# Patient Record
Sex: Male | Born: 1975 | Race: Black or African American | Hispanic: No | Marital: Married
Health system: Southern US, Academic
[De-identification: ages and names within clinical notes are randomized; demographics above are authoritative.]

## PROBLEM LIST (undated history)

## (undated) ENCOUNTER — Encounter

## (undated) ENCOUNTER — Encounter: Attending: Nephrology | Primary: Nephrology

## (undated) ENCOUNTER — Ambulatory Visit

## (undated) ENCOUNTER — Telehealth

## (undated) ENCOUNTER — Ambulatory Visit: Payer: MEDICARE | Attending: Nephrology | Primary: Nephrology

## (undated) ENCOUNTER — Ambulatory Visit: Payer: MEDICARE

## (undated) ENCOUNTER — Encounter: Payer: MEDICARE | Attending: Nephrology | Primary: Nephrology

## (undated) ENCOUNTER — Ambulatory Visit
Payer: Medicare (Managed Care) | Attending: Student in an Organized Health Care Education/Training Program | Primary: Student in an Organized Health Care Education/Training Program

## (undated) ENCOUNTER — Ambulatory Visit: Payer: Medicare (Managed Care)

## (undated) ENCOUNTER — Ambulatory Visit: Payer: Medicare (Managed Care) | Attending: Nephrology | Primary: Nephrology

## (undated) DIAGNOSIS — E119 Type 2 diabetes mellitus without complications: Secondary | ICD-10-CM

## (undated) DIAGNOSIS — H409 Unspecified glaucoma: Secondary | ICD-10-CM

## (undated) DIAGNOSIS — K219 Gastro-esophageal reflux disease without esophagitis: Secondary | ICD-10-CM

## (undated) DIAGNOSIS — I2510 Atherosclerotic heart disease of native coronary artery without angina pectoris: Secondary | ICD-10-CM

## (undated) DIAGNOSIS — N189 Chronic kidney disease, unspecified: Secondary | ICD-10-CM

## (undated) DIAGNOSIS — I10 Essential (primary) hypertension: Secondary | ICD-10-CM

## (undated) DIAGNOSIS — E663 Overweight: Secondary | ICD-10-CM

## (undated) DIAGNOSIS — H547 Unspecified visual loss: Secondary | ICD-10-CM

## (undated) HISTORY — DX: Atherosclerotic heart disease of native coronary artery without angina pectoris: I25.10

## (undated) HISTORY — DX: Chronic kidney disease, unspecified: N18.9

## (undated) HISTORY — DX: Unspecified visual loss: H54.7

## (undated) HISTORY — DX: Overweight: E66.3

## (undated) HISTORY — DX: Gastro-esophageal reflux disease without esophagitis: K21.9

## (undated) HISTORY — PX: EYE SURGERY: SHX253

## (undated) HISTORY — DX: Essential (primary) hypertension: I10

## (undated) HISTORY — DX: Unspecified glaucoma: H40.9

## (undated) HISTORY — DX: Type 2 diabetes mellitus without complications: E11.9

## (undated) HISTORY — PX: CARDIAC CATHETERIZATION: SHX172

---

## 2009-12-25 DIAGNOSIS — I10 Essential (primary) hypertension: Secondary | ICD-10-CM | POA: Insufficient documentation

## 2011-01-05 DIAGNOSIS — E119 Type 2 diabetes mellitus without complications: Secondary | ICD-10-CM | POA: Insufficient documentation

## 2011-08-16 DIAGNOSIS — R946 Abnormal results of thyroid function studies: Secondary | ICD-10-CM | POA: Insufficient documentation

## 2011-08-16 HISTORY — DX: Abnormal results of thyroid function studies: R94.6

## 2014-04-19 DIAGNOSIS — N2581 Secondary hyperparathyroidism of renal origin: Secondary | ICD-10-CM | POA: Diagnosis not present

## 2014-04-19 DIAGNOSIS — N186 End stage renal disease: Secondary | ICD-10-CM | POA: Diagnosis not present

## 2014-04-19 DIAGNOSIS — D631 Anemia in chronic kidney disease: Secondary | ICD-10-CM | POA: Diagnosis not present

## 2014-04-19 DIAGNOSIS — D509 Iron deficiency anemia, unspecified: Secondary | ICD-10-CM | POA: Diagnosis not present

## 2014-04-19 DIAGNOSIS — Z992 Dependence on renal dialysis: Secondary | ICD-10-CM | POA: Diagnosis not present

## 2014-04-22 DIAGNOSIS — D631 Anemia in chronic kidney disease: Secondary | ICD-10-CM | POA: Diagnosis not present

## 2014-04-22 DIAGNOSIS — N2581 Secondary hyperparathyroidism of renal origin: Secondary | ICD-10-CM | POA: Diagnosis not present

## 2014-04-22 DIAGNOSIS — D509 Iron deficiency anemia, unspecified: Secondary | ICD-10-CM | POA: Diagnosis not present

## 2014-04-22 DIAGNOSIS — N186 End stage renal disease: Secondary | ICD-10-CM | POA: Diagnosis not present

## 2014-04-22 DIAGNOSIS — Z992 Dependence on renal dialysis: Secondary | ICD-10-CM | POA: Diagnosis not present

## 2014-04-24 DIAGNOSIS — N2581 Secondary hyperparathyroidism of renal origin: Secondary | ICD-10-CM | POA: Diagnosis not present

## 2014-04-24 DIAGNOSIS — Z992 Dependence on renal dialysis: Secondary | ICD-10-CM | POA: Diagnosis not present

## 2014-04-24 DIAGNOSIS — D631 Anemia in chronic kidney disease: Secondary | ICD-10-CM | POA: Diagnosis not present

## 2014-04-24 DIAGNOSIS — N186 End stage renal disease: Secondary | ICD-10-CM | POA: Diagnosis not present

## 2014-04-24 DIAGNOSIS — D509 Iron deficiency anemia, unspecified: Secondary | ICD-10-CM | POA: Diagnosis not present

## 2014-04-26 DIAGNOSIS — D509 Iron deficiency anemia, unspecified: Secondary | ICD-10-CM | POA: Diagnosis not present

## 2014-04-26 DIAGNOSIS — Z992 Dependence on renal dialysis: Secondary | ICD-10-CM | POA: Diagnosis not present

## 2014-04-26 DIAGNOSIS — D631 Anemia in chronic kidney disease: Secondary | ICD-10-CM | POA: Diagnosis not present

## 2014-04-26 DIAGNOSIS — N186 End stage renal disease: Secondary | ICD-10-CM | POA: Diagnosis not present

## 2014-04-26 DIAGNOSIS — N2581 Secondary hyperparathyroidism of renal origin: Secondary | ICD-10-CM | POA: Diagnosis not present

## 2014-04-29 DIAGNOSIS — N186 End stage renal disease: Secondary | ICD-10-CM | POA: Diagnosis not present

## 2014-04-29 DIAGNOSIS — D509 Iron deficiency anemia, unspecified: Secondary | ICD-10-CM | POA: Diagnosis not present

## 2014-04-29 DIAGNOSIS — D631 Anemia in chronic kidney disease: Secondary | ICD-10-CM | POA: Diagnosis not present

## 2014-04-29 DIAGNOSIS — Z992 Dependence on renal dialysis: Secondary | ICD-10-CM | POA: Diagnosis not present

## 2014-04-29 DIAGNOSIS — N2581 Secondary hyperparathyroidism of renal origin: Secondary | ICD-10-CM | POA: Diagnosis not present

## 2014-05-01 DIAGNOSIS — N2581 Secondary hyperparathyroidism of renal origin: Secondary | ICD-10-CM | POA: Diagnosis not present

## 2014-05-01 DIAGNOSIS — Z992 Dependence on renal dialysis: Secondary | ICD-10-CM | POA: Diagnosis not present

## 2014-05-01 DIAGNOSIS — N186 End stage renal disease: Secondary | ICD-10-CM | POA: Diagnosis not present

## 2014-05-01 DIAGNOSIS — D631 Anemia in chronic kidney disease: Secondary | ICD-10-CM | POA: Diagnosis not present

## 2014-05-01 DIAGNOSIS — D509 Iron deficiency anemia, unspecified: Secondary | ICD-10-CM | POA: Diagnosis not present

## 2014-05-03 DIAGNOSIS — D631 Anemia in chronic kidney disease: Secondary | ICD-10-CM | POA: Diagnosis not present

## 2014-05-03 DIAGNOSIS — D509 Iron deficiency anemia, unspecified: Secondary | ICD-10-CM | POA: Diagnosis not present

## 2014-05-03 DIAGNOSIS — N2581 Secondary hyperparathyroidism of renal origin: Secondary | ICD-10-CM | POA: Diagnosis not present

## 2014-05-03 DIAGNOSIS — N186 End stage renal disease: Secondary | ICD-10-CM | POA: Diagnosis not present

## 2014-05-03 DIAGNOSIS — Z992 Dependence on renal dialysis: Secondary | ICD-10-CM | POA: Diagnosis not present

## 2014-05-06 DIAGNOSIS — D631 Anemia in chronic kidney disease: Secondary | ICD-10-CM | POA: Diagnosis not present

## 2014-05-06 DIAGNOSIS — N2581 Secondary hyperparathyroidism of renal origin: Secondary | ICD-10-CM | POA: Diagnosis not present

## 2014-05-06 DIAGNOSIS — Z992 Dependence on renal dialysis: Secondary | ICD-10-CM | POA: Diagnosis not present

## 2014-05-06 DIAGNOSIS — N186 End stage renal disease: Secondary | ICD-10-CM | POA: Diagnosis not present

## 2014-05-06 DIAGNOSIS — D509 Iron deficiency anemia, unspecified: Secondary | ICD-10-CM | POA: Diagnosis not present

## 2014-05-10 DIAGNOSIS — D631 Anemia in chronic kidney disease: Secondary | ICD-10-CM | POA: Diagnosis not present

## 2014-05-10 DIAGNOSIS — D509 Iron deficiency anemia, unspecified: Secondary | ICD-10-CM | POA: Diagnosis not present

## 2014-05-10 DIAGNOSIS — N186 End stage renal disease: Secondary | ICD-10-CM | POA: Diagnosis not present

## 2014-05-10 DIAGNOSIS — Z992 Dependence on renal dialysis: Secondary | ICD-10-CM | POA: Diagnosis not present

## 2014-05-10 DIAGNOSIS — N2581 Secondary hyperparathyroidism of renal origin: Secondary | ICD-10-CM | POA: Diagnosis not present

## 2014-05-13 DIAGNOSIS — N2581 Secondary hyperparathyroidism of renal origin: Secondary | ICD-10-CM | POA: Diagnosis not present

## 2014-05-13 DIAGNOSIS — D509 Iron deficiency anemia, unspecified: Secondary | ICD-10-CM | POA: Diagnosis not present

## 2014-05-13 DIAGNOSIS — D631 Anemia in chronic kidney disease: Secondary | ICD-10-CM | POA: Diagnosis not present

## 2014-05-13 DIAGNOSIS — N186 End stage renal disease: Secondary | ICD-10-CM | POA: Diagnosis not present

## 2014-05-13 DIAGNOSIS — Z992 Dependence on renal dialysis: Secondary | ICD-10-CM | POA: Diagnosis not present

## 2014-05-14 DIAGNOSIS — H25042 Posterior subcapsular polar age-related cataract, left eye: Secondary | ICD-10-CM | POA: Diagnosis not present

## 2014-05-14 DIAGNOSIS — E10349 Type 1 diabetes mellitus with severe nonproliferative diabetic retinopathy without macular edema: Secondary | ICD-10-CM | POA: Diagnosis not present

## 2014-05-14 DIAGNOSIS — H1789 Other corneal scars and opacities: Secondary | ICD-10-CM | POA: Diagnosis not present

## 2014-05-15 DIAGNOSIS — N186 End stage renal disease: Secondary | ICD-10-CM | POA: Diagnosis not present

## 2014-05-15 DIAGNOSIS — Z992 Dependence on renal dialysis: Secondary | ICD-10-CM | POA: Diagnosis not present

## 2014-05-15 DIAGNOSIS — N2581 Secondary hyperparathyroidism of renal origin: Secondary | ICD-10-CM | POA: Diagnosis not present

## 2014-05-15 DIAGNOSIS — D509 Iron deficiency anemia, unspecified: Secondary | ICD-10-CM | POA: Diagnosis not present

## 2014-05-15 DIAGNOSIS — D631 Anemia in chronic kidney disease: Secondary | ICD-10-CM | POA: Diagnosis not present

## 2014-05-17 DIAGNOSIS — D631 Anemia in chronic kidney disease: Secondary | ICD-10-CM | POA: Diagnosis not present

## 2014-05-17 DIAGNOSIS — D509 Iron deficiency anemia, unspecified: Secondary | ICD-10-CM | POA: Diagnosis not present

## 2014-05-17 DIAGNOSIS — N186 End stage renal disease: Secondary | ICD-10-CM | POA: Diagnosis not present

## 2014-05-17 DIAGNOSIS — N2581 Secondary hyperparathyroidism of renal origin: Secondary | ICD-10-CM | POA: Diagnosis not present

## 2014-05-17 DIAGNOSIS — Z992 Dependence on renal dialysis: Secondary | ICD-10-CM | POA: Diagnosis not present

## 2014-05-20 DIAGNOSIS — N2581 Secondary hyperparathyroidism of renal origin: Secondary | ICD-10-CM | POA: Diagnosis not present

## 2014-05-20 DIAGNOSIS — D509 Iron deficiency anemia, unspecified: Secondary | ICD-10-CM | POA: Diagnosis not present

## 2014-05-20 DIAGNOSIS — E11359 Type 2 diabetes mellitus with proliferative diabetic retinopathy without macular edema: Secondary | ICD-10-CM | POA: Diagnosis not present

## 2014-05-20 DIAGNOSIS — H44521 Atrophy of globe, right eye: Secondary | ICD-10-CM | POA: Diagnosis not present

## 2014-05-20 DIAGNOSIS — D631 Anemia in chronic kidney disease: Secondary | ICD-10-CM | POA: Diagnosis not present

## 2014-05-20 DIAGNOSIS — Z992 Dependence on renal dialysis: Secondary | ICD-10-CM | POA: Diagnosis not present

## 2014-05-20 DIAGNOSIS — N186 End stage renal disease: Secondary | ICD-10-CM | POA: Diagnosis not present

## 2014-05-20 DIAGNOSIS — H3342 Traction detachment of retina, left eye: Secondary | ICD-10-CM | POA: Diagnosis not present

## 2014-05-22 DIAGNOSIS — Z992 Dependence on renal dialysis: Secondary | ICD-10-CM | POA: Diagnosis not present

## 2014-05-22 DIAGNOSIS — N186 End stage renal disease: Secondary | ICD-10-CM | POA: Diagnosis not present

## 2014-05-22 DIAGNOSIS — D509 Iron deficiency anemia, unspecified: Secondary | ICD-10-CM | POA: Diagnosis not present

## 2014-05-22 DIAGNOSIS — N2581 Secondary hyperparathyroidism of renal origin: Secondary | ICD-10-CM | POA: Diagnosis not present

## 2014-05-22 DIAGNOSIS — D631 Anemia in chronic kidney disease: Secondary | ICD-10-CM | POA: Diagnosis not present

## 2014-05-24 DIAGNOSIS — N186 End stage renal disease: Secondary | ICD-10-CM | POA: Diagnosis not present

## 2014-05-24 DIAGNOSIS — Z992 Dependence on renal dialysis: Secondary | ICD-10-CM | POA: Diagnosis not present

## 2014-05-24 DIAGNOSIS — D631 Anemia in chronic kidney disease: Secondary | ICD-10-CM | POA: Diagnosis not present

## 2014-05-24 DIAGNOSIS — N2581 Secondary hyperparathyroidism of renal origin: Secondary | ICD-10-CM | POA: Diagnosis not present

## 2014-05-24 DIAGNOSIS — D509 Iron deficiency anemia, unspecified: Secondary | ICD-10-CM | POA: Diagnosis not present

## 2014-05-27 DIAGNOSIS — N186 End stage renal disease: Secondary | ICD-10-CM | POA: Diagnosis not present

## 2014-05-27 DIAGNOSIS — Z992 Dependence on renal dialysis: Secondary | ICD-10-CM | POA: Diagnosis not present

## 2014-05-27 DIAGNOSIS — N2581 Secondary hyperparathyroidism of renal origin: Secondary | ICD-10-CM | POA: Diagnosis not present

## 2014-05-27 DIAGNOSIS — D631 Anemia in chronic kidney disease: Secondary | ICD-10-CM | POA: Diagnosis not present

## 2014-05-27 DIAGNOSIS — D509 Iron deficiency anemia, unspecified: Secondary | ICD-10-CM | POA: Diagnosis not present

## 2014-05-29 DIAGNOSIS — N186 End stage renal disease: Secondary | ICD-10-CM | POA: Diagnosis not present

## 2014-05-29 DIAGNOSIS — Z992 Dependence on renal dialysis: Secondary | ICD-10-CM | POA: Diagnosis not present

## 2014-05-29 DIAGNOSIS — D509 Iron deficiency anemia, unspecified: Secondary | ICD-10-CM | POA: Diagnosis not present

## 2014-05-29 DIAGNOSIS — D631 Anemia in chronic kidney disease: Secondary | ICD-10-CM | POA: Diagnosis not present

## 2014-05-29 DIAGNOSIS — N2581 Secondary hyperparathyroidism of renal origin: Secondary | ICD-10-CM | POA: Diagnosis not present

## 2014-05-31 DIAGNOSIS — N2581 Secondary hyperparathyroidism of renal origin: Secondary | ICD-10-CM | POA: Diagnosis not present

## 2014-05-31 DIAGNOSIS — N186 End stage renal disease: Secondary | ICD-10-CM | POA: Diagnosis not present

## 2014-05-31 DIAGNOSIS — D509 Iron deficiency anemia, unspecified: Secondary | ICD-10-CM | POA: Diagnosis not present

## 2014-05-31 DIAGNOSIS — Z992 Dependence on renal dialysis: Secondary | ICD-10-CM | POA: Diagnosis not present

## 2014-05-31 DIAGNOSIS — D631 Anemia in chronic kidney disease: Secondary | ICD-10-CM | POA: Diagnosis not present

## 2014-06-03 DIAGNOSIS — Z992 Dependence on renal dialysis: Secondary | ICD-10-CM | POA: Diagnosis not present

## 2014-06-03 DIAGNOSIS — D509 Iron deficiency anemia, unspecified: Secondary | ICD-10-CM | POA: Diagnosis not present

## 2014-06-03 DIAGNOSIS — N186 End stage renal disease: Secondary | ICD-10-CM | POA: Diagnosis not present

## 2014-06-03 DIAGNOSIS — D631 Anemia in chronic kidney disease: Secondary | ICD-10-CM | POA: Diagnosis not present

## 2014-06-03 DIAGNOSIS — N2581 Secondary hyperparathyroidism of renal origin: Secondary | ICD-10-CM | POA: Diagnosis not present

## 2014-06-05 DIAGNOSIS — Z992 Dependence on renal dialysis: Secondary | ICD-10-CM | POA: Diagnosis not present

## 2014-06-05 DIAGNOSIS — D509 Iron deficiency anemia, unspecified: Secondary | ICD-10-CM | POA: Diagnosis not present

## 2014-06-05 DIAGNOSIS — N186 End stage renal disease: Secondary | ICD-10-CM | POA: Diagnosis not present

## 2014-06-05 DIAGNOSIS — N2581 Secondary hyperparathyroidism of renal origin: Secondary | ICD-10-CM | POA: Diagnosis not present

## 2014-06-05 DIAGNOSIS — D631 Anemia in chronic kidney disease: Secondary | ICD-10-CM | POA: Diagnosis not present

## 2014-06-06 DIAGNOSIS — N186 End stage renal disease: Secondary | ICD-10-CM | POA: Diagnosis not present

## 2014-06-06 DIAGNOSIS — E119 Type 2 diabetes mellitus without complications: Secondary | ICD-10-CM | POA: Diagnosis not present

## 2014-06-06 DIAGNOSIS — I2510 Atherosclerotic heart disease of native coronary artery without angina pectoris: Secondary | ICD-10-CM | POA: Diagnosis not present

## 2014-06-06 DIAGNOSIS — Z1159 Encounter for screening for other viral diseases: Secondary | ICD-10-CM | POA: Diagnosis not present

## 2014-06-06 DIAGNOSIS — I10 Essential (primary) hypertension: Secondary | ICD-10-CM | POA: Diagnosis not present

## 2014-06-07 DIAGNOSIS — Z992 Dependence on renal dialysis: Secondary | ICD-10-CM | POA: Diagnosis not present

## 2014-06-07 DIAGNOSIS — D631 Anemia in chronic kidney disease: Secondary | ICD-10-CM | POA: Diagnosis not present

## 2014-06-07 DIAGNOSIS — N186 End stage renal disease: Secondary | ICD-10-CM | POA: Diagnosis not present

## 2014-06-07 DIAGNOSIS — N2581 Secondary hyperparathyroidism of renal origin: Secondary | ICD-10-CM | POA: Diagnosis not present

## 2014-06-07 DIAGNOSIS — D509 Iron deficiency anemia, unspecified: Secondary | ICD-10-CM | POA: Diagnosis not present

## 2014-06-10 DIAGNOSIS — D509 Iron deficiency anemia, unspecified: Secondary | ICD-10-CM | POA: Diagnosis not present

## 2014-06-10 DIAGNOSIS — D631 Anemia in chronic kidney disease: Secondary | ICD-10-CM | POA: Diagnosis not present

## 2014-06-10 DIAGNOSIS — N2581 Secondary hyperparathyroidism of renal origin: Secondary | ICD-10-CM | POA: Diagnosis not present

## 2014-06-10 DIAGNOSIS — Z992 Dependence on renal dialysis: Secondary | ICD-10-CM | POA: Diagnosis not present

## 2014-06-10 DIAGNOSIS — N186 End stage renal disease: Secondary | ICD-10-CM | POA: Diagnosis not present

## 2014-06-12 DIAGNOSIS — Z992 Dependence on renal dialysis: Secondary | ICD-10-CM | POA: Diagnosis not present

## 2014-06-12 DIAGNOSIS — D509 Iron deficiency anemia, unspecified: Secondary | ICD-10-CM | POA: Diagnosis not present

## 2014-06-12 DIAGNOSIS — N186 End stage renal disease: Secondary | ICD-10-CM | POA: Diagnosis not present

## 2014-06-12 DIAGNOSIS — D631 Anemia in chronic kidney disease: Secondary | ICD-10-CM | POA: Diagnosis not present

## 2014-06-12 DIAGNOSIS — N2581 Secondary hyperparathyroidism of renal origin: Secondary | ICD-10-CM | POA: Diagnosis not present

## 2014-06-14 DIAGNOSIS — Z992 Dependence on renal dialysis: Secondary | ICD-10-CM | POA: Diagnosis not present

## 2014-06-14 DIAGNOSIS — D631 Anemia in chronic kidney disease: Secondary | ICD-10-CM | POA: Diagnosis not present

## 2014-06-14 DIAGNOSIS — D509 Iron deficiency anemia, unspecified: Secondary | ICD-10-CM | POA: Diagnosis not present

## 2014-06-14 DIAGNOSIS — N2581 Secondary hyperparathyroidism of renal origin: Secondary | ICD-10-CM | POA: Diagnosis not present

## 2014-06-14 DIAGNOSIS — N186 End stage renal disease: Secondary | ICD-10-CM | POA: Diagnosis not present

## 2014-06-17 DIAGNOSIS — N2581 Secondary hyperparathyroidism of renal origin: Secondary | ICD-10-CM | POA: Diagnosis not present

## 2014-06-17 DIAGNOSIS — D631 Anemia in chronic kidney disease: Secondary | ICD-10-CM | POA: Diagnosis not present

## 2014-06-17 DIAGNOSIS — Z992 Dependence on renal dialysis: Secondary | ICD-10-CM | POA: Diagnosis not present

## 2014-06-17 DIAGNOSIS — D509 Iron deficiency anemia, unspecified: Secondary | ICD-10-CM | POA: Diagnosis not present

## 2014-06-17 DIAGNOSIS — N186 End stage renal disease: Secondary | ICD-10-CM | POA: Diagnosis not present

## 2014-06-18 DIAGNOSIS — Z992 Dependence on renal dialysis: Secondary | ICD-10-CM | POA: Diagnosis not present

## 2014-06-18 DIAGNOSIS — N186 End stage renal disease: Secondary | ICD-10-CM | POA: Diagnosis not present

## 2014-06-19 DIAGNOSIS — Z992 Dependence on renal dialysis: Secondary | ICD-10-CM | POA: Diagnosis not present

## 2014-06-19 DIAGNOSIS — N2581 Secondary hyperparathyroidism of renal origin: Secondary | ICD-10-CM | POA: Diagnosis not present

## 2014-06-19 DIAGNOSIS — D631 Anemia in chronic kidney disease: Secondary | ICD-10-CM | POA: Diagnosis not present

## 2014-06-19 DIAGNOSIS — N186 End stage renal disease: Secondary | ICD-10-CM | POA: Diagnosis not present

## 2014-06-21 DIAGNOSIS — D631 Anemia in chronic kidney disease: Secondary | ICD-10-CM | POA: Diagnosis not present

## 2014-06-21 DIAGNOSIS — N2581 Secondary hyperparathyroidism of renal origin: Secondary | ICD-10-CM | POA: Diagnosis not present

## 2014-06-21 DIAGNOSIS — N186 End stage renal disease: Secondary | ICD-10-CM | POA: Diagnosis not present

## 2014-06-21 DIAGNOSIS — Z992 Dependence on renal dialysis: Secondary | ICD-10-CM | POA: Diagnosis not present

## 2014-06-24 DIAGNOSIS — D631 Anemia in chronic kidney disease: Secondary | ICD-10-CM | POA: Diagnosis not present

## 2014-06-24 DIAGNOSIS — N2581 Secondary hyperparathyroidism of renal origin: Secondary | ICD-10-CM | POA: Diagnosis not present

## 2014-06-24 DIAGNOSIS — Z992 Dependence on renal dialysis: Secondary | ICD-10-CM | POA: Diagnosis not present

## 2014-06-24 DIAGNOSIS — N186 End stage renal disease: Secondary | ICD-10-CM | POA: Diagnosis not present

## 2014-06-26 DIAGNOSIS — D631 Anemia in chronic kidney disease: Secondary | ICD-10-CM | POA: Diagnosis not present

## 2014-06-26 DIAGNOSIS — Z992 Dependence on renal dialysis: Secondary | ICD-10-CM | POA: Diagnosis not present

## 2014-06-26 DIAGNOSIS — N2581 Secondary hyperparathyroidism of renal origin: Secondary | ICD-10-CM | POA: Diagnosis not present

## 2014-06-26 DIAGNOSIS — N186 End stage renal disease: Secondary | ICD-10-CM | POA: Diagnosis not present

## 2014-06-28 DIAGNOSIS — D631 Anemia in chronic kidney disease: Secondary | ICD-10-CM | POA: Diagnosis not present

## 2014-06-28 DIAGNOSIS — N186 End stage renal disease: Secondary | ICD-10-CM | POA: Diagnosis not present

## 2014-06-28 DIAGNOSIS — Z992 Dependence on renal dialysis: Secondary | ICD-10-CM | POA: Diagnosis not present

## 2014-06-28 DIAGNOSIS — N2581 Secondary hyperparathyroidism of renal origin: Secondary | ICD-10-CM | POA: Diagnosis not present

## 2014-07-01 DIAGNOSIS — N186 End stage renal disease: Secondary | ICD-10-CM | POA: Diagnosis not present

## 2014-07-01 DIAGNOSIS — N2581 Secondary hyperparathyroidism of renal origin: Secondary | ICD-10-CM | POA: Diagnosis not present

## 2014-07-01 DIAGNOSIS — D631 Anemia in chronic kidney disease: Secondary | ICD-10-CM | POA: Diagnosis not present

## 2014-07-01 DIAGNOSIS — Z992 Dependence on renal dialysis: Secondary | ICD-10-CM | POA: Diagnosis not present

## 2014-07-03 DIAGNOSIS — N2581 Secondary hyperparathyroidism of renal origin: Secondary | ICD-10-CM | POA: Diagnosis not present

## 2014-07-03 DIAGNOSIS — D631 Anemia in chronic kidney disease: Secondary | ICD-10-CM | POA: Diagnosis not present

## 2014-07-03 DIAGNOSIS — N186 End stage renal disease: Secondary | ICD-10-CM | POA: Diagnosis not present

## 2014-07-03 DIAGNOSIS — Z992 Dependence on renal dialysis: Secondary | ICD-10-CM | POA: Diagnosis not present

## 2014-07-04 DIAGNOSIS — I10 Essential (primary) hypertension: Secondary | ICD-10-CM | POA: Diagnosis not present

## 2014-07-04 DIAGNOSIS — E782 Mixed hyperlipidemia: Secondary | ICD-10-CM | POA: Diagnosis not present

## 2014-07-04 DIAGNOSIS — I2510 Atherosclerotic heart disease of native coronary artery without angina pectoris: Secondary | ICD-10-CM | POA: Diagnosis not present

## 2014-07-05 DIAGNOSIS — D631 Anemia in chronic kidney disease: Secondary | ICD-10-CM | POA: Diagnosis not present

## 2014-07-05 DIAGNOSIS — N186 End stage renal disease: Secondary | ICD-10-CM | POA: Diagnosis not present

## 2014-07-05 DIAGNOSIS — Z992 Dependence on renal dialysis: Secondary | ICD-10-CM | POA: Diagnosis not present

## 2014-07-05 DIAGNOSIS — N2581 Secondary hyperparathyroidism of renal origin: Secondary | ICD-10-CM | POA: Diagnosis not present

## 2014-07-08 DIAGNOSIS — N186 End stage renal disease: Secondary | ICD-10-CM | POA: Diagnosis not present

## 2014-07-08 DIAGNOSIS — N2581 Secondary hyperparathyroidism of renal origin: Secondary | ICD-10-CM | POA: Diagnosis not present

## 2014-07-08 DIAGNOSIS — D631 Anemia in chronic kidney disease: Secondary | ICD-10-CM | POA: Diagnosis not present

## 2014-07-08 DIAGNOSIS — Z992 Dependence on renal dialysis: Secondary | ICD-10-CM | POA: Diagnosis not present

## 2014-07-10 DIAGNOSIS — N2581 Secondary hyperparathyroidism of renal origin: Secondary | ICD-10-CM | POA: Diagnosis not present

## 2014-07-10 DIAGNOSIS — Z992 Dependence on renal dialysis: Secondary | ICD-10-CM | POA: Diagnosis not present

## 2014-07-10 DIAGNOSIS — N186 End stage renal disease: Secondary | ICD-10-CM | POA: Diagnosis not present

## 2014-07-10 DIAGNOSIS — D631 Anemia in chronic kidney disease: Secondary | ICD-10-CM | POA: Diagnosis not present

## 2014-07-11 DIAGNOSIS — I10 Essential (primary) hypertension: Secondary | ICD-10-CM | POA: Diagnosis not present

## 2014-07-11 DIAGNOSIS — E782 Mixed hyperlipidemia: Secondary | ICD-10-CM | POA: Diagnosis not present

## 2014-07-11 DIAGNOSIS — E119 Type 2 diabetes mellitus without complications: Secondary | ICD-10-CM | POA: Diagnosis not present

## 2014-07-11 DIAGNOSIS — N186 End stage renal disease: Secondary | ICD-10-CM | POA: Diagnosis not present

## 2014-07-12 DIAGNOSIS — Z992 Dependence on renal dialysis: Secondary | ICD-10-CM | POA: Diagnosis not present

## 2014-07-12 DIAGNOSIS — N186 End stage renal disease: Secondary | ICD-10-CM | POA: Diagnosis not present

## 2014-07-12 DIAGNOSIS — D631 Anemia in chronic kidney disease: Secondary | ICD-10-CM | POA: Diagnosis not present

## 2014-07-12 DIAGNOSIS — N2581 Secondary hyperparathyroidism of renal origin: Secondary | ICD-10-CM | POA: Diagnosis not present

## 2014-07-15 DIAGNOSIS — N2581 Secondary hyperparathyroidism of renal origin: Secondary | ICD-10-CM | POA: Diagnosis not present

## 2014-07-15 DIAGNOSIS — Z992 Dependence on renal dialysis: Secondary | ICD-10-CM | POA: Diagnosis not present

## 2014-07-15 DIAGNOSIS — D631 Anemia in chronic kidney disease: Secondary | ICD-10-CM | POA: Diagnosis not present

## 2014-07-15 DIAGNOSIS — N186 End stage renal disease: Secondary | ICD-10-CM | POA: Diagnosis not present

## 2014-07-17 DIAGNOSIS — N186 End stage renal disease: Secondary | ICD-10-CM | POA: Diagnosis not present

## 2014-07-17 DIAGNOSIS — Z992 Dependence on renal dialysis: Secondary | ICD-10-CM | POA: Diagnosis not present

## 2014-07-17 DIAGNOSIS — D631 Anemia in chronic kidney disease: Secondary | ICD-10-CM | POA: Diagnosis not present

## 2014-07-17 DIAGNOSIS — N2581 Secondary hyperparathyroidism of renal origin: Secondary | ICD-10-CM | POA: Diagnosis not present

## 2014-07-19 DIAGNOSIS — N186 End stage renal disease: Secondary | ICD-10-CM | POA: Diagnosis not present

## 2014-07-19 DIAGNOSIS — Z992 Dependence on renal dialysis: Secondary | ICD-10-CM | POA: Diagnosis not present

## 2014-07-22 DIAGNOSIS — N2581 Secondary hyperparathyroidism of renal origin: Secondary | ICD-10-CM | POA: Diagnosis not present

## 2014-07-22 DIAGNOSIS — N186 End stage renal disease: Secondary | ICD-10-CM | POA: Diagnosis not present

## 2014-07-22 DIAGNOSIS — D509 Iron deficiency anemia, unspecified: Secondary | ICD-10-CM | POA: Diagnosis not present

## 2014-07-22 DIAGNOSIS — Z992 Dependence on renal dialysis: Secondary | ICD-10-CM | POA: Diagnosis not present

## 2014-07-22 DIAGNOSIS — D631 Anemia in chronic kidney disease: Secondary | ICD-10-CM | POA: Diagnosis not present

## 2014-07-24 DIAGNOSIS — D509 Iron deficiency anemia, unspecified: Secondary | ICD-10-CM | POA: Diagnosis not present

## 2014-07-24 DIAGNOSIS — D631 Anemia in chronic kidney disease: Secondary | ICD-10-CM | POA: Diagnosis not present

## 2014-07-24 DIAGNOSIS — Z992 Dependence on renal dialysis: Secondary | ICD-10-CM | POA: Diagnosis not present

## 2014-07-24 DIAGNOSIS — N186 End stage renal disease: Secondary | ICD-10-CM | POA: Diagnosis not present

## 2014-07-24 DIAGNOSIS — N2581 Secondary hyperparathyroidism of renal origin: Secondary | ICD-10-CM | POA: Diagnosis not present

## 2014-07-26 DIAGNOSIS — N2581 Secondary hyperparathyroidism of renal origin: Secondary | ICD-10-CM | POA: Diagnosis not present

## 2014-07-26 DIAGNOSIS — D631 Anemia in chronic kidney disease: Secondary | ICD-10-CM | POA: Diagnosis not present

## 2014-07-26 DIAGNOSIS — D509 Iron deficiency anemia, unspecified: Secondary | ICD-10-CM | POA: Diagnosis not present

## 2014-07-26 DIAGNOSIS — Z992 Dependence on renal dialysis: Secondary | ICD-10-CM | POA: Diagnosis not present

## 2014-07-26 DIAGNOSIS — N186 End stage renal disease: Secondary | ICD-10-CM | POA: Diagnosis not present

## 2014-07-29 DIAGNOSIS — D509 Iron deficiency anemia, unspecified: Secondary | ICD-10-CM | POA: Diagnosis not present

## 2014-07-29 DIAGNOSIS — Z992 Dependence on renal dialysis: Secondary | ICD-10-CM | POA: Diagnosis not present

## 2014-07-29 DIAGNOSIS — D631 Anemia in chronic kidney disease: Secondary | ICD-10-CM | POA: Diagnosis not present

## 2014-07-29 DIAGNOSIS — N186 End stage renal disease: Secondary | ICD-10-CM | POA: Diagnosis not present

## 2014-07-29 DIAGNOSIS — N2581 Secondary hyperparathyroidism of renal origin: Secondary | ICD-10-CM | POA: Diagnosis not present

## 2014-07-31 DIAGNOSIS — N186 End stage renal disease: Secondary | ICD-10-CM | POA: Diagnosis not present

## 2014-07-31 DIAGNOSIS — Z992 Dependence on renal dialysis: Secondary | ICD-10-CM | POA: Diagnosis not present

## 2014-07-31 DIAGNOSIS — D509 Iron deficiency anemia, unspecified: Secondary | ICD-10-CM | POA: Diagnosis not present

## 2014-07-31 DIAGNOSIS — D631 Anemia in chronic kidney disease: Secondary | ICD-10-CM | POA: Diagnosis not present

## 2014-07-31 DIAGNOSIS — N2581 Secondary hyperparathyroidism of renal origin: Secondary | ICD-10-CM | POA: Diagnosis not present

## 2014-08-02 DIAGNOSIS — Z992 Dependence on renal dialysis: Secondary | ICD-10-CM | POA: Diagnosis not present

## 2014-08-02 DIAGNOSIS — D509 Iron deficiency anemia, unspecified: Secondary | ICD-10-CM | POA: Diagnosis not present

## 2014-08-02 DIAGNOSIS — N186 End stage renal disease: Secondary | ICD-10-CM | POA: Diagnosis not present

## 2014-08-02 DIAGNOSIS — N2581 Secondary hyperparathyroidism of renal origin: Secondary | ICD-10-CM | POA: Diagnosis not present

## 2014-08-02 DIAGNOSIS — D631 Anemia in chronic kidney disease: Secondary | ICD-10-CM | POA: Diagnosis not present

## 2014-08-05 DIAGNOSIS — N2581 Secondary hyperparathyroidism of renal origin: Secondary | ICD-10-CM | POA: Diagnosis not present

## 2014-08-05 DIAGNOSIS — D509 Iron deficiency anemia, unspecified: Secondary | ICD-10-CM | POA: Diagnosis not present

## 2014-08-05 DIAGNOSIS — D631 Anemia in chronic kidney disease: Secondary | ICD-10-CM | POA: Diagnosis not present

## 2014-08-05 DIAGNOSIS — Z992 Dependence on renal dialysis: Secondary | ICD-10-CM | POA: Diagnosis not present

## 2014-08-05 DIAGNOSIS — N186 End stage renal disease: Secondary | ICD-10-CM | POA: Diagnosis not present

## 2014-08-07 DIAGNOSIS — N2581 Secondary hyperparathyroidism of renal origin: Secondary | ICD-10-CM | POA: Diagnosis not present

## 2014-08-07 DIAGNOSIS — N186 End stage renal disease: Secondary | ICD-10-CM | POA: Diagnosis not present

## 2014-08-07 DIAGNOSIS — D509 Iron deficiency anemia, unspecified: Secondary | ICD-10-CM | POA: Diagnosis not present

## 2014-08-07 DIAGNOSIS — I2510 Atherosclerotic heart disease of native coronary artery without angina pectoris: Secondary | ICD-10-CM | POA: Diagnosis not present

## 2014-08-07 DIAGNOSIS — Z992 Dependence on renal dialysis: Secondary | ICD-10-CM | POA: Diagnosis not present

## 2014-08-07 DIAGNOSIS — D631 Anemia in chronic kidney disease: Secondary | ICD-10-CM | POA: Diagnosis not present

## 2014-08-09 DIAGNOSIS — Z992 Dependence on renal dialysis: Secondary | ICD-10-CM | POA: Diagnosis not present

## 2014-08-09 DIAGNOSIS — D631 Anemia in chronic kidney disease: Secondary | ICD-10-CM | POA: Diagnosis not present

## 2014-08-09 DIAGNOSIS — D509 Iron deficiency anemia, unspecified: Secondary | ICD-10-CM | POA: Diagnosis not present

## 2014-08-09 DIAGNOSIS — N186 End stage renal disease: Secondary | ICD-10-CM | POA: Diagnosis not present

## 2014-08-09 DIAGNOSIS — N2581 Secondary hyperparathyroidism of renal origin: Secondary | ICD-10-CM | POA: Diagnosis not present

## 2014-08-12 DIAGNOSIS — Z992 Dependence on renal dialysis: Secondary | ICD-10-CM | POA: Diagnosis not present

## 2014-08-12 DIAGNOSIS — N186 End stage renal disease: Secondary | ICD-10-CM | POA: Diagnosis not present

## 2014-08-12 DIAGNOSIS — D509 Iron deficiency anemia, unspecified: Secondary | ICD-10-CM | POA: Diagnosis not present

## 2014-08-12 DIAGNOSIS — D631 Anemia in chronic kidney disease: Secondary | ICD-10-CM | POA: Diagnosis not present

## 2014-08-12 DIAGNOSIS — N2581 Secondary hyperparathyroidism of renal origin: Secondary | ICD-10-CM | POA: Diagnosis not present

## 2014-08-14 DIAGNOSIS — Z992 Dependence on renal dialysis: Secondary | ICD-10-CM | POA: Diagnosis not present

## 2014-08-14 DIAGNOSIS — D509 Iron deficiency anemia, unspecified: Secondary | ICD-10-CM | POA: Diagnosis not present

## 2014-08-14 DIAGNOSIS — N186 End stage renal disease: Secondary | ICD-10-CM | POA: Diagnosis not present

## 2014-08-14 DIAGNOSIS — D631 Anemia in chronic kidney disease: Secondary | ICD-10-CM | POA: Diagnosis not present

## 2014-08-14 DIAGNOSIS — N2581 Secondary hyperparathyroidism of renal origin: Secondary | ICD-10-CM | POA: Diagnosis not present

## 2014-08-15 DIAGNOSIS — E782 Mixed hyperlipidemia: Secondary | ICD-10-CM | POA: Diagnosis not present

## 2014-08-15 DIAGNOSIS — N186 End stage renal disease: Secondary | ICD-10-CM | POA: Diagnosis not present

## 2014-08-15 DIAGNOSIS — E119 Type 2 diabetes mellitus without complications: Secondary | ICD-10-CM | POA: Diagnosis not present

## 2014-08-15 DIAGNOSIS — I2510 Atherosclerotic heart disease of native coronary artery without angina pectoris: Secondary | ICD-10-CM | POA: Diagnosis not present

## 2014-08-16 DIAGNOSIS — D631 Anemia in chronic kidney disease: Secondary | ICD-10-CM | POA: Diagnosis not present

## 2014-08-16 DIAGNOSIS — N186 End stage renal disease: Secondary | ICD-10-CM | POA: Diagnosis not present

## 2014-08-16 DIAGNOSIS — N2581 Secondary hyperparathyroidism of renal origin: Secondary | ICD-10-CM | POA: Diagnosis not present

## 2014-08-16 DIAGNOSIS — D509 Iron deficiency anemia, unspecified: Secondary | ICD-10-CM | POA: Diagnosis not present

## 2014-08-16 DIAGNOSIS — Z992 Dependence on renal dialysis: Secondary | ICD-10-CM | POA: Diagnosis not present

## 2014-08-18 DIAGNOSIS — Z992 Dependence on renal dialysis: Secondary | ICD-10-CM | POA: Diagnosis not present

## 2014-08-18 DIAGNOSIS — N186 End stage renal disease: Secondary | ICD-10-CM | POA: Diagnosis not present

## 2014-08-19 DIAGNOSIS — N186 End stage renal disease: Secondary | ICD-10-CM | POA: Diagnosis not present

## 2014-08-19 DIAGNOSIS — E11359 Type 2 diabetes mellitus with proliferative diabetic retinopathy without macular edema: Secondary | ICD-10-CM | POA: Diagnosis not present

## 2014-08-19 DIAGNOSIS — D631 Anemia in chronic kidney disease: Secondary | ICD-10-CM | POA: Diagnosis not present

## 2014-08-19 DIAGNOSIS — Z992 Dependence on renal dialysis: Secondary | ICD-10-CM | POA: Diagnosis not present

## 2014-08-19 DIAGNOSIS — N2581 Secondary hyperparathyroidism of renal origin: Secondary | ICD-10-CM | POA: Diagnosis not present

## 2014-08-19 DIAGNOSIS — D509 Iron deficiency anemia, unspecified: Secondary | ICD-10-CM | POA: Diagnosis not present

## 2014-08-20 DIAGNOSIS — I2510 Atherosclerotic heart disease of native coronary artery without angina pectoris: Secondary | ICD-10-CM | POA: Diagnosis not present

## 2014-08-20 DIAGNOSIS — E782 Mixed hyperlipidemia: Secondary | ICD-10-CM | POA: Diagnosis not present

## 2014-08-20 DIAGNOSIS — Z01810 Encounter for preprocedural cardiovascular examination: Secondary | ICD-10-CM | POA: Diagnosis not present

## 2014-08-21 DIAGNOSIS — N186 End stage renal disease: Secondary | ICD-10-CM | POA: Diagnosis not present

## 2014-08-21 DIAGNOSIS — D631 Anemia in chronic kidney disease: Secondary | ICD-10-CM | POA: Diagnosis not present

## 2014-08-21 DIAGNOSIS — N2581 Secondary hyperparathyroidism of renal origin: Secondary | ICD-10-CM | POA: Diagnosis not present

## 2014-08-21 DIAGNOSIS — Z992 Dependence on renal dialysis: Secondary | ICD-10-CM | POA: Diagnosis not present

## 2014-08-21 DIAGNOSIS — D509 Iron deficiency anemia, unspecified: Secondary | ICD-10-CM | POA: Diagnosis not present

## 2014-08-23 DIAGNOSIS — N186 End stage renal disease: Secondary | ICD-10-CM | POA: Diagnosis not present

## 2014-08-23 DIAGNOSIS — D631 Anemia in chronic kidney disease: Secondary | ICD-10-CM | POA: Diagnosis not present

## 2014-08-23 DIAGNOSIS — N2581 Secondary hyperparathyroidism of renal origin: Secondary | ICD-10-CM | POA: Diagnosis not present

## 2014-08-23 DIAGNOSIS — D509 Iron deficiency anemia, unspecified: Secondary | ICD-10-CM | POA: Diagnosis not present

## 2014-08-23 DIAGNOSIS — Z992 Dependence on renal dialysis: Secondary | ICD-10-CM | POA: Diagnosis not present

## 2014-08-26 DIAGNOSIS — N186 End stage renal disease: Secondary | ICD-10-CM | POA: Diagnosis not present

## 2014-08-26 DIAGNOSIS — D631 Anemia in chronic kidney disease: Secondary | ICD-10-CM | POA: Diagnosis not present

## 2014-08-26 DIAGNOSIS — Z992 Dependence on renal dialysis: Secondary | ICD-10-CM | POA: Diagnosis not present

## 2014-08-26 DIAGNOSIS — N2581 Secondary hyperparathyroidism of renal origin: Secondary | ICD-10-CM | POA: Diagnosis not present

## 2014-08-26 DIAGNOSIS — D509 Iron deficiency anemia, unspecified: Secondary | ICD-10-CM | POA: Diagnosis not present

## 2014-08-28 DIAGNOSIS — Z992 Dependence on renal dialysis: Secondary | ICD-10-CM | POA: Diagnosis not present

## 2014-08-28 DIAGNOSIS — D509 Iron deficiency anemia, unspecified: Secondary | ICD-10-CM | POA: Diagnosis not present

## 2014-08-28 DIAGNOSIS — N2581 Secondary hyperparathyroidism of renal origin: Secondary | ICD-10-CM | POA: Diagnosis not present

## 2014-08-28 DIAGNOSIS — D631 Anemia in chronic kidney disease: Secondary | ICD-10-CM | POA: Diagnosis not present

## 2014-08-28 DIAGNOSIS — N186 End stage renal disease: Secondary | ICD-10-CM | POA: Diagnosis not present

## 2014-08-30 DIAGNOSIS — Z992 Dependence on renal dialysis: Secondary | ICD-10-CM | POA: Diagnosis not present

## 2014-08-30 DIAGNOSIS — D509 Iron deficiency anemia, unspecified: Secondary | ICD-10-CM | POA: Diagnosis not present

## 2014-08-30 DIAGNOSIS — N186 End stage renal disease: Secondary | ICD-10-CM | POA: Diagnosis not present

## 2014-08-30 DIAGNOSIS — D631 Anemia in chronic kidney disease: Secondary | ICD-10-CM | POA: Diagnosis not present

## 2014-08-30 DIAGNOSIS — N2581 Secondary hyperparathyroidism of renal origin: Secondary | ICD-10-CM | POA: Diagnosis not present

## 2014-09-02 DIAGNOSIS — N2581 Secondary hyperparathyroidism of renal origin: Secondary | ICD-10-CM | POA: Diagnosis not present

## 2014-09-02 DIAGNOSIS — D509 Iron deficiency anemia, unspecified: Secondary | ICD-10-CM | POA: Diagnosis not present

## 2014-09-02 DIAGNOSIS — D631 Anemia in chronic kidney disease: Secondary | ICD-10-CM | POA: Diagnosis not present

## 2014-09-02 DIAGNOSIS — N186 End stage renal disease: Secondary | ICD-10-CM | POA: Diagnosis not present

## 2014-09-02 DIAGNOSIS — Z992 Dependence on renal dialysis: Secondary | ICD-10-CM | POA: Diagnosis not present

## 2014-09-04 DIAGNOSIS — N2581 Secondary hyperparathyroidism of renal origin: Secondary | ICD-10-CM | POA: Diagnosis not present

## 2014-09-04 DIAGNOSIS — N186 End stage renal disease: Secondary | ICD-10-CM | POA: Diagnosis not present

## 2014-09-04 DIAGNOSIS — E109 Type 1 diabetes mellitus without complications: Secondary | ICD-10-CM | POA: Diagnosis not present

## 2014-09-04 DIAGNOSIS — D509 Iron deficiency anemia, unspecified: Secondary | ICD-10-CM | POA: Diagnosis not present

## 2014-09-04 DIAGNOSIS — Z794 Long term (current) use of insulin: Secondary | ICD-10-CM | POA: Diagnosis not present

## 2014-09-04 DIAGNOSIS — Z992 Dependence on renal dialysis: Secondary | ICD-10-CM | POA: Diagnosis not present

## 2014-09-04 DIAGNOSIS — D631 Anemia in chronic kidney disease: Secondary | ICD-10-CM | POA: Diagnosis not present

## 2014-09-06 DIAGNOSIS — D631 Anemia in chronic kidney disease: Secondary | ICD-10-CM | POA: Diagnosis not present

## 2014-09-06 DIAGNOSIS — D509 Iron deficiency anemia, unspecified: Secondary | ICD-10-CM | POA: Diagnosis not present

## 2014-09-06 DIAGNOSIS — N2581 Secondary hyperparathyroidism of renal origin: Secondary | ICD-10-CM | POA: Diagnosis not present

## 2014-09-06 DIAGNOSIS — N186 End stage renal disease: Secondary | ICD-10-CM | POA: Diagnosis not present

## 2014-09-06 DIAGNOSIS — Z992 Dependence on renal dialysis: Secondary | ICD-10-CM | POA: Diagnosis not present

## 2014-09-09 DIAGNOSIS — D509 Iron deficiency anemia, unspecified: Secondary | ICD-10-CM | POA: Diagnosis not present

## 2014-09-09 DIAGNOSIS — N2581 Secondary hyperparathyroidism of renal origin: Secondary | ICD-10-CM | POA: Diagnosis not present

## 2014-09-09 DIAGNOSIS — N186 End stage renal disease: Secondary | ICD-10-CM | POA: Diagnosis not present

## 2014-09-09 DIAGNOSIS — Z992 Dependence on renal dialysis: Secondary | ICD-10-CM | POA: Diagnosis not present

## 2014-09-09 DIAGNOSIS — D631 Anemia in chronic kidney disease: Secondary | ICD-10-CM | POA: Diagnosis not present

## 2014-09-11 DIAGNOSIS — N2581 Secondary hyperparathyroidism of renal origin: Secondary | ICD-10-CM | POA: Diagnosis not present

## 2014-09-11 DIAGNOSIS — D631 Anemia in chronic kidney disease: Secondary | ICD-10-CM | POA: Diagnosis not present

## 2014-09-11 DIAGNOSIS — Z992 Dependence on renal dialysis: Secondary | ICD-10-CM | POA: Diagnosis not present

## 2014-09-11 DIAGNOSIS — N186 End stage renal disease: Secondary | ICD-10-CM | POA: Diagnosis not present

## 2014-09-11 DIAGNOSIS — D509 Iron deficiency anemia, unspecified: Secondary | ICD-10-CM | POA: Diagnosis not present

## 2014-09-13 DIAGNOSIS — Z992 Dependence on renal dialysis: Secondary | ICD-10-CM | POA: Diagnosis not present

## 2014-09-13 DIAGNOSIS — D509 Iron deficiency anemia, unspecified: Secondary | ICD-10-CM | POA: Diagnosis not present

## 2014-09-13 DIAGNOSIS — D631 Anemia in chronic kidney disease: Secondary | ICD-10-CM | POA: Diagnosis not present

## 2014-09-13 DIAGNOSIS — N186 End stage renal disease: Secondary | ICD-10-CM | POA: Diagnosis not present

## 2014-09-13 DIAGNOSIS — N2581 Secondary hyperparathyroidism of renal origin: Secondary | ICD-10-CM | POA: Diagnosis not present

## 2014-09-16 DIAGNOSIS — Z992 Dependence on renal dialysis: Secondary | ICD-10-CM | POA: Diagnosis not present

## 2014-09-16 DIAGNOSIS — D509 Iron deficiency anemia, unspecified: Secondary | ICD-10-CM | POA: Diagnosis not present

## 2014-09-16 DIAGNOSIS — N2581 Secondary hyperparathyroidism of renal origin: Secondary | ICD-10-CM | POA: Diagnosis not present

## 2014-09-16 DIAGNOSIS — N186 End stage renal disease: Secondary | ICD-10-CM | POA: Diagnosis not present

## 2014-09-16 DIAGNOSIS — D631 Anemia in chronic kidney disease: Secondary | ICD-10-CM | POA: Diagnosis not present

## 2014-09-18 DIAGNOSIS — D509 Iron deficiency anemia, unspecified: Secondary | ICD-10-CM | POA: Diagnosis not present

## 2014-09-18 DIAGNOSIS — Z992 Dependence on renal dialysis: Secondary | ICD-10-CM | POA: Diagnosis not present

## 2014-09-18 DIAGNOSIS — N2581 Secondary hyperparathyroidism of renal origin: Secondary | ICD-10-CM | POA: Diagnosis not present

## 2014-09-18 DIAGNOSIS — N186 End stage renal disease: Secondary | ICD-10-CM | POA: Diagnosis not present

## 2014-09-18 DIAGNOSIS — D631 Anemia in chronic kidney disease: Secondary | ICD-10-CM | POA: Diagnosis not present

## 2014-09-20 DIAGNOSIS — N186 End stage renal disease: Secondary | ICD-10-CM | POA: Diagnosis not present

## 2014-09-20 DIAGNOSIS — D509 Iron deficiency anemia, unspecified: Secondary | ICD-10-CM | POA: Diagnosis not present

## 2014-09-20 DIAGNOSIS — Z992 Dependence on renal dialysis: Secondary | ICD-10-CM | POA: Diagnosis not present

## 2014-09-20 DIAGNOSIS — D631 Anemia in chronic kidney disease: Secondary | ICD-10-CM | POA: Diagnosis not present

## 2014-09-20 DIAGNOSIS — N2581 Secondary hyperparathyroidism of renal origin: Secondary | ICD-10-CM | POA: Diagnosis not present

## 2014-09-23 DIAGNOSIS — N186 End stage renal disease: Secondary | ICD-10-CM | POA: Diagnosis not present

## 2014-09-23 DIAGNOSIS — N2581 Secondary hyperparathyroidism of renal origin: Secondary | ICD-10-CM | POA: Diagnosis not present

## 2014-09-23 DIAGNOSIS — D631 Anemia in chronic kidney disease: Secondary | ICD-10-CM | POA: Diagnosis not present

## 2014-09-23 DIAGNOSIS — Z992 Dependence on renal dialysis: Secondary | ICD-10-CM | POA: Diagnosis not present

## 2014-09-23 DIAGNOSIS — D509 Iron deficiency anemia, unspecified: Secondary | ICD-10-CM | POA: Diagnosis not present

## 2014-09-25 DIAGNOSIS — Z992 Dependence on renal dialysis: Secondary | ICD-10-CM | POA: Diagnosis not present

## 2014-09-25 DIAGNOSIS — N2581 Secondary hyperparathyroidism of renal origin: Secondary | ICD-10-CM | POA: Diagnosis not present

## 2014-09-25 DIAGNOSIS — D631 Anemia in chronic kidney disease: Secondary | ICD-10-CM | POA: Diagnosis not present

## 2014-09-25 DIAGNOSIS — D509 Iron deficiency anemia, unspecified: Secondary | ICD-10-CM | POA: Diagnosis not present

## 2014-09-25 DIAGNOSIS — N186 End stage renal disease: Secondary | ICD-10-CM | POA: Diagnosis not present

## 2014-09-27 DIAGNOSIS — Z992 Dependence on renal dialysis: Secondary | ICD-10-CM | POA: Diagnosis not present

## 2014-09-27 DIAGNOSIS — D631 Anemia in chronic kidney disease: Secondary | ICD-10-CM | POA: Diagnosis not present

## 2014-09-27 DIAGNOSIS — D509 Iron deficiency anemia, unspecified: Secondary | ICD-10-CM | POA: Diagnosis not present

## 2014-09-27 DIAGNOSIS — N2581 Secondary hyperparathyroidism of renal origin: Secondary | ICD-10-CM | POA: Diagnosis not present

## 2014-09-27 DIAGNOSIS — N186 End stage renal disease: Secondary | ICD-10-CM | POA: Diagnosis not present

## 2014-09-30 DIAGNOSIS — D509 Iron deficiency anemia, unspecified: Secondary | ICD-10-CM | POA: Diagnosis not present

## 2014-09-30 DIAGNOSIS — N186 End stage renal disease: Secondary | ICD-10-CM | POA: Diagnosis not present

## 2014-09-30 DIAGNOSIS — D631 Anemia in chronic kidney disease: Secondary | ICD-10-CM | POA: Diagnosis not present

## 2014-09-30 DIAGNOSIS — N2581 Secondary hyperparathyroidism of renal origin: Secondary | ICD-10-CM | POA: Diagnosis not present

## 2014-09-30 DIAGNOSIS — Z992 Dependence on renal dialysis: Secondary | ICD-10-CM | POA: Diagnosis not present

## 2014-10-02 DIAGNOSIS — Z992 Dependence on renal dialysis: Secondary | ICD-10-CM | POA: Diagnosis not present

## 2014-10-02 DIAGNOSIS — N2581 Secondary hyperparathyroidism of renal origin: Secondary | ICD-10-CM | POA: Diagnosis not present

## 2014-10-02 DIAGNOSIS — D631 Anemia in chronic kidney disease: Secondary | ICD-10-CM | POA: Diagnosis not present

## 2014-10-02 DIAGNOSIS — N186 End stage renal disease: Secondary | ICD-10-CM | POA: Diagnosis not present

## 2014-10-02 DIAGNOSIS — D509 Iron deficiency anemia, unspecified: Secondary | ICD-10-CM | POA: Diagnosis not present

## 2014-10-04 DIAGNOSIS — N186 End stage renal disease: Secondary | ICD-10-CM | POA: Diagnosis not present

## 2014-10-04 DIAGNOSIS — D631 Anemia in chronic kidney disease: Secondary | ICD-10-CM | POA: Diagnosis not present

## 2014-10-04 DIAGNOSIS — N2581 Secondary hyperparathyroidism of renal origin: Secondary | ICD-10-CM | POA: Diagnosis not present

## 2014-10-04 DIAGNOSIS — D509 Iron deficiency anemia, unspecified: Secondary | ICD-10-CM | POA: Diagnosis not present

## 2014-10-04 DIAGNOSIS — Z992 Dependence on renal dialysis: Secondary | ICD-10-CM | POA: Diagnosis not present

## 2014-10-07 DIAGNOSIS — D631 Anemia in chronic kidney disease: Secondary | ICD-10-CM | POA: Diagnosis not present

## 2014-10-07 DIAGNOSIS — Z992 Dependence on renal dialysis: Secondary | ICD-10-CM | POA: Diagnosis not present

## 2014-10-07 DIAGNOSIS — N186 End stage renal disease: Secondary | ICD-10-CM | POA: Diagnosis not present

## 2014-10-07 DIAGNOSIS — N2581 Secondary hyperparathyroidism of renal origin: Secondary | ICD-10-CM | POA: Diagnosis not present

## 2014-10-07 DIAGNOSIS — D509 Iron deficiency anemia, unspecified: Secondary | ICD-10-CM | POA: Diagnosis not present

## 2014-10-08 DIAGNOSIS — M4722 Other spondylosis with radiculopathy, cervical region: Secondary | ICD-10-CM | POA: Diagnosis not present

## 2014-10-08 DIAGNOSIS — M25519 Pain in unspecified shoulder: Secondary | ICD-10-CM | POA: Diagnosis not present

## 2014-10-08 DIAGNOSIS — M7542 Impingement syndrome of left shoulder: Secondary | ICD-10-CM | POA: Diagnosis not present

## 2014-10-09 DIAGNOSIS — D509 Iron deficiency anemia, unspecified: Secondary | ICD-10-CM | POA: Diagnosis not present

## 2014-10-09 DIAGNOSIS — N186 End stage renal disease: Secondary | ICD-10-CM | POA: Diagnosis not present

## 2014-10-09 DIAGNOSIS — D631 Anemia in chronic kidney disease: Secondary | ICD-10-CM | POA: Diagnosis not present

## 2014-10-09 DIAGNOSIS — N2581 Secondary hyperparathyroidism of renal origin: Secondary | ICD-10-CM | POA: Diagnosis not present

## 2014-10-09 DIAGNOSIS — Z992 Dependence on renal dialysis: Secondary | ICD-10-CM | POA: Diagnosis not present

## 2014-10-11 DIAGNOSIS — N2581 Secondary hyperparathyroidism of renal origin: Secondary | ICD-10-CM | POA: Diagnosis not present

## 2014-10-11 DIAGNOSIS — N186 End stage renal disease: Secondary | ICD-10-CM | POA: Diagnosis not present

## 2014-10-11 DIAGNOSIS — D631 Anemia in chronic kidney disease: Secondary | ICD-10-CM | POA: Diagnosis not present

## 2014-10-11 DIAGNOSIS — D509 Iron deficiency anemia, unspecified: Secondary | ICD-10-CM | POA: Diagnosis not present

## 2014-10-11 DIAGNOSIS — Z992 Dependence on renal dialysis: Secondary | ICD-10-CM | POA: Diagnosis not present

## 2014-10-14 DIAGNOSIS — D631 Anemia in chronic kidney disease: Secondary | ICD-10-CM | POA: Diagnosis not present

## 2014-10-14 DIAGNOSIS — N186 End stage renal disease: Secondary | ICD-10-CM | POA: Diagnosis not present

## 2014-10-14 DIAGNOSIS — Z992 Dependence on renal dialysis: Secondary | ICD-10-CM | POA: Diagnosis not present

## 2014-10-14 DIAGNOSIS — D509 Iron deficiency anemia, unspecified: Secondary | ICD-10-CM | POA: Diagnosis not present

## 2014-10-14 DIAGNOSIS — N2581 Secondary hyperparathyroidism of renal origin: Secondary | ICD-10-CM | POA: Diagnosis not present

## 2014-10-16 DIAGNOSIS — D509 Iron deficiency anemia, unspecified: Secondary | ICD-10-CM | POA: Diagnosis not present

## 2014-10-16 DIAGNOSIS — D631 Anemia in chronic kidney disease: Secondary | ICD-10-CM | POA: Diagnosis not present

## 2014-10-16 DIAGNOSIS — N2581 Secondary hyperparathyroidism of renal origin: Secondary | ICD-10-CM | POA: Diagnosis not present

## 2014-10-16 DIAGNOSIS — Z992 Dependence on renal dialysis: Secondary | ICD-10-CM | POA: Diagnosis not present

## 2014-10-16 DIAGNOSIS — N186 End stage renal disease: Secondary | ICD-10-CM | POA: Diagnosis not present

## 2014-10-18 DIAGNOSIS — D631 Anemia in chronic kidney disease: Secondary | ICD-10-CM | POA: Diagnosis not present

## 2014-10-18 DIAGNOSIS — N2581 Secondary hyperparathyroidism of renal origin: Secondary | ICD-10-CM | POA: Diagnosis not present

## 2014-10-18 DIAGNOSIS — Z992 Dependence on renal dialysis: Secondary | ICD-10-CM | POA: Diagnosis not present

## 2014-10-18 DIAGNOSIS — N186 End stage renal disease: Secondary | ICD-10-CM | POA: Diagnosis not present

## 2014-10-18 DIAGNOSIS — D509 Iron deficiency anemia, unspecified: Secondary | ICD-10-CM | POA: Diagnosis not present

## 2014-10-18 HISTORY — PX: KIDNEY TRANSPLANT: SHX239

## 2014-10-21 DIAGNOSIS — N186 End stage renal disease: Secondary | ICD-10-CM | POA: Diagnosis not present

## 2014-10-21 DIAGNOSIS — E1022 Type 1 diabetes mellitus with diabetic chronic kidney disease: Secondary | ICD-10-CM | POA: Diagnosis not present

## 2014-10-21 DIAGNOSIS — N2581 Secondary hyperparathyroidism of renal origin: Secondary | ICD-10-CM | POA: Diagnosis not present

## 2014-10-21 DIAGNOSIS — D509 Iron deficiency anemia, unspecified: Secondary | ICD-10-CM | POA: Diagnosis not present

## 2014-10-21 DIAGNOSIS — Z992 Dependence on renal dialysis: Secondary | ICD-10-CM | POA: Diagnosis not present

## 2014-10-21 DIAGNOSIS — D631 Anemia in chronic kidney disease: Secondary | ICD-10-CM | POA: Diagnosis not present

## 2014-10-23 DIAGNOSIS — D509 Iron deficiency anemia, unspecified: Secondary | ICD-10-CM | POA: Diagnosis not present

## 2014-10-23 DIAGNOSIS — Z992 Dependence on renal dialysis: Secondary | ICD-10-CM | POA: Diagnosis not present

## 2014-10-23 DIAGNOSIS — D631 Anemia in chronic kidney disease: Secondary | ICD-10-CM | POA: Diagnosis not present

## 2014-10-23 DIAGNOSIS — N186 End stage renal disease: Secondary | ICD-10-CM | POA: Diagnosis not present

## 2014-10-23 DIAGNOSIS — N2581 Secondary hyperparathyroidism of renal origin: Secondary | ICD-10-CM | POA: Diagnosis not present

## 2014-10-25 DIAGNOSIS — D631 Anemia in chronic kidney disease: Secondary | ICD-10-CM | POA: Diagnosis not present

## 2014-10-25 DIAGNOSIS — D509 Iron deficiency anemia, unspecified: Secondary | ICD-10-CM | POA: Diagnosis not present

## 2014-10-25 DIAGNOSIS — Z992 Dependence on renal dialysis: Secondary | ICD-10-CM | POA: Diagnosis not present

## 2014-10-25 DIAGNOSIS — N2581 Secondary hyperparathyroidism of renal origin: Secondary | ICD-10-CM | POA: Diagnosis not present

## 2014-10-25 DIAGNOSIS — N186 End stage renal disease: Secondary | ICD-10-CM | POA: Diagnosis not present

## 2014-10-28 DIAGNOSIS — N186 End stage renal disease: Secondary | ICD-10-CM | POA: Diagnosis not present

## 2014-10-28 DIAGNOSIS — D509 Iron deficiency anemia, unspecified: Secondary | ICD-10-CM | POA: Diagnosis not present

## 2014-10-28 DIAGNOSIS — Z992 Dependence on renal dialysis: Secondary | ICD-10-CM | POA: Diagnosis not present

## 2014-10-28 DIAGNOSIS — D631 Anemia in chronic kidney disease: Secondary | ICD-10-CM | POA: Diagnosis not present

## 2014-10-28 DIAGNOSIS — N2581 Secondary hyperparathyroidism of renal origin: Secondary | ICD-10-CM | POA: Diagnosis not present

## 2014-10-30 DIAGNOSIS — N186 End stage renal disease: Secondary | ICD-10-CM | POA: Diagnosis not present

## 2014-10-30 DIAGNOSIS — N2581 Secondary hyperparathyroidism of renal origin: Secondary | ICD-10-CM | POA: Diagnosis not present

## 2014-10-30 DIAGNOSIS — D509 Iron deficiency anemia, unspecified: Secondary | ICD-10-CM | POA: Diagnosis not present

## 2014-10-30 DIAGNOSIS — D631 Anemia in chronic kidney disease: Secondary | ICD-10-CM | POA: Diagnosis not present

## 2014-10-30 DIAGNOSIS — Z992 Dependence on renal dialysis: Secondary | ICD-10-CM | POA: Diagnosis not present

## 2014-11-01 DIAGNOSIS — N186 End stage renal disease: Secondary | ICD-10-CM | POA: Diagnosis not present

## 2014-11-01 DIAGNOSIS — N2581 Secondary hyperparathyroidism of renal origin: Secondary | ICD-10-CM | POA: Diagnosis not present

## 2014-11-01 DIAGNOSIS — D631 Anemia in chronic kidney disease: Secondary | ICD-10-CM | POA: Diagnosis not present

## 2014-11-01 DIAGNOSIS — Z992 Dependence on renal dialysis: Secondary | ICD-10-CM | POA: Diagnosis not present

## 2014-11-04 DIAGNOSIS — H44521 Atrophy of globe, right eye: Secondary | ICD-10-CM | POA: Diagnosis not present

## 2014-11-04 DIAGNOSIS — D509 Iron deficiency anemia, unspecified: Secondary | ICD-10-CM | POA: Diagnosis not present

## 2014-11-04 DIAGNOSIS — E11359 Type 2 diabetes mellitus with proliferative diabetic retinopathy without macular edema: Secondary | ICD-10-CM | POA: Diagnosis not present

## 2014-11-04 DIAGNOSIS — H3342 Traction detachment of retina, left eye: Secondary | ICD-10-CM | POA: Diagnosis not present

## 2014-11-04 DIAGNOSIS — N2581 Secondary hyperparathyroidism of renal origin: Secondary | ICD-10-CM | POA: Diagnosis not present

## 2014-11-04 DIAGNOSIS — Z992 Dependence on renal dialysis: Secondary | ICD-10-CM | POA: Diagnosis not present

## 2014-11-04 DIAGNOSIS — N186 End stage renal disease: Secondary | ICD-10-CM | POA: Diagnosis not present

## 2014-11-04 DIAGNOSIS — D631 Anemia in chronic kidney disease: Secondary | ICD-10-CM | POA: Diagnosis not present

## 2014-11-05 DIAGNOSIS — E119 Type 2 diabetes mellitus without complications: Secondary | ICD-10-CM | POA: Diagnosis not present

## 2014-11-05 DIAGNOSIS — I871 Compression of vein: Secondary | ICD-10-CM | POA: Diagnosis not present

## 2014-11-05 DIAGNOSIS — I10 Essential (primary) hypertension: Secondary | ICD-10-CM | POA: Diagnosis not present

## 2014-11-05 DIAGNOSIS — I878 Other specified disorders of veins: Secondary | ICD-10-CM | POA: Diagnosis not present

## 2014-11-05 DIAGNOSIS — T82511A Breakdown (mechanical) of surgically created arteriovenous shunt, initial encounter: Secondary | ICD-10-CM | POA: Diagnosis not present

## 2014-11-05 DIAGNOSIS — T82590A Other mechanical complication of surgically created arteriovenous fistula, initial encounter: Secondary | ICD-10-CM | POA: Diagnosis not present

## 2014-11-06 DIAGNOSIS — Z01810 Encounter for preprocedural cardiovascular examination: Secondary | ICD-10-CM | POA: Diagnosis not present

## 2014-11-06 DIAGNOSIS — Z8669 Personal history of other diseases of the nervous system and sense organs: Secondary | ICD-10-CM | POA: Diagnosis not present

## 2014-11-06 DIAGNOSIS — E1165 Type 2 diabetes mellitus with hyperglycemia: Secondary | ICD-10-CM | POA: Diagnosis not present

## 2014-11-06 DIAGNOSIS — N2581 Secondary hyperparathyroidism of renal origin: Secondary | ICD-10-CM | POA: Diagnosis not present

## 2014-11-06 DIAGNOSIS — D631 Anemia in chronic kidney disease: Secondary | ICD-10-CM | POA: Diagnosis not present

## 2014-11-06 DIAGNOSIS — Z992 Dependence on renal dialysis: Secondary | ICD-10-CM | POA: Diagnosis not present

## 2014-11-06 DIAGNOSIS — E1122 Type 2 diabetes mellitus with diabetic chronic kidney disease: Secondary | ICD-10-CM | POA: Diagnosis not present

## 2014-11-06 DIAGNOSIS — I2510 Atherosclerotic heart disease of native coronary artery without angina pectoris: Secondary | ICD-10-CM | POA: Diagnosis not present

## 2014-11-06 DIAGNOSIS — D509 Iron deficiency anemia, unspecified: Secondary | ICD-10-CM | POA: Diagnosis not present

## 2014-11-06 DIAGNOSIS — I120 Hypertensive chronic kidney disease with stage 5 chronic kidney disease or end stage renal disease: Secondary | ICD-10-CM | POA: Diagnosis not present

## 2014-11-06 DIAGNOSIS — N186 End stage renal disease: Secondary | ICD-10-CM | POA: Diagnosis not present

## 2014-11-08 DIAGNOSIS — Z6830 Body mass index (BMI) 30.0-30.9, adult: Secondary | ICD-10-CM | POA: Diagnosis not present

## 2014-11-08 DIAGNOSIS — N186 End stage renal disease: Secondary | ICD-10-CM | POA: Diagnosis not present

## 2014-11-08 DIAGNOSIS — Z01818 Encounter for other preprocedural examination: Secondary | ICD-10-CM | POA: Diagnosis not present

## 2014-11-08 DIAGNOSIS — D509 Iron deficiency anemia, unspecified: Secondary | ICD-10-CM | POA: Diagnosis not present

## 2014-11-08 DIAGNOSIS — Z992 Dependence on renal dialysis: Secondary | ICD-10-CM | POA: Diagnosis not present

## 2014-11-08 DIAGNOSIS — N2581 Secondary hyperparathyroidism of renal origin: Secondary | ICD-10-CM | POA: Diagnosis not present

## 2014-11-08 DIAGNOSIS — D631 Anemia in chronic kidney disease: Secondary | ICD-10-CM | POA: Diagnosis not present

## 2014-11-08 DIAGNOSIS — Z01810 Encounter for preprocedural cardiovascular examination: Secondary | ICD-10-CM | POA: Diagnosis not present

## 2014-11-09 DIAGNOSIS — B9561 Methicillin susceptible Staphylococcus aureus infection as the cause of diseases classified elsewhere: Secondary | ICD-10-CM | POA: Diagnosis not present

## 2014-11-09 DIAGNOSIS — Z5181 Encounter for therapeutic drug level monitoring: Secondary | ICD-10-CM | POA: Diagnosis not present

## 2014-11-09 DIAGNOSIS — E1122 Type 2 diabetes mellitus with diabetic chronic kidney disease: Secondary | ICD-10-CM | POA: Diagnosis present

## 2014-11-09 DIAGNOSIS — N186 End stage renal disease: Secondary | ICD-10-CM | POA: Diagnosis not present

## 2014-11-09 DIAGNOSIS — I120 Hypertensive chronic kidney disease with stage 5 chronic kidney disease or end stage renal disease: Secondary | ICD-10-CM | POA: Diagnosis present

## 2014-11-09 DIAGNOSIS — R9431 Abnormal electrocardiogram [ECG] [EKG]: Secondary | ICD-10-CM | POA: Diagnosis not present

## 2014-11-09 DIAGNOSIS — E875 Hyperkalemia: Secondary | ICD-10-CM | POA: Diagnosis present

## 2014-11-09 DIAGNOSIS — Z940 Kidney transplant status: Secondary | ICD-10-CM | POA: Diagnosis not present

## 2014-11-09 DIAGNOSIS — Z452 Encounter for adjustment and management of vascular access device: Secondary | ICD-10-CM | POA: Diagnosis not present

## 2014-11-09 DIAGNOSIS — E1165 Type 2 diabetes mellitus with hyperglycemia: Secondary | ICD-10-CM | POA: Diagnosis present

## 2014-11-09 DIAGNOSIS — Z992 Dependence on renal dialysis: Secondary | ICD-10-CM | POA: Diagnosis not present

## 2014-11-09 DIAGNOSIS — Z8669 Personal history of other diseases of the nervous system and sense organs: Secondary | ICD-10-CM | POA: Diagnosis not present

## 2014-11-09 DIAGNOSIS — Z4822 Encounter for aftercare following kidney transplant: Secondary | ICD-10-CM | POA: Diagnosis not present

## 2014-11-09 DIAGNOSIS — T8613 Kidney transplant infection: Secondary | ICD-10-CM | POA: Diagnosis not present

## 2014-11-09 DIAGNOSIS — A4902 Methicillin resistant Staphylococcus aureus infection, unspecified site: Secondary | ICD-10-CM | POA: Diagnosis not present

## 2014-11-09 DIAGNOSIS — I871 Compression of vein: Secondary | ICD-10-CM | POA: Diagnosis not present

## 2014-11-09 DIAGNOSIS — Z4682 Encounter for fitting and adjustment of non-vascular catheter: Secondary | ICD-10-CM | POA: Diagnosis not present

## 2014-11-09 DIAGNOSIS — I2510 Atherosclerotic heart disease of native coronary artery without angina pectoris: Secondary | ICD-10-CM | POA: Diagnosis present

## 2014-11-10 DIAGNOSIS — Z940 Kidney transplant status: Secondary | ICD-10-CM | POA: Insufficient documentation

## 2014-11-13 DIAGNOSIS — E1165 Type 2 diabetes mellitus with hyperglycemia: Secondary | ICD-10-CM | POA: Insufficient documentation

## 2014-11-14 DIAGNOSIS — Z452 Encounter for adjustment and management of vascular access device: Secondary | ICD-10-CM | POA: Diagnosis not present

## 2014-11-14 DIAGNOSIS — I120 Hypertensive chronic kidney disease with stage 5 chronic kidney disease or end stage renal disease: Secondary | ICD-10-CM | POA: Diagnosis not present

## 2014-11-14 DIAGNOSIS — Z4822 Encounter for aftercare following kidney transplant: Secondary | ICD-10-CM | POA: Diagnosis not present

## 2014-11-14 DIAGNOSIS — E119 Type 2 diabetes mellitus without complications: Secondary | ICD-10-CM | POA: Diagnosis not present

## 2014-11-14 DIAGNOSIS — N186 End stage renal disease: Secondary | ICD-10-CM | POA: Diagnosis not present

## 2014-11-14 DIAGNOSIS — I2510 Atherosclerotic heart disease of native coronary artery without angina pectoris: Secondary | ICD-10-CM | POA: Diagnosis not present

## 2014-11-15 DIAGNOSIS — E559 Vitamin D deficiency, unspecified: Secondary | ICD-10-CM | POA: Diagnosis not present

## 2014-11-15 DIAGNOSIS — D631 Anemia in chronic kidney disease: Secondary | ICD-10-CM | POA: Diagnosis not present

## 2014-11-15 DIAGNOSIS — Z79899 Other long term (current) drug therapy: Secondary | ICD-10-CM | POA: Diagnosis not present

## 2014-11-15 DIAGNOSIS — E1129 Type 2 diabetes mellitus with other diabetic kidney complication: Secondary | ICD-10-CM | POA: Diagnosis not present

## 2014-11-15 DIAGNOSIS — Z114 Encounter for screening for human immunodeficiency virus [HIV]: Secondary | ICD-10-CM | POA: Diagnosis not present

## 2014-11-15 DIAGNOSIS — N390 Urinary tract infection, site not specified: Secondary | ICD-10-CM | POA: Diagnosis not present

## 2014-11-15 DIAGNOSIS — Z940 Kidney transplant status: Secondary | ICD-10-CM | POA: Diagnosis not present

## 2014-11-15 DIAGNOSIS — Z789 Other specified health status: Secondary | ICD-10-CM | POA: Diagnosis not present

## 2014-11-15 DIAGNOSIS — Z09 Encounter for follow-up examination after completed treatment for conditions other than malignant neoplasm: Secondary | ICD-10-CM | POA: Diagnosis not present

## 2014-11-15 DIAGNOSIS — T8610 Unspecified complication of kidney transplant: Secondary | ICD-10-CM | POA: Diagnosis not present

## 2014-11-18 DIAGNOSIS — Z0001 Encounter for general adult medical examination with abnormal findings: Secondary | ICD-10-CM | POA: Diagnosis not present

## 2014-11-18 DIAGNOSIS — E119 Type 2 diabetes mellitus without complications: Secondary | ICD-10-CM | POA: Diagnosis not present

## 2014-11-18 DIAGNOSIS — Z4822 Encounter for aftercare following kidney transplant: Secondary | ICD-10-CM | POA: Diagnosis not present

## 2014-11-18 DIAGNOSIS — I10 Essential (primary) hypertension: Secondary | ICD-10-CM | POA: Diagnosis not present

## 2014-11-18 DIAGNOSIS — E1129 Type 2 diabetes mellitus with other diabetic kidney complication: Secondary | ICD-10-CM | POA: Diagnosis not present

## 2014-11-18 DIAGNOSIS — Z940 Kidney transplant status: Secondary | ICD-10-CM | POA: Diagnosis not present

## 2014-11-18 DIAGNOSIS — Z7982 Long term (current) use of aspirin: Secondary | ICD-10-CM | POA: Diagnosis not present

## 2014-11-18 DIAGNOSIS — Z79899 Other long term (current) drug therapy: Secondary | ICD-10-CM | POA: Diagnosis not present

## 2014-11-18 DIAGNOSIS — N390 Urinary tract infection, site not specified: Secondary | ICD-10-CM | POA: Diagnosis not present

## 2014-11-18 DIAGNOSIS — Z794 Long term (current) use of insulin: Secondary | ICD-10-CM | POA: Diagnosis not present

## 2014-11-18 DIAGNOSIS — I2510 Atherosclerotic heart disease of native coronary artery without angina pectoris: Secondary | ICD-10-CM | POA: Diagnosis not present

## 2014-11-18 DIAGNOSIS — T8613 Kidney transplant infection: Secondary | ICD-10-CM | POA: Diagnosis not present

## 2014-11-19 DIAGNOSIS — I878 Other specified disorders of veins: Secondary | ICD-10-CM | POA: Diagnosis not present

## 2014-11-19 DIAGNOSIS — T82511A Breakdown (mechanical) of surgically created arteriovenous shunt, initial encounter: Secondary | ICD-10-CM | POA: Diagnosis not present

## 2014-11-20 DIAGNOSIS — I120 Hypertensive chronic kidney disease with stage 5 chronic kidney disease or end stage renal disease: Secondary | ICD-10-CM | POA: Diagnosis not present

## 2014-11-20 DIAGNOSIS — Z452 Encounter for adjustment and management of vascular access device: Secondary | ICD-10-CM | POA: Diagnosis not present

## 2014-11-20 DIAGNOSIS — T8610 Unspecified complication of kidney transplant: Secondary | ICD-10-CM | POA: Diagnosis not present

## 2014-11-20 DIAGNOSIS — Z09 Encounter for follow-up examination after completed treatment for conditions other than malignant neoplasm: Secondary | ICD-10-CM | POA: Diagnosis not present

## 2014-11-20 DIAGNOSIS — E1129 Type 2 diabetes mellitus with other diabetic kidney complication: Secondary | ICD-10-CM | POA: Diagnosis not present

## 2014-11-20 DIAGNOSIS — N186 End stage renal disease: Secondary | ICD-10-CM | POA: Diagnosis not present

## 2014-11-20 DIAGNOSIS — Z79899 Other long term (current) drug therapy: Secondary | ICD-10-CM | POA: Diagnosis not present

## 2014-11-20 DIAGNOSIS — E559 Vitamin D deficiency, unspecified: Secondary | ICD-10-CM | POA: Diagnosis not present

## 2014-11-20 DIAGNOSIS — E119 Type 2 diabetes mellitus without complications: Secondary | ICD-10-CM | POA: Diagnosis not present

## 2014-11-20 DIAGNOSIS — N390 Urinary tract infection, site not specified: Secondary | ICD-10-CM | POA: Diagnosis not present

## 2014-11-20 DIAGNOSIS — Z4822 Encounter for aftercare following kidney transplant: Secondary | ICD-10-CM | POA: Diagnosis not present

## 2014-11-20 DIAGNOSIS — Z940 Kidney transplant status: Secondary | ICD-10-CM | POA: Diagnosis not present

## 2014-11-20 DIAGNOSIS — D631 Anemia in chronic kidney disease: Secondary | ICD-10-CM | POA: Diagnosis not present

## 2014-11-20 DIAGNOSIS — Z114 Encounter for screening for human immunodeficiency virus [HIV]: Secondary | ICD-10-CM | POA: Diagnosis not present

## 2014-11-20 DIAGNOSIS — I2510 Atherosclerotic heart disease of native coronary artery without angina pectoris: Secondary | ICD-10-CM | POA: Diagnosis not present

## 2014-11-20 DIAGNOSIS — Z789 Other specified health status: Secondary | ICD-10-CM | POA: Diagnosis not present

## 2014-11-22 DIAGNOSIS — Z940 Kidney transplant status: Secondary | ICD-10-CM | POA: Diagnosis not present

## 2014-11-22 DIAGNOSIS — E1129 Type 2 diabetes mellitus with other diabetic kidney complication: Secondary | ICD-10-CM | POA: Diagnosis not present

## 2014-11-22 DIAGNOSIS — T8610 Unspecified complication of kidney transplant: Secondary | ICD-10-CM | POA: Diagnosis not present

## 2014-11-22 DIAGNOSIS — Z4822 Encounter for aftercare following kidney transplant: Secondary | ICD-10-CM | POA: Diagnosis not present

## 2014-11-22 DIAGNOSIS — Z09 Encounter for follow-up examination after completed treatment for conditions other than malignant neoplasm: Secondary | ICD-10-CM | POA: Diagnosis not present

## 2014-11-22 DIAGNOSIS — Z79899 Other long term (current) drug therapy: Secondary | ICD-10-CM | POA: Diagnosis not present

## 2014-11-22 DIAGNOSIS — E119 Type 2 diabetes mellitus without complications: Secondary | ICD-10-CM | POA: Diagnosis not present

## 2014-11-22 DIAGNOSIS — Z789 Other specified health status: Secondary | ICD-10-CM | POA: Diagnosis not present

## 2014-11-22 DIAGNOSIS — I120 Hypertensive chronic kidney disease with stage 5 chronic kidney disease or end stage renal disease: Secondary | ICD-10-CM | POA: Diagnosis not present

## 2014-11-22 DIAGNOSIS — E559 Vitamin D deficiency, unspecified: Secondary | ICD-10-CM | POA: Diagnosis not present

## 2014-11-22 DIAGNOSIS — Z452 Encounter for adjustment and management of vascular access device: Secondary | ICD-10-CM | POA: Diagnosis not present

## 2014-11-22 DIAGNOSIS — N390 Urinary tract infection, site not specified: Secondary | ICD-10-CM | POA: Diagnosis not present

## 2014-11-22 DIAGNOSIS — D631 Anemia in chronic kidney disease: Secondary | ICD-10-CM | POA: Diagnosis not present

## 2014-11-22 DIAGNOSIS — I2510 Atherosclerotic heart disease of native coronary artery without angina pectoris: Secondary | ICD-10-CM | POA: Diagnosis not present

## 2014-11-22 DIAGNOSIS — N186 End stage renal disease: Secondary | ICD-10-CM | POA: Diagnosis not present

## 2014-11-25 DIAGNOSIS — Z09 Encounter for follow-up examination after completed treatment for conditions other than malignant neoplasm: Secondary | ICD-10-CM | POA: Diagnosis not present

## 2014-11-25 DIAGNOSIS — Z789 Other specified health status: Secondary | ICD-10-CM | POA: Diagnosis not present

## 2014-11-25 DIAGNOSIS — Z559 Problems related to education and literacy, unspecified: Secondary | ICD-10-CM | POA: Diagnosis not present

## 2014-11-25 DIAGNOSIS — Z114 Encounter for screening for human immunodeficiency virus [HIV]: Secondary | ICD-10-CM | POA: Diagnosis not present

## 2014-11-25 DIAGNOSIS — D631 Anemia in chronic kidney disease: Secondary | ICD-10-CM | POA: Diagnosis not present

## 2014-11-25 DIAGNOSIS — T8610 Unspecified complication of kidney transplant: Secondary | ICD-10-CM | POA: Diagnosis not present

## 2014-11-25 DIAGNOSIS — Z940 Kidney transplant status: Secondary | ICD-10-CM | POA: Diagnosis not present

## 2014-11-25 DIAGNOSIS — Z79899 Other long term (current) drug therapy: Secondary | ICD-10-CM | POA: Diagnosis not present

## 2014-11-25 DIAGNOSIS — E1129 Type 2 diabetes mellitus with other diabetic kidney complication: Secondary | ICD-10-CM | POA: Diagnosis not present

## 2014-11-25 DIAGNOSIS — N390 Urinary tract infection, site not specified: Secondary | ICD-10-CM | POA: Diagnosis not present

## 2014-11-26 DIAGNOSIS — Z452 Encounter for adjustment and management of vascular access device: Secondary | ICD-10-CM | POA: Diagnosis not present

## 2014-11-26 DIAGNOSIS — I2510 Atherosclerotic heart disease of native coronary artery without angina pectoris: Secondary | ICD-10-CM | POA: Diagnosis not present

## 2014-11-26 DIAGNOSIS — N186 End stage renal disease: Secondary | ICD-10-CM | POA: Diagnosis not present

## 2014-11-26 DIAGNOSIS — Z5181 Encounter for therapeutic drug level monitoring: Secondary | ICD-10-CM | POA: Diagnosis not present

## 2014-11-26 DIAGNOSIS — Z4822 Encounter for aftercare following kidney transplant: Secondary | ICD-10-CM | POA: Diagnosis not present

## 2014-11-26 DIAGNOSIS — I120 Hypertensive chronic kidney disease with stage 5 chronic kidney disease or end stage renal disease: Secondary | ICD-10-CM | POA: Diagnosis not present

## 2014-11-26 DIAGNOSIS — E119 Type 2 diabetes mellitus without complications: Secondary | ICD-10-CM | POA: Diagnosis not present

## 2014-11-28 DIAGNOSIS — Z79899 Other long term (current) drug therapy: Secondary | ICD-10-CM | POA: Diagnosis not present

## 2014-11-28 DIAGNOSIS — Z940 Kidney transplant status: Secondary | ICD-10-CM | POA: Diagnosis not present

## 2014-11-28 DIAGNOSIS — N189 Chronic kidney disease, unspecified: Secondary | ICD-10-CM | POA: Diagnosis not present

## 2014-11-28 DIAGNOSIS — Z4822 Encounter for aftercare following kidney transplant: Secondary | ICD-10-CM | POA: Diagnosis not present

## 2014-11-28 DIAGNOSIS — D899 Disorder involving the immune mechanism, unspecified: Secondary | ICD-10-CM | POA: Diagnosis not present

## 2014-11-28 DIAGNOSIS — Z9189 Other specified personal risk factors, not elsewhere classified: Secondary | ICD-10-CM | POA: Diagnosis not present

## 2014-11-28 DIAGNOSIS — E1021 Type 1 diabetes mellitus with diabetic nephropathy: Secondary | ICD-10-CM | POA: Diagnosis not present

## 2014-11-28 DIAGNOSIS — Z452 Encounter for adjustment and management of vascular access device: Secondary | ICD-10-CM | POA: Diagnosis not present

## 2014-11-29 DIAGNOSIS — Z79899 Other long term (current) drug therapy: Secondary | ICD-10-CM | POA: Diagnosis not present

## 2014-11-29 DIAGNOSIS — Z09 Encounter for follow-up examination after completed treatment for conditions other than malignant neoplasm: Secondary | ICD-10-CM | POA: Diagnosis not present

## 2014-11-29 DIAGNOSIS — N390 Urinary tract infection, site not specified: Secondary | ICD-10-CM | POA: Diagnosis not present

## 2014-11-29 DIAGNOSIS — Z114 Encounter for screening for human immunodeficiency virus [HIV]: Secondary | ICD-10-CM | POA: Diagnosis not present

## 2014-11-29 DIAGNOSIS — T8610 Unspecified complication of kidney transplant: Secondary | ICD-10-CM | POA: Diagnosis not present

## 2014-11-29 DIAGNOSIS — E1129 Type 2 diabetes mellitus with other diabetic kidney complication: Secondary | ICD-10-CM | POA: Diagnosis not present

## 2014-11-29 DIAGNOSIS — Z940 Kidney transplant status: Secondary | ICD-10-CM | POA: Diagnosis not present

## 2014-11-29 DIAGNOSIS — D631 Anemia in chronic kidney disease: Secondary | ICD-10-CM | POA: Diagnosis not present

## 2014-11-29 DIAGNOSIS — E559 Vitamin D deficiency, unspecified: Secondary | ICD-10-CM | POA: Diagnosis not present

## 2014-11-29 DIAGNOSIS — Z789 Other specified health status: Secondary | ICD-10-CM | POA: Diagnosis not present

## 2014-12-02 DIAGNOSIS — Z79899 Other long term (current) drug therapy: Secondary | ICD-10-CM | POA: Diagnosis not present

## 2014-12-02 DIAGNOSIS — Z940 Kidney transplant status: Secondary | ICD-10-CM | POA: Diagnosis not present

## 2014-12-02 DIAGNOSIS — E559 Vitamin D deficiency, unspecified: Secondary | ICD-10-CM | POA: Diagnosis not present

## 2014-12-02 DIAGNOSIS — N390 Urinary tract infection, site not specified: Secondary | ICD-10-CM | POA: Diagnosis not present

## 2014-12-02 DIAGNOSIS — D631 Anemia in chronic kidney disease: Secondary | ICD-10-CM | POA: Diagnosis not present

## 2014-12-02 DIAGNOSIS — Z789 Other specified health status: Secondary | ICD-10-CM | POA: Diagnosis not present

## 2014-12-02 DIAGNOSIS — Z09 Encounter for follow-up examination after completed treatment for conditions other than malignant neoplasm: Secondary | ICD-10-CM | POA: Diagnosis not present

## 2014-12-02 DIAGNOSIS — Z114 Encounter for screening for human immunodeficiency virus [HIV]: Secondary | ICD-10-CM | POA: Diagnosis not present

## 2014-12-02 DIAGNOSIS — E1129 Type 2 diabetes mellitus with other diabetic kidney complication: Secondary | ICD-10-CM | POA: Diagnosis not present

## 2014-12-05 DIAGNOSIS — I120 Hypertensive chronic kidney disease with stage 5 chronic kidney disease or end stage renal disease: Secondary | ICD-10-CM | POA: Diagnosis not present

## 2014-12-05 DIAGNOSIS — Z4822 Encounter for aftercare following kidney transplant: Secondary | ICD-10-CM | POA: Diagnosis not present

## 2014-12-05 DIAGNOSIS — I2510 Atherosclerotic heart disease of native coronary artery without angina pectoris: Secondary | ICD-10-CM | POA: Diagnosis not present

## 2014-12-05 DIAGNOSIS — E119 Type 2 diabetes mellitus without complications: Secondary | ICD-10-CM | POA: Diagnosis not present

## 2014-12-05 DIAGNOSIS — Z452 Encounter for adjustment and management of vascular access device: Secondary | ICD-10-CM | POA: Diagnosis not present

## 2014-12-05 DIAGNOSIS — N186 End stage renal disease: Secondary | ICD-10-CM | POA: Diagnosis not present

## 2014-12-09 DIAGNOSIS — T8610 Unspecified complication of kidney transplant: Secondary | ICD-10-CM | POA: Diagnosis not present

## 2014-12-09 DIAGNOSIS — Z114 Encounter for screening for human immunodeficiency virus [HIV]: Secondary | ICD-10-CM | POA: Diagnosis not present

## 2014-12-09 DIAGNOSIS — D631 Anemia in chronic kidney disease: Secondary | ICD-10-CM | POA: Diagnosis not present

## 2014-12-09 DIAGNOSIS — Z789 Other specified health status: Secondary | ICD-10-CM | POA: Diagnosis not present

## 2014-12-09 DIAGNOSIS — E559 Vitamin D deficiency, unspecified: Secondary | ICD-10-CM | POA: Diagnosis not present

## 2014-12-09 DIAGNOSIS — E1129 Type 2 diabetes mellitus with other diabetic kidney complication: Secondary | ICD-10-CM | POA: Diagnosis not present

## 2014-12-09 DIAGNOSIS — Z09 Encounter for follow-up examination after completed treatment for conditions other than malignant neoplasm: Secondary | ICD-10-CM | POA: Diagnosis not present

## 2014-12-09 DIAGNOSIS — Z940 Kidney transplant status: Secondary | ICD-10-CM | POA: Diagnosis not present

## 2014-12-09 DIAGNOSIS — N189 Chronic kidney disease, unspecified: Secondary | ICD-10-CM | POA: Diagnosis not present

## 2014-12-09 DIAGNOSIS — N390 Urinary tract infection, site not specified: Secondary | ICD-10-CM | POA: Diagnosis not present

## 2014-12-09 DIAGNOSIS — Z79899 Other long term (current) drug therapy: Secondary | ICD-10-CM | POA: Diagnosis not present

## 2014-12-11 DIAGNOSIS — R197 Diarrhea, unspecified: Secondary | ICD-10-CM | POA: Diagnosis not present

## 2014-12-11 DIAGNOSIS — D899 Disorder involving the immune mechanism, unspecified: Secondary | ICD-10-CM | POA: Diagnosis not present

## 2014-12-11 DIAGNOSIS — I2510 Atherosclerotic heart disease of native coronary artery without angina pectoris: Secondary | ICD-10-CM | POA: Diagnosis not present

## 2014-12-11 DIAGNOSIS — E119 Type 2 diabetes mellitus without complications: Secondary | ICD-10-CM | POA: Diagnosis not present

## 2014-12-11 DIAGNOSIS — R0901 Asphyxia: Secondary | ICD-10-CM | POA: Diagnosis not present

## 2014-12-11 DIAGNOSIS — Z79899 Other long term (current) drug therapy: Secondary | ICD-10-CM | POA: Diagnosis not present

## 2014-12-11 DIAGNOSIS — I119 Hypertensive heart disease without heart failure: Secondary | ICD-10-CM | POA: Diagnosis not present

## 2014-12-11 DIAGNOSIS — I10 Essential (primary) hypertension: Secondary | ICD-10-CM | POA: Diagnosis not present

## 2014-12-11 DIAGNOSIS — Z4822 Encounter for aftercare following kidney transplant: Secondary | ICD-10-CM | POA: Diagnosis not present

## 2014-12-11 DIAGNOSIS — H5441 Blindness, right eye, normal vision left eye: Secondary | ICD-10-CM | POA: Diagnosis not present

## 2014-12-11 DIAGNOSIS — Z48298 Encounter for aftercare following other organ transplant: Secondary | ICD-10-CM | POA: Diagnosis not present

## 2014-12-11 DIAGNOSIS — Z940 Kidney transplant status: Secondary | ICD-10-CM | POA: Diagnosis not present

## 2014-12-11 DIAGNOSIS — Z6828 Body mass index (BMI) 28.0-28.9, adult: Secondary | ICD-10-CM | POA: Diagnosis not present

## 2014-12-11 DIAGNOSIS — Z466 Encounter for fitting and adjustment of urinary device: Secondary | ICD-10-CM | POA: Diagnosis not present

## 2014-12-11 DIAGNOSIS — E875 Hyperkalemia: Secondary | ICD-10-CM | POA: Diagnosis not present

## 2014-12-11 DIAGNOSIS — E1165 Type 2 diabetes mellitus with hyperglycemia: Secondary | ICD-10-CM | POA: Diagnosis not present

## 2014-12-12 DIAGNOSIS — D849 Immunodeficiency, unspecified: Secondary | ICD-10-CM | POA: Insufficient documentation

## 2014-12-12 DIAGNOSIS — Z48298 Encounter for aftercare following other organ transplant: Secondary | ICD-10-CM | POA: Insufficient documentation

## 2014-12-12 DIAGNOSIS — I119 Hypertensive heart disease without heart failure: Secondary | ICD-10-CM | POA: Insufficient documentation

## 2014-12-12 DIAGNOSIS — H5440 Blindness, one eye, unspecified eye: Secondary | ICD-10-CM | POA: Insufficient documentation

## 2014-12-12 DIAGNOSIS — D899 Disorder involving the immune mechanism, unspecified: Secondary | ICD-10-CM

## 2014-12-16 DIAGNOSIS — Z114 Encounter for screening for human immunodeficiency virus [HIV]: Secondary | ICD-10-CM | POA: Diagnosis not present

## 2014-12-16 DIAGNOSIS — E559 Vitamin D deficiency, unspecified: Secondary | ICD-10-CM | POA: Diagnosis not present

## 2014-12-16 DIAGNOSIS — Z940 Kidney transplant status: Secondary | ICD-10-CM | POA: Diagnosis not present

## 2014-12-16 DIAGNOSIS — N390 Urinary tract infection, site not specified: Secondary | ICD-10-CM | POA: Diagnosis not present

## 2014-12-16 DIAGNOSIS — E1129 Type 2 diabetes mellitus with other diabetic kidney complication: Secondary | ICD-10-CM | POA: Diagnosis not present

## 2014-12-16 DIAGNOSIS — D631 Anemia in chronic kidney disease: Secondary | ICD-10-CM | POA: Diagnosis not present

## 2014-12-16 DIAGNOSIS — Z79899 Other long term (current) drug therapy: Secondary | ICD-10-CM | POA: Diagnosis not present

## 2014-12-16 DIAGNOSIS — T8610 Unspecified complication of kidney transplant: Secondary | ICD-10-CM | POA: Diagnosis not present

## 2014-12-16 DIAGNOSIS — Z789 Other specified health status: Secondary | ICD-10-CM | POA: Diagnosis not present

## 2015-01-06 DIAGNOSIS — E11359 Type 2 diabetes mellitus with proliferative diabetic retinopathy without macular edema: Secondary | ICD-10-CM | POA: Diagnosis not present

## 2015-01-08 DIAGNOSIS — Z713 Dietary counseling and surveillance: Secondary | ICD-10-CM | POA: Diagnosis not present

## 2015-01-08 DIAGNOSIS — Z4822 Encounter for aftercare following kidney transplant: Secondary | ICD-10-CM | POA: Diagnosis not present

## 2015-01-08 DIAGNOSIS — E119 Type 2 diabetes mellitus without complications: Secondary | ICD-10-CM | POA: Diagnosis not present

## 2015-01-08 DIAGNOSIS — E875 Hyperkalemia: Secondary | ICD-10-CM | POA: Diagnosis not present

## 2015-01-08 DIAGNOSIS — I10 Essential (primary) hypertension: Secondary | ICD-10-CM | POA: Diagnosis not present

## 2015-01-08 DIAGNOSIS — Z48298 Encounter for aftercare following other organ transplant: Secondary | ICD-10-CM | POA: Diagnosis not present

## 2015-01-08 DIAGNOSIS — I2510 Atherosclerotic heart disease of native coronary artery without angina pectoris: Secondary | ICD-10-CM | POA: Diagnosis not present

## 2015-01-08 DIAGNOSIS — Z940 Kidney transplant status: Secondary | ICD-10-CM | POA: Diagnosis not present

## 2015-01-08 DIAGNOSIS — Z79899 Other long term (current) drug therapy: Secondary | ICD-10-CM | POA: Diagnosis not present

## 2015-01-13 DIAGNOSIS — T8610 Unspecified complication of kidney transplant: Secondary | ICD-10-CM | POA: Diagnosis not present

## 2015-01-13 DIAGNOSIS — Z940 Kidney transplant status: Secondary | ICD-10-CM | POA: Diagnosis not present

## 2015-01-13 DIAGNOSIS — Z114 Encounter for screening for human immunodeficiency virus [HIV]: Secondary | ICD-10-CM | POA: Diagnosis not present

## 2015-01-13 DIAGNOSIS — D631 Anemia in chronic kidney disease: Secondary | ICD-10-CM | POA: Diagnosis not present

## 2015-01-13 DIAGNOSIS — E1129 Type 2 diabetes mellitus with other diabetic kidney complication: Secondary | ICD-10-CM | POA: Diagnosis not present

## 2015-01-13 DIAGNOSIS — E559 Vitamin D deficiency, unspecified: Secondary | ICD-10-CM | POA: Diagnosis not present

## 2015-01-13 DIAGNOSIS — Z79899 Other long term (current) drug therapy: Secondary | ICD-10-CM | POA: Diagnosis not present

## 2015-01-13 DIAGNOSIS — N390 Urinary tract infection, site not specified: Secondary | ICD-10-CM | POA: Diagnosis not present

## 2015-01-13 DIAGNOSIS — Z789 Other specified health status: Secondary | ICD-10-CM | POA: Diagnosis not present

## 2015-01-16 DIAGNOSIS — T8610 Unspecified complication of kidney transplant: Secondary | ICD-10-CM | POA: Diagnosis not present

## 2015-01-16 DIAGNOSIS — Z79899 Other long term (current) drug therapy: Secondary | ICD-10-CM | POA: Diagnosis not present

## 2015-01-16 DIAGNOSIS — E559 Vitamin D deficiency, unspecified: Secondary | ICD-10-CM | POA: Diagnosis not present

## 2015-01-16 DIAGNOSIS — N390 Urinary tract infection, site not specified: Secondary | ICD-10-CM | POA: Diagnosis not present

## 2015-01-16 DIAGNOSIS — D631 Anemia in chronic kidney disease: Secondary | ICD-10-CM | POA: Diagnosis not present

## 2015-01-16 DIAGNOSIS — Z940 Kidney transplant status: Secondary | ICD-10-CM | POA: Diagnosis not present

## 2015-01-16 DIAGNOSIS — E1129 Type 2 diabetes mellitus with other diabetic kidney complication: Secondary | ICD-10-CM | POA: Diagnosis not present

## 2015-01-16 DIAGNOSIS — Z114 Encounter for screening for human immunodeficiency virus [HIV]: Secondary | ICD-10-CM | POA: Diagnosis not present

## 2015-01-20 DIAGNOSIS — Z79899 Other long term (current) drug therapy: Secondary | ICD-10-CM | POA: Diagnosis not present

## 2015-01-20 DIAGNOSIS — N390 Urinary tract infection, site not specified: Secondary | ICD-10-CM | POA: Diagnosis not present

## 2015-01-20 DIAGNOSIS — Z940 Kidney transplant status: Secondary | ICD-10-CM | POA: Diagnosis not present

## 2015-01-20 DIAGNOSIS — Z789 Other specified health status: Secondary | ICD-10-CM | POA: Diagnosis not present

## 2015-01-20 DIAGNOSIS — E1129 Type 2 diabetes mellitus with other diabetic kidney complication: Secondary | ICD-10-CM | POA: Diagnosis not present

## 2015-01-20 DIAGNOSIS — T8610 Unspecified complication of kidney transplant: Secondary | ICD-10-CM | POA: Diagnosis not present

## 2015-01-20 DIAGNOSIS — Z114 Encounter for screening for human immunodeficiency virus [HIV]: Secondary | ICD-10-CM | POA: Diagnosis not present

## 2015-01-20 DIAGNOSIS — E559 Vitamin D deficiency, unspecified: Secondary | ICD-10-CM | POA: Diagnosis not present

## 2015-01-20 DIAGNOSIS — N189 Chronic kidney disease, unspecified: Secondary | ICD-10-CM | POA: Diagnosis not present

## 2015-01-20 DIAGNOSIS — Z09 Encounter for follow-up examination after completed treatment for conditions other than malignant neoplasm: Secondary | ICD-10-CM | POA: Diagnosis not present

## 2015-01-20 DIAGNOSIS — D631 Anemia in chronic kidney disease: Secondary | ICD-10-CM | POA: Diagnosis not present

## 2015-01-30 DIAGNOSIS — Z940 Kidney transplant status: Secondary | ICD-10-CM | POA: Diagnosis not present

## 2015-01-30 DIAGNOSIS — D631 Anemia in chronic kidney disease: Secondary | ICD-10-CM | POA: Diagnosis not present

## 2015-01-30 DIAGNOSIS — E1129 Type 2 diabetes mellitus with other diabetic kidney complication: Secondary | ICD-10-CM | POA: Diagnosis not present

## 2015-01-30 DIAGNOSIS — Z09 Encounter for follow-up examination after completed treatment for conditions other than malignant neoplasm: Secondary | ICD-10-CM | POA: Diagnosis not present

## 2015-01-30 DIAGNOSIS — E559 Vitamin D deficiency, unspecified: Secondary | ICD-10-CM | POA: Diagnosis not present

## 2015-01-30 DIAGNOSIS — Z789 Other specified health status: Secondary | ICD-10-CM | POA: Diagnosis not present

## 2015-01-30 DIAGNOSIS — Z79899 Other long term (current) drug therapy: Secondary | ICD-10-CM | POA: Diagnosis not present

## 2015-01-30 DIAGNOSIS — T8610 Unspecified complication of kidney transplant: Secondary | ICD-10-CM | POA: Diagnosis not present

## 2015-01-30 DIAGNOSIS — N390 Urinary tract infection, site not specified: Secondary | ICD-10-CM | POA: Diagnosis not present

## 2015-01-30 DIAGNOSIS — Z114 Encounter for screening for human immunodeficiency virus [HIV]: Secondary | ICD-10-CM | POA: Diagnosis not present

## 2015-02-05 DIAGNOSIS — H401121 Primary open-angle glaucoma, left eye, mild stage: Secondary | ICD-10-CM | POA: Diagnosis not present

## 2015-02-12 DIAGNOSIS — Z7984 Long term (current) use of oral hypoglycemic drugs: Secondary | ICD-10-CM | POA: Diagnosis not present

## 2015-02-12 DIAGNOSIS — Z6829 Body mass index (BMI) 29.0-29.9, adult: Secondary | ICD-10-CM | POA: Diagnosis not present

## 2015-02-12 DIAGNOSIS — I10 Essential (primary) hypertension: Secondary | ICD-10-CM | POA: Diagnosis not present

## 2015-02-12 DIAGNOSIS — Z4822 Encounter for aftercare following kidney transplant: Secondary | ICD-10-CM | POA: Diagnosis not present

## 2015-02-12 DIAGNOSIS — Z7982 Long term (current) use of aspirin: Secondary | ICD-10-CM | POA: Diagnosis not present

## 2015-02-12 DIAGNOSIS — D899 Disorder involving the immune mechanism, unspecified: Secondary | ICD-10-CM | POA: Diagnosis not present

## 2015-02-12 DIAGNOSIS — Z23 Encounter for immunization: Secondary | ICD-10-CM | POA: Diagnosis not present

## 2015-02-12 DIAGNOSIS — Z48298 Encounter for aftercare following other organ transplant: Secondary | ICD-10-CM | POA: Diagnosis not present

## 2015-02-12 DIAGNOSIS — Z79899 Other long term (current) drug therapy: Secondary | ICD-10-CM | POA: Diagnosis not present

## 2015-02-12 DIAGNOSIS — Z940 Kidney transplant status: Secondary | ICD-10-CM | POA: Diagnosis not present

## 2015-02-12 DIAGNOSIS — E1165 Type 2 diabetes mellitus with hyperglycemia: Secondary | ICD-10-CM | POA: Diagnosis not present

## 2015-02-12 DIAGNOSIS — E1142 Type 2 diabetes mellitus with diabetic polyneuropathy: Secondary | ICD-10-CM | POA: Diagnosis not present

## 2015-02-12 DIAGNOSIS — I2510 Atherosclerotic heart disease of native coronary artery without angina pectoris: Secondary | ICD-10-CM | POA: Diagnosis not present

## 2015-02-18 DIAGNOSIS — I10 Essential (primary) hypertension: Secondary | ICD-10-CM | POA: Diagnosis not present

## 2015-02-18 DIAGNOSIS — E782 Mixed hyperlipidemia: Secondary | ICD-10-CM | POA: Diagnosis not present

## 2015-02-18 DIAGNOSIS — I2510 Atherosclerotic heart disease of native coronary artery without angina pectoris: Secondary | ICD-10-CM | POA: Diagnosis not present

## 2015-03-03 DIAGNOSIS — Z789 Other specified health status: Secondary | ICD-10-CM | POA: Diagnosis not present

## 2015-03-03 DIAGNOSIS — Z09 Encounter for follow-up examination after completed treatment for conditions other than malignant neoplasm: Secondary | ICD-10-CM | POA: Diagnosis not present

## 2015-03-03 DIAGNOSIS — N390 Urinary tract infection, site not specified: Secondary | ICD-10-CM | POA: Diagnosis not present

## 2015-03-03 DIAGNOSIS — Z940 Kidney transplant status: Secondary | ICD-10-CM | POA: Diagnosis not present

## 2015-03-03 DIAGNOSIS — E1129 Type 2 diabetes mellitus with other diabetic kidney complication: Secondary | ICD-10-CM | POA: Diagnosis not present

## 2015-03-03 DIAGNOSIS — E559 Vitamin D deficiency, unspecified: Secondary | ICD-10-CM | POA: Diagnosis not present

## 2015-03-03 DIAGNOSIS — D631 Anemia in chronic kidney disease: Secondary | ICD-10-CM | POA: Diagnosis not present

## 2015-03-03 DIAGNOSIS — Z114 Encounter for screening for human immunodeficiency virus [HIV]: Secondary | ICD-10-CM | POA: Diagnosis not present

## 2015-03-03 DIAGNOSIS — Z79899 Other long term (current) drug therapy: Secondary | ICD-10-CM | POA: Diagnosis not present

## 2015-03-17 DIAGNOSIS — E113592 Type 2 diabetes mellitus with proliferative diabetic retinopathy without macular edema, left eye: Secondary | ICD-10-CM | POA: Diagnosis not present

## 2015-03-18 DIAGNOSIS — H401123 Primary open-angle glaucoma, left eye, severe stage: Secondary | ICD-10-CM | POA: Diagnosis not present

## 2015-03-19 DIAGNOSIS — Z940 Kidney transplant status: Secondary | ICD-10-CM | POA: Diagnosis not present

## 2015-03-19 DIAGNOSIS — Z79899 Other long term (current) drug therapy: Secondary | ICD-10-CM | POA: Diagnosis not present

## 2015-03-19 DIAGNOSIS — Z789 Other specified health status: Secondary | ICD-10-CM | POA: Diagnosis not present

## 2015-03-19 DIAGNOSIS — Z114 Encounter for screening for human immunodeficiency virus [HIV]: Secondary | ICD-10-CM | POA: Diagnosis not present

## 2015-03-19 DIAGNOSIS — Z09 Encounter for follow-up examination after completed treatment for conditions other than malignant neoplasm: Secondary | ICD-10-CM | POA: Diagnosis not present

## 2015-03-19 DIAGNOSIS — E559 Vitamin D deficiency, unspecified: Secondary | ICD-10-CM | POA: Diagnosis not present

## 2015-03-19 DIAGNOSIS — N390 Urinary tract infection, site not specified: Secondary | ICD-10-CM | POA: Diagnosis not present

## 2015-03-19 DIAGNOSIS — E1129 Type 2 diabetes mellitus with other diabetic kidney complication: Secondary | ICD-10-CM | POA: Diagnosis not present

## 2015-03-19 DIAGNOSIS — D631 Anemia in chronic kidney disease: Secondary | ICD-10-CM | POA: Diagnosis not present

## 2015-04-02 DIAGNOSIS — E559 Vitamin D deficiency, unspecified: Secondary | ICD-10-CM | POA: Diagnosis not present

## 2015-04-02 DIAGNOSIS — Z114 Encounter for screening for human immunodeficiency virus [HIV]: Secondary | ICD-10-CM | POA: Diagnosis not present

## 2015-04-02 DIAGNOSIS — Z79899 Other long term (current) drug therapy: Secondary | ICD-10-CM | POA: Diagnosis not present

## 2015-04-02 DIAGNOSIS — E1129 Type 2 diabetes mellitus with other diabetic kidney complication: Secondary | ICD-10-CM | POA: Diagnosis not present

## 2015-04-02 DIAGNOSIS — Z940 Kidney transplant status: Secondary | ICD-10-CM | POA: Diagnosis not present

## 2015-04-15 DIAGNOSIS — E1129 Type 2 diabetes mellitus with other diabetic kidney complication: Secondary | ICD-10-CM | POA: Diagnosis not present

## 2015-04-15 DIAGNOSIS — Z940 Kidney transplant status: Secondary | ICD-10-CM | POA: Diagnosis not present

## 2015-04-15 DIAGNOSIS — D631 Anemia in chronic kidney disease: Secondary | ICD-10-CM | POA: Diagnosis not present

## 2015-04-15 DIAGNOSIS — E559 Vitamin D deficiency, unspecified: Secondary | ICD-10-CM | POA: Diagnosis not present

## 2015-04-15 DIAGNOSIS — N390 Urinary tract infection, site not specified: Secondary | ICD-10-CM | POA: Diagnosis not present

## 2015-04-15 DIAGNOSIS — Z79899 Other long term (current) drug therapy: Secondary | ICD-10-CM | POA: Diagnosis not present

## 2015-04-28 DIAGNOSIS — Z789 Other specified health status: Secondary | ICD-10-CM | POA: Diagnosis not present

## 2015-04-28 DIAGNOSIS — T8610 Unspecified complication of kidney transplant: Secondary | ICD-10-CM | POA: Diagnosis not present

## 2015-04-28 DIAGNOSIS — Z79899 Other long term (current) drug therapy: Secondary | ICD-10-CM | POA: Diagnosis not present

## 2015-04-28 DIAGNOSIS — Z940 Kidney transplant status: Secondary | ICD-10-CM | POA: Diagnosis not present

## 2015-04-28 DIAGNOSIS — E559 Vitamin D deficiency, unspecified: Secondary | ICD-10-CM | POA: Diagnosis not present

## 2015-04-28 DIAGNOSIS — N390 Urinary tract infection, site not specified: Secondary | ICD-10-CM | POA: Diagnosis not present

## 2015-04-28 DIAGNOSIS — E1129 Type 2 diabetes mellitus with other diabetic kidney complication: Secondary | ICD-10-CM | POA: Diagnosis not present

## 2015-04-28 DIAGNOSIS — Z114 Encounter for screening for human immunodeficiency virus [HIV]: Secondary | ICD-10-CM | POA: Diagnosis not present

## 2015-04-28 DIAGNOSIS — D631 Anemia in chronic kidney disease: Secondary | ICD-10-CM | POA: Diagnosis not present

## 2015-04-28 DIAGNOSIS — Z09 Encounter for follow-up examination after completed treatment for conditions other than malignant neoplasm: Secondary | ICD-10-CM | POA: Diagnosis not present

## 2015-05-13 DIAGNOSIS — Z940 Kidney transplant status: Secondary | ICD-10-CM | POA: Diagnosis not present

## 2015-05-13 DIAGNOSIS — N390 Urinary tract infection, site not specified: Secondary | ICD-10-CM | POA: Diagnosis not present

## 2015-05-13 DIAGNOSIS — E559 Vitamin D deficiency, unspecified: Secondary | ICD-10-CM | POA: Diagnosis not present

## 2015-05-13 DIAGNOSIS — Z789 Other specified health status: Secondary | ICD-10-CM | POA: Diagnosis not present

## 2015-05-13 DIAGNOSIS — D631 Anemia in chronic kidney disease: Secondary | ICD-10-CM | POA: Diagnosis not present

## 2015-05-13 DIAGNOSIS — Z114 Encounter for screening for human immunodeficiency virus [HIV]: Secondary | ICD-10-CM | POA: Diagnosis not present

## 2015-05-13 DIAGNOSIS — E1129 Type 2 diabetes mellitus with other diabetic kidney complication: Secondary | ICD-10-CM | POA: Diagnosis not present

## 2015-05-13 DIAGNOSIS — Z79899 Other long term (current) drug therapy: Secondary | ICD-10-CM | POA: Diagnosis not present

## 2015-05-26 DIAGNOSIS — Z789 Other specified health status: Secondary | ICD-10-CM | POA: Diagnosis not present

## 2015-05-26 DIAGNOSIS — N390 Urinary tract infection, site not specified: Secondary | ICD-10-CM | POA: Diagnosis not present

## 2015-05-26 DIAGNOSIS — Z114 Encounter for screening for human immunodeficiency virus [HIV]: Secondary | ICD-10-CM | POA: Diagnosis not present

## 2015-05-26 DIAGNOSIS — Z79899 Other long term (current) drug therapy: Secondary | ICD-10-CM | POA: Diagnosis not present

## 2015-05-26 DIAGNOSIS — T8610 Unspecified complication of kidney transplant: Secondary | ICD-10-CM | POA: Diagnosis not present

## 2015-05-26 DIAGNOSIS — Z09 Encounter for follow-up examination after completed treatment for conditions other than malignant neoplasm: Secondary | ICD-10-CM | POA: Diagnosis not present

## 2015-05-26 DIAGNOSIS — E559 Vitamin D deficiency, unspecified: Secondary | ICD-10-CM | POA: Diagnosis not present

## 2015-05-26 DIAGNOSIS — E1129 Type 2 diabetes mellitus with other diabetic kidney complication: Secondary | ICD-10-CM | POA: Diagnosis not present

## 2015-05-26 DIAGNOSIS — Z940 Kidney transplant status: Secondary | ICD-10-CM | POA: Diagnosis not present

## 2015-05-26 DIAGNOSIS — D631 Anemia in chronic kidney disease: Secondary | ICD-10-CM | POA: Diagnosis not present

## 2015-06-04 DIAGNOSIS — Z6828 Body mass index (BMI) 28.0-28.9, adult: Secondary | ICD-10-CM | POA: Diagnosis not present

## 2015-06-04 DIAGNOSIS — Z794 Long term (current) use of insulin: Secondary | ICD-10-CM | POA: Diagnosis not present

## 2015-06-04 DIAGNOSIS — Z48298 Encounter for aftercare following other organ transplant: Secondary | ICD-10-CM | POA: Diagnosis not present

## 2015-06-04 DIAGNOSIS — H5441 Blindness, right eye, normal vision left eye: Secondary | ICD-10-CM | POA: Diagnosis not present

## 2015-06-04 DIAGNOSIS — Z79899 Other long term (current) drug therapy: Secondary | ICD-10-CM | POA: Diagnosis not present

## 2015-06-04 DIAGNOSIS — E1165 Type 2 diabetes mellitus with hyperglycemia: Secondary | ICD-10-CM | POA: Diagnosis not present

## 2015-06-04 DIAGNOSIS — Z4822 Encounter for aftercare following kidney transplant: Secondary | ICD-10-CM | POA: Diagnosis not present

## 2015-06-04 DIAGNOSIS — D899 Disorder involving the immune mechanism, unspecified: Secondary | ICD-10-CM | POA: Diagnosis not present

## 2015-06-04 DIAGNOSIS — Z940 Kidney transplant status: Secondary | ICD-10-CM | POA: Diagnosis not present

## 2015-06-04 DIAGNOSIS — I10 Essential (primary) hypertension: Secondary | ICD-10-CM | POA: Diagnosis not present

## 2015-06-10 DIAGNOSIS — D631 Anemia in chronic kidney disease: Secondary | ICD-10-CM | POA: Diagnosis not present

## 2015-06-10 DIAGNOSIS — Z940 Kidney transplant status: Secondary | ICD-10-CM | POA: Diagnosis not present

## 2015-06-10 DIAGNOSIS — Z789 Other specified health status: Secondary | ICD-10-CM | POA: Diagnosis not present

## 2015-06-10 DIAGNOSIS — Z09 Encounter for follow-up examination after completed treatment for conditions other than malignant neoplasm: Secondary | ICD-10-CM | POA: Diagnosis not present

## 2015-06-10 DIAGNOSIS — T8610 Unspecified complication of kidney transplant: Secondary | ICD-10-CM | POA: Diagnosis not present

## 2015-06-10 DIAGNOSIS — E559 Vitamin D deficiency, unspecified: Secondary | ICD-10-CM | POA: Diagnosis not present

## 2015-06-10 DIAGNOSIS — E1129 Type 2 diabetes mellitus with other diabetic kidney complication: Secondary | ICD-10-CM | POA: Diagnosis not present

## 2015-06-10 DIAGNOSIS — Z79899 Other long term (current) drug therapy: Secondary | ICD-10-CM | POA: Diagnosis not present

## 2015-06-10 DIAGNOSIS — N390 Urinary tract infection, site not specified: Secondary | ICD-10-CM | POA: Diagnosis not present

## 2015-06-10 DIAGNOSIS — Z114 Encounter for screening for human immunodeficiency virus [HIV]: Secondary | ICD-10-CM | POA: Diagnosis not present

## 2015-06-23 DIAGNOSIS — E113592 Type 2 diabetes mellitus with proliferative diabetic retinopathy without macular edema, left eye: Secondary | ICD-10-CM | POA: Diagnosis not present

## 2015-07-14 DIAGNOSIS — Z79899 Other long term (current) drug therapy: Secondary | ICD-10-CM | POA: Diagnosis not present

## 2015-07-14 DIAGNOSIS — N390 Urinary tract infection, site not specified: Secondary | ICD-10-CM | POA: Diagnosis not present

## 2015-07-14 DIAGNOSIS — E559 Vitamin D deficiency, unspecified: Secondary | ICD-10-CM | POA: Diagnosis not present

## 2015-07-14 DIAGNOSIS — D631 Anemia in chronic kidney disease: Secondary | ICD-10-CM | POA: Diagnosis not present

## 2015-07-14 DIAGNOSIS — E1129 Type 2 diabetes mellitus with other diabetic kidney complication: Secondary | ICD-10-CM | POA: Diagnosis not present

## 2015-07-14 DIAGNOSIS — Z789 Other specified health status: Secondary | ICD-10-CM | POA: Diagnosis not present

## 2015-07-14 DIAGNOSIS — T8610 Unspecified complication of kidney transplant: Secondary | ICD-10-CM | POA: Diagnosis not present

## 2015-07-14 DIAGNOSIS — Z09 Encounter for follow-up examination after completed treatment for conditions other than malignant neoplasm: Secondary | ICD-10-CM | POA: Diagnosis not present

## 2015-07-14 DIAGNOSIS — Z940 Kidney transplant status: Secondary | ICD-10-CM | POA: Diagnosis not present

## 2015-07-14 DIAGNOSIS — Z114 Encounter for screening for human immunodeficiency virus [HIV]: Secondary | ICD-10-CM | POA: Diagnosis not present

## 2015-08-05 DIAGNOSIS — Z940 Kidney transplant status: Secondary | ICD-10-CM | POA: Diagnosis not present

## 2015-11-11 DIAGNOSIS — N261 Atrophy of kidney (terminal): Secondary | ICD-10-CM | POA: Diagnosis not present

## 2015-11-11 DIAGNOSIS — K219 Gastro-esophageal reflux disease without esophagitis: Secondary | ICD-10-CM | POA: Diagnosis not present

## 2015-11-11 DIAGNOSIS — Z79899 Other long term (current) drug therapy: Secondary | ICD-10-CM | POA: Diagnosis not present

## 2015-11-11 DIAGNOSIS — T8610 Unspecified complication of kidney transplant: Secondary | ICD-10-CM | POA: Diagnosis present

## 2015-11-11 DIAGNOSIS — E11649 Type 2 diabetes mellitus with hypoglycemia without coma: Secondary | ICD-10-CM | POA: Diagnosis present

## 2015-11-11 DIAGNOSIS — I120 Hypertensive chronic kidney disease with stage 5 chronic kidney disease or end stage renal disease: Secondary | ICD-10-CM | POA: Diagnosis present

## 2015-11-11 DIAGNOSIS — Z940 Kidney transplant status: Secondary | ICD-10-CM | POA: Diagnosis not present

## 2015-11-11 DIAGNOSIS — E1165 Type 2 diabetes mellitus with hyperglycemia: Secondary | ICD-10-CM | POA: Diagnosis not present

## 2015-11-11 DIAGNOSIS — E1121 Type 2 diabetes mellitus with diabetic nephropathy: Secondary | ICD-10-CM | POA: Diagnosis present

## 2015-11-11 DIAGNOSIS — Z794 Long term (current) use of insulin: Secondary | ICD-10-CM | POA: Diagnosis not present

## 2015-11-11 DIAGNOSIS — E1122 Type 2 diabetes mellitus with diabetic chronic kidney disease: Secondary | ICD-10-CM | POA: Diagnosis present

## 2015-11-11 DIAGNOSIS — E11319 Type 2 diabetes mellitus with unspecified diabetic retinopathy without macular edema: Secondary | ICD-10-CM | POA: Diagnosis present

## 2015-11-11 DIAGNOSIS — N170 Acute kidney failure with tubular necrosis: Secondary | ICD-10-CM | POA: Diagnosis not present

## 2015-11-11 DIAGNOSIS — Z992 Dependence on renal dialysis: Secondary | ICD-10-CM | POA: Diagnosis not present

## 2015-11-11 DIAGNOSIS — Z91128 Patient's intentional underdosing of medication regimen for other reason: Secondary | ICD-10-CM | POA: Diagnosis not present

## 2015-11-11 DIAGNOSIS — Z7984 Long term (current) use of oral hypoglycemic drugs: Secondary | ICD-10-CM | POA: Diagnosis not present

## 2015-11-11 DIAGNOSIS — R7989 Other specified abnormal findings of blood chemistry: Secondary | ICD-10-CM | POA: Diagnosis not present

## 2015-11-11 DIAGNOSIS — E875 Hyperkalemia: Secondary | ICD-10-CM | POA: Diagnosis not present

## 2015-11-11 DIAGNOSIS — I2510 Atherosclerotic heart disease of native coronary artery without angina pectoris: Secondary | ICD-10-CM | POA: Diagnosis not present

## 2015-11-11 DIAGNOSIS — H5441 Blindness, right eye, normal vision left eye: Secondary | ICD-10-CM | POA: Diagnosis present

## 2015-11-11 DIAGNOSIS — N179 Acute kidney failure, unspecified: Secondary | ICD-10-CM | POA: Diagnosis not present

## 2015-11-11 DIAGNOSIS — Z7982 Long term (current) use of aspirin: Secondary | ICD-10-CM | POA: Diagnosis not present

## 2015-11-11 DIAGNOSIS — E1142 Type 2 diabetes mellitus with diabetic polyneuropathy: Secondary | ICD-10-CM | POA: Diagnosis present

## 2015-11-11 DIAGNOSIS — N99840 Postprocedural hematoma of a genitourinary system organ or structure following a genitourinary system procedure: Secondary | ICD-10-CM | POA: Diagnosis not present

## 2015-11-11 DIAGNOSIS — I10 Essential (primary) hypertension: Secondary | ICD-10-CM | POA: Diagnosis not present

## 2015-11-11 DIAGNOSIS — N186 End stage renal disease: Secondary | ICD-10-CM | POA: Diagnosis not present

## 2015-11-11 DIAGNOSIS — T383X6A Underdosing of insulin and oral hypoglycemic [antidiabetic] drugs, initial encounter: Secondary | ICD-10-CM | POA: Diagnosis present

## 2015-11-11 DIAGNOSIS — T451X6A Underdosing of antineoplastic and immunosuppressive drugs, initial encounter: Secondary | ICD-10-CM | POA: Diagnosis present

## 2015-11-12 DIAGNOSIS — T8611 Kidney transplant rejection: Secondary | ICD-10-CM | POA: Insufficient documentation

## 2015-11-17 DIAGNOSIS — Z940 Kidney transplant status: Secondary | ICD-10-CM | POA: Diagnosis not present

## 2015-12-17 DIAGNOSIS — Z48298 Encounter for aftercare following other organ transplant: Secondary | ICD-10-CM | POA: Diagnosis not present

## 2015-12-17 DIAGNOSIS — E11319 Type 2 diabetes mellitus with unspecified diabetic retinopathy without macular edema: Secondary | ICD-10-CM | POA: Diagnosis not present

## 2015-12-17 DIAGNOSIS — Z7982 Long term (current) use of aspirin: Secondary | ICD-10-CM | POA: Diagnosis not present

## 2015-12-17 DIAGNOSIS — Z794 Long term (current) use of insulin: Secondary | ICD-10-CM | POA: Diagnosis not present

## 2015-12-17 DIAGNOSIS — N186 End stage renal disease: Secondary | ICD-10-CM | POA: Diagnosis not present

## 2015-12-17 DIAGNOSIS — Z940 Kidney transplant status: Secondary | ICD-10-CM | POA: Diagnosis not present

## 2015-12-17 DIAGNOSIS — I2510 Atherosclerotic heart disease of native coronary artery without angina pectoris: Secondary | ICD-10-CM | POA: Diagnosis not present

## 2015-12-17 DIAGNOSIS — E1142 Type 2 diabetes mellitus with diabetic polyneuropathy: Secondary | ICD-10-CM | POA: Diagnosis not present

## 2015-12-17 DIAGNOSIS — Z7984 Long term (current) use of oral hypoglycemic drugs: Secondary | ICD-10-CM | POA: Diagnosis not present

## 2015-12-17 DIAGNOSIS — Z6828 Body mass index (BMI) 28.0-28.9, adult: Secondary | ICD-10-CM | POA: Diagnosis not present

## 2015-12-17 DIAGNOSIS — Z79899 Other long term (current) drug therapy: Secondary | ICD-10-CM | POA: Diagnosis not present

## 2015-12-17 DIAGNOSIS — I120 Hypertensive chronic kidney disease with stage 5 chronic kidney disease or end stage renal disease: Secondary | ICD-10-CM | POA: Diagnosis not present

## 2015-12-17 DIAGNOSIS — I10 Essential (primary) hypertension: Secondary | ICD-10-CM | POA: Diagnosis not present

## 2015-12-17 DIAGNOSIS — E785 Hyperlipidemia, unspecified: Secondary | ICD-10-CM | POA: Diagnosis not present

## 2015-12-17 DIAGNOSIS — D899 Disorder involving the immune mechanism, unspecified: Secondary | ICD-10-CM | POA: Diagnosis not present

## 2015-12-17 DIAGNOSIS — E1122 Type 2 diabetes mellitus with diabetic chronic kidney disease: Secondary | ICD-10-CM | POA: Diagnosis not present

## 2015-12-25 DIAGNOSIS — Z940 Kidney transplant status: Secondary | ICD-10-CM | POA: Diagnosis not present

## 2016-01-05 DIAGNOSIS — K219 Gastro-esophageal reflux disease without esophagitis: Secondary | ICD-10-CM | POA: Diagnosis not present

## 2016-01-05 DIAGNOSIS — D8989 Other specified disorders involving the immune mechanism, not elsewhere classified: Secondary | ICD-10-CM | POA: Diagnosis not present

## 2016-01-05 DIAGNOSIS — Z940 Kidney transplant status: Secondary | ICD-10-CM | POA: Diagnosis not present

## 2016-01-05 DIAGNOSIS — H409 Unspecified glaucoma: Secondary | ICD-10-CM | POA: Diagnosis not present

## 2016-01-05 DIAGNOSIS — I10 Essential (primary) hypertension: Secondary | ICD-10-CM | POA: Diagnosis not present

## 2016-01-05 DIAGNOSIS — Z23 Encounter for immunization: Secondary | ICD-10-CM | POA: Diagnosis not present

## 2016-01-05 DIAGNOSIS — Z6826 Body mass index (BMI) 26.0-26.9, adult: Secondary | ICD-10-CM | POA: Diagnosis not present

## 2016-01-05 DIAGNOSIS — R808 Other proteinuria: Secondary | ICD-10-CM | POA: Diagnosis not present

## 2016-01-05 DIAGNOSIS — I129 Hypertensive chronic kidney disease with stage 1 through stage 4 chronic kidney disease, or unspecified chronic kidney disease: Secondary | ICD-10-CM | POA: Diagnosis not present

## 2016-01-05 DIAGNOSIS — E663 Overweight: Secondary | ICD-10-CM | POA: Diagnosis not present

## 2016-01-05 DIAGNOSIS — E1129 Type 2 diabetes mellitus with other diabetic kidney complication: Secondary | ICD-10-CM | POA: Diagnosis not present

## 2016-01-22 DIAGNOSIS — Z6828 Body mass index (BMI) 28.0-28.9, adult: Secondary | ICD-10-CM | POA: Diagnosis not present

## 2016-01-22 DIAGNOSIS — E1129 Type 2 diabetes mellitus with other diabetic kidney complication: Secondary | ICD-10-CM | POA: Diagnosis not present

## 2016-01-22 DIAGNOSIS — I10 Essential (primary) hypertension: Secondary | ICD-10-CM | POA: Diagnosis not present

## 2016-01-22 DIAGNOSIS — Z940 Kidney transplant status: Secondary | ICD-10-CM | POA: Diagnosis not present

## 2016-02-05 ENCOUNTER — Telehealth: Payer: Self-pay | Admitting: Nephrology

## 2016-02-05 DIAGNOSIS — E1129 Type 2 diabetes mellitus with other diabetic kidney complication: Secondary | ICD-10-CM | POA: Diagnosis not present

## 2016-02-05 DIAGNOSIS — Z940 Kidney transplant status: Secondary | ICD-10-CM | POA: Diagnosis not present

## 2016-02-05 DIAGNOSIS — I10 Essential (primary) hypertension: Secondary | ICD-10-CM | POA: Diagnosis not present

## 2016-02-05 DIAGNOSIS — R809 Proteinuria, unspecified: Secondary | ICD-10-CM | POA: Diagnosis not present

## 2016-02-05 DIAGNOSIS — E663 Overweight: Secondary | ICD-10-CM | POA: Diagnosis not present

## 2016-02-05 DIAGNOSIS — I2510 Atherosclerotic heart disease of native coronary artery without angina pectoris: Secondary | ICD-10-CM | POA: Diagnosis not present

## 2016-02-05 NOTE — Telephone Encounter (Signed)
Located in [place] for the purposes of post renal transplant aftercare.

## 2016-02-11 DIAGNOSIS — E1129 Type 2 diabetes mellitus with other diabetic kidney complication: Secondary | ICD-10-CM | POA: Diagnosis not present

## 2016-03-02 DIAGNOSIS — E113592 Type 2 diabetes mellitus with proliferative diabetic retinopathy without macular edema, left eye: Secondary | ICD-10-CM | POA: Diagnosis not present

## 2016-03-02 DIAGNOSIS — E113591 Type 2 diabetes mellitus with proliferative diabetic retinopathy without macular edema, right eye: Secondary | ICD-10-CM | POA: Diagnosis not present

## 2016-03-02 DIAGNOSIS — H25012 Cortical age-related cataract, left eye: Secondary | ICD-10-CM | POA: Diagnosis not present

## 2016-03-02 DIAGNOSIS — Z0100 Encounter for examination of eyes and vision without abnormal findings: Secondary | ICD-10-CM | POA: Diagnosis not present

## 2016-03-04 DIAGNOSIS — E1129 Type 2 diabetes mellitus with other diabetic kidney complication: Secondary | ICD-10-CM | POA: Diagnosis not present

## 2016-03-04 DIAGNOSIS — I10 Essential (primary) hypertension: Secondary | ICD-10-CM | POA: Diagnosis not present

## 2016-03-04 DIAGNOSIS — I129 Hypertensive chronic kidney disease with stage 1 through stage 4 chronic kidney disease, or unspecified chronic kidney disease: Secondary | ICD-10-CM | POA: Diagnosis not present

## 2016-03-04 DIAGNOSIS — Z6828 Body mass index (BMI) 28.0-28.9, adult: Secondary | ICD-10-CM | POA: Diagnosis not present

## 2016-03-24 DIAGNOSIS — Z940 Kidney transplant status: Secondary | ICD-10-CM | POA: Diagnosis not present

## 2016-03-24 DIAGNOSIS — Z4822 Encounter for aftercare following kidney transplant: Secondary | ICD-10-CM | POA: Diagnosis not present

## 2016-03-24 DIAGNOSIS — M25512 Pain in left shoulder: Secondary | ICD-10-CM | POA: Diagnosis not present

## 2016-03-24 DIAGNOSIS — D899 Disorder involving the immune mechanism, unspecified: Secondary | ICD-10-CM | POA: Diagnosis not present

## 2016-03-24 DIAGNOSIS — E1140 Type 2 diabetes mellitus with diabetic neuropathy, unspecified: Secondary | ICD-10-CM | POA: Diagnosis not present

## 2016-03-24 DIAGNOSIS — I2510 Atherosclerotic heart disease of native coronary artery without angina pectoris: Secondary | ICD-10-CM | POA: Diagnosis not present

## 2016-03-24 DIAGNOSIS — E1122 Type 2 diabetes mellitus with diabetic chronic kidney disease: Secondary | ICD-10-CM | POA: Diagnosis not present

## 2016-03-24 DIAGNOSIS — Z6830 Body mass index (BMI) 30.0-30.9, adult: Secondary | ICD-10-CM | POA: Diagnosis not present

## 2016-03-24 DIAGNOSIS — Z794 Long term (current) use of insulin: Secondary | ICD-10-CM | POA: Diagnosis not present

## 2016-03-24 DIAGNOSIS — N170 Acute kidney failure with tubular necrosis: Secondary | ICD-10-CM | POA: Diagnosis not present

## 2016-03-24 DIAGNOSIS — I10 Essential (primary) hypertension: Secondary | ICD-10-CM | POA: Diagnosis not present

## 2016-03-24 DIAGNOSIS — H54415A Blindness right eye category 5, normal vision left eye: Secondary | ICD-10-CM | POA: Diagnosis not present

## 2016-03-24 DIAGNOSIS — E785 Hyperlipidemia, unspecified: Secondary | ICD-10-CM | POA: Diagnosis not present

## 2016-03-24 DIAGNOSIS — Z79899 Other long term (current) drug therapy: Secondary | ICD-10-CM | POA: Diagnosis not present

## 2016-03-24 DIAGNOSIS — Z7982 Long term (current) use of aspirin: Secondary | ICD-10-CM | POA: Diagnosis not present

## 2016-03-24 DIAGNOSIS — Z48298 Encounter for aftercare following other organ transplant: Secondary | ICD-10-CM | POA: Diagnosis not present

## 2016-03-24 DIAGNOSIS — N186 End stage renal disease: Secondary | ICD-10-CM | POA: Diagnosis not present

## 2016-05-27 DIAGNOSIS — E663 Overweight: Secondary | ICD-10-CM | POA: Diagnosis not present

## 2016-05-27 DIAGNOSIS — Z6829 Body mass index (BMI) 29.0-29.9, adult: Secondary | ICD-10-CM | POA: Diagnosis not present

## 2016-05-27 DIAGNOSIS — Z20828 Contact with and (suspected) exposure to other viral communicable diseases: Secondary | ICD-10-CM | POA: Diagnosis not present

## 2016-05-27 DIAGNOSIS — I129 Hypertensive chronic kidney disease with stage 1 through stage 4 chronic kidney disease, or unspecified chronic kidney disease: Secondary | ICD-10-CM | POA: Diagnosis not present

## 2016-05-27 DIAGNOSIS — E1129 Type 2 diabetes mellitus with other diabetic kidney complication: Secondary | ICD-10-CM | POA: Diagnosis not present

## 2016-05-27 DIAGNOSIS — I10 Essential (primary) hypertension: Secondary | ICD-10-CM | POA: Diagnosis not present

## 2016-05-27 DIAGNOSIS — Z940 Kidney transplant status: Secondary | ICD-10-CM | POA: Diagnosis not present

## 2016-05-27 DIAGNOSIS — D649 Anemia, unspecified: Secondary | ICD-10-CM | POA: Diagnosis not present

## 2016-05-27 DIAGNOSIS — E785 Hyperlipidemia, unspecified: Secondary | ICD-10-CM | POA: Diagnosis not present

## 2016-05-27 DIAGNOSIS — D899 Disorder involving the immune mechanism, unspecified: Secondary | ICD-10-CM | POA: Diagnosis not present

## 2016-06-15 DIAGNOSIS — H401122 Primary open-angle glaucoma, left eye, moderate stage: Secondary | ICD-10-CM | POA: Diagnosis not present

## 2016-06-21 DIAGNOSIS — Z940 Kidney transplant status: Secondary | ICD-10-CM | POA: Diagnosis not present

## 2016-07-14 DIAGNOSIS — K219 Gastro-esophageal reflux disease without esophagitis: Secondary | ICD-10-CM | POA: Diagnosis not present

## 2016-07-14 DIAGNOSIS — E1129 Type 2 diabetes mellitus with other diabetic kidney complication: Secondary | ICD-10-CM | POA: Diagnosis not present

## 2016-07-14 DIAGNOSIS — H5410 Blindness, one eye, low vision other eye, unspecified eyes: Secondary | ICD-10-CM | POA: Diagnosis not present

## 2016-07-14 DIAGNOSIS — D8989 Other specified disorders involving the immune mechanism, not elsewhere classified: Secondary | ICD-10-CM | POA: Diagnosis not present

## 2016-07-14 DIAGNOSIS — Z940 Kidney transplant status: Secondary | ICD-10-CM | POA: Diagnosis not present

## 2016-07-14 DIAGNOSIS — Z6830 Body mass index (BMI) 30.0-30.9, adult: Secondary | ICD-10-CM | POA: Diagnosis not present

## 2016-07-14 DIAGNOSIS — H4089 Other specified glaucoma: Secondary | ICD-10-CM | POA: Diagnosis not present

## 2016-07-14 DIAGNOSIS — R808 Other proteinuria: Secondary | ICD-10-CM | POA: Diagnosis not present

## 2016-07-14 DIAGNOSIS — E663 Overweight: Secondary | ICD-10-CM | POA: Diagnosis not present

## 2016-07-14 DIAGNOSIS — I129 Hypertensive chronic kidney disease with stage 1 through stage 4 chronic kidney disease, or unspecified chronic kidney disease: Secondary | ICD-10-CM | POA: Diagnosis not present

## 2016-07-14 DIAGNOSIS — I10 Essential (primary) hypertension: Secondary | ICD-10-CM | POA: Diagnosis not present

## 2016-07-19 DIAGNOSIS — Z940 Kidney transplant status: Secondary | ICD-10-CM | POA: Diagnosis not present

## 2016-08-03 DIAGNOSIS — H401122 Primary open-angle glaucoma, left eye, moderate stage: Secondary | ICD-10-CM | POA: Diagnosis not present

## 2016-08-26 DIAGNOSIS — E1129 Type 2 diabetes mellitus with other diabetic kidney complication: Secondary | ICD-10-CM | POA: Diagnosis not present

## 2016-08-26 DIAGNOSIS — I10 Essential (primary) hypertension: Secondary | ICD-10-CM | POA: Diagnosis not present

## 2016-08-26 DIAGNOSIS — B351 Tinea unguium: Secondary | ICD-10-CM | POA: Diagnosis not present

## 2016-08-26 DIAGNOSIS — Z6831 Body mass index (BMI) 31.0-31.9, adult: Secondary | ICD-10-CM | POA: Diagnosis not present

## 2016-09-09 ENCOUNTER — Ambulatory Visit (INDEPENDENT_AMBULATORY_CARE_PROVIDER_SITE_OTHER): Payer: Medicare Other | Admitting: Podiatry

## 2016-09-09 ENCOUNTER — Encounter: Payer: Self-pay | Admitting: Podiatry

## 2016-09-09 VITALS — Resp 16 | Ht 69.0 in | Wt 205.0 lb

## 2016-09-09 DIAGNOSIS — M79605 Pain in left leg: Secondary | ICD-10-CM | POA: Diagnosis not present

## 2016-09-09 DIAGNOSIS — E1140 Type 2 diabetes mellitus with diabetic neuropathy, unspecified: Secondary | ICD-10-CM | POA: Diagnosis not present

## 2016-09-09 DIAGNOSIS — M2042 Other hammer toe(s) (acquired), left foot: Secondary | ICD-10-CM | POA: Diagnosis not present

## 2016-09-09 DIAGNOSIS — B351 Tinea unguium: Secondary | ICD-10-CM | POA: Diagnosis not present

## 2016-09-09 DIAGNOSIS — E1149 Type 2 diabetes mellitus with other diabetic neurological complication: Secondary | ICD-10-CM

## 2016-09-09 DIAGNOSIS — M2041 Other hammer toe(s) (acquired), right foot: Secondary | ICD-10-CM | POA: Diagnosis not present

## 2016-09-09 DIAGNOSIS — M79604 Pain in right leg: Secondary | ICD-10-CM

## 2016-09-09 NOTE — Progress Notes (Signed)
   Subjective:    Patient ID: Anthony Blanchard, male    DOB: August 10, 1975, 41 y.o.   MRN: [id number]  HPI Chief Complaint  Patient presents with  . Nail Problem    Bilateral; great toes; nail discoloration; x1.5 months;   . Debridement    Bilateral nail trim; pt Diabetic Type 2; Sugar=155 this am; A1C=7.4      Review of Systems  All other systems reviewed and are negative.      Objective:   Physical Exam        Assessment & Plan:

## 2016-09-09 NOTE — Progress Notes (Signed)
Subjective:    Patient ID: Anthony Blanchard, male   DOB: 41 y.o.   MRN: [id number]   HPI patient presents with long-term diabetes and kidney transplant who presents with nail disease bilateral and irritation on top the right big toe with history of swelling and admits that his sugar was in poor condition for a number of years    Review of Systems  All other systems reviewed and are negative.       Objective:  Physical Exam  Constitutional: He is oriented to person, place, and time.  Cardiovascular: Intact distal pulses.   Musculoskeletal: Normal range of motion.  Neurological: He is alert and oriented to person, place, and time.  Skin: Skin is warm and dry.  Nursing note and vitals reviewed.  vascular status was mildly diminished with significant loss of nerve function with diminished sharp dull and vibratory. Patient was found to have reduced tactile sensation and does have irritated skin right hallux with hammertoe deformity and nail disease 1-5 both feet that are thick yellow brittle and painful when pressed     Assessment:    At risk diabetic with mycotic nail infections lesion formation and long-term poor control     Plan:    H&P and conditions reviewed. He is at significant risk and does have structural deformity along with neuropathy and I do recommend diabetic shoes to provide for a wider deeper toe box and custom plantar insoles to reduce stress against his feet. I debrided nailbeds 1-5 both feet and instructed on daily inspections of his feet and if any redness drainage or other issues were to occur he is to reappoint [place] immediately

## 2016-09-24 DIAGNOSIS — E11319 Type 2 diabetes mellitus with unspecified diabetic retinopathy without macular edema: Secondary | ICD-10-CM | POA: Diagnosis not present

## 2016-09-24 DIAGNOSIS — Z4822 Encounter for aftercare following kidney transplant: Secondary | ICD-10-CM | POA: Diagnosis not present

## 2016-09-24 DIAGNOSIS — N271 Small kidney, bilateral: Secondary | ICD-10-CM | POA: Diagnosis not present

## 2016-09-24 DIAGNOSIS — Z48298 Encounter for aftercare following other organ transplant: Secondary | ICD-10-CM | POA: Diagnosis not present

## 2016-09-24 DIAGNOSIS — Z7982 Long term (current) use of aspirin: Secondary | ICD-10-CM | POA: Diagnosis not present

## 2016-09-24 DIAGNOSIS — E1142 Type 2 diabetes mellitus with diabetic polyneuropathy: Secondary | ICD-10-CM | POA: Diagnosis not present

## 2016-09-24 DIAGNOSIS — Z6832 Body mass index (BMI) 32.0-32.9, adult: Secondary | ICD-10-CM | POA: Diagnosis not present

## 2016-09-24 DIAGNOSIS — I10 Essential (primary) hypertension: Secondary | ICD-10-CM | POA: Diagnosis not present

## 2016-09-24 DIAGNOSIS — M25512 Pain in left shoulder: Secondary | ICD-10-CM | POA: Diagnosis not present

## 2016-09-24 DIAGNOSIS — E785 Hyperlipidemia, unspecified: Secondary | ICD-10-CM | POA: Diagnosis not present

## 2016-09-24 DIAGNOSIS — Z9889 Other specified postprocedural states: Secondary | ICD-10-CM | POA: Diagnosis not present

## 2016-09-24 DIAGNOSIS — I2510 Atherosclerotic heart disease of native coronary artery without angina pectoris: Secondary | ICD-10-CM | POA: Diagnosis not present

## 2016-09-24 DIAGNOSIS — E875 Hyperkalemia: Secondary | ICD-10-CM | POA: Diagnosis not present

## 2016-09-24 DIAGNOSIS — Z79899 Other long term (current) drug therapy: Secondary | ICD-10-CM | POA: Diagnosis not present

## 2016-09-24 DIAGNOSIS — Z940 Kidney transplant status: Secondary | ICD-10-CM | POA: Diagnosis not present

## 2016-09-24 DIAGNOSIS — Z794 Long term (current) use of insulin: Secondary | ICD-10-CM | POA: Diagnosis not present

## 2016-10-21 MED FILL — BD NANO PEN NEEDLE/32G/4MM/NDL: BD NANO PEN NEEDLE/32G/4MM/NDL | 25 days supply | Qty: 1 | Fill #5

## 2016-10-21 MED FILL — MYFORTIC/180MG/TAB: MYFORTIC/180MG/TAB | 30 days supply | Qty: 180 | Fill #3

## 2016-10-21 MED FILL — TACROLIMUS/1MG/CAPS: TACROLIMUS/1MG/CAPS | 30 days supply | Qty: 360 | Fill #3

## 2016-10-28 DIAGNOSIS — I10 Essential (primary) hypertension: Secondary | ICD-10-CM | POA: Diagnosis not present

## 2016-10-28 DIAGNOSIS — E1129 Type 2 diabetes mellitus with other diabetic kidney complication: Secondary | ICD-10-CM | POA: Diagnosis not present

## 2016-10-28 DIAGNOSIS — I129 Hypertensive chronic kidney disease with stage 1 through stage 4 chronic kidney disease, or unspecified chronic kidney disease: Secondary | ICD-10-CM | POA: Diagnosis not present

## 2016-11-08 DIAGNOSIS — E113532 Type 2 diabetes mellitus with proliferative diabetic retinopathy with traction retinal detachment not involving the macula, left eye: Secondary | ICD-10-CM | POA: Diagnosis not present

## 2016-11-08 DIAGNOSIS — H44521 Atrophy of globe, right eye: Secondary | ICD-10-CM | POA: Diagnosis not present

## 2016-11-15 DIAGNOSIS — E1129 Type 2 diabetes mellitus with other diabetic kidney complication: Secondary | ICD-10-CM | POA: Diagnosis not present

## 2016-11-15 DIAGNOSIS — Z940 Kidney transplant status: Secondary | ICD-10-CM | POA: Diagnosis not present

## 2016-11-15 DIAGNOSIS — E785 Hyperlipidemia, unspecified: Secondary | ICD-10-CM | POA: Diagnosis not present

## 2016-11-15 NOTE — Unmapped (Signed)
Specialty Pharmacy Refill Coordination Note     Benjamin Maldonado is a 41 y.o. male contacted today regarding refills of his specialty medication(s).    Reviewed and verified with patient:     [address]  [name]  [name] [address]  Specialty medication(s) and dose(s) confirmed: yes  Changes to medications: no  Changes to insurance: no    Medication Adherence    Patient reported X missed doses in the last month:  0  Specialty Medication:  TACROLIMUS 1 MG  Patient is on additional specialty medications:  Yes  Additional Specialty Medications:  MYFORTIC 180 MG  Patient Reported Additional Medication X Missed Doses in the Last Month:  0  Patient is on more than two specialty medications:  No  Medication Assistance Program  Refill Coordination  Has the Patient's Contact Information Changed:  No  Is the Shipping Address Different:  No  Shipping Information  Delivery Scheduled:  Yes  Delivery Date:  11/19/16  Medications to be Shipped:  NEEDLES  LANTUS  METOPROLOL  OMEPRAZOLE        Tacrolimus 1 mg   Quantity filled last month: 360   # of tablets left on hand: 168  Myfortic 180 mg   Quantity filled last month: 180   # of tablets left on hand: 84     [name]  Pension scheme manager

## 2016-11-18 MED FILL — METOPROLOL TARTRATE/50MG/TABS: METOPROLOL TARTRATE/50MG/TABS | 30 days supply | Qty: 90 | Fill #3

## 2016-11-18 MED FILL — BD NANO PEN NEEDLE/32G/4MM/NDL: BD NANO PEN NEEDLE/32G/4MM/NDL | 25 days supply | Qty: 1 | Fill #6

## 2016-11-18 MED FILL — OMEPRAZOLE/20MG/CAP: OMEPRAZOLE/20MG/CAP | 30 days supply | Qty: 30 | Fill #3

## 2016-11-18 MED FILL — MYFORTIC/180MG/TAB: MYFORTIC/180MG/TAB | 30 days supply | Qty: 180 | Fill #4

## 2016-11-18 MED FILL — TACROLIMUS/1MG/CAPS: TACROLIMUS/1MG/CAPS | 30 days supply | Qty: 360 | Fill #4

## 2016-11-23 NOTE — Unmapped (Signed)
UNOS form

## 2016-11-29 DIAGNOSIS — Z940 Kidney transplant status: Secondary | ICD-10-CM | POA: Diagnosis not present

## 2016-12-01 DIAGNOSIS — E1129 Type 2 diabetes mellitus with other diabetic kidney complication: Secondary | ICD-10-CM | POA: Diagnosis not present

## 2016-12-01 DIAGNOSIS — E663 Overweight: Secondary | ICD-10-CM | POA: Diagnosis not present

## 2016-12-01 DIAGNOSIS — D899 Disorder involving the immune mechanism, unspecified: Secondary | ICD-10-CM | POA: Diagnosis not present

## 2016-12-01 DIAGNOSIS — I129 Hypertensive chronic kidney disease with stage 1 through stage 4 chronic kidney disease, or unspecified chronic kidney disease: Secondary | ICD-10-CM | POA: Diagnosis not present

## 2016-12-01 DIAGNOSIS — Z940 Kidney transplant status: Secondary | ICD-10-CM | POA: Diagnosis not present

## 2016-12-03 NOTE — Unmapped (Signed)
Office visit note from [facility] dated 12/01/16 sent to HIM Willette Brace December 03, 2016 9:23 AM

## 2016-12-08 NOTE — Unmapped (Signed)
[facility] Refill and Clinical Coordination Note  Medication(s): TACROLIMUS 1MG  AND MYFORTIC 180MG     Benjamin Maldonado, DOB: 03-15-1976  Phone: [phone] (home) , Alternate phone contact: N/A  Shipping address: [address]  [address]  [place] [address]  Phone or address changes today?: No  All above HIPAA information verified.  Insurance changes? No    Completed refill and clinical call assessment today to schedule patient's medication shipment from the [facility] [phone]).      MEDICATION RECONCILIATION    Confirmed the medication and dosage are correct and have not changed: Yes, regimen is correct and unchanged.    Were there any changes to your medication(s) in the past month:  No, there are no changes reported at this time.    ADHERENCE    Is this medicine transplant or covered by Medicare Part B? Yes.    Tacrolimus 1 mg   Quantity filled last month: 360   # of tablets left on hand: 168  Myfortic 180 mg   Quantity filled last month: 180   # of tablets left on hand: 84    Did you miss any doses in the past 4 weeks? No missed doses reported.  Adherence counseling provided? Not needed     SIDE EFFECT MANAGEMENT    Are you tolerating your medication?:  Benjamin Maldonado reports tolerating the medication.  Side effect management discussed: None      Therapy is appropriate and should be continued.    Evidence of clinical benefit: See Epic note from 09/24/16      FINANCIAL/SHIPPING    Delivery Scheduled: Yes, Expected medication delivery date: 12/17/16   Additional medications refilled: OMEPRAZOLE AND METOPROLOL    Debbie did not have any additional questions at this time.    Delivery address validated in [facility] scheduling system: Yes, address listed above is correct.      We will follow up with patient monthly for standard refill processing and delivery.      Thank you,  Marletta Lor   [facility] Specialty Pharmacist

## 2016-12-10 ENCOUNTER — Encounter: Payer: Self-pay | Admitting: Podiatry

## 2016-12-10 ENCOUNTER — Ambulatory Visit (INDEPENDENT_AMBULATORY_CARE_PROVIDER_SITE_OTHER): Payer: Medicare Other | Admitting: Podiatry

## 2016-12-10 DIAGNOSIS — E1140 Type 2 diabetes mellitus with diabetic neuropathy, unspecified: Secondary | ICD-10-CM | POA: Diagnosis not present

## 2016-12-10 DIAGNOSIS — E1149 Type 2 diabetes mellitus with other diabetic neurological complication: Secondary | ICD-10-CM

## 2016-12-10 DIAGNOSIS — M2041 Other hammer toe(s) (acquired), right foot: Secondary | ICD-10-CM

## 2016-12-10 DIAGNOSIS — M2042 Other hammer toe(s) (acquired), left foot: Secondary | ICD-10-CM | POA: Diagnosis not present

## 2016-12-10 LAB — CBC W/ DIFFERENTIAL
BASOPHILS ABSOLUTE COUNT: 0 10*9/L
BASOPHILS ABSOLUTE COUNT: 0 10*9/L
BASOPHILS RELATIVE PERCENT: 0 %
BASOPHILS RELATIVE PERCENT: 0 %
EOSINOPHILS ABSOLUTE COUNT: 0 10*9/L
EOSINOPHILS ABSOLUTE COUNT: 0.1 10*9/L
EOSINOPHILS RELATIVE PERCENT: 1 %
EOSINOPHILS RELATIVE PERCENT: 1 %
HEMATOCRIT: 38.8 %
HEMATOCRIT: 38 %
LYMPHOCYTES ABSOLUTE COUNT: 0.7 10*9/L
LYMPHOCYTES ABSOLUTE COUNT: 0.7 10*9/L
LYMPHOCYTES RELATIVE PERCENT: 13 %
LYMPHOCYTES RELATIVE PERCENT: 13 %
MEAN CORPUSCULAR HEMOGLOBIN CONC: 33.8 g/dL
MEAN CORPUSCULAR HEMOGLOBIN CONC: 33.7 g/dL
MEAN CORPUSCULAR HEMOGLOBIN: 26.8 pg
MEAN CORPUSCULAR HEMOGLOBIN: 26.7 pg
MEAN CORPUSCULAR VOLUME: 80 fL
MEAN CORPUSCULAR VOLUME: 79 fL
MONOCYTES ABSOLUTE COUNT: 0.5 10*9/L
MONOCYTES ABSOLUTE COUNT: 0.5 10*9/L
MONOCYTES RELATIVE PERCENT: 9 %
MONOCYTES RELATIVE PERCENT: 9 %
NEUTROPHILS ABSOLUTE COUNT: 4 10*9/L
NEUTROPHILS ABSOLUTE COUNT: 4.2 10*9/L
NEUTROPHILS RELATIVE PERCENT: 77 %
NEUTROPHILS RELATIVE PERCENT: 77 %
PLATELET COUNT: 152 10*9/L
RED BLOOD CELL COUNT: 4.88 10*12/L
RED BLOOD CELL COUNT: 4.8 10*12/L
RED CELL DISTRIBUTION WIDTH: 14.2 %
RED CELL DISTRIBUTION WIDTH: 14.3 %
WBC ADJUSTED: 5.2 10*9/L
WBC ADJUSTED: 5.5 10*9/L

## 2016-12-10 LAB — URINALYSIS
BILIRUBIN UA: NEGATIVE
BILIRUBIN UA: NEGATIVE
BLOOD UA: NEGATIVE
BLOOD UA: NEGATIVE
KETONES UA: NEGATIVE
LEUKOCYTE ESTERASE UA: NEGATIVE
LEUKOCYTE ESTERASE UA: NEGATIVE
NITRITE UA: NEGATIVE
NITRITE UA: NEGATIVE
PH UA: 6
PH UA: 6
PROTEIN UA: NEGATIVE
SPECIFIC GRAVITY UA: 1.015
SPECIFIC GRAVITY UA: 1.025
UROBILINOGEN UA: 1

## 2016-12-10 LAB — BASIC METABOLIC PANEL
BLOOD UREA NITROGEN: 29 mg/dL — ABNORMAL HIGH
BLOOD UREA NITROGEN: 24 mg/dL
CALCIUM: 10.1 mg/dL
CALCIUM: 10.3 mg/dL — ABNORMAL HIGH
CHLORIDE: 105 mmol/L
CHLORIDE: 104 mmol/L
CO2: 25 mmol/L
CO2: 25 mmol/L
CREATININE: 1.45 mg/dL — ABNORMAL HIGH
CREATININE: 1.46 mg/dL — ABNORMAL HIGH
EGFR MDRD AF AMER: 69 mL/min/{1.73_m2}
GLUCOSE RANDOM: 246 mg/dL — ABNORMAL HIGH
GLUCOSE RANDOM: 251 mg/dL — ABNORMAL HIGH
POTASSIUM: 5.2 mmol/L
SODIUM: 139 mmol/L

## 2016-12-10 LAB — NON-HDL CHOLESTEROL: Lab: 0

## 2016-12-10 LAB — HEPATIC FUNCTION PANEL
ALKALINE PHOSPHATASE: 72 U/L
ALKALINE PHOSPHATASE: 76 U/L
ALT (SGPT): 21 U/L
ALT (SGPT): 21 U/L
AST (SGOT): 23 U/L
AST (SGOT): 16 U/L
PROTEIN TOTAL: 6.9 g/dL

## 2016-12-10 LAB — LIPID PANEL
CHOLESTEROL: 162 mg/dL
LDL CHOLESTEROL CALCULATED: 101 mg/dL — ABNORMAL HIGH
TRIGLYCERIDES: 79 mg/dL

## 2016-12-10 LAB — GLUCOSE UA

## 2016-12-10 LAB — MACROCYTES: Lab: 0

## 2016-12-10 LAB — TACROLIMUS, TROUGH
Lab: 6.3
Lab: 6.8

## 2016-12-10 LAB — BILIRUBIN TOTAL: Lab: 0.4

## 2016-12-10 LAB — ALKALINE PHOSPHATASE: Lab: 72

## 2016-12-10 LAB — ESTIMATED AVERAGE GLUCOSE: Lab: 0

## 2016-12-10 LAB — ALBUMIN
Lab: 4.6
Lab: 4.6

## 2016-12-10 LAB — CREATININE, URINE: Lab: 139.1

## 2016-12-10 LAB — PROTEIN URINE: Lab: 24.3

## 2016-12-10 LAB — CREATININE: Lab: 1.45 — ABNORMAL HIGH

## 2016-12-10 LAB — LYMPHOCYTES RELATIVE PERCENT: Lab: 13

## 2016-12-10 LAB — HEMOGLOBIN A1C: HEMOGLOBIN A1C: 7.8 % — ABNORMAL HIGH

## 2016-12-10 LAB — PROTEIN / CREATININE RATIO, URINE
PROTEIN/CREAT RATIO, URINE: 0.124
PROTEIN/CREAT RATIO, URINE: 0.115

## 2016-12-10 LAB — RBC UA: Lab: 0

## 2016-12-10 LAB — EGFR MDRD AF AMER: Lab: 69

## 2016-12-10 NOTE — Progress Notes (Signed)
Subjective:    Patient ID: Anthony Blanchard, male   DOB: 41 y.o.   MRN: [id number]   HPI patient presents stating my nails are doing okay but I continue to have problems with my feet and I'm a end-stage kidney patient and I've had long-term diabetes and I cannot feel my feet    ROS      Objective:  Physical Exam patient has significant neurological loss with area of irritation on the right big toe which is pre-ulcerative in nature with kidney disease and advanced type deformity     Assessment:   Significant at risk patient with chronic lesion hammertoe deformity and significant neurological deficit      Plan:    H&P condition reviewed and I do think he would do best in diabetic shoes with custom insoles to hopefully prevent further damage ulceration or Charcot foot disease. Patient will get approval for these to be made but I think it is very important in his case

## 2016-12-10 NOTE — Unmapped (Signed)
New standing [facility] orders entered/released

## 2016-12-16 MED FILL — OMEPRAZOLE/20MG/CPDR: OMEPRAZOLE/20MG/CPDR | 30 days supply | Qty: 30 | Fill #4

## 2016-12-16 MED FILL — TACROLIMUS/1MG/CAPS: TACROLIMUS/1MG/CAPS | 30 days supply | Qty: 360 | Fill #5

## 2016-12-16 MED FILL — MYFORTIC/180MG/TAB: MYFORTIC/180MG/TAB | 30 days supply | Qty: 180 | Fill #5

## 2016-12-16 MED FILL — METOPROLOL TARTRATE/50MG/TABS: METOPROLOL TARTRATE/50MG/TABS | 30 days supply | Qty: 90 | Fill #4

## 2016-12-25 MED FILL — BD NANO PEN NEEDLE/32G/4MM/NDL: BD NANO PEN NEEDLE/32G/4MM/NDL | 25 days supply | Qty: 1 | Fill #7

## 2016-12-28 MED FILL — LANTUS SOLOSTAR (BOX)/100UNIT/ML/SOLN: LANTUS SOLOSTAR (BOX)/100UNIT/ML/SOLN | 90 days supply | Qty: 1 | Fill #4

## 2016-12-29 MED ORDER — INSULIN ASPART (U-100) 100 UNIT/ML (3 ML) SUBCUTANEOUS PEN
3 refills | 0.00000 days | Status: CP
Start: 2016-12-29 — End: 2016-12-29

## 2016-12-29 MED ORDER — INSULIN ASPART (U-100) 100 UNIT/ML (3 ML) SUBCUTANEOUS PEN: mL | 3 refills | 0 days | Status: AC

## 2017-01-01 MED FILL — NOVOLOG FLEXPEN(BOX)/100UNIT/ML/INJ: NOVOLOG FLEXPEN(BOX)/100UNIT/ML/INJ | 41 days supply | Qty: 1 | Fill #0

## 2017-01-04 ENCOUNTER — Ambulatory Visit (INDEPENDENT_AMBULATORY_CARE_PROVIDER_SITE_OTHER): Payer: Medicare Other | Admitting: Orthotics

## 2017-01-04 DIAGNOSIS — M79605 Pain in left leg: Secondary | ICD-10-CM

## 2017-01-04 DIAGNOSIS — E1140 Type 2 diabetes mellitus with diabetic neuropathy, unspecified: Secondary | ICD-10-CM | POA: Diagnosis not present

## 2017-01-04 DIAGNOSIS — M2041 Other hammer toe(s) (acquired), right foot: Secondary | ICD-10-CM

## 2017-01-04 DIAGNOSIS — E1149 Type 2 diabetes mellitus with other diabetic neurological complication: Secondary | ICD-10-CM

## 2017-01-04 DIAGNOSIS — M79604 Pain in right leg: Secondary | ICD-10-CM

## 2017-01-04 DIAGNOSIS — B351 Tinea unguium: Secondary | ICD-10-CM

## 2017-01-04 DIAGNOSIS — M2042 Other hammer toe(s) (acquired), left foot: Secondary | ICD-10-CM

## 2017-01-06 NOTE — Unmapped (Signed)
[facility] Refill Coordination Note  Specialty Medication(s): TACROLIMUS 1MG  AND MYFORTIC 180MG   Additional Medications shipped: OMEPRAZOLE AND METOPROLOL    Benjamin Maldonado, DOB: [birth year]  Phone: [phone] (home) , Alternate phone contact: N/A  Phone or address changes today?: No  All above HIPAA information was verified with patient.  Shipping Address: [address]  [address]  [place] [address]   Insurance changes? No    Completed refill call assessment today to schedule patient's medication shipment from the [facility] [phone]).      Confirmed the medication and dosage are correct and have not changed: Yes, regimen is correct and unchanged.    Confirmed patient started or stopped the following medications in the past month:  No, there are no changes reported at this time.    Are you tolerating your medication?:  Benjamin Maldonado reports tolerating the medication.    ADHERENCE    Tacrolimus 5 mg   Quantity filled last month: 360   # of tablets left on hand: 168  Myfortic 180 mg   Quantity filled last month: 180   # of tablets left on hand: 84    Did you miss any doses in the past 4 weeks? No missed doses reported.    FINANCIAL/SHIPPING    Delivery Scheduled: Yes, Expected medication delivery date: 01/13/17     Clay did not have any additional questions at this time.    Delivery address validated in [facility]SI scheduling system: Yes, address listed in [facility]SI is correct.    We will follow up with patient monthly for standard refill processing and delivery.      Thank you,  Marletta Lor   [facility] Specialty Pharmacist

## 2017-01-11 MED FILL — OMEPRAZOLE/20MG/CPDR: OMEPRAZOLE/20MG/CPDR | 30 days supply | Qty: 30 | Fill #5

## 2017-01-12 DIAGNOSIS — Z23 Encounter for immunization: Secondary | ICD-10-CM | POA: Diagnosis not present

## 2017-01-12 DIAGNOSIS — I129 Hypertensive chronic kidney disease with stage 1 through stage 4 chronic kidney disease, or unspecified chronic kidney disease: Secondary | ICD-10-CM | POA: Diagnosis not present

## 2017-01-12 DIAGNOSIS — K219 Gastro-esophageal reflux disease without esophagitis: Secondary | ICD-10-CM | POA: Diagnosis not present

## 2017-01-12 DIAGNOSIS — E1129 Type 2 diabetes mellitus with other diabetic kidney complication: Secondary | ICD-10-CM | POA: Diagnosis not present

## 2017-01-12 DIAGNOSIS — R808 Other proteinuria: Secondary | ICD-10-CM | POA: Diagnosis not present

## 2017-01-12 DIAGNOSIS — E668 Other obesity: Secondary | ICD-10-CM | POA: Diagnosis not present

## 2017-01-12 DIAGNOSIS — Z940 Kidney transplant status: Secondary | ICD-10-CM | POA: Diagnosis not present

## 2017-01-12 DIAGNOSIS — B351 Tinea unguium: Secondary | ICD-10-CM | POA: Diagnosis not present

## 2017-01-12 DIAGNOSIS — H4089 Other specified glaucoma: Secondary | ICD-10-CM | POA: Diagnosis not present

## 2017-01-12 DIAGNOSIS — Z6832 Body mass index (BMI) 32.0-32.9, adult: Secondary | ICD-10-CM | POA: Diagnosis not present

## 2017-01-12 DIAGNOSIS — E11319 Type 2 diabetes mellitus with unspecified diabetic retinopathy without macular edema: Secondary | ICD-10-CM | POA: Diagnosis not present

## 2017-01-12 DIAGNOSIS — D8989 Other specified disorders involving the immune mechanism, not elsewhere classified: Secondary | ICD-10-CM | POA: Diagnosis not present

## 2017-01-12 MED FILL — MYFORTIC/180MG/TAB: MYFORTIC/180MG/TAB | 30 days supply | Qty: 180 | Fill #6

## 2017-01-12 MED FILL — TACROLIMUS/1MG/CAPS: TACROLIMUS/1MG/CAPS | 30 days supply | Qty: 360 | Fill #6

## 2017-01-12 MED FILL — METOPROLOL TARTRATE/50MG/TABS: METOPROLOL TARTRATE/50MG/TABS | 30 days supply | Qty: 90 | Fill #5

## 2017-01-13 NOTE — Progress Notes (Signed)

## 2017-01-28 DIAGNOSIS — H401122 Primary open-angle glaucoma, left eye, moderate stage: Secondary | ICD-10-CM | POA: Diagnosis not present

## 2017-02-01 DIAGNOSIS — Z6832 Body mass index (BMI) 32.0-32.9, adult: Secondary | ICD-10-CM | POA: Diagnosis not present

## 2017-02-01 DIAGNOSIS — Z940 Kidney transplant status: Secondary | ICD-10-CM | POA: Diagnosis not present

## 2017-02-01 DIAGNOSIS — E1129 Type 2 diabetes mellitus with other diabetic kidney complication: Secondary | ICD-10-CM | POA: Diagnosis not present

## 2017-02-01 DIAGNOSIS — I10 Essential (primary) hypertension: Secondary | ICD-10-CM | POA: Diagnosis not present

## 2017-02-02 NOTE — Unmapped (Signed)
[facility] Refill Coordination Note  Specialty Medication(s): Tacrolimus 1mg  & Myfortic 180mg   Additional Medications shipped: Metoprolol 50mg      Ori L Broadwell, DOB: 10/27/1975  Phone: [phone] (home) , Alternate phone contact: N/A  Phone or address changes today?: No  All above HIPAA information was verified with patient.  Shipping Address: [address]  [address]  [place]  [address]   Insurance changes? No    Completed refill call assessment today to schedule patient's medication shipment from the [facility] [phone]).      Confirmed the medication and dosage are correct and have not changed: Yes, regimen is correct and unchanged.    Confirmed patient started or stopped the following medications in the past month:  No, there are no changes reported at this time.    Are you tolerating your medication?:  Kaleem reports tolerating the medication.    ADHERENCE    (Below is required for Medicare Part B or Transplant patients only - per drug):   How many tablets were dispensed last month:   Tacrolimus 1 mg   Quantity filled last month: 360   # of tablets left on hand: Patient states about 12 days worth of medication   Myfortic 180 mg   Quantity filled last month: 180   # of tablets left on hand: Patient states about 72 tabs      Did you miss any doses in the past 4 weeks? No missed doses reported.    FINANCIAL/SHIPPING    Delivery Scheduled: Yes, Expected medication delivery date: 02/15/17     Gerad did not have any additional questions at this time.    Delivery address validated in [facility]SI scheduling system: Yes, address listed in FSI is correct.    We will follow up with patient monthly for standard refill processing and delivery.      Thank you,  Tamala Fothergill   [facility] Specialty Technician

## 2017-02-14 MED FILL — TACROLIMUS/1MG/CAPS: TACROLIMUS/1MG/CAPS | 30 days supply | Qty: 360 | Fill #7

## 2017-02-14 MED FILL — METOPROLOL TARTRATE/50MG/TABS: METOPROLOL TARTRATE/50MG/TABS | 30 days supply | Qty: 90 | Fill #6

## 2017-02-14 MED FILL — MYFORTIC/180MG/TAB: MYFORTIC/180MG/TAB | 30 days supply | Qty: 180 | Fill #7

## 2017-02-24 DIAGNOSIS — Z940 Kidney transplant status: Secondary | ICD-10-CM | POA: Diagnosis not present

## 2017-03-07 ENCOUNTER — Institutional Professional Consult (permissible substitution): Payer: Medicare Other | Admitting: Neurology

## 2017-03-07 NOTE — Unmapped (Signed)
[facility] Refill Coordination Note  Specialty Medication(s): Tacrolimus 1mg  & Myfortic 180mg   Additional Medications shipped: Novolin     Benjamin Maldonado, DOB: [birth year]  Phone: [phone] (home) , Alternate phone contact: N/A  Phone or address changes today?: No  All above HIPAA information was verified with patient.  Shipping Address: [address]  [address]  [place]  [address]   Insurance changes? No    Completed refill call assessment today to schedule patient's medication shipment from the [facility] [phone]).      Confirmed the medication and dosage are correct and have not changed: Yes, regimen is correct and unchanged.    Confirmed patient started or stopped the following medications in the past month:  No, there are no changes reported at this time.    Are you tolerating your medication?:  Benjamin Maldonado reports tolerating the medication.    ADHERENCE    (Below is required for Medicare Part B or Transplant patients only - per drug):   How many tablets were dispensed last month:   Tacrolimus 1 mg   Quantity filled last month: 360   # of tablets left on hand: 10 days    Myfortic 180 mg   Quantity filled last month: 180   # of tablets left on hand: 10 days    Did you miss any doses in the past 4 weeks? No missed doses reported.    FINANCIAL/SHIPPING    Delivery Scheduled: Yes, Expected medication delivery date: 03/15/2017     Benjamin Maldonado did not have any additional questions at this time.    Delivery address validated in FSI scheduling system: Yes, address listed in FSI is correct.    We will follow up with patient monthly for standard refill processing and delivery.      Thank you,  Tamala Fothergill   [facility] Shared [facility] Specialty Technician

## 2017-03-14 MED FILL — NOVOLOG FLEXPEN(BOX)/100UNIT/ML/INJ: NOVOLOG FLEXPEN(BOX)/100UNIT/ML/INJ | 41 days supply | Qty: 1 | Fill #1

## 2017-03-14 MED FILL — MYFORTIC/180MG/TAB: MYFORTIC/180MG/TAB | 30 days supply | Qty: 180 | Fill #8

## 2017-03-14 MED FILL — TACROLIMUS/1MG/CAPS: TACROLIMUS/1MG/CAPS | 30 days supply | Qty: 360 | Fill #8

## 2017-03-22 LAB — CBC W/ DIFFERENTIAL
BASOPHILS ABSOLUTE COUNT: 0 10*9/L
BASOPHILS RELATIVE PERCENT: 0 %
EOSINOPHILS ABSOLUTE COUNT: 0.1 10*9/L
EOSINOPHILS RELATIVE PERCENT: 1 %
HEMATOCRIT: 40 %
LYMPHOCYTES ABSOLUTE COUNT: 0.8 10*9/L
LYMPHOCYTES RELATIVE PERCENT: 14 %
MEAN CORPUSCULAR HEMOGLOBIN CONC: 34 g/dL
MEAN CORPUSCULAR HEMOGLOBIN: 26.7 pg
MEAN CORPUSCULAR VOLUME: 79 fL
MONOCYTES ABSOLUTE COUNT: 0.4 10*9/L
MONOCYTES RELATIVE PERCENT: 8 %
NEUTROPHILS ABSOLUTE COUNT: 4.4 10*9/L
NEUTROPHILS RELATIVE PERCENT: 77 %
PLATELET COUNT: 194 10*9/L
RED BLOOD CELL COUNT: 5.09 10*12/L
RED CELL DISTRIBUTION WIDTH: 14.9 %
WBC ADJUSTED: 5.7 10*9/L

## 2017-03-22 LAB — PROTEIN / CREATININE RATIO, URINE: CREATININE, URINE: 129.7 mg/dL

## 2017-03-22 LAB — POTASSIUM: Lab: 5.2

## 2017-03-22 LAB — ALBUMIN: Lab: 4.7

## 2017-03-22 LAB — CMV DNA, QUANTITATIVE, PCR

## 2017-03-22 LAB — HEPATIC FUNCTION PANEL
ALKALINE PHOSPHATASE: 75 U/L
AST (SGOT): 15 U/L
BILIRUBIN TOTAL: 0.4 mg/dL

## 2017-03-22 LAB — UROBILINOGEN UA: Lab: 0.2

## 2017-03-22 LAB — URINALYSIS
BILIRUBIN UA: NEGATIVE
BLOOD UA: NEGATIVE
GLUCOSE UA: NEGATIVE
KETONES UA: NEGATIVE
LEUKOCYTE ESTERASE UA: NEGATIVE
NITRITE UA: NEGATIVE
PH UA: 6
PROTEIN UA: NEGATIVE
SPECIFIC GRAVITY UA: 1.015
UROBILINOGEN UA: 0.2

## 2017-03-22 LAB — EBV QUANTITATIVE PCR, BLOOD

## 2017-03-22 LAB — BASIC METABOLIC PANEL
BLOOD UREA NITROGEN: 24 mg/dL
CALCIUM: 10.6 mg/dL — ABNORMAL HIGH
CHLORIDE: 104 mmol/L
EGFR MDRD AF AMER: 66 mL/min/{1.73_m2}
GLUCOSE RANDOM: 199 mg/dL — ABNORMAL HIGH
SODIUM: 139 mmol/L

## 2017-03-22 LAB — TACROLIMUS, TROUGH: Lab: 7

## 2017-03-22 LAB — CREATININE, URINE: Lab: 129.7

## 2017-03-22 LAB — CMV QUANT: Lab: 0

## 2017-03-22 LAB — BILIRUBIN DIRECT: Lab: 0

## 2017-03-22 LAB — BK BLOOD RESULT: Lab: NOT DETECTED

## 2017-03-22 LAB — NEUTROPHILS ABSOLUTE COUNT: Lab: 4.4

## 2017-03-22 LAB — EBV QUANT LOG: Lab: 0

## 2017-03-23 ENCOUNTER — Ambulatory Visit: Admission: RE | Admit: 2017-03-23 | Discharge: 2017-03-23 | Disposition: A | Discharge status: Home / Self Care

## 2017-03-23 DIAGNOSIS — Z79899 Other long term (current) drug therapy: Secondary | ICD-10-CM

## 2017-03-23 DIAGNOSIS — D899 Disorder involving the immune mechanism, unspecified: Secondary | ICD-10-CM

## 2017-03-23 DIAGNOSIS — Z940 Kidney transplant status: Principal | ICD-10-CM

## 2017-03-23 DIAGNOSIS — Z48298 Encounter for aftercare following other organ transplant: Secondary | ICD-10-CM

## 2017-03-23 DIAGNOSIS — Z7982 Long term (current) use of aspirin: Secondary | ICD-10-CM | POA: Diagnosis not present

## 2017-03-23 DIAGNOSIS — E785 Hyperlipidemia, unspecified: Secondary | ICD-10-CM | POA: Diagnosis not present

## 2017-03-23 DIAGNOSIS — Z992 Dependence on renal dialysis: Secondary | ICD-10-CM | POA: Diagnosis not present

## 2017-03-23 DIAGNOSIS — Z23 Encounter for immunization: Secondary | ICD-10-CM | POA: Diagnosis not present

## 2017-03-23 DIAGNOSIS — I120 Hypertensive chronic kidney disease with stage 5 chronic kidney disease or end stage renal disease: Secondary | ICD-10-CM | POA: Diagnosis not present

## 2017-03-23 DIAGNOSIS — N186 End stage renal disease: Secondary | ICD-10-CM | POA: Diagnosis not present

## 2017-03-23 DIAGNOSIS — E1121 Type 2 diabetes mellitus with diabetic nephropathy: Secondary | ICD-10-CM | POA: Diagnosis not present

## 2017-03-23 DIAGNOSIS — I2510 Atherosclerotic heart disease of native coronary artery without angina pectoris: Secondary | ICD-10-CM | POA: Diagnosis not present

## 2017-03-23 DIAGNOSIS — E11319 Type 2 diabetes mellitus with unspecified diabetic retinopathy without macular edema: Secondary | ICD-10-CM | POA: Diagnosis not present

## 2017-03-23 DIAGNOSIS — Z794 Long term (current) use of insulin: Secondary | ICD-10-CM | POA: Diagnosis not present

## 2017-03-23 DIAGNOSIS — E1142 Type 2 diabetes mellitus with diabetic polyneuropathy: Secondary | ICD-10-CM | POA: Diagnosis not present

## 2017-03-23 DIAGNOSIS — Z6832 Body mass index (BMI) 32.0-32.9, adult: Secondary | ICD-10-CM | POA: Diagnosis not present

## 2017-03-23 DIAGNOSIS — E1122 Type 2 diabetes mellitus with diabetic chronic kidney disease: Secondary | ICD-10-CM | POA: Diagnosis not present

## 2017-03-23 LAB — CBC W/ AUTO DIFF
BASOPHILS ABSOLUTE COUNT: 0 10*9/L (ref 0.0–0.1)
HEMATOCRIT: 44.4 % (ref 41.0–53.0)
HEMOGLOBIN: 13.5 g/dL (ref 13.5–17.5)
LARGE UNSTAINED CELLS: 4 % (ref 0–4)
LYMPHOCYTES ABSOLUTE COUNT: 0.8 10*9/L — ABNORMAL LOW (ref 1.5–5.0)
MEAN CORPUSCULAR HEMOGLOBIN CONC: 30.3 g/dL — ABNORMAL LOW (ref 31.0–37.0)
MEAN CORPUSCULAR HEMOGLOBIN: 27.1 pg (ref 26.0–34.0)
MEAN CORPUSCULAR VOLUME: 89.3 fL (ref 80.0–100.0)
MEAN PLATELET VOLUME: 9.7 fL (ref 7.0–10.0)
MONOCYTES ABSOLUTE COUNT: 0.4 10*9/L (ref 0.2–0.8)
NEUTROPHILS ABSOLUTE COUNT: 4.9 10*9/L (ref 2.0–7.5)
RED BLOOD CELL COUNT: 4.97 10*12/L (ref 4.50–5.90)
RED CELL DISTRIBUTION WIDTH: 14.5 % (ref 12.0–15.0)
WBC ADJUSTED: 6.4 10*9/L (ref 4.5–11.0)

## 2017-03-23 LAB — URINALYSIS
BACTERIA: NONE SEEN /HPF
BILIRUBIN UA: NEGATIVE
BLOOD UA: NEGATIVE
GLUCOSE UA: NEGATIVE
HYALINE CASTS: 1 /LPF (ref 0–1)
KETONES UA: NEGATIVE
LEUKOCYTE ESTERASE UA: NEGATIVE
NITRITE UA: NEGATIVE
PH UA: 5.5 (ref 5.0–9.0)
PROTEIN UA: NEGATIVE
RBC UA: <1 /HPF (ref <3)
SPECIFIC GRAVITY UA: 1.015 (ref 1.003–1.030)
SQUAMOUS EPITHELIAL: <1 /HPF (ref 0–5)
UROBILINOGEN UA: 0.2
WBC UA: <1 /HPF (ref <2)

## 2017-03-23 LAB — COMPREHENSIVE METABOLIC PANEL
ALBUMIN: 4.8 g/dL (ref 3.5–5.0)
ALKALINE PHOSPHATASE: 67 U/L (ref 38–126)
ALT (SGPT): 35 U/L (ref 19–72)
ANION GAP: 7 mmol/L — ABNORMAL LOW (ref 9–15)
AST (SGOT): 25 U/L (ref 19–55)
BILIRUBIN TOTAL: 0.6 mg/dL (ref 0.0–1.2)
BLOOD UREA NITROGEN: 27 mg/dL — ABNORMAL HIGH (ref 7–21)
BUN / CREAT RATIO: 17
CALCIUM: 10.7 mg/dL — ABNORMAL HIGH (ref 8.5–10.2)
CO2: 26 mmol/L (ref 22.0–30.0)
CREATININE: 1.61 mg/dL — ABNORMAL HIGH (ref 0.70–1.30)
EGFR MDRD AF AMER: 58 mL/min/{1.73_m2} — ABNORMAL LOW (ref >=60)
EGFR MDRD NON AF AMER: 48 mL/min/{1.73_m2} — ABNORMAL LOW (ref >=60)
GLUCOSE RANDOM: 130 mg/dL — ABNORMAL HIGH (ref 65–99)
POTASSIUM: 5.2 mmol/L — ABNORMAL HIGH (ref 3.5–5.0)
PROTEIN TOTAL: 7 g/dL (ref 6.5–8.3)
SODIUM: 140 mmol/L (ref 135–145)

## 2017-03-23 LAB — SLIDE REVIEW

## 2017-03-23 LAB — HEMOGLOBIN A1C
HEMOGLOBIN A1C: 7.5 % — ABNORMAL HIGH (ref 4.8–5.6)
Hemoglobin A1c/Hemoglobin.total:MFr:Pt:Bld:Qn:: 7.5 — ABNORMAL HIGH

## 2017-03-23 LAB — PHOSPHORUS: Phosphate:MCnc:Pt:Ser/Plas:Qn:: 3.7

## 2017-03-23 LAB — PROTEIN / CREATININE RATIO, URINE
CREATININE, URINE: 128.8 mg/dL
PROTEIN URINE: 5.9 mg/dL

## 2017-03-23 LAB — SMEAR REVIEW

## 2017-03-23 LAB — TACROLIMUS, TROUGH: Lab: 6.5

## 2017-03-23 LAB — BLOOD UREA NITROGEN: Urea nitrogen:MCnc:Pt:Ser/Plas:Qn:: 27 — ABNORMAL HIGH

## 2017-03-23 LAB — PROTEIN/CREAT RATIO, URINE: Protein/Creatinine:MRto:Pt:Urine:Qn:: 0.046

## 2017-03-23 LAB — LEUKOCYTE ESTERASE UA: Lab: NEGATIVE

## 2017-03-23 LAB — MAGNESIUM: Magnesium:MCnc:Pt:Ser/Plas:Qn:: 1.6

## 2017-03-23 LAB — BASOPHILS ABSOLUTE COUNT: Lab: 0

## 2017-03-23 MED ORDER — INSULIN GLARGINE (U-100) 100 UNIT/ML (3 ML) SUBCUTANEOUS PEN
PEN_INJECTOR | Freq: Every evening | SUBCUTANEOUS | 11 refills | 0.00000 days | Status: CP
Start: 2017-03-23 — End: 2017-03-23

## 2017-03-23 MED ORDER — INSULIN GLARGINE (U-100) 100 UNIT/ML (3 ML) SUBCUTANEOUS PEN: mL | 11 refills | 0 days | Status: AC

## 2017-03-23 MED ORDER — INSULIN GLARGINE (U-100) 100 UNIT/ML (3 ML) SUBCUTANEOUS PEN: 14 [IU] | pen | Freq: Every evening | 11 refills | 0 days | Status: AC

## 2017-03-23 MED FILL — OMEPRAZOLE/20MG/CPDR: OMEPRAZOLE/20MG/CPDR | 30 days supply | Qty: 30 | Fill #6

## 2017-03-23 MED FILL — METOPROLOL TARTRATE/50MG/TABS: METOPROLOL TARTRATE/50MG/TABS | 30 days supply | Qty: 90 | Fill #7

## 2017-03-23 NOTE — Unmapped (Signed)
Met with pt in nephrology clinic today.  He is feeling well and denies fever, cough, N/V/D, swelling or dysuria.  He held prograf prior to blood draw today, last dose was at 2130 last noc.  He has appt with Dr. Signe Colt in [place] later this month.

## 2017-03-23 NOTE — Unmapped (Signed)
Transplant Nephrology Clinic Visit    History of Present Illness    Benjamin Maldonado is a 41 y.o. 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  Baseline creatinine recently has been 1.3-1.8 mg/dL.      He presents today without complaints. He notes only occasional mild tenderness over his kidney transplant. 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 He denies fevers, coughs, nausea, vomiting, diarrhea, edema and dysuria.     Kidney Transplant History  ??  1. 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 Deceased donor kidney transplant 11/08/2014.  DCD kidney donor with KDPI 43%, CMV D-R-, EBV D+R+  3. Donor with asphyxiation and serratia isolated on donor tracheal aspirate and donor blood cultures with S. Epidermidis bacteremia. Patient treated with Vancomycin for 2 weeks.  4. Delayed graft function with dialysis due to hyperkalemia  5. Induction agent = campath, solumedrol with rapid steroid discontinuation  6. Maintenance immunosuppression = tacrolimus, mycophenolate sodium  7. Stent removed 12/11/2014.  8. Kidney biopsy 11/12/2015 due to acute allograft dysfunction with focal mild ATN and no other pathology.  9. 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      ??  Other Medical History   ??  1. Type 2 diabetes mellitus, onset 1991.   2. Hypertension of several years duration.  3. Coronary artery disease, s/p PCI to the RCA in 2009, s/p DES to LAD 04/30/2011,   4. Left ventricular hypertrophy with diastolic dysfunction noted on echocardiogram 09/2014.  5. Hyperlipidemia  6. Diabetic retinopathy with right eye blindness  7. Diabetic peripheral neuropathy  8. Frozen left shoulder  ??  Review of Systems    Otherwise as per HPI, all other systems reviewed and are negative.    Medications  Current Outpatient Prescriptions   Medication Sig Dispense Refill   ??? acetaminophen (TYLENOL) 325 MG tablet Take 2 tablets (650 mg total) by mouth every six (6) hours as needed for pain. 100 tablet 2   ??? aspirin (ADULT LOW DOSE ASPIRIN) 81 MG tablet Take 1 tablet (81 mg total) by mouth daily. 30 tablet 11   ??? atorvastatin (LIPITOR) 20 MG tablet Take 1 tablet (20 mg total) by mouth daily. 90 tablet 3   ??? blood sugar diagnostic Strp by Other route Four (4) times a day (before meals and nightly). One Touch 100 strip 5   ??? blood-glucose meter (GLUCOSE MONITORING KIT) kit Use as instructed 1 each 0   ??? GLUC.METER,DIS.P-LOADED STRIPS (GLUCOSE METER, DISP & STRIPS MISC) Frequency:PHARMDIR   Dosage:0.0     Instructions:  Note:250.00, life long Dose: 1     ??? insulin ASPART (NOVOLOG FLEXPEN) 100 unit/mL injection pen Take 4 units with meals plus sliding scale up to 12 units with each meal based on blood sugars 15 mL 3   ??? insulin glargine (LANTUS) 100 unit/mL (3 mL) injection pen Inject 0.14 mL (14 Units total) under the skin nightly. 15 mL 12   ??? metoprolol tartrate (LOPRESSOR) 50 MG tablet Take 1.5 tablets (75 mg total) by mouth Two (2) times a day. 90 tablet 11   ??? mycophenolate (MYFORTIC) 180 MG EC tablet Take 3 tablets (540 mg total) by mouth Two (2) times a day. 180 tablet 11   ??? omeprazole (PRILOSEC) 20 MG capsule Take 1 capsule (20 mg total)  by mouth daily. 30 capsule 11   ??? pen needle, diabetic 32 gauge x 5/32 Ndle Use to administer insulin 4 times per day ACHS. E 13.9 120 each 11   ??? tacrolimus (PROGRAF) 1 MG capsule Take 6 capsules (6 mg total) by mouth Two (2) times a day. 360 capsule 11     No current facility-administered medications for this visit.        Physical Exam  BP 112/68 (BP Site: L Arm, BP Position: Sitting, BP Cuff Size: Medium)  - Pulse 73  - Temp 36.8 ??C (98.2 ??F) (Temporal)  - Ht 175.3 cm (5' 9.02)  - Wt 100.6 kg (221 lb 12.8 oz)  - BMI 32.74 kg/m??   General: no acute distress  HEENT: Anicteric. He is blind in the right eye with corneal opacification.  Neck: neck supple, no cervical lymphadenopathy appreciated  CV: RRR, no murmur, no gallops, no rubs appreciated  Lungs: clear to auscultation bilaterally  Abdomen: soft, non tender, graft site non-tender  Extremities: no edema, RUE AVF without bruit or thrill  Musculoskeletal: no visible deformity. No limitation in ROM of left shoulder.  Pulses: intact distally throughout  Neurologic: awake, alert, and oriented x3    Laboratory Data  Recent Results (from the past 170 hour(s))   Comprehensive Metabolic Panel    Collection Time: 03/23/17 10:27 AM   Result Value Ref Range    Sodium 140 135 - 145 mmol/L    Potassium 5.2 (H) 3.5 - 5.0 mmol/L    Chloride 107 98 - 107 mmol/L    CO2 26.0 22.0 - 30.0 mmol/L    BUN 27 (H) 7 - 21 mg/dL    Creatinine 1.61 (H) 0.70 - 1.30 mg/dL    BUN/Creatinine Ratio 17     EGFR MDRD Non Af Amer 48 (L) >=60 mL/min/1.9m2    EGFR MDRD Af Amer 58 (L) >=60 mL/min/1.54m2    Anion Gap 7 (L) 9 - 15 mmol/L    Glucose 130 (H) 65 - 99 mg/dL    Calcium 09.6 (H) 8.5 - 10.2 mg/dL    Albumin 4.8 3.5 - 5.0 g/dL    Total Protein 7.0 6.5 - 8.3 g/dL    Total Bilirubin 0.6 0.0 - 1.2 mg/dL    AST 25 19 - 55 U/L    ALT 35 19 - 72 U/L    Alkaline Phosphatase 67 38 - 126 U/L   Magnesium Level    Collection Time: 03/23/17 10:27 AM   Result Value Ref Range    Magnesium 1.6 1.6 - 2.2 mg/dL   Phosphorus Level    Collection Time: 03/23/17 10:27 AM   Result Value Ref Range    Phosphorus 3.7 2.9 - 4.7 mg/dL   Tacrolimus Level, Trough    Collection Time: 03/23/17 10:27 AM   Result Value Ref Range    Tacrolimus, Trough 6.5 <=20.0 ng/mL   Hemoglobin A1c    Collection Time: 03/23/17 10:27 AM   Result Value Ref Range    Hemoglobin A1C 7.5 (H) 4.8 - 5.6 %    Estimated Average Glucose 169 mg/dL   CBC w/ Differential    Collection Time: 03/23/17 10:27 AM   Result Value Ref Range    WBC 6.4 4.5 - 11.0 10*9/L    RBC 4.97 4.50 - 5.90 10*12/L    HGB 13.5 13.5 - 17.5 g/dL    HCT 04.5 40.9 - 81.1 %    MCV 89.3 80.0 - 100.0 fL    MCH  27.1 26.0 - 34.0 pg    MCHC 30.3 (L) 31.0 - 37.0 g/dL    RDW 16.1 09.6 - 04.5 %    MPV 9.7 7.0 - 10.0 fL    Platelet 150 150 - 440 10*9/L    Absolute Neutrophils 4.9 2.0 - 7.5 10*9/L    Absolute Lymphocytes 0.8 (L) 1.5 - 5.0 10*9/L    Absolute Monocytes 0.4 0.2 - 0.8 10*9/L    Absolute Eosinophils 0.1 0.0 - 0.4 10*9/L    Absolute Basophils 0.0 0.0 - 0.1 10*9/L    Large Unstained Cells 4 0 - 4 %    Hypochromasia Marked (A) Not Present   Morphology Review    Collection Time: 03/23/17 10:27 AM   Result Value Ref Range    Smear Review Comments See Comment (A) Undefined    Burr Cells Present (A) Not Present   Urinalysis    Collection Time: 03/23/17 12:43 PM   Result Value Ref Range    Color, UA Yellow     Clarity, UA Clear     Specific Gravity, UA 1.015 1.003 - 1.030    pH, UA 5.5 5.0 - 9.0    Leukocyte Esterase, UA Negative Negative    Nitrite, UA Negative Negative    Protein, UA Negative Negative    Glucose, UA Negative Negative    Ketones, UA Negative Negative    Urobilinogen, UA 0.2 mg/dL 0.2 mg/dL, 1.0 mg/dL    Bilirubin, UA Negative Negative    Blood, UA Negative Negative    RBC, UA <1 <3 /HPF    WBC, UA <1 <2 /HPF    Squam Epithel, UA <1 0 - 5 /HPF    Bacteria, UA None Seen None Seen /HPF    Hyaline Casts, UA 1 0 - 1 /LPF   Protein/Creatinine Ratio, Urine    Collection Time: 03/23/17 12:43 PM   Result Value Ref Range    Creat U 128.8 Undefined mg/dL    Protein, Ur 5.9 Undefined mg/dL    Protein/Creatinine Ratio, Urine 0.046 Undefined       Assessment    Benjamin Maldonado is a 41 y.o. 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 Active medical issues include:    1. Status post kidney transplant. Serum creatinine is 1.61 mg/dL and within the recent baseline range of 1.2-1.8 mg/dL. He has no history of rejection or proteinuria or donor specific antibodies.    2. Immunosuppression management. We are targeting tacrolimus levels of approximately 5-8. Today's level of 6.5 is within this target range. His current dose of tacrolimus will be continued as will Myfortic 540 mg twice daily.      3. 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 I do not feel further imaging is necessary in the absence of HTN or allograft dysfunction.    4. Diabetes mellitus.  He sees Dr. Wylene Simmer for management of diabetes. A1C today is 7.5.     5. Hyperlipidemia. He is on Lipitor without myopathy symptoms with LDL today of 101 and total cholesterol of 162. .    6. Coronary artery disease. He will continue beta blocker therapy and aspirin. He has no recent symptoms suggestive of cardiac ischemia.     7. Immunizations. He will receive Prevnar 13 today. He recieved flu vaccine in 12/2016.    8. Follow-up. He will be seen  again in 6 months. We continue shared follow-up with Dr. Signe Colt in [place] and he will see her in 3 months.      Scribe's Attestation: Jackey Loge, MD obtained and performed the history, physical exam and medical decision making elements that were entered into the chart.  Signed by Delaney Meigs, Scribe, on March 23, 2017 11:19 AM    ----------------------------------------------------------------------------------------------------------------------  March 24, 2017 10:20 AM. Documentation assistance provided by the Scribe. I was present during the time the encounter was recorded. The information recorded by the Scribe was done at my direction and has been reviewed and validated by me.  ----------------------------------------------------------------------------------------------------------------------

## 2017-03-23 NOTE — Unmapped (Signed)
Patient request for RX refill.

## 2017-03-24 LAB — CMV DNA, QUANTITATIVE, PCR: CMV VIRAL LD: NOT DETECTED

## 2017-03-24 LAB — CMV VIRAL LD: Lab: NOT DETECTED

## 2017-03-25 DIAGNOSIS — H401122 Primary open-angle glaucoma, left eye, moderate stage: Secondary | ICD-10-CM | POA: Diagnosis not present

## 2017-03-25 MED ORDER — PEN NEEDLE, DIABETIC 32 GAUGE X 5/32" (4 MM): each | 99 refills | 0 days | Status: AC

## 2017-03-25 MED ORDER — BD ULTRA-FINE NANO PEN NEEDLE 32 GAUGE X 5/32" (4 MM)
PRN refills | 0 days | Status: CP
Start: 2017-03-25 — End: 2017-03-25

## 2017-03-25 MED ORDER — PEN NEEDLE, DIABETIC 32 GAUGE X 5/32" (4 MM)
99 refills | 0.00000 days | Status: CP
Start: 2017-03-25 — End: 2017-03-25

## 2017-03-25 MED FILL — LANTUS SOLOSTAR (BOX)/100UNIT/ML/SOLN: LANTUS SOLOSTAR (BOX)/100UNIT/ML/SOLN | 30 days supply | Qty: 1 | Fill #0

## 2017-03-25 NOTE — Unmapped (Signed)
Addended by: Jackey Loge on: 03/24/2017 05:12 PM     Modules accepted: Orders

## 2017-03-25 NOTE — Unmapped (Signed)
Patient request for RX refill.

## 2017-03-30 MED FILL — BD NANO PEN NEEDLE/32G/4MM/NDL: BD NANO PEN NEEDLE/32G/4MM/NDL | 25 days supply | Qty: 1 | Fill #0

## 2017-04-04 ENCOUNTER — Ambulatory Visit (INDEPENDENT_AMBULATORY_CARE_PROVIDER_SITE_OTHER): Payer: Medicare Other | Admitting: Neurology

## 2017-04-04 ENCOUNTER — Encounter: Payer: Self-pay | Admitting: Neurology

## 2017-04-04 VITALS — BP 155/80 | HR 63 | Ht 69.0 in | Wt 222.0 lb

## 2017-04-04 DIAGNOSIS — E669 Obesity, unspecified: Secondary | ICD-10-CM

## 2017-04-04 DIAGNOSIS — R0683 Snoring: Secondary | ICD-10-CM | POA: Diagnosis not present

## 2017-04-04 DIAGNOSIS — R351 Nocturia: Secondary | ICD-10-CM

## 2017-04-04 DIAGNOSIS — G2581 Restless legs syndrome: Secondary | ICD-10-CM

## 2017-04-04 DIAGNOSIS — R0681 Apnea, not elsewhere classified: Secondary | ICD-10-CM

## 2017-04-04 NOTE — Patient Instructions (Addendum)

## 2017-04-04 NOTE — Progress Notes (Signed)
Subjective:    Patient ID: Anthony Blanchard is a 41 y.o. male.  HPI     Star Age, MD, PhD [facility] [address], [address] [place], [place] [address]  Dear Dr. Osborne Casco,   I saw your patient, Anthony Blanchard, upon your kind request, in my neurologic clinic today for initial consultation of his sleep disorder, in particular, concern for underlying obstructive sleep apnea. The patient is unaccompanied today. As you know, Anthony Blanchard is a 41 year old right-handed gentleman with an underlying medical history of poorly controlled diabetes with diabetic neuropathy, kidney failure with status post kidney transplant, hypertension, reflux disease, right eye blindness, heart disease and obesity, who reports snoring and witnessed apneic breathing pauses while asleep. This was also noted by his ex-wife in the past. He has 2 sons, age 53 and 21. I reviewed your office note from 02/01/2017, which you kindly included. His Epworth sleepiness score is 0 out of 24, fatigue score is 10 out of 63. He lives with his girlfriend and her children. He is divorced. He does not work. He is a nonsmoker and does not drink alcohol, does not utilize caffeine on a daily basis. He was on hemodialysis for about 10 years until he received his kidney transplant in 2016. His bedtime is between 10 and 11 PM typically, he typically is asleep by 11:30 or midnight. Wakeup time is around 6:30 when his girlfriend gets up. He typically takes a nap for about an hour in the late morning around 10 or 11 AM. He does endorse some restless leg symptoms. He has nocturia about 2-3 times per average night, denies morning headache or a family history of OSA.  His Past Medical History Is Significant For: Past Medical History:  Diagnosis Date  . Chronic kidney disease   . Diabetes mellitus without complication ([place])   . GERD (gastroesophageal reflux disease)   . Glaucoma   . Hypertension   . Overweight   . Partial  blindness     His Past Surgical History Is Significant For: Past Surgical History:  Procedure Laterality Date  . 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    His Family History Is Significant For: No family history on file.  His Social History Is Significant For: Social History   Socioeconomic History  . Marital status: Single    Spouse name: None  . Number of children: None  . Years of education: None  . Highest education level: None  Social Needs  . Financial resource strain: None  . Food insecurity - worry: None  . Food insecurity - inability: None  . Transportation needs - medical: None  . Transportation needs - non-medical: None  Occupational History  . None  Tobacco Use  . Smoking status: Never Smoker  . Smokeless tobacco: Never Used  Substance and Sexual Activity  . Alcohol use: None  . Drug use: None  . Sexual activity: None  Other Topics Concern  . None  Social History Narrative  . None    His Allergies Are:  Allergies  Allergen Reactions  . Shellfish-Derived Products Anaphylaxis  . Iodine Other (See Comments)  :   His Current Medications Are:  Outpatient Encounter Medications as of 04/04/2017  Medication Sig  . aspirin EC 81 MG tablet Take 81 mg by mouth.  . Blood Glucose Monitoring Suppl (FIFTY50 GLUCOSE METER 2.0) w/Device KIT Use as instructed  . Cholecalciferol (VITAMIN D) 2000 units tablet Take by mouth.  . insulin glargine (LANTUS) 100 UNIT/ML  injection Inject 13 Units into the skin at bedtime.  . insulin lispro (HUMALOG) 100 UNIT/ML KiwkPen Give 4 units before breakfast, 8 units before lunch and 6 units at dinner . add 2 units if >200  . magnesium oxide (MAG-OX) 400 MG tablet Take 400 mg by mouth daily.  . metoprolol tartrate (LOPRESSOR) 50 MG tablet Take 75 mg by mouth 2 (two) times daily.   . mycophenolate (MYFORTIC) 180 MG EC tablet Take 540 mg by mouth.  Marland Kitchen omeprazole (PRILOSEC) 20 MG capsule Take 20 mg by mouth.  . tacrolimus (PROGRAF) 1 MG capsule  Take 6 mg by mouth.  Marland Kitchen atorvastatin (LIPITOR) 20 MG tablet Take 20 mg by mouth.   No facility-administered encounter medications on file as of 04/04/2017.   :  Review of Systems:  Out of a complete 14 point review of systems, all are reviewed and negative with the exception of these symptoms as listed below: Review of Systems  Neurological:       Pt presents today to discuss his sleep. Pt had a sleep study in 2014 but has never been on a cpap machine. Pt does endorse snoring.  Epworth Sleepiness Scale 0= would never doze 1= slight chance of dozing 2= moderate chance of dozing 3= high chance of dozing  Sitting and reading: 0 Watching TV: 0 Sitting inactive in a public place (ex. Theater or meeting): 0 As a passenger in a car for an hour without a break: 0 Lying down to rest in the afternoon: 0 Sitting and talking to someone: 0 Sitting quietly after lunch (no alcohol): 0 In a car, while stopped in traffic: 0 Total: 0     Objective:  Neurological Exam  Physical Exam Physical Examination:   Vitals:   04/04/17 1529  BP: (!) 155/80  Pulse: 63   General Examination: The patient is a very pleasant 41 y.o. male in no acute distress. He appears well-developed and well-nourished and well groomed.   HEENT: Normocephalic, atraumatic, left pupil is reactive to light, right eye almost closed. Extraocular tracking is good with the left eye. Hearing is grossly intact. Face is symmetric otherwise. Speech is clear, no tremor noted. Neck is supple. Oropharynx exam reveals: mild mouth dryness, adequate dental hygiene and moderate airway crowding, due to larger uvula and thicker tongue and tonsils in place, 1+. Mallampati is class II. Tongue protrudes centrally and palate elevates symmetrically. Neck size is 18 3/8 inches. He has a Mild overbite. Nasal inspection reveals no significant nasal mucosal bogginess or redness and no septal deviation.   Chest: Clear to auscultation without wheezing,  rhonchi or crackles noted.  Heart: S1+S2+0, regular and normal without murmurs, rubs or gallops noted.   Abdomen: Soft, non-tender and non-distended with normal bowel sounds appreciated on auscultation.  Extremities: There is no pitting edema in the distal lower extremities bilaterally. Pedal pulses are intact.  Skin: Warm and dry without trophic changes noted.  Musculoskeletal: exam reveals no obvious joint deformities, tenderness or joint swelling or erythema.   Neurologically:  Mental status: The patient is awake, alert and oriented in all 4 spheres. His immediate and remote memory, attention, language skills and fund of knowledge are appropriate. There is no evidence of aphasia, agnosia, apraxia or anomia. Speech is clear with normal prosody and enunciation. Thought process is linear. Mood is normal and affect is normal.  Cranial nerves II - XII are as described above under HEENT exam. In addition: shoulder shrug is normal with equal shoulder height noted.  Motor exam: Normal bulk, strength and tone is noted. There is no drift, tremor or rebound. Reflexes are 1+ in the upper extremities and absent in the lower extremities. Fine motor skills and coordination: grossly intact.  Cerebellar testing: No dysmetria or intention tremor.  Sensory exam: intact to light touch in the upper and lower extremities.  Gait, station and balance: He stands easily. No veering to one side is noted. No leaning to one side is noted. Posture is age-appropriate and stance is narrow based. Gait shows normal stride length and normal pace. No problems turning are noted.                Assessment and Plan:   In summary, SUFYAN MEIDINGER is a very pleasant 41 y.o.-year old male with an underlying medical history of poorly controlled diabetes with diabetic neuropathy, kidney failure with status post kidney transplant, hypertension, reflux disease, right eye blindness, heart disease and obesity, whose history and physical  exam are concerning for obstructive sleep apnea (OSA).  I had a long chat with the patient about my findings and the diagnosis of OSA, its prognosis and treatment options. We talked about medical treatments, surgical interventions and non-pharmacological approaches. I explained in particular the risks and ramifications of untreated moderate to severe OSA, especially with respect to developing cardiovascular disease down the Road, including congestive heart failure, difficult to treat hypertension, cardiac arrhythmias, or stroke. Even type 2 diabetes has, in part, been linked to untreated OSA. Symptoms of untreated OSA include daytime sleepiness, memory problems, mood irritability and mood disorder such as depression and anxiety, lack of energy, as well as recurrent headaches, especially morning headaches. We talked about trying to maintain a healthy lifestyle in general, as well as the importance of weight control. I encouraged the patient to eat healthy, exercise daily and keep well hydrated, to keep a scheduled bedtime and wake time routine, to not skip any meals and eat healthy snacks in between meals. I advised the patient not to drive when feeling sleepy. I recommended the following at this time: sleep study with potential positive airway pressure titration. (We will score hypopneas at 4%).   I explained the sleep test procedure to the patient and also outlined possible surgical and non-surgical treatment options of OSA, including the use of a custom-made dental device (which would require a referral to a specialist dentist or oral surgeon), upper airway surgical options, such as pillar implants, radiofrequency surgery, tongue base surgery, and UPPP (which would involve a referral to an ENT surgeon). Rarely, jaw surgery such as mandibular advancement may be considered.  I also explained the CPAP treatment option to the patient, who indicated that he would be willing to try CPAP if the need arises. I  explained the importance of being compliant with PAP treatment, not only for insurance purposes but primarily to improve His symptoms, and for the patient's long term health benefit, including to reduce His cardiovascular risks. I answered all his questions today and the patient was in agreement. I would like to see him back after the sleep study is completed and encouraged him to call with any interim questions, concerns, problems or updates.   Thank you very much for allowing me to participate in the care of this nice patient. If I can be of any further assistance to you please do not hesitate to call me at [phone].  Sincerely,   Star Age, MD, PhD

## 2017-04-05 NOTE — Unmapped (Signed)
[facility] Refill Coordination Note  Specialty Medication(s): Myfortic 180mg  & Tacrolimus 1mg     JUANITA DEVINCENT, DOB: 06/06/1975  Phone: [phone] (home) , Alternate phone contact: N/A  Phone or address changes today?: No  All above HIPAA information was verified with patient.  Shipping Address: [address]  [address]  [place] [address]   Insurance changes? No    Completed refill call assessment today to schedule patient's medication shipment from the [facility] [phone]).      Confirmed the medication and dosage are correct and have not changed: Yes, regimen is correct and unchanged.    Confirmed patient started or stopped the following medications in the past month:  No, there are no changes reported at this time.    Are you tolerating your medication?:  Denise reports tolerating the medication.    ADHERENCE    (Below is required for Medicare Part B or Transplant patients only - per drug):   How many tablets were dispensed last month:     Tacrolimus 1 mg   Quantity filled last month: 360   # of tablets left on hand: 10 days    Myfortic 180 mg   Quantity filled last month: 180   # of tablets left on hand: 10 days    Did you miss any doses in the past 4 weeks? No missed doses reported.    FINANCIAL/SHIPPING    Delivery Scheduled: Yes, Expected medication delivery date: 04/08/2017     Jeter did not have any additional questions at this time.    Delivery address validated in [facility]SI scheduling system: Yes, address listed in FSI is correct.    We will follow up with patient monthly for standard refill processing and delivery.      Thank you,  Tamala Fothergill   [facility] Specialty Technician

## 2017-04-05 NOTE — Unmapped (Signed)
I

## 2017-04-07 MED FILL — MYFORTIC/180MG/TAB: MYFORTIC/180MG/TAB | 30 days supply | Qty: 180 | Fill #9

## 2017-04-07 MED FILL — TACROLIMUS/1MG/CAPS: TACROLIMUS/1MG/CAPS | 30 days supply | Qty: 360 | Fill #9

## 2017-04-20 MED FILL — METOPROLOL TARTRATE/50MG/TABS: METOPROLOL TARTRATE/50MG/TABS | 30 days supply | Qty: 90 | Fill #8

## 2017-04-20 MED FILL — OMEPRAZOLE/20MG/CPDR: OMEPRAZOLE/20MG/CPDR | 30 days supply | Qty: 30 | Fill #7

## 2017-04-21 DIAGNOSIS — E11319 Type 2 diabetes mellitus with unspecified diabetic retinopathy without macular edema: Secondary | ICD-10-CM | POA: Diagnosis not present

## 2017-04-21 DIAGNOSIS — Z6832 Body mass index (BMI) 32.0-32.9, adult: Secondary | ICD-10-CM | POA: Diagnosis not present

## 2017-04-21 DIAGNOSIS — I129 Hypertensive chronic kidney disease with stage 1 through stage 4 chronic kidney disease, or unspecified chronic kidney disease: Secondary | ICD-10-CM | POA: Diagnosis not present

## 2017-04-21 DIAGNOSIS — E668 Other obesity: Secondary | ICD-10-CM | POA: Diagnosis not present

## 2017-04-21 DIAGNOSIS — R808 Other proteinuria: Secondary | ICD-10-CM | POA: Diagnosis not present

## 2017-04-21 DIAGNOSIS — Z940 Kidney transplant status: Secondary | ICD-10-CM | POA: Diagnosis not present

## 2017-04-21 DIAGNOSIS — D8989 Other specified disorders involving the immune mechanism, not elsewhere classified: Secondary | ICD-10-CM | POA: Diagnosis not present

## 2017-04-21 DIAGNOSIS — K219 Gastro-esophageal reflux disease without esophagitis: Secondary | ICD-10-CM | POA: Diagnosis not present

## 2017-04-21 DIAGNOSIS — H4089 Other specified glaucoma: Secondary | ICD-10-CM | POA: Diagnosis not present

## 2017-04-21 DIAGNOSIS — I10 Essential (primary) hypertension: Secondary | ICD-10-CM | POA: Diagnosis not present

## 2017-04-21 DIAGNOSIS — E1129 Type 2 diabetes mellitus with other diabetic kidney complication: Secondary | ICD-10-CM | POA: Diagnosis not present

## 2017-04-27 NOTE — Unmapped (Signed)
[facility] Refill and Clinical Coordination Note  Medication(s): TACROLIMUS, MYFORTIC    Benjamin Maldonado, DOB: [birth year]  Phone: [phone] (home) , Alternate phone contact: N/A  Shipping address: [address]  [address]  [place] [address]  Phone or address changes today?: No  All above HIPAA information verified.  Insurance changes? No    Completed refill and clinical call assessment today to schedule patient's medication shipment from the [facility] [phone]).      MEDICATION RECONCILIATION    Confirmed the medication and dosage are correct and have not changed: Yes, regimen is correct and unchanged.    Were there any changes to your medication(s) in the past month:  No, there are no changes reported at this time.    ADHERENCE    Is this medicine transplant or covered by Medicare Part B? Yes.    Tacrolimus 1 mg   Quantity filled last month: 360   # of tablets left on hand: 144  Myfortic 180 mg   Quantity filled last month: 180   # of tablets left on hand: 72      Did you miss any doses in the past 4 weeks? No missed doses reported.  Adherence counseling provided? Not needed     SIDE EFFECT MANAGEMENT    Are you tolerating your medication?:  Benjamin Maldonado reports tolerating the medication.  Side effect management discussed: None      Therapy is appropriate and should be continued.    Evidence of clinical benefit: See Epic note from 09/24/16      FINANCIAL/SHIPPING    Delivery Scheduled: Yes, Expected medication delivery date: 05/06/17. However patient aware must make $10 payment to get account off hold - he says will call in tomorrow 1/10 and pay it.   Additional medications refilled: lantus, novolog, pen needles    Benjamin Maldonado did not have any additional questions at this time.    Delivery address validated in [facility]SI scheduling system: Yes, address listed above is correct.      We will follow up with patient monthly for standard refill processing and delivery.      Thank you,  [name]   [facility] Specialty Pharmacist

## 2017-04-29 NOTE — Unmapped (Signed)
New standing [facility] orders entered/released.  Including BK PCR

## 2017-05-05 MED FILL — NOVOLOG FLEXPEN(BOX)/100UNIT/ML/INJ: NOVOLOG FLEXPEN(BOX)/100UNIT/ML/INJ | 41 days supply | Qty: 1 | Fill #2

## 2017-05-05 MED FILL — BD NANO PEN NEEDLE/32G/4MM/NDL: BD NANO PEN NEEDLE/32G/4MM/NDL | 25 days supply | Qty: 1 | Fill #1

## 2017-05-05 MED FILL — TACROLIMUS/1MG/CAPS: TACROLIMUS/1MG/CAPS | 30 days supply | Qty: 360 | Fill #10

## 2017-05-05 MED FILL — MYFORTIC/180MG/TAB: MYFORTIC/180MG/TAB | 30 days supply | Qty: 180 | Fill #10

## 2017-05-05 MED FILL — LANTUS SOLOSTAR (BOX)/100UNIT/ML/SOLN: LANTUS SOLOSTAR (BOX)/100UNIT/ML/SOLN | 30 days supply | Qty: 1 | Fill #1

## 2017-05-09 DIAGNOSIS — E113532 Type 2 diabetes mellitus with proliferative diabetic retinopathy with traction retinal detachment not involving the macula, left eye: Secondary | ICD-10-CM | POA: Diagnosis not present

## 2017-05-09 DIAGNOSIS — Z79899 Other long term (current) drug therapy: Secondary | ICD-10-CM | POA: Diagnosis not present

## 2017-05-09 DIAGNOSIS — H3582 Retinal ischemia: Secondary | ICD-10-CM | POA: Diagnosis not present

## 2017-05-09 DIAGNOSIS — Z940 Kidney transplant status: Secondary | ICD-10-CM | POA: Diagnosis not present

## 2017-05-09 LAB — CBC W/ DIFFERENTIAL
BANDED NEUTROPHILS ABSOLUTE COUNT: 0 10*3/uL (ref 0.0–0.1)
BASOPHILS ABSOLUTE COUNT: 0 10*3/uL (ref 0.0–0.2)
BASOPHILS RELATIVE PERCENT: 0 %
EOSINOPHILS ABSOLUTE COUNT: 0.1 10*3/uL (ref 0.0–0.4)
EOSINOPHILS RELATIVE PERCENT: 1 %
HEMATOCRIT: 40.4 % (ref 37.5–51.0)
HEMOGLOBIN: 13.1 g/dL (ref 13.0–17.7)
IMMATURE GRANULOCYTES: 0 %
LYMPHOCYTES ABSOLUTE COUNT: 1 10*3/uL (ref 0.7–3.1)
LYMPHOCYTES RELATIVE PERCENT: 18 %
MEAN CORPUSCULAR HEMOGLOBIN CONC: 32.4 g/dL (ref 31.5–35.7)
MEAN CORPUSCULAR HEMOGLOBIN: 26.4 pg — ABNORMAL LOW (ref 26.6–33.0)
MEAN CORPUSCULAR VOLUME: 82 fL (ref 79–97)
MONOCYTES ABSOLUTE COUNT: 0.4 10*3/uL (ref 0.1–0.9)
MONOCYTES RELATIVE PERCENT: 8 %
NEUTROPHILS ABSOLUTE COUNT: 4.2 10*3/uL (ref 1.4–7.0)
RED BLOOD CELL COUNT: 4.96 x10E6/uL (ref 4.14–5.80)
RED CELL DISTRIBUTION WIDTH: 14.9 % (ref 12.3–15.4)
WHITE BLOOD CELL COUNT: 5.7 10*3/uL (ref 3.4–10.8)

## 2017-05-09 LAB — BANDED NEUTROPHILS ABSOLUTE COUNT: Lab: 0

## 2017-05-10 LAB — PHOSPHORUS, SERUM: Lab: 3.5

## 2017-05-10 LAB — MAGNESIUM: Lab: 1.8

## 2017-05-10 LAB — BASIC METABOLIC PANEL
BLOOD UREA NITROGEN: 27 mg/dL — ABNORMAL HIGH (ref 6–24)
BUN / CREAT RATIO: 19 (ref 9–20)
CALCIUM: 9.9 mg/dL (ref 8.7–10.2)
CHLORIDE: 107 mmol/L — ABNORMAL HIGH (ref 96–106)
CO2: 20 mmol/L (ref 20–29)
CREATININE: 1.39 mg/dL — ABNORMAL HIGH (ref 0.76–1.27)
GFR MDRD AF AMER: 72 mL/min/{1.73_m2}
GFR MDRD NON AF AMER: 63 mL/min/{1.73_m2}
GLUCOSE: 84 mg/dL (ref 65–99)
POTASSIUM: 5.1 mmol/L (ref 3.5–5.2)

## 2017-05-10 LAB — TACROLIMUS BLOOD: Lab: 11.3

## 2017-05-10 LAB — CO2: Lab: 20

## 2017-05-17 ENCOUNTER — Telehealth: Payer: Self-pay | Admitting: Neurology

## 2017-05-17 NOTE — Telephone Encounter (Signed)
We have attempted to call the patient two times to schedule sleep study. Patient has been unavailable at the phone numbers we have on file, and has not returned our calls. At this time, we will send a letter asking patient to please contact the sleep lab.  °

## 2017-05-20 DIAGNOSIS — H401122 Primary open-angle glaucoma, left eye, moderate stage: Secondary | ICD-10-CM | POA: Diagnosis not present

## 2017-05-23 ENCOUNTER — Telehealth: Payer: Self-pay | Admitting: Neurology

## 2017-05-23 NOTE — Telephone Encounter (Signed)
We have attempted to call the patient two times to schedule sleep study. Patient has been unavailable at the phone numbers we have on file, and has not returned our calls. At this time, we will send a letter asking patient to please contact the sleep lab.  °

## 2017-05-25 MED FILL — BD NANO PEN NEEDLE/32G/4MM/NDL: BD NANO PEN NEEDLE/32G/4MM/NDL | 25 days supply | Qty: 1 | Fill #2

## 2017-05-25 MED FILL — METOPROLOL TARTRATE/50MG/TABS: METOPROLOL TARTRATE/50MG/TABS | 30 days supply | Qty: 90 | Fill #9

## 2017-05-27 NOTE — Unmapped (Signed)
Patient does not need a refill of specialty medication at this time. Moving specialty refill reminder call to appropriate date and removed call attempt data.  Spoke with patient and he states he still has 2 week worth of medication.

## 2017-06-01 DIAGNOSIS — E1129 Type 2 diabetes mellitus with other diabetic kidney complication: Secondary | ICD-10-CM | POA: Diagnosis not present

## 2017-06-01 DIAGNOSIS — Z794 Long term (current) use of insulin: Secondary | ICD-10-CM | POA: Diagnosis not present

## 2017-06-01 DIAGNOSIS — Z6831 Body mass index (BMI) 31.0-31.9, adult: Secondary | ICD-10-CM | POA: Diagnosis not present

## 2017-06-01 DIAGNOSIS — Z940 Kidney transplant status: Secondary | ICD-10-CM | POA: Diagnosis not present

## 2017-06-01 DIAGNOSIS — I10 Essential (primary) hypertension: Secondary | ICD-10-CM | POA: Diagnosis not present

## 2017-06-02 MED ORDER — TACROLIMUS 1 MG CAPSULE
ORAL_CAPSULE | Freq: Two times a day (BID) | ORAL | 11 refills | 0.00000 days | Status: CP
Start: 2017-06-02 — End: 2017-06-02

## 2017-06-02 MED ORDER — MYCOPHENOLATE SODIUM 180 MG TABLET,DELAYED RELEASE
ORAL_TABLET | Freq: Two times a day (BID) | ORAL | 11 refills | 0.00000 days | Status: CP
Start: 2017-06-02 — End: 2017-06-02

## 2017-06-02 MED ORDER — TACROLIMUS 1 MG CAPSULE: 6 mg | capsule | Freq: Two times a day (BID) | 11 refills | 0 days | Status: AC

## 2017-06-02 MED ORDER — MYFORTIC 180 MG TABLET,DELAYED RELEASE
ORAL_TABLET | ORAL | 11 refills | 0.00000 days | Status: CP
Start: 2017-06-02 — End: 2018-06-07

## 2017-06-02 MED ORDER — MYFORTIC 180 MG TABLET,DELAYED RELEASE: tablet | 11 refills | 0 days

## 2017-06-02 MED ORDER — PROGRAF 1 MG CAPSULE
ORAL_CAPSULE | 11 refills | 0 days | Status: CP
Start: 2017-06-02 — End: 2018-06-07

## 2017-06-02 NOTE — Unmapped (Signed)
[facility] Refill Coordination Note  Specialty Medication(s): Myfortic 180mg  & Tacrolimus 1mg   Additional Medications shipped: Omeprazole 20mg     Benjamin Maldonado, DOB: 18-Nov-1975  Phone: [phone] (home) , Alternate phone contact: N/A  Phone or address changes today?: No  All above HIPAA information was verified with patient.  Shipping Address: [address]  [address]  [place] [address]   Insurance changes? No    Completed refill call assessment today to schedule patient's medication shipment from the [facility] [phone]).      Confirmed the medication and dosage are correct and have not changed: Yes, regimen is correct and unchanged.    Confirmed patient started or stopped the following medications in the past month:  No, there are no changes reported at this time.    Are you tolerating your medication?:  Benjamin Maldonado reports tolerating the medication.    ADHERENCE    (Below is required for Medicare Part B or Transplant patients only - per drug):   How many tablets were dispensed last month:     Myfortic 180 mg   Quantity filled last month: 180   # of tablets left on hand: 9 days    Tacrolimus 1 mg   Quantity filled last month: 360   # of tablets left on hand: 9 days    Did you miss any doses in the past 4 weeks? No missed doses reported.    FINANCIAL/SHIPPING    Delivery Scheduled: Yes, Expected medication delivery date: 06/10/2017     Benjamin Maldonado did not have any additional questions at this time.    Delivery address validated in FSI scheduling system: Yes, address listed in FSI is correct.    We will follow up with patient monthly for standard refill processing and delivery.      Thank you,  Tamala Fothergill   [facility] Specialty Technician

## 2017-06-07 NOTE — Unmapped (Signed)
[facility] orders entered/released for CMV/BK

## 2017-06-08 DIAGNOSIS — E785 Hyperlipidemia, unspecified: Secondary | ICD-10-CM | POA: Diagnosis not present

## 2017-06-08 DIAGNOSIS — Z940 Kidney transplant status: Secondary | ICD-10-CM | POA: Diagnosis not present

## 2017-06-08 DIAGNOSIS — E1129 Type 2 diabetes mellitus with other diabetic kidney complication: Secondary | ICD-10-CM | POA: Diagnosis not present

## 2017-06-09 MED FILL — OMEPRAZOLE/20MG/CPDR: OMEPRAZOLE/20MG/CPDR | 30 days supply | Qty: 30 | Fill #8

## 2017-06-09 MED FILL — TACROLIMUS/1MG/CAPS: TACROLIMUS/1MG/CAPS | 30 days supply | Qty: 360 | Fill #11

## 2017-06-09 MED FILL — MYFORTIC/180MG/TAB: MYFORTIC/180MG/TAB | 30 days supply | Qty: 180 | Fill #11

## 2017-06-10 MED FILL — TACROLIMUS/1MG/CAPS: TACROLIMUS/1MG/CAPS | 30 days supply | Qty: 360 | Fill #0

## 2017-06-16 ENCOUNTER — Ambulatory Visit (INDEPENDENT_AMBULATORY_CARE_PROVIDER_SITE_OTHER): Payer: Medicare Other | Admitting: Neurology

## 2017-06-16 DIAGNOSIS — G4733 Obstructive sleep apnea (adult) (pediatric): Secondary | ICD-10-CM

## 2017-06-16 DIAGNOSIS — G4734 Idiopathic sleep related nonobstructive alveolar hypoventilation: Secondary | ICD-10-CM

## 2017-06-16 DIAGNOSIS — G2581 Restless legs syndrome: Secondary | ICD-10-CM

## 2017-06-16 DIAGNOSIS — R0683 Snoring: Secondary | ICD-10-CM

## 2017-06-16 DIAGNOSIS — E669 Obesity, unspecified: Secondary | ICD-10-CM

## 2017-06-16 DIAGNOSIS — R351 Nocturia: Secondary | ICD-10-CM

## 2017-06-16 DIAGNOSIS — R0681 Apnea, not elsewhere classified: Secondary | ICD-10-CM

## 2017-06-21 ENCOUNTER — Other Ambulatory Visit: Payer: Self-pay | Admitting: Neurology

## 2017-06-21 DIAGNOSIS — E669 Obesity, unspecified: Secondary | ICD-10-CM

## 2017-06-21 DIAGNOSIS — G4733 Obstructive sleep apnea (adult) (pediatric): Secondary | ICD-10-CM

## 2017-06-21 DIAGNOSIS — G4734 Idiopathic sleep related nonobstructive alveolar hypoventilation: Secondary | ICD-10-CM

## 2017-06-21 NOTE — Progress Notes (Signed)
Patient referred by Dr. Osborne Casco, seen by me on 04/04/17, diagnostic PSG on 06/16/17.   Please call and notify the patient that the recent sleep study showed severe obstructive sleep apnea, with a total AHI of 35.9/hour, REM AHI of 50.3/hour, and O2 nadir of 64%. I recommend treatment for this in the form of CPAP. This will require a repeat sleep study for proper titration and mask fitting and correct monitoring of the oxygen saturations. Please explain to patient. I have placed an order in the chart. Thanks.  Star Age, MD, PhD [facility])

## 2017-06-21 NOTE — Procedures (Signed)
PATIENT'S NAME:  Anthony Blanchard, Anthony Blanchard DOB:      11/14/1975      MR#:    [id number]     DATE OF RECORDING: 06/16/2017 REFERRING M.D.:  Domenick Gong, MD Study Performed:   Baseline Polysomnogram HISTORY: 42 year old man with a history of diabetes, neuropathy, kidney failure and s/p kidney transplant, hypertension, reflux disease, right eye blindness, heart disease and obesity, who reports snoring and witnessed apneic breathing pauses while asleep. The patient endorsed the Epworth Sleepiness Scale at 0 points. The patient's weight 222 pounds with a height of 69 (inches), resulting in a BMI of 33. kg/m2. The patient's neck circumference measured 18.4 inches.  CURRENT MEDICATIONS: Aspirin, Vitamin D, Lantus, Humalog, Mag-Ox, Lopressor, Myfortic, Prilosec, Prograf, Lipitor   PROCEDURE:  This is a multichannel digital polysomnogram utilizing the Somnostar 11.2 system.  Electrodes and sensors were applied and monitored per AASM Specifications.   EEG, EOG, Chin and Limb EMG, were sampled at 200 Hz.  ECG, Snore and Nasal Pressure, Thermal Airflow, Respiratory Effort, CPAP Flow and Pressure, Oximetry was sampled at 50 Hz. Digital video and audio were recorded.      BASELINE STUDY  Lights Out was at 22:26 and Lights On at 05:08.  Total recording time (TRT) was 402 minutes, with a total sleep time (TST) of  371 minutes.   The patient's sleep latency was 16.5 minutes.  REM latency was 109 minutes.  The sleep efficiency was 92.3 %.     SLEEP ARCHITECTURE: WASO (Wake after sleep onset) was 24 minutes.  There were 14.5 minutes in Stage N1, 271 minutes Stage N2, 17.5 minutes Stage N3 and 68 minutes in Stage REM.  The percentage of Stage N1 was 3.9%, Stage N2 was 73.%, which is increased, Stage N3 was 4.7% and Stage R (REM sleep) was 18.3%. The arousals were noted as: 46 were spontaneous, 1 were associated with PLMs, 138 were associated with respiratory events.  Audio and video analysis did not show any abnormal or  unusual movements, behaviors, phonations or vocalizations. The patient took 1 bathroom break. Moderate snoring was noted. The EKG was in keeping with normal sinus rhythm (NSR).  RESPIRATORY ANALYSIS:  There were a total of 222 respiratory events:  143 obstructive apneas, 1 central apneas and 5 mixed apneas with a total of 149 apneas and an apnea index (AI) of 24.1 /hour. There were 73 hypopneas with a hypopnea index of 11.8 /hour. The patient also had 0 respiratory event related arousals (RERAs).      The total APNEA/HYPOPNEA INDEX (AHI) was 35.9/hour and the total RESPIRATORY DISTURBANCE INDEX was 35.9 /hour.  57 events occurred in REM sleep and 152 events in NREM. The REM AHI was 50.3 /hour, versus a non-REM AHI of 32.7. The patient spent 0 minutes of total sleep time in the supine position and 371 minutes in non-supine.. The supine AHI was n/a versus a non-supine AHI of 35.9.  OXYGEN SATURATION & C02:  The Wake baseline 02 saturation was 98%, with the lowest being 64%. Time spent below 89% saturation equaled 63 minutes.  PERIODIC LIMB MOVEMENTS: The patient had a total of 23 Periodic Limb Movements.  The Periodic Limb Movement (PLM) index was 3.7 and the PLM Arousal index was .2/hour.  Post-study, the patient indicated that sleep was the same as usual.   IMPRESSION:  1. Obstructive Sleep Apnea (OSA) 2. Nocturnal hypoxemia  RECOMMENDATIONS:  1. This study demonstrates severe obstructive sleep apnea, with a total AHI of 35.9/hour, REM AHI of  50.3/hour, and O2 nadir of 64%. There was associated nocturnal hypoxemia with significant time below 89% saturation of over one hour. The absence of supine sleep likely underestimates his sleep disordered breathing. Treatment with positive airway pressure in the form of CPAP is recommended. This will require a full night titration study to optimize therapy. Other treatment options may include avoidance of supine sleep position along with weight loss, upper  airway or jaw surgery in selected patients or the use of an oral appliance in certain patients. ENT evaluation and/or consultation with a maxillofacial surgeon or dentist may be feasible in some instances.    2. Please note that untreated obstructive sleep apnea carries additional perioperative morbidity. Patients with significant obstructive sleep apnea should receive perioperative PAP therapy and the surgeons and particularly the anesthesiologist should be informed of the diagnosis and the severity of the sleep disordered breathing. 3. The patient should be cautioned not to drive, work at heights, or operate dangerous or heavy equipment when tired or sleepy. Review and reiteration of good sleep hygiene measures should be pursued with any patient. 4. The patient will be seen in follow-up by Dr. Rexene Alberts at [facility] for discussion of the test results and further management strategies. The referring provider will be notified of the test results.  I certify that I have reviewed the entire raw data recording prior to the issuance of this report in accordance with the Standards of Accreditation of the American Academy of Sleep Medicine (AASM)   Star Age, MD, PhD Diplomat, American Board of Psychiatry and Neurology (Neurology and Sleep Medicine)

## 2017-06-22 ENCOUNTER — Telehealth: Payer: Self-pay | Admitting: Neurology

## 2017-06-22 NOTE — Telephone Encounter (Signed)
-----   Message from Star Age, MD sent at 06/21/2017  9:57 AM EST ----- Patient referred by Dr. Osborne Casco, seen by me on 04/04/17, diagnostic PSG on 06/16/17.   Please call and notify the patient that the recent sleep study showed severe obstructive sleep apnea, with a total AHI of 35.9/hour, REM AHI of 50.3/hour, and O2 nadir of 64%. I recommend treatment for this in the form of CPAP. This will require a repeat sleep study for proper titration and mask fitting and correct monitoring of the oxygen saturations. Please explain to patient. I have placed an order in the chart. Thanks.  Star Age, MD, PhD [facility])

## 2017-06-22 NOTE — Telephone Encounter (Signed)
I called pt. I advised pt that Dr. Rexene Alberts reviewed their sleep study results and found that the patient has sleep apnea and recommends that pt be treated with a cpap. Dr. Rexene Alberts recommends that pt return for a repeat sleep study in order to properly titrate the cpap and ensure a good mask fit. Pt is agreeable to returning for a titration study. I advised pt that our sleep lab will file with pt's insurance and call pt to schedule the sleep study when we hear back from the pt's insurance regarding coverage of this sleep study. Pt verbalized understanding of results. Pt had no questions at this time but was encouraged to call back if questions arise.

## 2017-06-22 NOTE — Unmapped (Signed)
duplicate

## 2017-06-22 NOTE — Unmapped (Signed)
No labs since Jan.  Instructed pt to come to [facility] for labs since his local [facility] is not using our orders.  He will arrange for transportation and come in the next week or two.

## 2017-06-27 MED FILL — LANTUS SOLOSTAR (BOX)/100UNIT/ML/SOLN: LANTUS SOLOSTAR (BOX)/100UNIT/ML/SOLN | 30 days supply | Qty: 1 | Fill #2

## 2017-06-27 MED FILL — NOVOLOG FLEXPEN(BOX)/100UNIT/ML/INJ: NOVOLOG FLEXPEN(BOX)/100UNIT/ML/INJ | 41 days supply | Qty: 1 | Fill #3

## 2017-06-27 MED FILL — METOPROLOL TARTRATE/50MG/TABS: METOPROLOL TARTRATE/50MG/TABS | 30 days supply | Qty: 90 | Fill #10

## 2017-06-27 MED FILL — BD NANO PEN NEEDLE/32G/4MM/NDL: BD NANO PEN NEEDLE/32G/4MM/NDL | 25 days supply | Qty: 1 | Fill #3

## 2017-06-28 ENCOUNTER — Non-Acute Institutional Stay: Admit: 2017-06-28 | Discharge: 2017-06-29 | Payer: MEDICARE

## 2017-06-28 DIAGNOSIS — Z79899 Other long term (current) drug therapy: Secondary | ICD-10-CM

## 2017-06-28 DIAGNOSIS — Z940 Kidney transplant status: Principal | ICD-10-CM

## 2017-06-28 DIAGNOSIS — Z944 Liver transplant status: Secondary | ICD-10-CM | POA: Diagnosis not present

## 2017-06-28 LAB — COMPREHENSIVE METABOLIC PANEL
ALBUMIN: 4.3 g/dL (ref 3.5–5.0)
ALKALINE PHOSPHATASE: 56 U/L (ref 38–126)
ALT (SGPT): 30 U/L (ref 19–72)
ANION GAP: 10 mmol/L (ref 9–15)
BILIRUBIN TOTAL: 0.7 mg/dL (ref 0.0–1.2)
BLOOD UREA NITROGEN: 32 mg/dL — ABNORMAL HIGH (ref 7–21)
CALCIUM: 10.2 mg/dL (ref 8.5–10.2)
CHLORIDE: 110 mmol/L — ABNORMAL HIGH (ref 98–107)
CO2: 20 mmol/L — ABNORMAL LOW (ref 22.0–30.0)
CREATININE: 1.44 mg/dL — ABNORMAL HIGH (ref 0.70–1.30)
EGFR MDRD AF AMER: >=60 mL/min/{1.73_m2} (ref >=60)
EGFR MDRD NON AF AMER: 54 mL/min/{1.73_m2} — ABNORMAL LOW (ref >=60)
GLUCOSE RANDOM: 127 mg/dL (ref 65–179)
POTASSIUM: 5.5 mmol/L — ABNORMAL HIGH (ref 3.5–5.0)
PROTEIN TOTAL: 6.8 g/dL (ref 6.5–8.3)
SODIUM: 140 mmol/L (ref 135–145)

## 2017-06-28 LAB — MAGNESIUM: Magnesium:MCnc:Pt:Ser/Plas:Qn:: 1.4 — ABNORMAL LOW

## 2017-06-28 LAB — CBC W/ AUTO DIFF
BASOPHILS ABSOLUTE COUNT: 0 10*9/L (ref 0.0–0.1)
BASOPHILS RELATIVE PERCENT: 0.5 %
EOSINOPHILS ABSOLUTE COUNT: 0.1 10*9/L (ref 0.0–0.4)
HEMATOCRIT: 39.8 % — ABNORMAL LOW (ref 41.0–53.0)
HEMOGLOBIN: 11.8 g/dL — ABNORMAL LOW (ref 13.5–17.5)
LARGE UNSTAINED CELLS: 3 % (ref 0–4)
LYMPHOCYTES ABSOLUTE COUNT: 0.9 10*9/L — ABNORMAL LOW (ref 1.5–5.0)
LYMPHOCYTES RELATIVE PERCENT: 14.2 %
MEAN CORPUSCULAR HEMOGLOBIN CONC: 29.7 g/dL — ABNORMAL LOW (ref 31.0–37.0)
MEAN CORPUSCULAR HEMOGLOBIN: 26 pg (ref 26.0–34.0)
MEAN CORPUSCULAR VOLUME: 87.6 fL (ref 80.0–100.0)
MEAN PLATELET VOLUME: 9.3 fL (ref 7.0–10.0)
MONOCYTES ABSOLUTE COUNT: 0.4 10*9/L (ref 0.2–0.8)
NEUTROPHILS ABSOLUTE COUNT: 5 10*9/L (ref 2.0–7.5)
NEUTROPHILS RELATIVE PERCENT: 75.5 %
PLATELET COUNT: 210 10*9/L (ref 150–440)
RED BLOOD CELL COUNT: 4.54 10*12/L (ref 4.50–5.90)
RED CELL DISTRIBUTION WIDTH: 15.1 % — ABNORMAL HIGH (ref 12.0–15.0)
WBC ADJUSTED: 6.6 10*9/L (ref 4.5–11.0)

## 2017-06-28 LAB — SMEAR REVIEW

## 2017-06-28 LAB — MEAN CORPUSCULAR VOLUME: Lab: 87.6

## 2017-06-28 LAB — TACROLIMUS, TROUGH: Lab: 6.3

## 2017-06-28 LAB — ALKALINE PHOSPHATASE: Alkaline phosphatase:CCnc:Pt:Ser/Plas:Qn:: 56

## 2017-06-28 LAB — PHOSPHORUS: Phosphate:MCnc:Pt:Ser/Plas:Qn:: 3

## 2017-06-28 NOTE — Unmapped (Signed)
Patient states he has enough tac and myfortic for another month. Requests call back in 3 weeks - adjusting call dates.

## 2017-06-29 LAB — CMV DNA, QUANTITATIVE, PCR: CMV VIRAL LD: NOT DETECTED

## 2017-06-29 LAB — CMV VIRAL LD: Lab: NOT DETECTED

## 2017-07-01 LAB — BK VIRUS QUANTITATIVE PCR, BLOOD

## 2017-07-01 LAB — BK BLOOD RESULT: Lab: NOT DETECTED

## 2017-07-13 DIAGNOSIS — Z0100 Encounter for examination of eyes and vision without abnormal findings: Secondary | ICD-10-CM | POA: Diagnosis not present

## 2017-07-19 NOTE — Unmapped (Signed)
[facility] Refill and Clinical Coordination Note  Medication(s): TACROLIMUS, MYFORTIC    Benjamin Maldonado, DOB: Feb 13, 1976  Phone: [phone] (home) , Alternate phone contact: N/A  Shipping address: [address]  [address]  [place] [address]  Phone or address changes today?: No  All above HIPAA information verified.  Insurance changes? No    Completed refill and clinical call assessment today to schedule patient's medication shipment from the [facility] [phone]).      MEDICATION RECONCILIATION    Confirmed the medication and dosage are correct and have not changed: Yes, regimen is correct and unchanged.    Were there any changes to your medication(s) in the past month:  No, there are no changes reported at this time.    ADHERENCE    Is this medicine transplant or covered by Medicare Part B? Yes.    Myfortic 180 mg   Quantity filled last month: 180   # of tablets left on hand: 10 DAYS LEFT  Tacrolimus 1 mg   Quantity filled last month: 360   # of tablets left on hand: 10 DAYS LEFT    Did you miss any doses in the past 4 weeks? No missed doses reported.Was late on 1 morning dose but didn't miss any.  Adherence counseling provided? Yes, discussed the following ways to help with adherence: pillbox, alarm .     SIDE EFFECT MANAGEMENT    Are you tolerating your medication?:  Elston reports tolerating the medication.  Side effect management discussed: None      Therapy is appropriate and should be continued.    Evidence of clinical benefit: See Epic note from 03/23/17      FINANCIAL/SHIPPING    Delivery Scheduled: Yes, Expected medication delivery date: 07/25/17   Additional medications refilled: omeprazole - 3 rx total    The patient will receive an FSI print out for each medication shipped and additional FDA Medication Guides as required.  Patient education from [place] or Robet Leu may also be included in the shipment.    Milind did not have any additional questions at this time.    Delivery address validated in FSI scheduling system: Yes, address listed above is correct.      We will follow up with patient monthly for standard refill processing and delivery.      Thank you,  [name]   [facility] Specialty Pharmacist

## 2017-07-22 ENCOUNTER — Ambulatory Visit (INDEPENDENT_AMBULATORY_CARE_PROVIDER_SITE_OTHER): Payer: Medicare Other | Admitting: Neurology

## 2017-07-22 DIAGNOSIS — G4761 Periodic limb movement disorder: Secondary | ICD-10-CM

## 2017-07-22 DIAGNOSIS — E669 Obesity, unspecified: Secondary | ICD-10-CM

## 2017-07-22 DIAGNOSIS — G4734 Idiopathic sleep related nonobstructive alveolar hypoventilation: Secondary | ICD-10-CM

## 2017-07-22 DIAGNOSIS — G4733 Obstructive sleep apnea (adult) (pediatric): Secondary | ICD-10-CM

## 2017-07-22 MED FILL — OMEPRAZOLE/20MG/CPDR: OMEPRAZOLE/20MG/CPDR | 30 days supply | Qty: 30 | Fill #9

## 2017-07-22 MED FILL — TACROLIMUS/1MG/CAPS: TACROLIMUS/1MG/CAPS | 30 days supply | Qty: 360 | Fill #1

## 2017-07-22 MED FILL — MYFORTIC/180MG/TAB: MYFORTIC/180MG/TAB | 30 days supply | Qty: 180 | Fill #0

## 2017-07-26 ENCOUNTER — Telehealth: Payer: Self-pay

## 2017-07-26 NOTE — Progress Notes (Signed)
Patient referred by Dr. Osborne Casco, seen by me on 04/04/17, diagnostic PSG on 06/16/17. Patient had a CPAP titration study on 07/22/17.  Please call and inform patient that I have entered an order for treatment with positive airway pressure (PAP) treatment for obstructive sleep apnea (OSA). He did well during the latest sleep study with CPAP. We will, therefore, arrange for a machine for home use through a DME (durable medical equipment) company of His choice; and I will see the patient back in follow-up in about 10 weeks. Please also explain to the patient that I will be looking out for compliance data, which can be downloaded from the machine (stored on an SD card, that is inserted in the machine) or via remote access through a modem, that is built into the machine. At the time of the followup appointment we will discuss sleep study results and how it is going with PAP treatment at home. Please advise patient to bring His machine at the time of the first FU visit, even though this is cumbersome. Bringing the machine for every visit after that will likely not be needed, but often helps for the first visit to troubleshoot if needed. Please re-enforce the importance of compliance with treatment and the need for [place] to monitor compliance data - often an insurance requirement and actually good feedback for the patient as far as how they are doing.  Also remind patient, that any interim PAP machine or mask issues should be first addressed with the DME company, as they can often help better with technical and mask fit issues. Please ask if patient has a preference regarding DME company.  Please also make sure, the patient has a follow-up appointment with me in about 10 weeks from the setup date, thanks. May see one of our nurse practitioners if needed for proper timing of the FU appointment.  Please fax or rout report to the referring provider. Thanks,   Star Age, MD, PhD [facility])

## 2017-07-26 NOTE — Telephone Encounter (Signed)
I called pt. I advised pt that Dr. Rexene Alberts reviewed their sleep study results and found that pt did well with the cpap during his latest sleep study. Dr. Rexene Alberts recommends that pt start a cpap at home. I reviewed PAP compliance expectations with the pt. Pt is agreeable to starting a CPAP. I advised pt that an order will be sent to a DME, [facility], and [facility] will call the pt within about one week after they file with the pt's insurance. [facility] will show the pt how to use the machine, fit for masks, and troubleshoot the CPAP if needed. A follow up appt was made for insurance purposes with Dr. Rexene Alberts on 10/24/17 at 10:30am. Pt verbalized understanding to arrive 15 minutes early and bring their CPAP. A letter with all of this information in it will be mailed to the pt as a reminder. I verified with the pt that the address we have on file is correct. Pt verbalized understanding of results. Pt had no questions at this time but was encouraged to call back if questions arise.

## 2017-07-26 NOTE — Addendum Note (Signed)
Addended by: Star Age on: 07/26/2017 11:25 AM   Modules accepted: Orders

## 2017-07-26 NOTE — Telephone Encounter (Signed)
-----   Message from Star Age, MD sent at 07/26/2017 11:25 AM EDT ----- Patient referred by Dr. Osborne Casco, seen by me on 04/04/17, diagnostic PSG on 06/16/17. Patient had a CPAP titration study on 07/22/17.  Please call and inform patient that I have entered an order for treatment with positive airway pressure (PAP) treatment for obstructive sleep apnea (OSA). He did well during the latest sleep study with CPAP. We will, therefore, arrange for a machine for home use through a DME (durable medical equipment) company of His choice; and I will see the patient back in follow-up in about 10 weeks. Please also explain to the patient that I will be looking out for compliance data, which can be downloaded from the machine (stored on an SD card, that is inserted in the machine) or via remote access through a modem, that is built into the machine. At the time of the followup appointment we will discuss sleep study results and how it is going with PAP treatment at home. Please advise patient to bring His machine at the time of the first FU visit, even though this is cumbersome. Bringing the machine for every visit after that will likely not be needed, but often helps for the first visit to troubleshoot if needed. Please re-enforce the importance of compliance with treatment and the need for [place] to monitor compliance data - often an insurance requirement and actually good feedback for the patient as far as how they are doing.  Also remind patient, that any interim PAP machine or mask issues should be first addressed with the DME company, as they can often help better with technical and mask fit issues. Please ask if patient has a preference regarding DME company.  Please also make sure, the patient has a follow-up appointment with me in about 10 weeks from the setup date, thanks. May see one of our nurse practitioners if needed for proper timing of the FU appointment.  Please fax or rout report to the referring provider.  Thanks,   Star Age, MD, PhD [facility])

## 2017-07-26 NOTE — Procedures (Signed)
PATIENT'S NAME:  Anthony, Blanchard DOB:      07-14-1975      MR#:    [id number]     DATE OF RECORDING: 07/22/2017 REFERRING M.D.:  Domenick Gong, MD Study Performed:   CPAP  Titration HISTORY: 42 year old man with a history of diabetes, neuropathy, kidney failure and s/p kidney transplant, hypertension, reflux disease, right eye blindness, heart disease and obesity, who presents for a CPAP titration. His baseline sleep study from 06/16/17 showed a total AHI of 35.9/hour, REM AHI of 50.3/hour, and O2 nadir of 64%. The patient's weight 222 pounds with a height of 69 (inches), resulting in a BMI of 33. kg/m2. The patient's neck circumference measured 18.4 inches.  CURRENT MEDICATIONS: Aspirin, Vitamin D, Lantus, Humalog, Mag-Ox, Lopressor, Myfortic, Prilosec, Prograf, Lipitor  PROCEDURE:  This is a multichannel digital polysomnogram utilizing the SomnoStar 11.2 system.  Electrodes and sensors were applied and monitored per AASM Specifications.   EEG, EOG, Chin and Limb EMG, were sampled at 200 Hz.  ECG, Snore and Nasal Pressure, Thermal Airflow, Respiratory Effort, CPAP Flow and Pressure, Oximetry was sampled at 50 Hz. Digital video and audio were recorded.      The patient was fitted with a standard N10 nasal mask. CPAP was initiated at 5 cmH20 with heated humidity per AASM standards and pressure was advanced to 8 cmH20 because of hypopneas, apneas and desaturations. At a PAP pressure of 8 cmH20, there was a reduction of the AHI to 0.2/hour with supine REM sleep achieved and O2 nadir of 92%.     Lights Out was at 22:18 and Lights On at 05:12. Total recording time (TRT) was 414.5 minutes, with a total sleep time (TST) of 353 minutes. The patient's sleep latency was 39.5 minutes, which is delayed. REM latency was 79.5 minutes, which is normal. The sleep efficiency was 85.2 %.    SLEEP ARCHITECTURE: WASO (Wake after sleep onset) was 29.5 minutes with mild sleep fragmentation noted. There were 11 minutes in  Stage N1, 206 minutes Stage N2, 51 minutes Stage N3 and 85 minutes in Stage REM.  The percentage of Stage N1 was 3.1%, Stage N2 was 58.4%, which is mildly increased, Stage N3 was 14.4% and Stage R (REM sleep) was 24.1%, which is normal. The arousals were noted as: 6 were spontaneous, 0 were associated with PLMs, 3 were associated with respiratory events.  Audio and video analysis did not show any abnormal or unusual movements, behaviors, phonations or vocalizations. The patient took no bathroom breaks. The EKG was in keeping with normal sinus rhythm (NSR).  RESPIRATORY ANALYSIS:  There was a total of 7 respiratory events: 0 obstructive apneas, 0 central apneas and 0 mixed apneas with a total of 0 apneas and an apnea index (AI) of 0 /hour. There were 7 hypopneas with a hypopnea index of 1.2/hour. The patient also had 0 respiratory event related arousals (RERAs).      The total APNEA/HYPOPNEA INDEX  (AHI) was 1.2 /hour and the total RESPIRATORY DISTURBANCE INDEX was 1.2 .hour  1 events occurred in REM sleep and 6 events in NREM. The REM AHI was .7 /hour versus a non-REM AHI of 1.3 /hour.  The patient spent 59.5 minutes of total sleep time in the supine position and 294 minutes in non-supine. The supine AHI was 0.0, versus a non-supine AHI of 1.4.  OXYGEN SATURATION & C02:  The baseline 02 saturation was 96%, with the lowest being 92%. Time spent below 89% saturation equaled 0 minutes.  PERIODIC LIMB MOVEMENTS: The patient had a total of 99 Periodic Limb Movements. The Periodic Limb Movement (PLM) index was 16.8 and the PLM Arousal index was 0 /hour.  Post-study, the patient indicated that sleep was the same as usual.   DIAGNOSIS  1. Obstructive Sleep Apnea  2. Periodic Limb Movement Disorder   RECOMMENDATIONS:   1. This study demonstrates resolution of the patient's obstructive sleep apnea with CPAP therapy. I will, therefore, start the patient on home CPAP treatment at a pressure of 8 cm via  nasal mask with heated humidity. The patient should be reminded to be fully compliant with PAP therapy to improve sleep related symptoms and decrease long term cardiovascular risks. The patient should be reminded, that it may take up to 3 months to get fully used to using PAP with all planned sleep. The earlier full compliance is achieved, the better long term compliance tends to be. Please note that untreated obstructive sleep apnea carries additional perioperative morbidity. Patients with significant obstructive sleep apnea should receive perioperative PAP therapy and the surgeons and particularly the anesthesiologist should be informed of the diagnosis and the severity of the sleep disordered breathing. 2. Mild PLMs (periodic limb movements of sleep) were noted during this study with no associated arousals; clinical correlation is recommended.  3. The patient should be cautioned not to drive, work at heights, or operate dangerous or heavy equipment when tired or sleepy. Review and reiteration of good sleep hygiene measures should be pursued with any patient. 4. The patient will be seen in follow-up by Dr. Rexene Alberts at [facility] for discussion of the test results and further management strategies. The referring provider will be notified of the test results.   I certify that I have reviewed the entire raw data recording prior to the issuance of this report in accordance with the Standards of Accreditation of the American Academy of Sleep Medicine (AASM)     Star Age, MD, PhD Diplomat, American Board of Psychiatry and Neurology (Neurology and Sleep Medicine)

## 2017-07-28 ENCOUNTER — Telehealth: Payer: Self-pay | Admitting: *Deleted

## 2017-07-28 NOTE — Telephone Encounter (Signed)
I spoke with pt, and he states he is diabetic and asked what he could put on it. I told pt if felt he needed to be seen today or tomorrow and transferred to the schedulers. Pt was scheduled for 07/29/2017 at 11:15am with Dr. Paulla Dolly. I asked the scheduler to transfer pt back to me, and instructed pt to cover area with a clean [place] sock and keep clean and dry if there was no broken skin, but if skin was broken then to cover with light coating of antibiotic ointment. Pt states understanding.

## 2017-07-28 NOTE — Telephone Encounter (Signed)
Pt left name, DOB and phone number.

## 2017-07-28 NOTE — Telephone Encounter (Signed)
Pt states he took a shower last night and now he looks at his foot he thinks he may have burned it.

## 2017-07-29 ENCOUNTER — Ambulatory Visit (INDEPENDENT_AMBULATORY_CARE_PROVIDER_SITE_OTHER): Payer: Medicare Other | Admitting: Podiatry

## 2017-07-29 ENCOUNTER — Encounter: Payer: Self-pay | Admitting: Podiatry

## 2017-07-29 DIAGNOSIS — L03119 Cellulitis of unspecified part of limb: Secondary | ICD-10-CM

## 2017-07-29 DIAGNOSIS — L02619 Cutaneous abscess of unspecified foot: Secondary | ICD-10-CM

## 2017-07-29 MED ORDER — DOXYCYCLINE HYCLATE 100 MG PO TABS: 100.0000 mg | ORAL_TABLET | Freq: Two times a day (BID) | ORAL | 0 refills | Status: DC

## 2017-07-31 NOTE — Progress Notes (Signed)
Subjective:   Patient ID: Anthony Blanchard, male   DOB: 42 y.o.   MRN: [id number]   HPI Patient presents with some irritation of his skin right stating that he burned it in the shower when the water became too hot.   ROS      Objective:  Physical Exam  Mild diminishment of vascular and neurologic sensation with diminished sharp dull vibratory bilateral area of breakdown dorsal right foot that is superficial with some fluid buildup secondary to the trauma that he experienced.     Assessment:  Probability for a second-degree burn at this area with abscess tissue secondary to trauma     Plan:  H&P condition reviewed and discussed.  I did carefully use sterile instrumentation and I opened up the abscess tissue and I did not  note any type of hearing.  It appears to be strictly fluid secondary to burn.  Flush the area applied Silvadene cream with sterile dressing placed on doxycycline as precautionary measure gave strict instructions of any redness drainage swelling or proximal signs of infection were to occur he is to let us know immediately.

## 2017-08-01 DIAGNOSIS — Z79899 Other long term (current) drug therapy: Secondary | ICD-10-CM | POA: Diagnosis not present

## 2017-08-01 DIAGNOSIS — Z940 Kidney transplant status: Secondary | ICD-10-CM | POA: Diagnosis not present

## 2017-08-02 LAB — BK BLOOD LOG(10)

## 2017-08-02 LAB — BK VIRUS QUANTITATIVE PCR, BLOOD

## 2017-08-03 LAB — CMV DNA, QUANTITATIVE, PCR

## 2017-08-03 LAB — LOG10 CMV QN DNA PL

## 2017-08-04 MED ORDER — METOPROLOL TARTRATE 50 MG TABLET
ORAL_TABLET | ORAL | 99 refills | 0.00000 days | Status: CP
Start: 2017-08-04 — End: 2018-08-09

## 2017-08-04 MED ORDER — METOPROLOL TARTRATE 50 MG TABLET: tablet | 99 refills | 0 days

## 2017-08-04 MED ORDER — METOPROLOL TARTRATE 50 MG TABLET: tablet | 0 refills | 0 days | Status: AC

## 2017-08-04 MED FILL — METOPROLOL TARTRATE/50MG/TABS: METOPROLOL TARTRATE/50MG/TABS | 30 days supply | Qty: 90 | Fill #0

## 2017-08-04 MED FILL — BD NANO PEN NEEDLE/32G/4MM/NDL: BD NANO PEN NEEDLE/32G/4MM/NDL | 25 days supply | Qty: 1 | Fill #4

## 2017-08-18 NOTE — Unmapped (Addendum)
Update 5/7: pt called to update address to [address], [place] [address] (is aunt's address). He will be staying with her. MAS    [facility] Refill Coordination Note    Specialty Medication(s) to be Shipped:   Transplant: Myfortic 180mg  and tacrolimus 1mg   Other medication(s) to be shipped: Omeprazole 20mg  & Metoprolol 50mg      Benjamin Maldonado, DOB: 02-May-1975  Phone: [phone] (home)   Shipping Address: [address]  [address]  [place] [address]    All above HIPAA information was verified with patient.     Completed refill call assessment today to schedule patient's medication shipment from the [facility] [phone]).       Specialty medication(s) and dose(s) confirmed: Regimen is correct and unchanged.   Changes to medications: Cleo reports no changes reported at this time.  Changes to insurance: No  Questions for the pharmacist: No    The patient will receive an FSI print out for each medication shipped and additional FDA Medication Guides as required.  Patient education from [place] or Robet Leu may also be included in the shipment.    DISEASE-SPECIFIC INFORMATION        N/A    ADHERENCE     Medication Adherence    Patient reported X missed doses in the last month:  0         MEDICARE PART B DOCUMENTATION     Myfortic 180mg : Patient has 10 days worth of tablets on hand.  Tacrolimus 1mg : Patient has 10 days worth of capsules on hand.    SHIPPING     Shipping address confirmed in FSI.     Delivery Scheduled: Yes, Expected medication delivery date: 08/26/2017 via UPS or courier.     Shamyia Grandpre Leodis Binet   [facility] Shared [facility] Specialty Technician

## 2017-08-23 MED ORDER — OMEPRAZOLE 20 MG CAPSULE,DELAYED RELEASE
ORAL_CAPSULE | ORAL | 99 refills | 0.00000 days | Status: CP
Start: 2017-08-23 — End: 2018-09-12

## 2017-08-23 MED ORDER — OMEPRAZOLE 20 MG CAPSULE,DELAYED RELEASE: 20 mg | capsule | 99 refills | 0 days

## 2017-08-23 MED ORDER — OMEPRAZOLE 20 MG CAPSULE,DELAYED RELEASE: capsule | 0 refills | 0 days | Status: AC

## 2017-08-25 MED FILL — OMEPRAZOLE/20MG/CPDR: OMEPRAZOLE/20MG/CPDR | 30 days supply | Qty: 30 | Fill #0

## 2017-08-25 MED FILL — TACROLIMUS/1MG/CAPS: TACROLIMUS/1MG/CAPS | 30 days supply | Qty: 360 | Fill #2

## 2017-08-25 MED FILL — MYFORTIC/180MG/TAB: MYFORTIC/180MG/TAB | 30 days supply | Qty: 180 | Fill #1

## 2017-08-26 ENCOUNTER — Telehealth: Payer: Self-pay | Admitting: Podiatry

## 2017-08-26 MED ORDER — DOXYCYCLINE HYCLATE 100 MG PO TABS: 100.0000 mg | ORAL_TABLET | Freq: Two times a day (BID) | ORAL | 0 refills | Status: DC

## 2017-08-26 NOTE — Addendum Note (Signed)
Addended by: Harriett Sine D on: 08/26/2017 01:35 PM   Modules accepted: Orders

## 2017-08-26 NOTE — Telephone Encounter (Signed)
I spoke with pt and he states his ankle started to swell about 2 days ago, and it kinda got him worried. I told pt to continue with the cream to the burn, it the redness, swelling increased, if there was red streaking or cloudy drainage or foul odor or fever to go to the ED, I would get the doxycycline refilled and transferred to schedulers.

## 2017-08-26 NOTE — Telephone Encounter (Signed)
I was calling to speak to a nurse. I'm having problems with my foot. I burned it last month and I've been putting cream on it. The past two days it has swelled up some around my foot and ankle area. Please call me back at [phone]. Thank you.

## 2017-08-29 ENCOUNTER — Other Ambulatory Visit: Payer: Self-pay | Admitting: Podiatry

## 2017-08-29 ENCOUNTER — Encounter: Payer: Self-pay | Admitting: Podiatry

## 2017-08-29 ENCOUNTER — Ambulatory Visit (INDEPENDENT_AMBULATORY_CARE_PROVIDER_SITE_OTHER): Payer: Medicare Other | Admitting: Podiatry

## 2017-08-29 ENCOUNTER — Ambulatory Visit (INDEPENDENT_AMBULATORY_CARE_PROVIDER_SITE_OTHER): Payer: Medicare Other

## 2017-08-29 VITALS — BP 145/76 | HR 69 | Resp 16

## 2017-08-29 DIAGNOSIS — L02619 Cutaneous abscess of unspecified foot: Secondary | ICD-10-CM

## 2017-08-29 DIAGNOSIS — M25571 Pain in right ankle and joints of right foot: Secondary | ICD-10-CM | POA: Diagnosis not present

## 2017-08-29 DIAGNOSIS — L03119 Cellulitis of unspecified part of limb: Principal | ICD-10-CM

## 2017-08-29 DIAGNOSIS — E1149 Type 2 diabetes mellitus with other diabetic neurological complication: Secondary | ICD-10-CM | POA: Diagnosis not present

## 2017-08-29 DIAGNOSIS — E1140 Type 2 diabetes mellitus with diabetic neuropathy, unspecified: Secondary | ICD-10-CM

## 2017-08-29 DIAGNOSIS — I2510 Atherosclerotic heart disease of native coronary artery without angina pectoris: Secondary | ICD-10-CM | POA: Insufficient documentation

## 2017-08-29 MED FILL — METOPROLOL TARTRATE/50MG/TABS: METOPROLOL TARTRATE/50MG/TABS | 30 days supply | Qty: 90 | Fill #1

## 2017-08-31 NOTE — Progress Notes (Signed)
Subjective:   Patient ID: Anthony Blanchard, male   DOB: 42 y.o.   MRN: [id number]   HPI Patient states a couple weeks ago he noticed some drainage in the right foot and he is just concerned because he has diabetes that is under excellent control with A1c 6.5 but he did burn the top of his foot   ROS      Objective:  Physical Exam  Neurovascular status unchanged with patient's right foot showing a significant area of burn tissue that is localized with no drainage currently noted or no proximal edema erythema it appears to be gradually healing but will probably take several more months to heal completely     Assessment:  Traumatized dorsal tissue right with second-degree burn that is healing     Plan:  Continue Silvadene continue being careful to keep it clean and this should heal uneventfully but will be seen back as needed

## 2017-09-05 DIAGNOSIS — Z940 Kidney transplant status: Secondary | ICD-10-CM | POA: Diagnosis not present

## 2017-09-05 DIAGNOSIS — I129 Hypertensive chronic kidney disease with stage 1 through stage 4 chronic kidney disease, or unspecified chronic kidney disease: Secondary | ICD-10-CM | POA: Diagnosis not present

## 2017-09-16 NOTE — Telephone Encounter (Signed)
Received this notice from [employer]: "This patient had an appointment scheduled for 5/30 and no showed his appointment. I have called to see about getting him to reschedule he did not answer and I LVM with him. I have voided his sales order for the time being."  I called pt to discuss. No answer, left a message asking him to call me back.

## 2017-09-23 ENCOUNTER — Encounter: Admit: 2017-09-23 | Discharge: 2017-09-23 | Payer: MEDICARE

## 2017-09-23 ENCOUNTER — Ambulatory Visit: Admit: 2017-09-23 | Discharge: 2017-09-23 | Payer: MEDICARE

## 2017-09-23 ENCOUNTER — Encounter: Admit: 2017-09-23 | Discharge: 2017-09-23 | Payer: MEDICARE | Attending: Nephrology | Primary: Nephrology

## 2017-09-23 DIAGNOSIS — Z79899 Other long term (current) drug therapy: Secondary | ICD-10-CM

## 2017-09-23 DIAGNOSIS — Z940 Kidney transplant status: Principal | ICD-10-CM

## 2017-09-23 DIAGNOSIS — Z48298 Encounter for aftercare following other organ transplant: Secondary | ICD-10-CM

## 2017-09-23 DIAGNOSIS — E1142 Type 2 diabetes mellitus with diabetic polyneuropathy: Secondary | ICD-10-CM | POA: Diagnosis not present

## 2017-09-23 DIAGNOSIS — R0901 Asphyxia: Secondary | ICD-10-CM | POA: Diagnosis not present

## 2017-09-23 DIAGNOSIS — I120 Hypertensive chronic kidney disease with stage 5 chronic kidney disease or end stage renal disease: Secondary | ICD-10-CM | POA: Diagnosis not present

## 2017-09-23 DIAGNOSIS — E11319 Type 2 diabetes mellitus with unspecified diabetic retinopathy without macular edema: Secondary | ICD-10-CM | POA: Diagnosis not present

## 2017-09-23 DIAGNOSIS — E1122 Type 2 diabetes mellitus with diabetic chronic kidney disease: Secondary | ICD-10-CM | POA: Diagnosis not present

## 2017-09-23 DIAGNOSIS — E785 Hyperlipidemia, unspecified: Secondary | ICD-10-CM | POA: Diagnosis not present

## 2017-09-23 DIAGNOSIS — Z992 Dependence on renal dialysis: Secondary | ICD-10-CM | POA: Diagnosis not present

## 2017-09-23 DIAGNOSIS — I2510 Atherosclerotic heart disease of native coronary artery without angina pectoris: Secondary | ICD-10-CM | POA: Diagnosis not present

## 2017-09-23 DIAGNOSIS — E875 Hyperkalemia: Secondary | ICD-10-CM | POA: Diagnosis not present

## 2017-09-23 DIAGNOSIS — N271 Small kidney, bilateral: Secondary | ICD-10-CM | POA: Diagnosis not present

## 2017-09-23 DIAGNOSIS — Z6830 Body mass index (BMI) 30.0-30.9, adult: Secondary | ICD-10-CM | POA: Diagnosis not present

## 2017-09-23 DIAGNOSIS — N186 End stage renal disease: Secondary | ICD-10-CM | POA: Diagnosis not present

## 2017-09-23 LAB — CMV DNA, QUANTITATIVE, PCR: CMV VIRAL LD: NOT DETECTED

## 2017-09-23 LAB — COMPREHENSIVE METABOLIC PANEL
ALKALINE PHOSPHATASE: 72 U/L (ref 38–126)
ALT (SGPT): 23 U/L (ref 19–72)
ANION GAP: 11 mmol/L (ref 9–15)
AST (SGOT): 20 U/L (ref 19–55)
BILIRUBIN TOTAL: 0.4 mg/dL (ref 0.0–1.2)
BLOOD UREA NITROGEN: 25 mg/dL — ABNORMAL HIGH (ref 7–21)
BUN / CREAT RATIO: 17
CALCIUM: 10.3 mg/dL — ABNORMAL HIGH (ref 8.5–10.2)
CHLORIDE: 105 mmol/L (ref 98–107)
CO2: 27 mmol/L (ref 22.0–30.0)
CREATININE: 1.47 mg/dL — ABNORMAL HIGH (ref 0.70–1.30)
EGFR MDRD AF AMER: >=60 mL/min/{1.73_m2} (ref >=60)
EGFR MDRD NON AF AMER: 53 mL/min/{1.73_m2} — ABNORMAL LOW (ref >=60)
GLUCOSE RANDOM: 87 mg/dL (ref 65–99)
POTASSIUM: 4.4 mmol/L (ref 3.5–5.0)
PROTEIN TOTAL: 7.4 g/dL (ref 6.5–8.3)
SODIUM: 143 mmol/L (ref 135–145)

## 2017-09-23 LAB — LIPID PANEL
CHOLESTEROL/HDL RATIO SCREEN: 3.2 (ref <5.0)
HDL CHOLESTEROL: 54 mg/dL (ref 40–59)
LDL CHOLESTEROL CALCULATED: 96 mg/dL (ref 60–99)
NON-HDL CHOLESTEROL: 117 mg/dL
TRIGLYCERIDES: 105 mg/dL (ref 1–149)
VLDL CHOLESTEROL CAL: 21 mg/dL (ref 11–50)

## 2017-09-23 LAB — CBC W/ AUTO DIFF
BASOPHILS ABSOLUTE COUNT: 0 10*9/L (ref 0.0–0.1)
BASOPHILS RELATIVE PERCENT: 0.3 %
EOSINOPHILS ABSOLUTE COUNT: 0.1 10*9/L (ref 0.0–0.4)
EOSINOPHILS RELATIVE PERCENT: 0.9 %
HEMATOCRIT: 45 % (ref 41.0–53.0)
LARGE UNSTAINED CELLS: 2 % (ref 0–4)
LYMPHOCYTES ABSOLUTE COUNT: 0.9 10*9/L — ABNORMAL LOW (ref 1.5–5.0)
LYMPHOCYTES RELATIVE PERCENT: 11.5 %
MEAN CORPUSCULAR HEMOGLOBIN CONC: 28.9 g/dL — ABNORMAL LOW (ref 31.0–37.0)
MEAN CORPUSCULAR HEMOGLOBIN: 26.5 pg (ref 26.0–34.0)
MEAN PLATELET VOLUME: 10.1 fL — ABNORMAL HIGH (ref 7.0–10.0)
MONOCYTES ABSOLUTE COUNT: 0.4 10*9/L (ref 0.2–0.8)
MONOCYTES RELATIVE PERCENT: 4.6 %
NEUTROPHILS ABSOLUTE COUNT: 6.6 10*9/L (ref 2.0–7.5)
NEUTROPHILS RELATIVE PERCENT: 81.1 %
PLATELET COUNT: 184 10*9/L (ref 150–440)
RED BLOOD CELL COUNT: 4.9 10*12/L (ref 4.50–5.90)
RED CELL DISTRIBUTION WIDTH: 15 % (ref 12.0–15.0)
WBC ADJUSTED: 8.2 10*9/L (ref 4.5–11.0)

## 2017-09-23 LAB — ESTIMATED AVERAGE GLUCOSE: Estimated average glucose:MCnc:Pt:Bld:Qn:Estimated from glycated hemoglobin: 154

## 2017-09-23 LAB — URINALYSIS
BACTERIA: NONE SEEN /HPF
BILIRUBIN UA: NEGATIVE
KETONES UA: NEGATIVE
LEUKOCYTE ESTERASE UA: NEGATIVE
NITRITE UA: NEGATIVE
RBC UA: <1 /HPF (ref <=3)
SPECIFIC GRAVITY UA: 1.011 (ref 1.003–1.030)
SQUAMOUS EPITHELIAL: <1 /HPF (ref 0–5)
UROBILINOGEN UA: 0.2
WBC UA: <1 /HPF (ref <=2)

## 2017-09-23 LAB — UROBILINOGEN UA: Lab: 0.2

## 2017-09-23 LAB — PARATHYROID HORMONE INTACT: Parathyrin.intact:MCnc:Pt:Ser/Plas:Qn:: 215.4 — ABNORMAL HIGH

## 2017-09-23 LAB — PROTEIN / CREATININE RATIO, URINE: CREATININE, URINE: 72.3 mg/dL

## 2017-09-23 LAB — MAGNESIUM: Magnesium:MCnc:Pt:Ser/Plas:Qn:: 1.7

## 2017-09-23 LAB — PARATHYROID HOMONE (PTH): PARATHYROID HORMONE INTACT: 215.4 pg/mL — ABNORMAL HIGH (ref 12.0–72.0)

## 2017-09-23 LAB — BURR CELLS

## 2017-09-23 LAB — RED BLOOD CELL COUNT: Lab: 4.9

## 2017-09-23 LAB — VITAMIN D, TOTAL (25OH): Lab: 20.4

## 2017-09-23 LAB — PROTEIN URINE: Protein:MCnc:Pt:Urine:Qn:: 22

## 2017-09-23 LAB — CMV VIRAL LD: Lab: NOT DETECTED

## 2017-09-23 LAB — TACROLIMUS, TROUGH: Lab: 7.6

## 2017-09-23 LAB — VITAMIN D 25 HYDROXY: VITAMIN D, TOTAL (25OH): 20.4 ng/mL (ref 20.0–80.0)

## 2017-09-23 LAB — SODIUM: Sodium:SCnc:Pt:Ser/Plas:Qn:: 143

## 2017-09-23 LAB — PHOSPHORUS: Phosphate:MCnc:Pt:Ser/Plas:Qn:: 3.8

## 2017-09-23 LAB — TRIGLYCERIDES: Triglyceride:MCnc:Pt:Ser/Plas:Qn:: 105

## 2017-09-23 MED ORDER — INSULIN ASPART (U-100) 100 UNIT/ML (3 ML) SUBCUTANEOUS PEN
3 refills | 0 days | Status: CP
Start: 2017-09-23 — End: 2017-10-11

## 2017-09-23 NOTE — Unmapped (Signed)
Transplant Nephrology Clinic Visit    History of Present Illness    Benjamin Maldonado is a 42 y.o. 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  Baseline creatinine recently has been 1.3-1.8 mg/dL.      He presents today without complaints. He denies nausea, vomiting, diarrhea, headaches, hand tremors, chest pain, sob, dysuria, and pain over the allograft site. He states he burned his right foot a couple of weeks ago while showering. It has since healed. He denies known history of lower extremity senory loss from neuropathy. Home blood pressures are typically in the 130's/80's. Home blood sugars have been controlled, with occasional nocturnal hypoglycemia. He was recently reduced to Lantus 8 units from 12 units.  He continues to follow with Dr. Signe Colt at [facility] for his nephrology care.      Kidney Transplant History  ??  1. 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 Deceased donor kidney transplant 11/08/2014.  DCD kidney donor with KDPI 43%, CMV D-R-, EBV D+R+  3. Donor with asphyxiation and serratia isolated on donor tracheal aspirate and donor blood cultures with S. Epidermidis bacteremia. Patient treated with Vancomycin for 2 weeks.  4. Delayed graft function with dialysis due to hyperkalemia  5. Induction agent = campath, solumedrol with rapid steroid discontinuation  6. Maintenance immunosuppression = tacrolimus, mycophenolate sodium  7. Stent removed 12/11/2014.  8. Kidney biopsy 11/12/2015 due to acute allograft dysfunction with focal mild ATN and no other pathology.  9. 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      ??  Other Medical History   ??  1. Type 2 diabetes mellitus, onset 1991.   2. Hypertension of several years duration.  3. Coronary artery disease, s/p PCI to the RCA in 2009, s/p DES to LAD 04/30/2011,   4. Left ventricular hypertrophy with diastolic dysfunction noted on echocardiogram 09/2014.  5. Hyperlipidemia 6. Diabetic retinopathy with right eye blindness  7. Diabetic peripheral neuropathy  8. Frozen left shoulder  9. Burn right foot 08/2017  ??  Review of Systems    Otherwise as per HPI, all other systems reviewed and are negative.    Medications  Current Outpatient Medications   Medication Sig Dispense Refill   ??? acetaminophen (TYLENOL) 325 MG tablet Take 2 tablets (650 mg total) by mouth every six (6) hours as needed for pain. 100 tablet 2   ??? aspirin (ADULT LOW DOSE ASPIRIN) 81 MG tablet Take 1 tablet (81 mg total) by mouth daily. 30 tablet 11   ??? atorvastatin (LIPITOR) 20 MG tablet Take 1 tablet (20 mg total) by mouth daily. 90 tablet 3   ??? BD ULTRA-FINE NANO PEN NEEDLE 32 gauge x 5/32 Ndle USE TO ADMINISTER INSULIN 4 TIMES DAILY 1 each PRN   ??? blood sugar diagnostic Strp by Other route Four (4) times a day (before meals and nightly). One Touch 100 strip 5   ??? blood-glucose meter (GLUCOSE MONITORING KIT) kit Use as instructed 1 each 0   ??? GLUC.METER,DIS.P-LOADED STRIPS (GLUCOSE METER, DISP & STRIPS MISC) Frequency:PHARMDIR   Dosage:0.0     Instructions:  Note:250.00, life long Dose: 1     ??? insulin ASPART (NOVOLOG FLEXPEN) 100 unit/mL (3 mL) injection pen Take 4 units with breakfast, 8u w/lunch, 6u w/supper and sliding scale 15 mL 3   ??? insulin glargine (LANTUS) 100 unit/mL (3 mL) injection pen Inject 0.14 mL (14 Units total) under the skin nightly. 2 pen  11   ??? metoprolol tartrate (LOPRESSOR) 50 MG tablet TAKE 1 AND A HALF TABLETS (75 MG) BY MOUTH TWICE DAILY 90 tablet PRN   ??? mycophenolate (MYFORTIC) 180 MG EC tablet Take 3 tablets (540 mg total) by mouth Two (2) times a day. 180 tablet 11   ??? omeprazole (PRILOSEC) 20 MG capsule TAKE 1 CAPSULE BY MOUTH ONCE DAILY 30 capsule PRN   ??? tacrolimus (PROGRAF) 1 MG capsule Take 6 capsules (6 mg total) by mouth every twelve (12) hours. 360 capsule 11     No current facility-administered medications for this visit.        Physical Exam  BP 138/78 (BP Site: L Arm, BP Position: Sitting, BP Cuff Size: Medium)  - Pulse 60  - Temp 36.1 ??C (96.9 ??F)  - Wt 94.2 kg (207 lb 11.2 oz)  - BMI 30.66 kg/m??   General: no acute distress  HEENT: Anicteric. He is blind in the right eye with corneal opacification.  Neck: neck supple, no cervical lymphadenopathy appreciated  CV: RRR, no murmur, no gallops, no rubs appreciated  Lungs: clear to auscultation bilaterally  Abdomen: soft, non tender, graft site non-tender  Extremities: no edema, RUE AVF without bruit or thrill. Healed apparent blister/burn on dorsam of right foot   Musculoskeletal: no visible deformity. No limitation in ROM of left shoulder.  Pulses: intact distally throughout  Neurologic: awake, alert, and oriented x3    Laboratory Data  Recent Results (from the past 170 hour(s))   Lipid Panel    Collection Time: 09/23/17  7:35 AM   Result Value Ref Range    Triglycerides 105 1 - 149 mg/dL    Cholesterol 161 096 - 199 mg/dL    HDL 54 40 - 59 mg/dL    LDL Calculated 96 60 - 99 mg/dL    VLDL Cholesterol Cal 21 11 - 50 mg/dL    Chol/HDL Ratio 3.2 <0.4    Non-HDL Cholesterol 117 mg/dL    FASTING Yes    Comprehensive Metabolic Panel    Collection Time: 09/23/17  7:35 AM   Result Value Ref Range    Sodium 143 135 - 145 mmol/L    Potassium 4.4 3.5 - 5.0 mmol/L    Chloride 105 98 - 107 mmol/L    CO2 27.0 22.0 - 30.0 mmol/L    BUN 25 (H) 7 - 21 mg/dL    Creatinine 5.40 (H) 0.70 - 1.30 mg/dL    BUN/Creatinine Ratio 17     EGFR MDRD Non Af Amer 53 (L) >=60 mL/min/1.14m2    EGFR MDRD Af Amer >=60 >=60 mL/min/1.31m2    Anion Gap 11 9 - 15 mmol/L    Glucose 87 65 - 99 mg/dL    Calcium 98.1 (H) 8.5 - 10.2 mg/dL    Albumin 4.4 3.5 - 5.0 g/dL    Total Protein 7.4 6.5 - 8.3 g/dL    Total Bilirubin 0.4 0.0 - 1.2 mg/dL    AST 20 19 - 55 U/L    ALT 23 19 - 72 U/L    Alkaline Phosphatase 72 38 - 126 U/L   Phosphorus Level    Collection Time: 09/23/17  7:35 AM   Result Value Ref Range    Phosphorus 3.8 2.9 - 4.7 mg/dL   Magnesium Level    Collection Time: 09/23/17  7:35 AM   Result Value Ref Range    Magnesium 1.7 1.6 - 2.2 mg/dL   CBC w/ Differential    Collection  Time: 09/23/17  7:35 AM   Result Value Ref Range    WBC 8.2 4.5 - 11.0 10*9/L    RBC 4.90 4.50 - 5.90 10*12/L    HGB 13.0 (L) 13.5 - 17.5 g/dL    HCT 16.1 09.6 - 04.5 %    MCV 91.7 80.0 - 100.0 fL    MCH 26.5 26.0 - 34.0 pg    MCHC 28.9 (L) 31.0 - 37.0 g/dL    RDW 40.9 81.1 - 91.4 %    MPV 10.1 (H) 7.0 - 10.0 fL    Platelet 184 150 - 440 10*9/L    Neutrophils % 81.1 %    Lymphocytes % 11.5 %    Monocytes % 4.6 %    Eosinophils % 0.9 %    Basophils % 0.3 %    Absolute Neutrophils 6.6 2.0 - 7.5 10*9/L    Absolute Lymphocytes 0.9 (L) 1.5 - 5.0 10*9/L    Absolute Monocytes 0.4 0.2 - 0.8 10*9/L    Absolute Eosinophils 0.1 0.0 - 0.4 10*9/L    Absolute Basophils 0.0 0.0 - 0.1 10*9/L    Large Unstained Cells 2 0 - 4 %    Hypochromasia Marked (A) Not Present       Assessment    Benjamin Maldonado is a 42 y.o. 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 Active medical issues include:    1. Status post kidney transplant. Serum creatinine is 1.47 mg/dL and within the recent baseline range of 1.2-1.8 mg/dL. He has no history of rejection or proteinuria or donor specific antibodies.    2. Immunosuppression management. We are targeting tacrolimus levels of approximately 5-8. Today's level of 7.6 is within this target range. His current dose of tacrolimus will be continued as will Myfortic 540 mg twice daily.      3. 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  BP is relatively well controlled. Renal US from today no longer mentions anastomosis renal artery stenosis. I do not feel further imaging is necessary in the absence of HTN or allograft dysfunction. The current antihypertensive regiment will be continued.    4. Diabetes mellitus. Patient was recently decreased to Lantus 8 units due to nocturnal hypoglycemia. He sees Dr. Wylene Simmer for management of diabetes. A1C today is 7.0.     5. Hyperlipidemia. He is on Lipitor without myopathy symptoms.    6. Coronary artery disease. He will continue beta blocker therapy and aspirin. He has no recent symptoms suggestive of cardiac ischemia.     7. Immunizations. Prevnar 13 (03/23/17). Flu vaccine (12/2016).    8. Follow-up. He will be seen again in 6 months. We continue shared follow-up with Dr. Signe Colt in [place] and he will see her in 3 months.      Scribe's Attestation: Jackey Loge, MD obtained and performed the history, physical exam and medical decision making elements that were entered into the chart.  Signed by Delaney Meigs, Scribe, on September 23, 2017 8:50 AM    ----------------------------------------------------------------------------------------------------------------------  September 23, 2017 2:35 PM. Documentation assistance provided by the Scribe. I was present during the time the encounter was recorded. The information recorded by the Scribe was done at my direction and has been reviewed and validated by me.  ----------------------------------------------------------------------------------------------------------------------

## 2017-09-23 NOTE — Unmapped (Signed)
Met with pt in nephrology clinic today.  He is feeling well and denies fever, cough, N/V/D, swelling or dysuria.  He states he burned his right foot a couple of weeks ago while showering (water was too hot).  He didn't realize it until the next day when he saw a blister.  He saw a foot doctor who told him it was a second degree burn.  It has since healed without any problem.  He understands that he needs to look at his feet every day for blisters or calluses.  He states he sees Dr. Signe Colt in [place] regularly.  He held prograf prior to blood draw today, last dose was at 2200 last noc.

## 2017-09-26 NOTE — Unmapped (Addendum)
[facility] Pharm[facility] Refill Coordination Note    Specialty Medication(s) to be Shipped:   Transplant: Myfortic 180mg  and tacrolimus 1mg      The tacrolimus manufactured by [employer] is currently on backorder.  The [facility] is out of stock and this patient has been taking this manufacturer.  Beginning with this scheduled dispensation, the patient will receive tacrolimus manufactured by [employer].  The patient will stay on this new manufacturer going forward.      The patient has been counseled on the change of manufacturer and possible need for more frequent monitoring.  Transplant coordinator/pharmacist in clinic has also been notified.      Benjamin Maldonado, DOB: 1975/06/22  Phone: [phone] (home)   Shipping Address: [address]  [address]  [place] [address]    All above HIPAA information was verified with patient.     Completed refill call assessment today to schedule patient's medication shipment from the [facility] [phone]).       Specialty medication(s) and dose(s) confirmed: Regimen is correct and unchanged.   Changes to medications: Benjamin Maldonado reports no changes reported at this time.  Changes to insurance: No  Questions for the pharmacist: No    The patient will receive an FSI print out for each medication shipped and additional FDA Medication Guides as required.  Patient education from [employer] or Robet Leu may also be included in the shipment.    DISEASE-SPECIFIC INFORMATION        N/A    ADHERENCE     Medication Adherence    Patient reported X missed doses in the last month:  0         MEDICARE PART B DOCUMENTATION     Myfortic 180mg : Patient has 5 days worth of tablets on hand.  Tacrolimus 1mg : Patient has 5 days worth of capsules on hand.    SHIPPING     Shipping address confirmed in FSI.     Delivery Scheduled: Yes, Expected medication delivery date: 09/30/2017 via UPS or courier.     Benjamin Maldonado   [facility] Shared [facility] Specialty Technician

## 2017-09-27 LAB — HLA DS POST TRANSPLANT
ANTI-DONOR DRW #1 MFI: 587 MFI
ANTI-DONOR HLA-A #1 MFI: 48 MFI
ANTI-DONOR HLA-A #2 MFI: 31 MFI
ANTI-DONOR HLA-C #1 MFI: 0 MFI
ANTI-DONOR HLA-C #2 MFI: 32 MFI
ANTI-DONOR HLA-DP AG #1 MFI: 182 MFI
ANTI-DONOR HLA-DQB #1 MFI: 291 MFI
ANTI-DONOR HLA-DQB #2 MFI: 97 MFI
ANTI-DONOR HLA-DR #1 MFI: 176 MFI
ANTI-DONOR HLA-DR #2 MFI: 141 MFI

## 2017-09-27 LAB — VITAMIN D 1,25-DIHYDROXY: 1,25-Dihydroxyvitamin D:MCnc:Pt:Ser/Plas:Qn:: 38

## 2017-09-27 LAB — FSAB CLASS 1 ANTIBODY SPECIFICITY

## 2017-09-27 LAB — BK BLOOD COMMENT: Lab: 0

## 2017-09-27 LAB — BK VIRUS QUANTITATIVE PCR, BLOOD: BK BLOOD RESULT: NOT DETECTED

## 2017-09-27 LAB — EBV VIRAL LOAD RESULT: Lab: NOT DETECTED

## 2017-09-27 LAB — ANTI-DONOR HLA-A #1 MFI: Lab: 48

## 2017-09-27 LAB — HLA CL2 AB COMMENT: Lab: 0

## 2017-09-27 LAB — HLA CLASS 1 ANTIBODY RESULT: Lab: NEGATIVE

## 2017-09-27 LAB — FSAB CLASS 2 ANTIBODY SPECIFICITY

## 2017-09-29 ENCOUNTER — Ambulatory Visit: Payer: Medicare Other | Admitting: Neurology

## 2017-09-29 MED FILL — TACROLIMUS/1MG/CAPS: TACROLIMUS/1MG/CAPS | 30 days supply | Qty: 360 | Fill #3

## 2017-09-29 MED FILL — MYFORTIC/180MG/TAB: MYFORTIC/180MG/TAB | 30 days supply | Qty: 180 | Fill #2

## 2017-10-10 MED FILL — LANTUS SOLOSTAR (BOX)/100UNIT/ML/SOLN: LANTUS SOLOSTAR (BOX)/100UNIT/ML/SOLN | 30 days supply | Qty: 1 | Fill #3

## 2017-10-11 MED ORDER — INSULIN ASPART (U-100) 100 UNIT/ML (3 ML) SUBCUTANEOUS PEN
3 refills | 0.00000 days | Status: CP
Start: 2017-10-11 — End: 2018-06-07
  Filled 2018-01-02: qty 15, 30d supply, fill #0

## 2017-10-11 MED ORDER — INSULIN ASPART (U-100) 100 UNIT/ML (3 ML) SUBCUTANEOUS PEN: mL | 3 refills | 0 days | Status: AC

## 2017-10-11 MED FILL — NOVOLOG FLEXPEN(BOX)/100UNIT/ML/INJ: NOVOLOG FLEXPEN(BOX)/100UNIT/ML/INJ | 83 days supply | Qty: 1 | Fill #0

## 2017-10-11 NOTE — Unmapped (Signed)
This note was routed to me, sending it to kidney transplant coordinator.

## 2017-10-12 MED FILL — OMEPRAZOLE/20MG/CPDR: OMEPRAZOLE/20MG/CPDR | 30 days supply | Qty: 30 | Fill #1

## 2017-10-13 DIAGNOSIS — H401132 Primary open-angle glaucoma, bilateral, moderate stage: Secondary | ICD-10-CM | POA: Diagnosis not present

## 2017-10-13 MED FILL — BD NANO PEN NEEDLE/32G/4MM/NDL: BD NANO PEN NEEDLE/32G/4MM/NDL | 25 days supply | Qty: 1 | Fill #5

## 2017-10-21 NOTE — Unmapped (Signed)
[facility] Pharm[facility] Refill Coordination Note    Specialty Medication(s) to be Shipped:   Transplant: Myfortic 180mg  and tacrolimus 1mg     Other medication(s) to be shipped:       Benjamin Maldonado, DOB: May 23, 1975  Phone: [phone] (home)   Shipping Address: [address]  [address]  [place] [address]    All above HIPAA information was verified with patient.     Completed refill call assessment today to schedule patient's medication shipment from the [facility] [phone]).       Specialty medication(s) and dose(s) confirmed: Regimen is correct and unchanged.   Changes to medications: Benjamin Maldonado reports no changes reported at this time.  Changes to insurance: No  Questions for the pharmacist: No    The patient will receive an FSI print out for each medication shipped and additional FDA Medication Guides as required.  Patient education from [place] or Robet Leu may also be included in the shipment.    DISEASE-SPECIFIC INFORMATION        N/A    ADHERENCE              MEDICARE PART B DOCUMENTATION     Myfortic 180mg : Patient has 48 tablets on hand.  Tacrolimus 1mg : Patient has 96 capsules on hand.    SHIPPING     Shipping address confirmed in FSI.     Delivery Scheduled: Yes, Expected medication delivery date: 071119 via UPS or courier.     Antonietta Barcelona   [facility] Shared [facility] Specialty Technician

## 2017-10-24 ENCOUNTER — Ambulatory Visit: Payer: Self-pay | Admitting: Neurology

## 2017-10-26 MED FILL — MYFORTIC/180MG/TAB: MYFORTIC/180MG/TAB | 30 days supply | Qty: 180 | Fill #3

## 2017-10-26 MED FILL — TACROLIMUS/1MG/CAPS: TACROLIMUS/1MG/CAPS | 30 days supply | Qty: 360 | Fill #4

## 2017-11-16 NOTE — Unmapped (Signed)
[facility] Pharm[facility] Refill and Clinical Coordination Note  Medication(s): myfortic, prograf  Pt has been on generic tac in past, however due to nationwide back order, clinic ok'ed switch to brand. Patient is ok with and aware of change to brand prograf for next fill at same price as generic ($0 on both per Part B). Patient is aware that dosing will not change. Will also message clinic today.     Benjamin Maldonado, DOB: 1976/01/26  Phone: [phone] (home) , Alternate phone contact: N/A  Shipping address: [address] [place], [place] [address]  Phone or address changes today?: Yes- adjusted in FSI and here  All above HIPAA information verified.  Insurance changes? No    Completed refill and clinical call assessment today to schedule patient's medication shipment from the [facility] [phone]).      MEDICATION RECONCILIATION    Confirmed the medication and dosage are correct and have not changed: Yes, regimen is correct and unchanged.    Were there any changes to your medication(s) in the past month:  No, there are no changes reported at this time.    ADHERENCE    Is this medicine transplant or covered by Medicare Part B? Yes.    Tacrolimus 1 mg   Quantity filled last month: 360   # of tablets left on hand: 12 days left  Myfortic 180 mg   Quantity filled last month: 180   # of tablets left on hand: 12 days left    Did you miss any doses in the past 4 weeks? Yes.  Benjamin Maldonado reports missing 2 doses of myfortic days of medication therapy in the last 4 weeks.  Benjamin Maldonado reports fell out of his pillbox as the cause of their non-adherance.  Adherence counseling provided? Yes, discussed the following ways to help with adherence: making sure all meds are in pillbox appropriately, filling it each week, etc.     SIDE EFFECT MANAGEMENT    Are you tolerating your medication?:  Benjamin Maldonado reports tolerating the medication.  Side effect management discussed: None      Therapy is appropriate and should be continued.    Evidence of clinical benefit: See Epic note from 09/23/17      FINANCIAL/SHIPPING    Delivery Scheduled: Yes, Expected medication delivery date: 11/23/17 via ups   Additional medications refilled: also sending lantus and pen needles for Fri 8/2 delivery. Patient states forgot larger supply at old address and will need both insulins by Friday. Novolog is refill too soon until 9/2 per ins. Pt states he will see if family can send him novolog to new address before running out. I let him know that if they canNOT send him the novolog prior to running out of med, to call us back so we can see if ins will allow override.    The patient will receive an FSI print out for each medication shipped and additional FDA Medication Guides as required.  Patient education from [employer] or Benjamin Maldonado may also be included in the shipment.    Benjamin Maldonado did not have any additional questions at this time.    Delivery address validated in FSI scheduling system: Yes, address listed above is correct.      We will follow up with patient monthly for standard refill processing and delivery.      Thank you,  [name]   [facility] Shared [facility] Specialty Pharmacist

## 2017-11-17 MED FILL — LANTUS SOLOSTAR (BOX)/100UNIT/ML/SOLN: LANTUS SOLOSTAR (BOX)/100UNIT/ML/SOLN | 30 days supply | Qty: 1 | Fill #4

## 2017-11-17 MED FILL — BD NANO PEN NEEDLE/32G/4MM/NDL: BD NANO PEN NEEDLE/32G/4MM/NDL | 25 days supply | Qty: 1 | Fill #6

## 2017-11-18 NOTE — Unmapped (Signed)
medsi  Current Outpatient Medications on File Prior to Visit   Medication Sig   ??? acetaminophen (TYLENOL) 325 MG tablet Take 2 tablets (650 mg total) by mouth every six (6) hours as needed for pain.   ??? aspirin (ADULT LOW DOSE ASPIRIN) 81 MG tablet Take 1 tablet (81 mg total) by mouth daily.   ??? atorvastatin (LIPITOR) 20 MG tablet Take 1 tablet (20 mg total) by mouth daily.   ??? BD ULTRA-FINE NANO PEN NEEDLE 32 gauge x 5/32 Ndle USE TO ADMINISTER INSULIN 4 TIMES DAILY   ??? blood sugar diagnostic Strp by Other route Four (4) times a day (before meals and nightly). One Touch   ??? blood-glucose meter (GLUCOSE MONITORING KIT) kit Use as instructed   ??? GLUC.METER,DIS.P-LOADED STRIPS (GLUCOSE METER, DISP & STRIPS MISC) Frequency:PHARMDIR   Dosage:0.0     Instructions:  Note:250.00, life long Dose: 1   ??? insulin ASPART (NOVOLOG FLEXPEN) 100 unit/mL (3 mL) injection pen Take 4 units with breakfast, 8u w/lunch, 6u w/supper and sliding scale   ??? insulin glargine (LANTUS) 100 unit/mL (3 mL) injection pen Inject 0.14 mL (14 Units total) under the skin nightly.   ??? metoprolol tartrate (LOPRESSOR) 50 MG tablet TAKE 1 AND A HALF TABLETS (75 MG) BY MOUTH TWICE DAILY   ??? mycophenolate (MYFORTIC) 180 MG EC tablet Take 3 tablets (540 mg total) by mouth Two (2) times a day.   ??? omeprazole (PRILOSEC) 20 MG capsule TAKE 1 CAPSULE BY MOUTH ONCE DAILY   ??? tacrolimus (PROGRAF) 1 MG capsule Take 6 capsules (6 mg total) by mouth every twelve (12) hours.     No current facility-administered medications on file prior to visit.

## 2017-11-22 MED FILL — MYFORTIC/180MG/TAB: MYFORTIC/180MG/TAB | 30 days supply | Qty: 180 | Fill #4

## 2017-11-22 MED FILL — PROGRAF/1MG/CAP: PROGRAF/1MG/CAP | 30 days supply | Qty: 360 | Fill #5

## 2017-11-24 MED FILL — METOPROLOL TARTRATE/50MG/TABS: METOPROLOL TARTRATE/50MG/TABS | 30 days supply | Qty: 90 | Fill #2

## 2017-11-24 MED FILL — OMEPRAZOLE/20MG/CPDR: OMEPRAZOLE/20MG/CPDR | 30 days supply | Qty: 30 | Fill #2

## 2017-11-24 NOTE — Unmapped (Signed)
[facility]) medication reconciliation is completed.

## 2017-12-16 DIAGNOSIS — I129 Hypertensive chronic kidney disease with stage 1 through stage 4 chronic kidney disease, or unspecified chronic kidney disease: Secondary | ICD-10-CM | POA: Diagnosis not present

## 2017-12-16 DIAGNOSIS — E663 Overweight: Secondary | ICD-10-CM | POA: Diagnosis not present

## 2017-12-16 DIAGNOSIS — E1129 Type 2 diabetes mellitus with other diabetic kidney complication: Secondary | ICD-10-CM | POA: Diagnosis not present

## 2017-12-16 DIAGNOSIS — D899 Disorder involving the immune mechanism, unspecified: Secondary | ICD-10-CM | POA: Diagnosis not present

## 2017-12-16 DIAGNOSIS — Z940 Kidney transplant status: Secondary | ICD-10-CM | POA: Diagnosis not present

## 2017-12-20 DIAGNOSIS — K219 Gastro-esophageal reflux disease without esophagitis: Secondary | ICD-10-CM | POA: Diagnosis not present

## 2017-12-20 DIAGNOSIS — E1129 Type 2 diabetes mellitus with other diabetic kidney complication: Secondary | ICD-10-CM | POA: Diagnosis not present

## 2017-12-20 DIAGNOSIS — Z6829 Body mass index (BMI) 29.0-29.9, adult: Secondary | ICD-10-CM | POA: Diagnosis not present

## 2017-12-20 DIAGNOSIS — I10 Essential (primary) hypertension: Secondary | ICD-10-CM | POA: Diagnosis not present

## 2017-12-23 NOTE — Unmapped (Signed)
Called patient to see if he could get labs since he hasn't had any since June.  Patient reports he saw his local nephrologist last Friday and had labs. States he is doing well    [facility] in [place] and spoke to [facility] who will fax over clinic and labs.

## 2017-12-23 NOTE — Unmapped (Signed)
[facility] Refill Coordination Note    Specialty Medication(s) to be Shipped:   Transplant: Myfortic 180mg  and Prograf 1mg   Other medication(s) to be shipped: Omeprazole 20mg , Metorprolol 50mg , Lantus, Nvolog Flexpen & Needles     Benjamin Maldonado, DOB: 02/20/1976  Phone: [phone] (home)   Shipping Address: [address]  [address]  [place] [address]    All above HIPAA information was verified with patient.     Completed refill call assessment today to schedule patient's medication shipment from the [facility] [phone]).       Specialty medication(s) and dose(s) confirmed: Regimen is correct and unchanged.   Changes to medications: Benjamin Maldonado reports no changes reported at this time.  Changes to insurance: No  Questions for the pharmacist: No    The patient will receive a drug information handout for each medication shipped and additional FDA Medication Guides as required.      DISEASE/MEDICATION-SPECIFIC INFORMATION        N/A    ADHERENCE      Patient reports missing 2 evening doses due to him forgetting.     MEDICARE PART B DOCUMENTATION     Myfortic 180mg : Patient has 10 days worth of  tablets on hand.  Prograf 1mg : Patient has 10 days worth of capsules on hand.    SHIPPING     Shipping address confirmed in Epic.     Delivery Scheduled: Yes, Expected medication delivery date: 01/03/2018 via UPS or courier.     Sharrieff Spratlin Leodis Binet   [facility] Shared [facility] Specialty Technician

## 2018-01-02 MED FILL — PROGRAF 1 MG CAPSULE: 30 days supply | Qty: 360 | Fill #0 | Status: AC

## 2018-01-02 MED FILL — TRUEPLUS PEN NEEDLE 32 GAUGE X 5/32": 30 days supply | Qty: 100 | Fill #0 | Status: AC

## 2018-01-02 MED FILL — PROGRAF 1 MG CAPSULE: 30 days supply | Qty: 360 | Fill #0

## 2018-01-02 MED FILL — NOVOLOG FLEXPEN U-100 INSULIN ASPART 100 UNIT/ML (3 ML) SUBCUTANEOUS: 30 days supply | Qty: 15 | Fill #0

## 2018-01-02 MED FILL — OMEPRAZOLE 20 MG CAPSULE,DELAYED RELEASE: 30 days supply | Qty: 30 | Fill #0 | Status: AC

## 2018-01-02 MED FILL — LANTUS SOLOSTAR U-100 INSULIN 100 UNIT/ML (3 ML) SUBCUTANEOUS PEN: 30 days supply | Qty: 15 | Fill #0 | Status: AC

## 2018-01-02 MED FILL — METOPROLOL TARTRATE 50 MG TABLET: 30 days supply | Qty: 90 | Fill #0

## 2018-01-02 MED FILL — METOPROLOL TARTRATE 50 MG TABLET: 30 days supply | Qty: 90 | Fill #0 | Status: AC

## 2018-01-02 MED FILL — MYFORTIC 180 MG TABLET,DELAYED RELEASE: 30 days supply | Qty: 180 | Fill #0 | Status: AC

## 2018-01-02 MED FILL — MYFORTIC 180 MG TABLET,DELAYED RELEASE: 30 days supply | Qty: 180 | Fill #0

## 2018-01-02 MED FILL — NOVOLOG FLEXPEN U-100 INSULIN ASPART 100 UNIT/ML (3 ML) SUBCUTANEOUS: 30 days supply | Qty: 15 | Fill #0 | Status: AC

## 2018-01-02 MED FILL — OMEPRAZOLE 20 MG CAPSULE,DELAYED RELEASE: ORAL | 30 days supply | Qty: 30 | Fill #0

## 2018-01-02 MED FILL — TRUEPLUS PEN NEEDLE 32 GAUGE X 5/32": 30 days supply | Qty: 100 | Fill #0

## 2018-01-16 DIAGNOSIS — I129 Hypertensive chronic kidney disease with stage 1 through stage 4 chronic kidney disease, or unspecified chronic kidney disease: Secondary | ICD-10-CM | POA: Diagnosis not present

## 2018-01-16 DIAGNOSIS — D899 Disorder involving the immune mechanism, unspecified: Secondary | ICD-10-CM | POA: Diagnosis not present

## 2018-01-16 DIAGNOSIS — Z940 Kidney transplant status: Secondary | ICD-10-CM | POA: Diagnosis not present

## 2018-01-24 NOTE — Unmapped (Signed)
Patient does not need a refill of specialty medication at this time. Moving specialty refill reminder call to appropriate date and removed call attempt data.  Spoke with patient and he states he has enough medication (Prograf & Myfortic)for 2 and half weeks and does not need any refills at this time. He has not missed any doses.

## 2018-01-26 DIAGNOSIS — H54415A Blindness right eye category 5, normal vision left eye: Secondary | ICD-10-CM | POA: Diagnosis not present

## 2018-01-26 DIAGNOSIS — Z23 Encounter for immunization: Secondary | ICD-10-CM | POA: Diagnosis not present

## 2018-01-26 DIAGNOSIS — I129 Hypertensive chronic kidney disease with stage 1 through stage 4 chronic kidney disease, or unspecified chronic kidney disease: Secondary | ICD-10-CM | POA: Diagnosis not present

## 2018-01-26 DIAGNOSIS — Z125 Encounter for screening for malignant neoplasm of prostate: Secondary | ICD-10-CM | POA: Diagnosis not present

## 2018-01-26 DIAGNOSIS — E11319 Type 2 diabetes mellitus with unspecified diabetic retinopathy without macular edema: Secondary | ICD-10-CM | POA: Diagnosis not present

## 2018-01-26 DIAGNOSIS — R808 Other proteinuria: Secondary | ICD-10-CM | POA: Diagnosis not present

## 2018-01-26 DIAGNOSIS — Z6831 Body mass index (BMI) 31.0-31.9, adult: Secondary | ICD-10-CM | POA: Diagnosis not present

## 2018-01-26 DIAGNOSIS — D8989 Other specified disorders involving the immune mechanism, not elsewhere classified: Secondary | ICD-10-CM | POA: Diagnosis not present

## 2018-01-26 DIAGNOSIS — Z940 Kidney transplant status: Secondary | ICD-10-CM | POA: Diagnosis not present

## 2018-01-26 DIAGNOSIS — Z794 Long term (current) use of insulin: Secondary | ICD-10-CM | POA: Diagnosis not present

## 2018-01-26 DIAGNOSIS — E1129 Type 2 diabetes mellitus with other diabetic kidney complication: Secondary | ICD-10-CM | POA: Diagnosis not present

## 2018-01-26 DIAGNOSIS — R82998 Other abnormal findings in urine: Secondary | ICD-10-CM | POA: Diagnosis not present

## 2018-01-26 DIAGNOSIS — E668 Other obesity: Secondary | ICD-10-CM | POA: Diagnosis not present

## 2018-01-26 DIAGNOSIS — I10 Essential (primary) hypertension: Secondary | ICD-10-CM | POA: Diagnosis not present

## 2018-01-27 NOTE — Unmapped (Signed)
[facility] Refill Coordination Note  Specialty Medication(s): Prograf 1mg  and Myfortic 180mg   Additional Medications shipped: pen tips, omeprazole, metoprolol tartrate, Lantus Solostar, Novolog Flex Pen    Benjamin Maldonado, DOB: [birth year]  Phone: [phone] (home) , Alternate phone contact: N/A  Phone or address changes today?: No  All above HIPAA information was verified with patient.  Shipping Address: [address]  [place] [address]   Insurance changes? No    Completed refill call assessment today to schedule patient's medication shipment from the [facility] [phone]).      Confirmed the medication and dosage are correct and have not changed: Yes, regimen is correct and unchanged.    Confirmed patient started or stopped the following medications in the past month:  No, there are no changes reported at this time.    Are you tolerating your medication?:  Benjamin Maldonado reports tolerating the medication.    ADHERENCE    Myfortic 180 mg   Quantity filled last month: 180 tablets   # of tablets left on hand: 54 tablets    Prograf 1 mg   Quantity filled last month: 360 capsules   # of capsules left on hand: 96 capsules        Did you miss any doses in the past 4 weeks? No missed doses reported.    FINANCIAL/SHIPPING    Delivery Scheduled: Yes, Expected medication delivery date: 02/01/18 via UPS     The patient will receive a drug information handout for each medication shipped and additional FDA Medication Guides as required.      Benjamin Maldonado did not have any additional questions at this time.    Delivery address validated in Epic.    We will follow up with patient monthly for standard refill processing and delivery.      Thank you,  Roderic Palau   [facility] Shared [facility] Specialty Pharmacist

## 2018-01-31 MED FILL — METOPROLOL TARTRATE 50 MG TABLET: 30 days supply | Qty: 90 | Fill #1 | Status: AC

## 2018-01-31 MED FILL — TRUEPLUS PEN NEEDLE 32 GAUGE X 5/32": 25 days supply | Qty: 100 | Fill #1 | Status: AC

## 2018-01-31 MED FILL — METOPROLOL TARTRATE 50 MG TABLET: 30 days supply | Qty: 90 | Fill #1

## 2018-01-31 MED FILL — OMEPRAZOLE 20 MG CAPSULE,DELAYED RELEASE: ORAL | 30 days supply | Qty: 30 | Fill #1

## 2018-01-31 MED FILL — OMEPRAZOLE 20 MG CAPSULE,DELAYED RELEASE: 30 days supply | Qty: 30 | Fill #1 | Status: AC

## 2018-01-31 MED FILL — NOVOLOG FLEXPEN U-100 INSULIN ASPART 100 UNIT/ML (3 ML) SUBCUTANEOUS: 83 days supply | Qty: 15 | Fill #1 | Status: AC

## 2018-01-31 MED FILL — PROGRAF 1 MG CAPSULE: 30 days supply | Qty: 360 | Fill #1 | Status: AC

## 2018-01-31 MED FILL — LANTUS SOLOSTAR U-100 INSULIN 100 UNIT/ML (3 ML) SUBCUTANEOUS PEN: 90 days supply | Qty: 15 | Fill #1 | Status: AC

## 2018-01-31 MED FILL — NOVOLOG FLEXPEN U-100 INSULIN ASPART 100 UNIT/ML (3 ML) SUBCUTANEOUS: 83 days supply | Qty: 15 | Fill #1

## 2018-01-31 MED FILL — MYFORTIC 180 MG TABLET,DELAYED RELEASE: 30 days supply | Qty: 180 | Fill #1

## 2018-01-31 MED FILL — PROGRAF 1 MG CAPSULE: 30 days supply | Qty: 360 | Fill #1

## 2018-01-31 MED FILL — TRUEPLUS PEN NEEDLE 32 GAUGE X 5/32": 25 days supply | Qty: 100 | Fill #1

## 2018-01-31 MED FILL — MYFORTIC 180 MG TABLET,DELAYED RELEASE: 30 days supply | Qty: 180 | Fill #1 | Status: AC

## 2018-01-31 MED FILL — LANTUS SOLOSTAR U-100 INSULIN 100 UNIT/ML (3 ML) SUBCUTANEOUS PEN: 90 days supply | Qty: 15 | Fill #1

## 2018-02-13 DIAGNOSIS — Z940 Kidney transplant status: Secondary | ICD-10-CM | POA: Diagnosis not present

## 2018-02-13 DIAGNOSIS — I129 Hypertensive chronic kidney disease with stage 1 through stage 4 chronic kidney disease, or unspecified chronic kidney disease: Secondary | ICD-10-CM | POA: Diagnosis not present

## 2018-02-22 NOTE — Unmapped (Signed)
[facility] Refill Coordination Note    Specialty Medication(s) to be Shipped:   Transplant: Myfortic 180mg  and Prograf 1mg   Other medication(s) to be shipped: Metoprolol 50mg , Trueplus Needles &  Omeprazole 20mg      Benjamin Maldonado, DOB: 1976/02/29  Phone: [phone] (home)     All above HIPAA information was verified with patient.     Completed refill call assessment today to schedule patient's medication shipment from the [facility] [phone]).       Specialty medication(s) and dose(s) confirmed: Regimen is correct and unchanged.   Changes to medications: Benjamin Maldonado no changes reported at this time.  Changes to insurance: No  Questions for the pharmacist: No    The patient will receive a drug information handout for each medication shipped and additional FDA Medication Guides as required.      DISEASE/MEDICATION-SPECIFIC INFORMATION        N/A    ADHERENCE     Medication Adherence    Patient reported X missed doses in the last month:  0  Specialty Medication:  Myfortic 180mg  & Prograf 1mg    Patient is on additional specialty medications:  No  Patient is on more than two specialty medications:  No  Any gaps in refill history greater than 2 weeks in the last 3 months:  no  Demonstrates understanding of importance of adherence:  yes  Informant:  patient  Reliability of informant:  reliable      Adherence tools used:  patient uses a pill box to manage medications      Support network for adherence:  family member      Confirmed plan for next specialty medication refill:  delivery by pharmacy          Refill Coordination    Has the Patients' Contact Information Changed:  No  Is the Shipping Address Different:  No         MEDICARE PART B DOCUMENTATION     Myfortic 180mg : Patient has 10 days worth of tablets on hand.  Prograf 1mg : Patient has 10 days worth of capsules on hand.    SHIPPING     Shipping address confirmed in Epic.     Delivery Scheduled: Yes, Expected medication delivery date: 03/02/2018 via UPS or courier.     Medication will be delivered via UPS to the home address in [place].    Benjamin Maldonado   [facility] Shared [facility] Specialty Technician

## 2018-03-01 MED FILL — PROGRAF 1 MG CAPSULE: 30 days supply | Qty: 360 | Fill #2 | Status: AC

## 2018-03-01 MED FILL — METOPROLOL TARTRATE 50 MG TABLET: 30 days supply | Qty: 90 | Fill #2

## 2018-03-01 MED FILL — TRUEPLUS PEN NEEDLE 32 GAUGE X 5/32": 25 days supply | Qty: 100 | Fill #2 | Status: AC

## 2018-03-01 MED FILL — TRUEPLUS PEN NEEDLE 32 GAUGE X 5/32": 25 days supply | Qty: 100 | Fill #2

## 2018-03-01 MED FILL — OMEPRAZOLE 20 MG CAPSULE,DELAYED RELEASE: 30 days supply | Qty: 30 | Fill #2 | Status: AC

## 2018-03-01 MED FILL — MYFORTIC 180 MG TABLET,DELAYED RELEASE: 30 days supply | Qty: 180 | Fill #2

## 2018-03-01 MED FILL — MYFORTIC 180 MG TABLET,DELAYED RELEASE: 30 days supply | Qty: 180 | Fill #2 | Status: AC

## 2018-03-01 MED FILL — OMEPRAZOLE 20 MG CAPSULE,DELAYED RELEASE: ORAL | 30 days supply | Qty: 30 | Fill #2

## 2018-03-01 MED FILL — METOPROLOL TARTRATE 50 MG TABLET: 30 days supply | Qty: 90 | Fill #2 | Status: AC

## 2018-03-01 MED FILL — PROGRAF 1 MG CAPSULE: 30 days supply | Qty: 360 | Fill #2

## 2018-03-09 DIAGNOSIS — E1129 Type 2 diabetes mellitus with other diabetic kidney complication: Secondary | ICD-10-CM | POA: Diagnosis not present

## 2018-03-09 DIAGNOSIS — Z794 Long term (current) use of insulin: Secondary | ICD-10-CM | POA: Diagnosis not present

## 2018-03-09 DIAGNOSIS — Z940 Kidney transplant status: Secondary | ICD-10-CM | POA: Diagnosis not present

## 2018-03-09 DIAGNOSIS — I10 Essential (primary) hypertension: Secondary | ICD-10-CM | POA: Diagnosis not present

## 2018-03-09 DIAGNOSIS — Z6831 Body mass index (BMI) 31.0-31.9, adult: Secondary | ICD-10-CM | POA: Diagnosis not present

## 2018-03-14 DIAGNOSIS — I129 Hypertensive chronic kidney disease with stage 1 through stage 4 chronic kidney disease, or unspecified chronic kidney disease: Secondary | ICD-10-CM | POA: Diagnosis not present

## 2018-03-14 DIAGNOSIS — Z940 Kidney transplant status: Secondary | ICD-10-CM | POA: Diagnosis not present

## 2018-03-23 NOTE — Unmapped (Signed)
[facility] Refill Coordination Note    Specialty Medication(s) to be Shipped:   Transplant: Myfortic 180mg  and Prograf 1mg   Other medication(s) to be shipped: Omeprazole 20mg , Needles and Metoprolol  50mg      Devontaye L Emberton, DOB: 1975/11/03  Phone: [phone] (home)     All above HIPAA information was verified with patient.     Completed refill call assessment today to schedule patient's medication shipment from the [facility] [phone]).       Specialty medication(s) and dose(s) confirmed: Regimen is correct and unchanged.   Changes to medications: Filip reports no changes reported at this time.  Changes to insurance: No  Questions for the pharmacist: No    The patient will receive a drug information handout for each medication shipped and additional FDA Medication Guides as required.      DISEASE/MEDICATION-SPECIFIC INFORMATION        N/A    ADHERENCE     Medication Adherence    Patient reported X missed doses in the last month:  0  Specialty Medication:  Myfortic 180 & Prograf 1mg   Patient is on additional specialty medications:  No  Patient is on more than two specialty medications:  No  Any gaps in refill history greater than 2 weeks in the last 3 months:  no  Demonstrates understanding of importance of adherence:  yes  Informant:  patient  Reliability of informant:  reliable      Adherence tools used:  patient uses a pill box to manage medications      Support network for adherence:  family member      Confirmed plan for next specialty medication refill:  delivery by pharmacy          Refill Coordination    Has the Patients' Contact Information Changed:  No  Is the Shipping Address Different:  No         MEDICARE PART B DOCUMENTATION     Myfortic 180mg : Patient has 10 days worth of tablets on hand.  Prograf 1mg : Patient has 10 days worth of capsules on hand.    SHIPPING     Shipping address confirmed in Epic.     Delivery Scheduled: Yes, Expected medication delivery date: 03/31/2018 via UPS or courier.     Medication will be delivered via UPS to the home address in [place].    Arland Usery P Allena Katz   [facility] Shared [facility] Specialty Technician

## 2018-03-31 MED ORDER — PEN NEEDLE, DIABETIC 32 GAUGE X 5/32" (4 MM)
PRN refills | 0 days | Status: CP
Start: 2018-03-31 — End: 2019-03-31

## 2018-03-31 MED FILL — MYFORTIC 180 MG TABLET,DELAYED RELEASE: 30 days supply | Qty: 180 | Fill #3

## 2018-03-31 MED FILL — TRUEPLUS PEN NEEDLE 32 GAUGE X 5/32": 25 days supply | Qty: 100 | Fill #0 | Status: AC

## 2018-03-31 MED FILL — TRUEPLUS PEN NEEDLE 32 GAUGE X 5/32": 25 days supply | Qty: 100 | Fill #0

## 2018-03-31 MED FILL — METOPROLOL TARTRATE 50 MG TABLET: 30 days supply | Qty: 90 | Fill #3

## 2018-03-31 MED FILL — OMEPRAZOLE 20 MG CAPSULE,DELAYED RELEASE: ORAL | 30 days supply | Qty: 30 | Fill #3

## 2018-03-31 MED FILL — OMEPRAZOLE 20 MG CAPSULE,DELAYED RELEASE: 30 days supply | Qty: 30 | Fill #3 | Status: AC

## 2018-03-31 MED FILL — MYFORTIC 180 MG TABLET,DELAYED RELEASE: 30 days supply | Qty: 180 | Fill #3 | Status: AC

## 2018-03-31 MED FILL — PROGRAF 1 MG CAPSULE: 30 days supply | Qty: 360 | Fill #3 | Status: AC

## 2018-03-31 MED FILL — METOPROLOL TARTRATE 50 MG TABLET: 30 days supply | Qty: 90 | Fill #3 | Status: AC

## 2018-03-31 MED FILL — PROGRAF 1 MG CAPSULE: 30 days supply | Qty: 360 | Fill #3

## 2018-04-17 DIAGNOSIS — H2512 Age-related nuclear cataract, left eye: Secondary | ICD-10-CM | POA: Diagnosis not present

## 2018-04-17 DIAGNOSIS — H43812 Vitreous degeneration, left eye: Secondary | ICD-10-CM | POA: Diagnosis not present

## 2018-04-17 DIAGNOSIS — I129 Hypertensive chronic kidney disease with stage 1 through stage 4 chronic kidney disease, or unspecified chronic kidney disease: Secondary | ICD-10-CM | POA: Diagnosis not present

## 2018-04-17 DIAGNOSIS — Z940 Kidney transplant status: Secondary | ICD-10-CM | POA: Diagnosis not present

## 2018-04-17 NOTE — Unmapped (Signed)
[facility] Refill and Clinical Coordination Note  Medication(s): prograf, myfortic    Benjamin Maldonado, DOB: [birth year]  Phone: [phone] (home) , Alternate phone contact: N/A  Shipping address: [address]  [place] [address]  Phone or address changes today?: No  All above HIPAA information verified.  Insurance changes? No    Completed refill and clinical call assessment today to schedule patient's medication shipment from the [facility] [phone]).      MEDICATION RECONCILIATION    Confirmed the medication and dosage are correct and have not changed: Yes, regimen is correct and unchanged.    Were there any changes to your medication(s) in the past month:  lantus dose dropped to 13 units daily , may change again per patient, will let us know when needs it filled    ADHERENCE    Is this medicine transplant or covered by Medicare Part B? Yes.    Myfortic 180mg : Patient has 10 days worth of tablets on hand.  Prograf 1mg : Patient has 10 days worth of capsules on hand.    Did you miss any doses in the past 4 weeks? No missed doses reported.  Adherence counseling provided? Not needed     SIDE EFFECT MANAGEMENT    Are you tolerating your medication?:  patient doesn't think it is related to medications because nothing has changed and labwork looks ok, but has been having nightly indigestion almost every single night. He has appt with family doc today to see what is going on, I advised him to let txp clinic know as well. I will also message them today.  Side effect management discussed: see above      Therapy is appropriate and should be continued.    Evidence of clinical benefit: Do you feel that that the medication is helping? Yes      FINANCIAL/SHIPPING    Delivery Scheduled: Yes, Expected medication delivery date: 04/25/18     Medication will be delivered via UPS to the home address in [facility].    Additional medications refilled: metoprolol, pen needles, omeprazole, novolog - 6rx total. pt denies needing lantus at this time.    The patient will receive a drug information handout for each medication shipped and additional FDA Medication Guides as required.      Benjamin Maldonado did not have any additional questions at this time.    Delivery address confirmed in Epic.     We will follow up with patient monthly for standard refill processing and delivery.      Thank you,  [name]   [facility] Shared [facility] Specialty Pharmacist

## 2018-04-24 MED FILL — PROGRAF 1 MG CAPSULE: 30 days supply | Qty: 360 | Fill #4

## 2018-04-24 MED FILL — MYFORTIC 180 MG TABLET,DELAYED RELEASE: 30 days supply | Qty: 180 | Fill #4

## 2018-04-24 MED FILL — OMEPRAZOLE 20 MG CAPSULE,DELAYED RELEASE: ORAL | 30 days supply | Qty: 30 | Fill #4

## 2018-04-24 MED FILL — OMEPRAZOLE 20 MG CAPSULE,DELAYED RELEASE: 30 days supply | Qty: 30 | Fill #4 | Status: AC

## 2018-04-24 MED FILL — MYFORTIC 180 MG TABLET,DELAYED RELEASE: 30 days supply | Qty: 180 | Fill #4 | Status: AC

## 2018-04-24 MED FILL — ULTICARE PEN NEEDLE 32 GAUGE X 5/32": 25 days supply | Qty: 100 | Fill #1 | Status: AC

## 2018-04-24 MED FILL — METOPROLOL TARTRATE 50 MG TABLET: 30 days supply | Qty: 90 | Fill #4

## 2018-04-24 MED FILL — PROGRAF 1 MG CAPSULE: 30 days supply | Qty: 360 | Fill #4 | Status: AC

## 2018-04-24 MED FILL — METOPROLOL TARTRATE 50 MG TABLET: 30 days supply | Qty: 90 | Fill #4 | Status: AC

## 2018-04-24 MED FILL — NOVOLOG FLEXPEN U-100 INSULIN ASPART 100 UNIT/ML (3 ML) SUBCUTANEOUS: 83 days supply | Qty: 15 | Fill #2

## 2018-04-24 MED FILL — ULTICARE PEN NEEDLE 32 GAUGE X 5/32" (4 MM): 25 days supply | Qty: 100 | Fill #1

## 2018-04-24 MED FILL — NOVOLOG FLEXPEN U-100 INSULIN ASPART 100 UNIT/ML (3 ML) SUBCUTANEOUS: 83 days supply | Qty: 15 | Fill #2 | Status: AC

## 2018-05-02 ENCOUNTER — Encounter: Admit: 2018-05-02 | Discharge: 2018-05-02 | Payer: MEDICARE | Attending: Nephrology | Primary: Nephrology

## 2018-05-02 ENCOUNTER — Encounter: Admit: 2018-05-02 | Discharge: 2018-05-02 | Payer: MEDICARE

## 2018-05-02 DIAGNOSIS — D899 Disorder involving the immune mechanism, unspecified: Secondary | ICD-10-CM

## 2018-05-02 DIAGNOSIS — Z940 Kidney transplant status: Principal | ICD-10-CM

## 2018-05-02 DIAGNOSIS — Z0000 Encounter for general adult medical examination without abnormal findings: Secondary | ICD-10-CM

## 2018-05-02 DIAGNOSIS — K219 Gastro-esophageal reflux disease without esophagitis: Secondary | ICD-10-CM

## 2018-05-02 DIAGNOSIS — Z79899 Other long term (current) drug therapy: Secondary | ICD-10-CM

## 2018-05-02 DIAGNOSIS — R0901 Asphyxia: Secondary | ICD-10-CM | POA: Diagnosis not present

## 2018-05-02 DIAGNOSIS — E1122 Type 2 diabetes mellitus with diabetic chronic kidney disease: Secondary | ICD-10-CM | POA: Diagnosis not present

## 2018-05-02 DIAGNOSIS — E1142 Type 2 diabetes mellitus with diabetic polyneuropathy: Secondary | ICD-10-CM | POA: Diagnosis not present

## 2018-05-02 DIAGNOSIS — I120 Hypertensive chronic kidney disease with stage 5 chronic kidney disease or end stage renal disease: Secondary | ICD-10-CM | POA: Diagnosis not present

## 2018-05-02 DIAGNOSIS — Z992 Dependence on renal dialysis: Secondary | ICD-10-CM | POA: Diagnosis not present

## 2018-05-02 DIAGNOSIS — N186 End stage renal disease: Secondary | ICD-10-CM | POA: Diagnosis not present

## 2018-05-02 DIAGNOSIS — E11319 Type 2 diabetes mellitus with unspecified diabetic retinopathy without macular edema: Secondary | ICD-10-CM | POA: Diagnosis not present

## 2018-05-02 DIAGNOSIS — Z794 Long term (current) use of insulin: Secondary | ICD-10-CM | POA: Diagnosis not present

## 2018-05-02 DIAGNOSIS — E875 Hyperkalemia: Secondary | ICD-10-CM | POA: Diagnosis not present

## 2018-05-02 DIAGNOSIS — E785 Hyperlipidemia, unspecified: Secondary | ICD-10-CM | POA: Diagnosis not present

## 2018-05-02 DIAGNOSIS — E1121 Type 2 diabetes mellitus with diabetic nephropathy: Secondary | ICD-10-CM | POA: Diagnosis not present

## 2018-05-02 DIAGNOSIS — Z7982 Long term (current) use of aspirin: Secondary | ICD-10-CM | POA: Diagnosis not present

## 2018-05-02 DIAGNOSIS — N170 Acute kidney failure with tubular necrosis: Secondary | ICD-10-CM | POA: Diagnosis not present

## 2018-05-02 DIAGNOSIS — I2510 Atherosclerotic heart disease of native coronary artery without angina pectoris: Secondary | ICD-10-CM | POA: Diagnosis not present

## 2018-05-02 LAB — PHOSPHORUS: Phosphate:MCnc:Pt:Ser/Plas:Qn:: 3.3

## 2018-05-02 LAB — URINALYSIS
BACTERIA: NONE SEEN /HPF
BLOOD UA: NEGATIVE
GLUCOSE UA: NEGATIVE
KETONES UA: NEGATIVE
LEUKOCYTE ESTERASE UA: NEGATIVE
NITRITE UA: NEGATIVE
PH UA: 6 (ref 5.0–9.0)
RBC UA: 2 /HPF (ref <=3)
SPECIFIC GRAVITY UA: 1.021 (ref 1.003–1.030)
SQUAMOUS EPITHELIAL: <1 /HPF (ref 0–5)
UROBILINOGEN UA: 0.2
WBC UA: 1 /HPF (ref <=2)

## 2018-05-02 LAB — COMPREHENSIVE METABOLIC PANEL
ALBUMIN: 4.4 g/dL (ref 3.5–5.0)
ALKALINE PHOSPHATASE: 60 U/L (ref 38–126)
ALT (SGPT): 21 U/L (ref <50)
ANION GAP: 9 mmol/L (ref 7–15)
AST (SGOT): 27 U/L (ref 19–55)
BILIRUBIN TOTAL: 0.4 mg/dL (ref 0.0–1.2)
BLOOD UREA NITROGEN: 31 mg/dL — ABNORMAL HIGH (ref 7–21)
BUN / CREAT RATIO: 19
CHLORIDE: 107 mmol/L (ref 98–107)
CO2: 25 mmol/L (ref 22.0–30.0)
CREATININE: 1.62 mg/dL — ABNORMAL HIGH (ref 0.70–1.30)
EGFR CKD-EPI AA MALE: 60 mL/min/{1.73_m2} (ref >=60)
EGFR CKD-EPI NON-AA MALE: 52 mL/min/{1.73_m2} — ABNORMAL LOW (ref >=60)
GLUCOSE RANDOM: 61 mg/dL — ABNORMAL LOW (ref 70–179)
POTASSIUM: 4.7 mmol/L (ref 3.5–5.0)
PROTEIN TOTAL: 7.5 g/dL (ref 6.5–8.3)
SODIUM: 141 mmol/L (ref 135–145)

## 2018-05-02 LAB — MAGNESIUM: Magnesium:MCnc:Pt:Ser/Plas:Qn:: 1.6

## 2018-05-02 LAB — CBC W/ AUTO DIFF
BASOPHILS ABSOLUTE COUNT: 0 10*9/L (ref 0.0–0.1)
BASOPHILS RELATIVE PERCENT: 0.3 %
EOSINOPHILS ABSOLUTE COUNT: 0.1 10*9/L (ref 0.0–0.4)
HEMATOCRIT: 43.6 % (ref 41.0–53.0)
HEMOGLOBIN: 13 g/dL — ABNORMAL LOW (ref 13.5–17.5)
LARGE UNSTAINED CELLS: 3 % (ref 0–4)
LYMPHOCYTES ABSOLUTE COUNT: 1.2 10*9/L — ABNORMAL LOW (ref 1.5–5.0)
LYMPHOCYTES RELATIVE PERCENT: 17.9 %
MEAN CORPUSCULAR HEMOGLOBIN: 26.9 pg (ref 26.0–34.0)
MEAN CORPUSCULAR VOLUME: 90.2 fL (ref 80.0–100.0)
MONOCYTES ABSOLUTE COUNT: 0.4 10*9/L (ref 0.2–0.8)
MONOCYTES RELATIVE PERCENT: 5.4 %
NEUTROPHILS ABSOLUTE COUNT: 4.7 10*9/L (ref 2.0–7.5)
PLATELET COUNT: 178 10*9/L (ref 150–440)
RED BLOOD CELL COUNT: 4.84 10*12/L (ref 4.50–5.90)
RED CELL DISTRIBUTION WIDTH: 14.6 % (ref 12.0–15.0)
WBC ADJUSTED: 6.5 10*9/L (ref 4.5–11.0)

## 2018-05-02 LAB — NITRITE UA: Lab: NEGATIVE

## 2018-05-02 LAB — PROTEIN URINE: Protein:MCnc:Pt:Urine:Qn:: 17.6

## 2018-05-02 LAB — SMEAR REVIEW

## 2018-05-02 LAB — PLATELET COUNT: Lab: 178

## 2018-05-02 LAB — BUN / CREAT RATIO: Urea nitrogen/Creatinine:MRto:Pt:Ser/Plas:Qn:: 19
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
## 2018-05-02 NOTE — Unmapped (Signed)
Patient seen in clinic today.    Reports indigestion for the past 2 months, intermittent, more at night.  Foul smelling burps. Take Prilosec in the morning.    Denies HA, CP, SOB, abdominal pain, n/v/d, swelling, urinary problems, fevers, or tremors.    BS avg 160. No lows and one high at 225.    Labs today took prograf at 915.

## 2018-05-03 DIAGNOSIS — Z794 Long term (current) use of insulin: Secondary | ICD-10-CM | POA: Diagnosis not present

## 2018-05-03 DIAGNOSIS — Z6832 Body mass index (BMI) 32.0-32.9, adult: Secondary | ICD-10-CM | POA: Diagnosis not present

## 2018-05-03 DIAGNOSIS — Z940 Kidney transplant status: Secondary | ICD-10-CM | POA: Diagnosis not present

## 2018-05-03 DIAGNOSIS — E1129 Type 2 diabetes mellitus with other diabetic kidney complication: Secondary | ICD-10-CM | POA: Diagnosis not present

## 2018-05-03 DIAGNOSIS — I10 Essential (primary) hypertension: Secondary | ICD-10-CM | POA: Diagnosis not present

## 2018-05-03 LAB — BK VIRUS QUANTITATIVE PCR, BLOOD

## 2018-05-03 LAB — BK BLOOD COMMENT: Lab: 0

## 2018-05-04 DIAGNOSIS — Z940 Kidney transplant status: Secondary | ICD-10-CM | POA: Diagnosis not present

## 2018-05-04 DIAGNOSIS — Z6832 Body mass index (BMI) 32.0-32.9, adult: Secondary | ICD-10-CM | POA: Diagnosis not present

## 2018-05-04 DIAGNOSIS — R808 Other proteinuria: Secondary | ICD-10-CM | POA: Diagnosis not present

## 2018-05-04 DIAGNOSIS — I10 Essential (primary) hypertension: Secondary | ICD-10-CM | POA: Diagnosis not present

## 2018-05-04 DIAGNOSIS — D8989 Other specified disorders involving the immune mechanism, not elsewhere classified: Secondary | ICD-10-CM | POA: Diagnosis not present

## 2018-05-04 DIAGNOSIS — Z794 Long term (current) use of insulin: Secondary | ICD-10-CM | POA: Diagnosis not present

## 2018-05-04 DIAGNOSIS — Z1389 Encounter for screening for other disorder: Secondary | ICD-10-CM | POA: Diagnosis not present

## 2018-05-04 DIAGNOSIS — E11319 Type 2 diabetes mellitus with unspecified diabetic retinopathy without macular edema: Secondary | ICD-10-CM | POA: Diagnosis not present

## 2018-05-04 DIAGNOSIS — G4733 Obstructive sleep apnea (adult) (pediatric): Secondary | ICD-10-CM | POA: Diagnosis not present

## 2018-05-04 DIAGNOSIS — I129 Hypertensive chronic kidney disease with stage 1 through stage 4 chronic kidney disease, or unspecified chronic kidney disease: Secondary | ICD-10-CM | POA: Diagnosis not present

## 2018-05-04 DIAGNOSIS — E1129 Type 2 diabetes mellitus with other diabetic kidney complication: Secondary | ICD-10-CM | POA: Diagnosis not present

## 2018-05-04 LAB — TACROLIMUS, TROUGH: Lab: 5.5

## 2018-05-04 LAB — CMV COMMENT: Lab: 0

## 2018-05-04 LAB — CMV DNA, QUANTITATIVE, PCR

## 2018-05-17 NOTE — Unmapped (Signed)
[facility] Refill Coordination [facility]    Specialty Medication(s) to be Shipped:   Transplant: Myfortic 180mg  and Prograf 1mg     Other medication(s) to be shipped:   Pen Needle  Omprazole 20mg   Metoprolol 50mg      Benjamin Maldonado, DOB: 19-Feb-1976  Phone: [phone] (home)       All above HIPAA information was verified with patient.     Completed refill call assessment today to schedule patient's medication shipment from the [facility] [phone]).       Specialty medication(s) and dose(s) confirmed: Regimen is correct and unchanged.   Changes to medications: Camil reports no changes reported at this time.  Changes to insurance: No  Questions for the pharmacist: No    The patient will receive a drug information handout for each medication shipped and additional FDA Medication Guides as required.      DISEASE/MEDICATION-SPECIFIC INFORMATION        N/A    ADHERENCE     Medication Adherence    Patient reported X missed doses in the last month:  0  Adherence tools used:  patient uses a pill box to manage medications  Support network for adherence:  family member              MEDICARE PART B DOCUMENTATION     Myfortic 180mg : Patient has 8 days worth of tablets on hand.  Prograf 1mg : Patient has 8 days worth of capsules on hand.    SHIPPING     Shipping address confirmed in Epic.     Delivery Scheduled: Yes, Expected medication delivery date: 05/19/18 via UPS or courier.     Medication will be delivered via UPS to the home address in [place].    Benjamin Maldonado   [facility] Shared [facility] Specialty Technician

## 2018-05-18 MED FILL — PROGRAF 1 MG CAPSULE: 30 days supply | Qty: 360 | Fill #5 | Status: AC

## 2018-05-18 MED FILL — PROGRAF 1 MG CAPSULE: 30 days supply | Qty: 360 | Fill #5

## 2018-05-18 MED FILL — OMEPRAZOLE 20 MG CAPSULE,DELAYED RELEASE: 30 days supply | Qty: 30 | Fill #5 | Status: AC

## 2018-05-18 MED FILL — METOPROLOL TARTRATE 50 MG TABLET: 30 days supply | Qty: 90 | Fill #5

## 2018-05-18 MED FILL — OMEPRAZOLE 20 MG CAPSULE,DELAYED RELEASE: ORAL | 30 days supply | Qty: 30 | Fill #5

## 2018-05-18 MED FILL — MYFORTIC 180 MG TABLET,DELAYED RELEASE: 30 days supply | Qty: 180 | Fill #5

## 2018-05-18 MED FILL — ULTICARE PEN NEEDLE 32 GAUGE X 5/32": 25 days supply | Qty: 100 | Fill #2 | Status: AC

## 2018-05-18 MED FILL — ULTICARE PEN NEEDLE 32 GAUGE X 5/32" (4 MM): 25 days supply | Qty: 100 | Fill #2

## 2018-05-18 MED FILL — METOPROLOL TARTRATE 50 MG TABLET: 30 days supply | Qty: 90 | Fill #5 | Status: AC

## 2018-05-18 MED FILL — MYFORTIC 180 MG TABLET,DELAYED RELEASE: 30 days supply | Qty: 180 | Fill #5 | Status: AC

## 2018-06-07 MED ORDER — PROGRAF 1 MG CAPSULE
ORAL_CAPSULE | 11 refills | 0 days | Status: CP
Start: 2018-06-07 — End: 2018-12-12
  Filled 2018-06-15: qty 360, 30d supply, fill #0

## 2018-06-07 MED ORDER — INSULIN ASPART (U-100) 100 UNIT/ML (3 ML) SUBCUTANEOUS PEN
3 refills | 0 days | Status: CP
Start: 2018-06-07 — End: 2018-06-08

## 2018-06-07 MED ORDER — MYFORTIC 180 MG TABLET,DELAYED RELEASE
ORAL_TABLET | 11 refills | 0 days | Status: CP
Start: 2018-06-07 — End: 2019-06-07
  Filled 2018-06-15: qty 180, 30d supply, fill #0

## 2018-06-07 NOTE — Unmapped (Signed)
[facility] Pharm[facility] Refill Coordination Note    Specialty Medication(s) to be Shipped:   Transplant: Myfortic 180mg  and Prograf 1mg   Other medication(s) to be shipped: Omeprazole 20mg , Ulticare Pen Needles & Novolog Flexpen  **Faxed md for refill on Myfortic, Prograf and Novolog **     Benjamin Maldonado IIAMS, DOB: June 30, 1975  Phone: [phone] (home)     All above HIPAA information was verified with patient.     Completed refill call assessment today to schedule patient's medication shipment from the [facility] [phone]).       Specialty medication(s) and dose(s) confirmed: Regimen is correct and unchanged.   Changes to medications: Adel reports no changes reported at this time.  Changes to insurance: No  Questions for the pharmacist: No    The patient will receive a drug information handout for each medication shipped and additional FDA Medication Guides as required.      DISEASE/MEDICATION-SPECIFIC INFORMATION        N/A    ADHERENCE     Medication Adherence    Patient reported X missed doses in the last month:  0  Specialty Medication:  Myfortic 180mg  & Prograf 1mg    Patient is on additional specialty medications:  No  Patient is on more than two specialty medications:  No  Any gaps in refill history greater than 2 weeks in the last 3 months:  no  Demonstrates understanding of importance of adherence:  yes  Informant:  patient  Reliability of informant:  reliable  Adherence tools used:  patient uses a pill box to manage medications  Support network for adherence:  family member  Confirmed plan for next specialty medication refill:  delivery by pharmacy          Refill Coordination    Has the Patients' Contact Information Changed:  No  Is the Shipping Address Different:  No         MEDICARE PART B DOCUMENTATION     Myfortic 180mg : Patient has 10 days worth of tablets on hand.  Prograf 1mg : Patient has 10 days worth of capsules on hand.    SHIPPING     Shipping address confirmed in Epic.     Delivery Scheduled: Yes, Expected medication delivery date: 06/16/2018 via UPS or courier.     Medication will be delivered via UPS to the home address in [employer] [place].    Renn Stille P Allena Katz   [facility] Shared [facility] Specialty Technician

## 2018-06-08 MED ORDER — INSULIN ASPART (U-100) 100 UNIT/ML (3 ML) SUBCUTANEOUS PEN
11 refills | 0 days | Status: CP
Start: 2018-06-08 — End: ?
  Filled 2018-06-15: qty 30, 55d supply, fill #0

## 2018-06-15 MED FILL — MYFORTIC 180 MG TABLET,DELAYED RELEASE: 30 days supply | Qty: 180 | Fill #0 | Status: AC

## 2018-06-15 MED FILL — PROGRAF 1 MG CAPSULE: 30 days supply | Qty: 360 | Fill #0 | Status: AC

## 2018-06-15 MED FILL — OMEPRAZOLE 20 MG CAPSULE,DELAYED RELEASE: ORAL | 30 days supply | Qty: 30 | Fill #6

## 2018-06-15 MED FILL — ULTICARE PEN NEEDLE 32 GAUGE X 5/32": 25 days supply | Qty: 100 | Fill #3 | Status: AC

## 2018-06-15 MED FILL — ULTICARE PEN NEEDLE 32 GAUGE X 5/32" (4 MM): 25 days supply | Qty: 100 | Fill #3

## 2018-06-15 MED FILL — NOVOLOG FLEXPEN U-100 INSULIN ASPART 100 UNIT/ML (3 ML) SUBCUTANEOUS: 55 days supply | Qty: 30 | Fill #0 | Status: AC

## 2018-06-15 MED FILL — OMEPRAZOLE 20 MG CAPSULE,DELAYED RELEASE: 30 days supply | Qty: 30 | Fill #6 | Status: AC

## 2018-06-16 DIAGNOSIS — I129 Hypertensive chronic kidney disease with stage 1 through stage 4 chronic kidney disease, or unspecified chronic kidney disease: Secondary | ICD-10-CM | POA: Diagnosis not present

## 2018-06-16 DIAGNOSIS — E1129 Type 2 diabetes mellitus with other diabetic kidney complication: Secondary | ICD-10-CM | POA: Diagnosis not present

## 2018-06-16 DIAGNOSIS — E785 Hyperlipidemia, unspecified: Secondary | ICD-10-CM | POA: Diagnosis not present

## 2018-06-16 DIAGNOSIS — Z940 Kidney transplant status: Secondary | ICD-10-CM | POA: Diagnosis not present

## 2018-06-19 DIAGNOSIS — Z79899 Other long term (current) drug therapy: Principal | ICD-10-CM

## 2018-06-19 DIAGNOSIS — Z940 Kidney transplant status: Principal | ICD-10-CM

## 2018-06-22 NOTE — Unmapped (Signed)
Patient called to report that he started to have cold/congestion for the past 3 days.    Reports nasal congestion and now cough. Pt spoke to his PCP who called in a n antibiotic.  Went over safe OTC medications and encouraged increased hydration.    Denies any other questions or concerns

## 2018-07-10 NOTE — Unmapped (Signed)
[facility] Pharm[facility] Refill Coordination Note    Specialty Medication(s) to be Shipped:   Transplant: Myfortic 180mg  and Prograf 1mg   Other medication(s) to be shipped: Omeprazole 20mg , Metoprolol Tart 50mg  & Ulticare Pen Needles     Benjamin Maldonado, DOB: 12/14/1975  Phone: [phone] (home)     All above HIPAA information was verified with patient.     Completed refill call assessment today to schedule patient's medication shipment from the [facility] [phone]).       Specialty medication(s) and dose(s) confirmed: Regimen is correct and unchanged.   Changes to medications: Lark reports no changes reported at this time.  Changes to insurance: No  Questions for the pharmacist: No    Confirmed patient received Welcome Packet with first shipment. The patient will receive a drug information handout for each medication shipped and additional FDA Medication Guides as required.       DISEASE/MEDICATION-SPECIFIC INFORMATION        N/A    SPECIALTY MEDICATION ADHERENCE     Medication Adherence    Patient reported X missed doses in the last month:  0  Specialty Medication:  Myfortic 180mg   Patient is on additional specialty medications:  Yes  Additional Specialty Medications:  Prograf 1mg    Patient Reported Additional Medication X Missed Doses in the Last Month:  0  Patient is on more than two specialty medications:  No  Any gaps in refill history greater than 2 weeks in the last 3 months:  no  Adherence tools used:  patient uses a pill box to manage medications  Support network for adherence:  family member        Myfortic 180mg : 10 days of medicine on hand   Prograf 1 mg: 10 days of medicine on hand     SHIPPING     Shipping address confirmed in Epic.     Delivery Scheduled: Yes, Expected medication delivery date: 07/19/2018.     Medication will be delivered via UPS to the home address in [employer] [place].    Kaydyn Sayas P Allena Katz   [facility] Shared [facility] Specialty Technician

## 2018-07-17 DIAGNOSIS — Z79899 Other long term (current) drug therapy: Principal | ICD-10-CM

## 2018-07-17 DIAGNOSIS — Z940 Kidney transplant status: Principal | ICD-10-CM

## 2018-07-18 MED FILL — OMEPRAZOLE 20 MG CAPSULE,DELAYED RELEASE: 30 days supply | Qty: 30 | Fill #7 | Status: AC

## 2018-07-18 MED FILL — METOPROLOL TARTRATE 50 MG TABLET: 30 days supply | Qty: 90 | Fill #6

## 2018-07-18 MED FILL — ULTICARE PEN NEEDLE 32 GAUGE X 5/32": 25 days supply | Qty: 100 | Fill #4 | Status: AC

## 2018-07-18 MED FILL — MYFORTIC 180 MG TABLET,DELAYED RELEASE: 30 days supply | Qty: 180 | Fill #1 | Status: AC

## 2018-07-18 MED FILL — PROGRAF 1 MG CAPSULE: 30 days supply | Qty: 360 | Fill #1 | Status: AC

## 2018-07-18 MED FILL — MYFORTIC 180 MG TABLET,DELAYED RELEASE: 30 days supply | Qty: 180 | Fill #1

## 2018-07-18 MED FILL — OMEPRAZOLE 20 MG CAPSULE,DELAYED RELEASE: ORAL | 30 days supply | Qty: 30 | Fill #7

## 2018-07-18 MED FILL — ULTICARE PEN NEEDLE 32 GAUGE X 5/32" (4 MM): 25 days supply | Qty: 100 | Fill #4

## 2018-07-18 MED FILL — PROGRAF 1 MG CAPSULE: 30 days supply | Qty: 360 | Fill #1

## 2018-07-18 MED FILL — METOPROLOL TARTRATE 50 MG TABLET: 30 days supply | Qty: 90 | Fill #6 | Status: AC

## 2018-08-09 MED ORDER — INSULIN GLARGINE (U-100) 100 UNIT/ML (3 ML) SUBCUTANEOUS PEN
11 refills | 0 days | Status: CP
Start: 2018-08-09 — End: 2018-10-04
  Filled 2018-08-10: qty 15, 107d supply, fill #0

## 2018-08-09 MED ORDER — METOPROLOL TARTRATE 50 MG TABLET
ORAL_TABLET | PRN refills | 0 days | Status: CP
Start: 2018-08-09 — End: 2019-08-09
  Filled 2018-08-10: qty 90, 30d supply, fill #0

## 2018-08-09 NOTE — Unmapped (Addendum)
Called patient and confirmed new delivery date for all meds as 08/11/18.  Marletta Lor, Pharmacist  [facility]  [phone]    __________________________________________________________________    [facility] Refill Coordination Note    Specialty Medication(s) to be Shipped:   Transplant: Myfortic 180mg  and Prograf 1mg     Other medication(s) to be shipped:   Metoptolol 50mg   Novolog Flexpen  Omprazole 20mg   Pen Needles  Lantus - Expected medication delivery date: 08/11/18.      [name], DOB: 04/04/1976  Phone: [phone] (home)       All above HIPAA information was verified with patient.     Completed refill call assessment today to schedule patient's medication shipment from the [facility] [phone]).       Specialty medication(s) and dose(s) confirmed: Regimen is correct and unchanged.   Changes to medications: Kiaan reports no changes reported at this time.  Changes to insurance: No  Questions for the pharmacist: No    Confirmed patient received Welcome Packet with first shipment. The patient will receive a drug information handout for each medication shipped and additional FDA Medication Guides as required.       DISEASE/MEDICATION-SPECIFIC INFORMATION        N/A    SPECIALTY MEDICATION ADHERENCE     Medication Adherence    Patient reported X missed doses in the last month:  0  Adherence tools used:  patient uses a pill box to manage medications  Support network for adherence:  family member        Myfortic 180mg : 8 days worth of on hand.  Prograf 1mg : 8 days worth of on hand.        SHIPPING     Shipping address confirmed in Epic.     Delivery Scheduled: Yes, Expected medication delivery date: 08/14/18.     Medication will be delivered via UPS to the home address in [place].    [place] A Azarian Starace   [facility] Specialty Technician

## 2018-08-10 MED FILL — ULTICARE PEN NEEDLE 32 GAUGE X 5/32": 25 days supply | Qty: 100 | Fill #5 | Status: AC

## 2018-08-10 MED FILL — ULTICARE PEN NEEDLE 32 GAUGE X 5/32" (4 MM): 25 days supply | Qty: 100 | Fill #5

## 2018-08-10 MED FILL — OMEPRAZOLE 20 MG CAPSULE,DELAYED RELEASE: ORAL | 30 days supply | Qty: 30 | Fill #8

## 2018-08-10 MED FILL — NOVOLOG FLEXPEN U-100 INSULIN ASPART 100 UNIT/ML (3 ML) SUBCUTANEOUS: 55 days supply | Qty: 30 | Fill #1 | Status: AC

## 2018-08-10 MED FILL — METOPROLOL TARTRATE 50 MG TABLET: 30 days supply | Qty: 90 | Fill #0 | Status: AC

## 2018-08-10 MED FILL — LANTUS SOLOSTAR U-100 INSULIN 100 UNIT/ML (3 ML) SUBCUTANEOUS PEN: 107 days supply | Qty: 15 | Fill #0 | Status: AC

## 2018-08-10 MED FILL — NOVOLOG FLEXPEN U-100 INSULIN ASPART 100 UNIT/ML (3 ML) SUBCUTANEOUS: 55 days supply | Qty: 30 | Fill #1

## 2018-08-10 MED FILL — OMEPRAZOLE 20 MG CAPSULE,DELAYED RELEASE: 30 days supply | Qty: 30 | Fill #8 | Status: AC

## 2018-08-10 MED FILL — MYFORTIC 180 MG TABLET,DELAYED RELEASE: 30 days supply | Qty: 180 | Fill #2

## 2018-08-10 MED FILL — PROGRAF 1 MG CAPSULE: 30 days supply | Qty: 360 | Fill #2

## 2018-08-10 MED FILL — MYFORTIC 180 MG TABLET,DELAYED RELEASE: 30 days supply | Qty: 180 | Fill #2 | Status: AC

## 2018-08-10 MED FILL — PROGRAF 1 MG CAPSULE: 30 days supply | Qty: 360 | Fill #2 | Status: AC

## 2018-08-16 ENCOUNTER — Encounter: Payer: Self-pay | Admitting: Podiatry

## 2018-08-16 ENCOUNTER — Other Ambulatory Visit: Payer: Self-pay

## 2018-08-16 ENCOUNTER — Ambulatory Visit (INDEPENDENT_AMBULATORY_CARE_PROVIDER_SITE_OTHER): Payer: Medicare Other

## 2018-08-16 ENCOUNTER — Ambulatory Visit (INDEPENDENT_AMBULATORY_CARE_PROVIDER_SITE_OTHER): Payer: Medicare Other | Admitting: Podiatry

## 2018-08-16 VITALS — BP 154/77 | HR 77 | Temp 97.5°F

## 2018-08-16 DIAGNOSIS — M79674 Pain in right toe(s): Secondary | ICD-10-CM | POA: Diagnosis not present

## 2018-08-16 DIAGNOSIS — B351 Tinea unguium: Secondary | ICD-10-CM

## 2018-08-16 DIAGNOSIS — S90122A Contusion of left lesser toe(s) without damage to nail, initial encounter: Secondary | ICD-10-CM

## 2018-08-16 DIAGNOSIS — M79675 Pain in left toe(s): Secondary | ICD-10-CM

## 2018-08-16 NOTE — Progress Notes (Signed)
Subjective:   Patient ID: Anthony Blanchard, male   DOB: 43 y.o.   MRN: [id number]   HPI Patient presents stating my left big toe and left fifth toe have some bruising and blistering and I want to make sure it was okay and my nails need to be cut and I do have diabetes that is not good control   ROS      Objective:  Physical Exam  Neurovascular status unchanged from previous visit with patient found to have blistering of the left hallux fifth digit with trauma to the toes and had worn a shoe that was too tight with nail disease 1-5 both feet with thick incurvation noted     Assessment:  Mycotic nail infection with lesion formation secondary to trauma     Plan:  H&P x-ray left reviewed debrided nailbeds 1-5 both feet with no iatrogenic bleeding and advised this patient on continuation of conservative treatment and to utilize soaks and if any drainage were to occur redness or swelling will start him on antibiotic and he will reappoint immediately  X-ray indicated that there is no indications of bone pathology appears to be soft tissue

## 2018-08-17 DIAGNOSIS — Z940 Kidney transplant status: Secondary | ICD-10-CM | POA: Diagnosis not present

## 2018-08-17 DIAGNOSIS — E785 Hyperlipidemia, unspecified: Secondary | ICD-10-CM | POA: Diagnosis not present

## 2018-08-17 DIAGNOSIS — E1129 Type 2 diabetes mellitus with other diabetic kidney complication: Secondary | ICD-10-CM | POA: Diagnosis not present

## 2018-08-17 DIAGNOSIS — D899 Disorder involving the immune mechanism, unspecified: Secondary | ICD-10-CM | POA: Diagnosis not present

## 2018-08-17 DIAGNOSIS — I2510 Atherosclerotic heart disease of native coronary artery without angina pectoris: Secondary | ICD-10-CM | POA: Diagnosis not present

## 2018-08-17 DIAGNOSIS — I129 Hypertensive chronic kidney disease with stage 1 through stage 4 chronic kidney disease, or unspecified chronic kidney disease: Secondary | ICD-10-CM | POA: Diagnosis not present

## 2018-08-25 DIAGNOSIS — H401122 Primary open-angle glaucoma, left eye, moderate stage: Secondary | ICD-10-CM | POA: Diagnosis not present

## 2018-08-25 DIAGNOSIS — Z0100 Encounter for examination of eyes and vision without abnormal findings: Secondary | ICD-10-CM | POA: Diagnosis not present

## 2018-08-25 DIAGNOSIS — E113513 Type 2 diabetes mellitus with proliferative diabetic retinopathy with macular edema, bilateral: Secondary | ICD-10-CM | POA: Diagnosis not present

## 2018-09-06 NOTE — Unmapped (Signed)
[facility] Clinical Assessment & Refill Coordination Note    Benjamin Maldonado, DOB: 03-Jul-1975  Phone: [phone] (home)     All above HIPAA information was verified with patient.     Specialty Medication(s):   Transplant: Myfortic 180mg  and Prograf 1mg      Current Outpatient Medications   Medication Sig Dispense Refill   ??? acetaminophen (TYLENOL) 325 MG tablet Take 2 tablets (650 mg total) by mouth every six (6) hours as needed for pain. 100 tablet 2   ??? aspirin (ADULT LOW DOSE ASPIRIN) 81 MG tablet Take 1 tablet (81 mg total) by mouth daily. 30 tablet 11   ??? atorvastatin (LIPITOR) 20 MG tablet Take 1 tablet (20 mg total) by mouth daily. 90 tablet 3   ??? blood sugar diagnostic Strp by Other route Four (4) times a day (before meals and nightly). One Touch 100 strip 5   ??? blood-glucose meter (GLUCOSE MONITORING KIT) kit Use as instructed 1 each 0   ??? GLUC.METER,DIS.P-LOADED STRIPS (GLUCOSE METER, DISP & STRIPS MISC) Frequency:PHARMDIR   Dosage:0.0     Instructions:  Note:250.00, life long Dose: 1     ??? insulin ASPART (NOVOLOG FLEXPEN) 100 unit/mL (3 mL) injection pen Inject 4 units under skin with breakfast, 8 units with lunch, and 6 units with supper and sliding scale. Max units 54/day 30 mL 11   ??? insulin glargine (BASAGLAR, LANTUS) 100 unit/mL (3 mL) injection pen INJECT 14 UNITS UNDER THE SKIN NIGHTLY 15 mL 11   ??? metoprolol tartrate (LOPRESSOR) 50 MG tablet TAKE 1 AND 1/2 TABLETS (75 MG) BY MOUTH TWICE DAILY 90 tablet PRN   ??? MYFORTIC 180 mg EC tablet TAKE 3 TABLETS (540 MG) BY MOUTH TWICE DAILY 180 tablet 11   ??? omeprazole (PRILOSEC) 20 MG capsule TAKE 1 CAPSULE BY MOUTH ONCE DAILY 30 capsule 99   ??? pen needle, diabetic 32 gauge x 5/32 Ndle USE TO ADMINISTER INSULIN 4 TIMES DAILY 100 each PRN   ??? PROGRAF 1 mg capsule TAKE 6 CAPSULES (6 MG TOTAL) BY MOUTH EVERY TWELVE (12) HOURS. 360 capsule 11     No current facility-administered medications for this visit.         Changes to medications: Jahmez reports no changes at this time.    Allergies   Allergen Reactions   ??? Iodine Other (See Comments)   ??? Iodine And Iodide Containing Products    ??? Shellfish Containing Products Other (See Comments)       Changes to allergies: No    SPECIALTY MEDICATION ADHERENCE     Myfortic 180 mg: 13 days of medicine on hand   Prograf 1 mg: 13 days of medicine on hand     Medication Adherence    Patient reported X missed doses in the last month:  0  Specialty Medication:  Myfortic 180mg   Patient is on additional specialty medications:  Yes  Additional Specialty Medications:  Prograf 54m  Patient Reported Additional Medication X Missed Doses in the Last Month:  0  Adherence tools used:  patient uses a pill box to manage medications  Support network for adherence:  family member          Specialty medication(s) dose(s) confirmed: Regimen is correct and unchanged.     Are there any concerns with adherence? No    Adherence counseling provided? Not needed    CLINICAL MANAGEMENT AND INTERVENTION      Clinical Benefit Assessment:    Do  you feel the medicine is effective or helping your condition? Yes    Clinical Benefit counseling provided? Not needed    Adverse Effects Assessment:    Are you experiencing any side effects? No    Are you experiencing difficulty administering your medicine? No    Quality of Life Assessment:    How many days over the past month did your kidney transplant  keep you from your normal activities? For example, brushing your teeth or getting up in the morning. 0    Have you discussed this with your provider? Not needed    Therapy Appropriateness:    Is therapy appropriate? Yes, therapy is appropriate and should be continued    DISEASE/MEDICATION-SPECIFIC INFORMATION      N/A    PATIENT SPECIFIC NEEDS     ? Does the patient have any physical, cognitive, or cultural barriers? No    ? Is the patient high risk? Yes, patient taking a REMS drug     ? Does the patient require a Care Management Plan? No ? Does the patient require physician intervention or other additional services (i.e. nutrition, smoking cessation, social work)? No      SHIPPING     Specialty Medication(s) to be Shipped:   Transplant: Myfortic 180mg  and Prograf 1mg     Other medication(s) to be shipped: none     Changes to insurance: No    Delivery Scheduled: Yes, Expected medication delivery date: 09/13/2018.     Medication will be delivered via UPS to the confirmed home address in [facility].    The patient will receive a drug information handout for each medication shipped and additional FDA Medication Guides as required.  Verified that patient has previously received a Conservation officer, historic buildings.    All of the patient's questions and concerns have been addressed.    Tera Helper   [facility] Specialty Pharmacist

## 2018-09-07 DIAGNOSIS — Z940 Kidney transplant status: Secondary | ICD-10-CM | POA: Diagnosis not present

## 2018-09-07 DIAGNOSIS — D8989 Other specified disorders involving the immune mechanism, not elsewhere classified: Secondary | ICD-10-CM | POA: Diagnosis not present

## 2018-09-07 DIAGNOSIS — E1129 Type 2 diabetes mellitus with other diabetic kidney complication: Secondary | ICD-10-CM | POA: Diagnosis not present

## 2018-09-07 DIAGNOSIS — G4733 Obstructive sleep apnea (adult) (pediatric): Secondary | ICD-10-CM | POA: Diagnosis not present

## 2018-09-07 DIAGNOSIS — E669 Obesity, unspecified: Secondary | ICD-10-CM | POA: Diagnosis not present

## 2018-09-07 DIAGNOSIS — I129 Hypertensive chronic kidney disease with stage 1 through stage 4 chronic kidney disease, or unspecified chronic kidney disease: Secondary | ICD-10-CM | POA: Diagnosis not present

## 2018-09-07 DIAGNOSIS — Z794 Long term (current) use of insulin: Secondary | ICD-10-CM | POA: Diagnosis not present

## 2018-09-07 DIAGNOSIS — R809 Proteinuria, unspecified: Secondary | ICD-10-CM | POA: Diagnosis not present

## 2018-09-07 DIAGNOSIS — E11319 Type 2 diabetes mellitus with unspecified diabetic retinopathy without macular edema: Secondary | ICD-10-CM | POA: Diagnosis not present

## 2018-09-12 MED ORDER — OMEPRAZOLE 20 MG CAPSULE,DELAYED RELEASE
ORAL_CAPSULE | ORAL | PRN refills | 0 days | Status: CP
Start: 2018-09-12 — End: 2019-09-12
  Filled 2018-09-12: qty 30, 30d supply, fill #0

## 2018-09-12 MED FILL — MYFORTIC 180 MG TABLET,DELAYED RELEASE: 30 days supply | Qty: 180 | Fill #3 | Status: AC

## 2018-09-12 MED FILL — PROGRAF 1 MG CAPSULE: 30 days supply | Qty: 360 | Fill #3

## 2018-09-12 MED FILL — MYFORTIC 180 MG TABLET,DELAYED RELEASE: 30 days supply | Qty: 180 | Fill #3

## 2018-09-12 MED FILL — OMEPRAZOLE 20 MG CAPSULE,DELAYED RELEASE: 30 days supply | Qty: 30 | Fill #0 | Status: AC

## 2018-09-12 MED FILL — PROGRAF 1 MG CAPSULE: 30 days supply | Qty: 360 | Fill #3 | Status: AC

## 2018-10-04 MED ORDER — INSULIN GLARGINE (U-100) 100 UNIT/ML (3 ML) SUBCUTANEOUS PEN: 16 [IU] | mL | Freq: Every evening | 11 refills | 0 days | Status: AC

## 2018-10-04 MED ORDER — INSULIN GLARGINE (U-100) 100 UNIT/ML (3 ML) SUBCUTANEOUS PEN
Freq: Every evening | SUBCUTANEOUS | 11 refills | 0.00000 days | Status: CP
Start: 2018-10-04 — End: 2018-10-04
  Filled 2018-10-23: qty 15, 90d supply, fill #0

## 2018-10-04 MED ORDER — INSULIN GLARGINE (U-100) 100 UNIT/ML (3 ML) SUBCUTANEOUS PEN: mL | 11 refills | 0 days | Status: AC

## 2018-10-04 NOTE — Unmapped (Signed)
[facility] Refill Coordination Note    Specialty Medication(s) to be Shipped:   Transplant: Myfortic 180mg  and Prograf 1mg   Other medication(s) to be shipped: Lantus, Metoprolol Tart 50mg , Omeprazole 20mg  & Ulticare Pen Needles     Benjamin Maldonado, DOB: 1976-02-16  Phone: [phone] (home)     All above HIPAA information was verified with patient.     Completed refill call assessment today to schedule patient's medication shipment from the [facility] [phone]).       Specialty medication(s) and dose(s) confirmed: Regimen is correct and unchanged.   Changes to medications: Benjamin Maldonado reports no changes reported at this time.  Changes to insurance: No  Questions for the pharmacist: No    Confirmed patient received Welcome Packet with first shipment. The patient will receive a drug information handout for each medication shipped and additional FDA Medication Guides as required.       DISEASE/MEDICATION-SPECIFIC INFORMATION        N/A    SPECIALTY MEDICATION ADHERENCE     Medication Adherence    Patient reported X missed doses in the last month:  1  Specialty Medication:  Myfortic 180mg   Patient is on additional specialty medications:  Yes  Additional Specialty Medications:  Prograf 1mg    Patient Reported Additional Medication X Missed Doses in the Last Month:  0  Patient is on more than two specialty medications:  No  Adherence tools used:  patient uses a pill box to manage medications  Support network for adherence:  family member        Prograf 1 mg: 9 days of medicine on hand   Myfortic 180 mg: 9 days of medicine on hand     SHIPPING     Shipping address confirmed in Epic.     Delivery Scheduled: Yes, Expected medication delivery date: 10/12/2018.     Medication will be delivered via UPS to the home address in Epic [place].    Tamyka Bezio P Allena Katz   [facility] Specialty Technician

## 2018-10-11 MED FILL — OMEPRAZOLE 20 MG CAPSULE,DELAYED RELEASE: 30 days supply | Qty: 30 | Fill #1 | Status: AC

## 2018-10-11 MED FILL — PROGRAF 1 MG CAPSULE: 30 days supply | Qty: 360 | Fill #4

## 2018-10-11 MED FILL — ULTICARE PEN NEEDLE 32 GAUGE X 5/32": 25 days supply | Qty: 100 | Fill #6 | Status: AC

## 2018-10-11 MED FILL — MYFORTIC 180 MG TABLET,DELAYED RELEASE: 30 days supply | Qty: 180 | Fill #4

## 2018-10-11 MED FILL — MYFORTIC 180 MG TABLET,DELAYED RELEASE: 30 days supply | Qty: 180 | Fill #4 | Status: AC

## 2018-10-11 MED FILL — PROGRAF 1 MG CAPSULE: 30 days supply | Qty: 360 | Fill #4 | Status: AC

## 2018-10-11 MED FILL — METOPROLOL TARTRATE 50 MG TABLET: 30 days supply | Qty: 90 | Fill #1

## 2018-10-11 MED FILL — ULTICARE PEN NEEDLE 32 GAUGE X 5/32" (4 MM): 25 days supply | Qty: 100 | Fill #6

## 2018-10-11 MED FILL — METOPROLOL TARTRATE 50 MG TABLET: 30 days supply | Qty: 90 | Fill #1 | Status: AC

## 2018-10-11 MED FILL — OMEPRAZOLE 20 MG CAPSULE,DELAYED RELEASE: ORAL | 30 days supply | Qty: 30 | Fill #1

## 2018-10-17 NOTE — Unmapped (Signed)
Returned call to patient about upcoming apt.  Let him know that we are working on getting apts scheduled.  Patient reports he is doing well and has been feeling good.  Does reports a little congestion but denies fever, SOB, fatigue, abdominal pain, n.v.d, urinary symptoms, or swelling.  Instructed him to let me know if symptoms worsened.  He verbalized understanding.    Let patient know we will also get his [place] and chest xray scheduled and he should get labs at least 1 week prior to apt. He verbalized understanding.    Denies any other needs at this time

## 2018-10-23 MED FILL — LANTUS SOLOSTAR U-100 INSULIN 100 UNIT/ML (3 ML) SUBCUTANEOUS PEN: 90 days supply | Qty: 15 | Fill #0 | Status: AC

## 2018-10-25 ENCOUNTER — Ambulatory Visit: Payer: Medicare Other | Admitting: Podiatry

## 2018-10-31 ENCOUNTER — Encounter: Admit: 2018-10-31 | Discharge: 2018-11-01 | Payer: MEDICARE

## 2018-10-31 ENCOUNTER — Ambulatory Visit: Admit: 2018-10-31 | Discharge: 2018-11-01 | Payer: MEDICARE

## 2018-10-31 DIAGNOSIS — Z940 Kidney transplant status: Principal | ICD-10-CM

## 2018-10-31 DIAGNOSIS — R93421 Abnormal radiologic findings on diagnostic imaging of right kidney: Secondary | ICD-10-CM | POA: Diagnosis not present

## 2018-10-31 DIAGNOSIS — Z4822 Encounter for aftercare following kidney transplant: Secondary | ICD-10-CM | POA: Diagnosis not present

## 2018-10-31 DIAGNOSIS — N271 Small kidney, bilateral: Secondary | ICD-10-CM | POA: Diagnosis not present

## 2018-10-31 DIAGNOSIS — R93422 Abnormal radiologic findings on diagnostic imaging of left kidney: Secondary | ICD-10-CM | POA: Diagnosis not present

## 2018-11-01 NOTE — Unmapped (Signed)
[facility] Refill Coordination Note    Specialty Medication(s) to be Shipped:   Transplant: Myfortic 180mg  and Prograf 1mg     Other medication(s) to be shipped:   Matoprolol 50mg   Omeprazole 20mg      Benjamin Maldonado, DOB: 1975-12-28  Phone: [phone] (home)       All above HIPAA information was verified with patient.     Completed refill call assessment today to schedule patient's medication shipment from the [facility] [phone]).       Specialty medication(s) and dose(s) confirmed: Regimen is correct and unchanged.   Changes to medications: Benjamin Maldonado reports no changes at this time.  Changes to insurance: No  Questions for the pharmacist: No    Confirmed patient received Welcome Packet with first shipment. The patient will receive a drug information handout for each medication shipped and additional FDA Medication Guides as required.       DISEASE/MEDICATION-SPECIFIC INFORMATION        N/A    SPECIALTY MEDICATION ADHERENCE     Medication Adherence    Patient reported X missed doses in the last month: 0  Adherence tools used: patient uses a pill box to manage medications  Support network for adherence: family member         Myfortic 180mg : 10 days on hand   Prograf 1mg : 10 days on hand          SHIPPING     Shipping address confirmed in Epic.     Delivery Scheduled: Yes, Expected medication delivery date: 11/08/18.     Medication will be delivered via UPS to the home address in Epic [place].    Benjamin Maldonado   [facility] Specialty Technician

## 2018-11-01 NOTE — Unmapped (Signed)
Called patient for his pre-clinic eval.  Patient reports he is doing good.  Recently saw his PCP for his left middle finger, it was getting stuck when I tried to straighten it out.  Patient told to try an OTC cream (cannot remember name) and tylenol and if not better in 2 weeks will see hand surgeon.    Denies HA, CP, SOB, abdominal pain, n/v/d, urinary problems, swelling, or fevers.    Had his [place] and Chest xray but has not gotten labs as requested but will try to go Monday. States he did have labs in [place] and April with local nephrologist.  Asked TPA to get labs.      Reports his A1C is better at 6.9.  avg BS 180's.  Denies any lows and some highs when he has forgotten to take his insulin. Discussed importance of managing sugars.    Discussed staying healthy and COVID precautions.    Denies med changes or need for refills    Denies any specific concerns for today's visit

## 2018-11-02 ENCOUNTER — Encounter: Admit: 2018-11-02 | Discharge: 2018-11-03 | Payer: MEDICARE | Attending: Nephrology | Primary: Nephrology

## 2018-11-02 DIAGNOSIS — Z48298 Encounter for aftercare following other organ transplant: Secondary | ICD-10-CM

## 2018-11-02 DIAGNOSIS — Z940 Kidney transplant status: Principal | ICD-10-CM

## 2018-11-02 DIAGNOSIS — D899 Disorder involving the immune mechanism, unspecified: Secondary | ICD-10-CM

## 2018-11-02 DIAGNOSIS — Z794 Long term (current) use of insulin: Secondary | ICD-10-CM | POA: Diagnosis not present

## 2018-11-02 DIAGNOSIS — E119 Type 2 diabetes mellitus without complications: Secondary | ICD-10-CM | POA: Diagnosis not present

## 2018-11-02 DIAGNOSIS — Z79899 Other long term (current) drug therapy: Secondary | ICD-10-CM | POA: Diagnosis not present

## 2018-11-02 DIAGNOSIS — I2510 Atherosclerotic heart disease of native coronary artery without angina pectoris: Secondary | ICD-10-CM | POA: Diagnosis not present

## 2018-11-02 DIAGNOSIS — Z7982 Long term (current) use of aspirin: Secondary | ICD-10-CM | POA: Diagnosis not present

## 2018-11-02 DIAGNOSIS — E785 Hyperlipidemia, unspecified: Secondary | ICD-10-CM | POA: Diagnosis not present

## 2018-11-02 DIAGNOSIS — K219 Gastro-esophageal reflux disease without esophagitis: Secondary | ICD-10-CM | POA: Diagnosis not present

## 2018-11-02 MED FILL — ULTICARE PEN NEEDLE 32 GAUGE X 5/32": 25 days supply | Qty: 100 | Fill #7 | Status: AC

## 2018-11-02 MED FILL — NOVOLOG FLEXPEN U-100 INSULIN ASPART 100 UNIT/ML (3 ML) SUBCUTANEOUS: 55 days supply | Qty: 30 | Fill #2 | Status: AC

## 2018-11-02 MED FILL — ULTICARE PEN NEEDLE 32 GAUGE X 5/32" (4 MM): 25 days supply | Qty: 100 | Fill #7

## 2018-11-02 MED FILL — NOVOLOG FLEXPEN U-100 INSULIN ASPART 100 UNIT/ML (3 ML) SUBCUTANEOUS: 55 days supply | Qty: 30 | Fill #2
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
## 2018-11-07 DIAGNOSIS — Z0000 Encounter for general adult medical examination without abnormal findings: Secondary | ICD-10-CM | POA: Diagnosis not present

## 2018-11-07 DIAGNOSIS — Z940 Kidney transplant status: Secondary | ICD-10-CM | POA: Diagnosis not present

## 2018-11-07 MED FILL — OMEPRAZOLE 20 MG CAPSULE,DELAYED RELEASE: 30 days supply | Qty: 30 | Fill #2 | Status: AC

## 2018-11-07 MED FILL — MYFORTIC 180 MG TABLET,DELAYED RELEASE: 30 days supply | Qty: 180 | Fill #5 | Status: AC

## 2018-11-07 MED FILL — MYFORTIC 180 MG TABLET,DELAYED RELEASE: 30 days supply | Qty: 180 | Fill #5

## 2018-11-07 MED FILL — OMEPRAZOLE 20 MG CAPSULE,DELAYED RELEASE: ORAL | 30 days supply | Qty: 30 | Fill #2

## 2018-11-07 MED FILL — METOPROLOL TARTRATE 50 MG TABLET: 30 days supply | Qty: 90 | Fill #2

## 2018-11-07 MED FILL — PROGRAF 1 MG CAPSULE: 30 days supply | Qty: 360 | Fill #5 | Status: AC

## 2018-11-07 MED FILL — PROGRAF 1 MG CAPSULE: 30 days supply | Qty: 360 | Fill #5

## 2018-11-07 MED FILL — METOPROLOL TARTRATE 50 MG TABLET: 30 days supply | Qty: 90 | Fill #2 | Status: AC

## 2018-11-08 LAB — LIPID PANEL
CHOLESTEROL, TOTAL: 164 mg/dL (ref 100–199)
HDL CHOLESTEROL: 42 mg/dL
LDL CHOLESTEROL CALCULATED: 92 mg/dL (ref 0–99)
LDL/HDL RATIO: 2.2 ratio (ref 0.0–3.6)
TRIGLYCERIDES: 150 mg/dL — ABNORMAL HIGH (ref 0–149)

## 2018-11-08 LAB — CBC W/ DIFFERENTIAL
BANDED NEUTROPHILS ABSOLUTE COUNT: 0 10*3/uL (ref 0.0–0.1)
BASOPHILS ABSOLUTE COUNT: 0 10*3/uL (ref 0.0–0.2)
BASOPHILS RELATIVE PERCENT: 0 %
EOSINOPHILS ABSOLUTE COUNT: 0 10*3/uL (ref 0.0–0.4)
EOSINOPHILS RELATIVE PERCENT: 0 %
HEMATOCRIT: 42.3 % (ref 37.5–51.0)
IMMATURE GRANULOCYTES: 0 %
LYMPHOCYTES ABSOLUTE COUNT: 1.1 10*3/uL (ref 0.7–3.1)
LYMPHOCYTES RELATIVE PERCENT: 10 %
MEAN CORPUSCULAR HEMOGLOBIN CONC: 31 g/dL — ABNORMAL LOW (ref 31.5–35.7)
MEAN CORPUSCULAR HEMOGLOBIN: 25.8 pg — ABNORMAL LOW (ref 26.6–33.0)
MEAN CORPUSCULAR VOLUME: 83 fL (ref 79–97)
MONOCYTES RELATIVE PERCENT: 7 %
NEUTROPHILS ABSOLUTE COUNT: 9.3 10*3/uL — ABNORMAL HIGH (ref 1.4–7.0)
NEUTROPHILS RELATIVE PERCENT: 83 %
PLATELET COUNT: 193 10*3/uL (ref 150–450)
RED BLOOD CELL COUNT: 5.07 x10E6/uL (ref 4.14–5.80)
WHITE BLOOD CELL COUNT: 11.2 10*3/uL — ABNORMAL HIGH (ref 3.4–10.8)

## 2018-11-08 LAB — BASIC METABOLIC PANEL
BLOOD UREA NITROGEN: 30 mg/dL — ABNORMAL HIGH (ref 6–24)
CALCIUM: 10.4 mg/dL — ABNORMAL HIGH (ref 8.7–10.2)
CO2: 22 mmol/L (ref 20–29)
CREATININE: 1.5 mg/dL — ABNORMAL HIGH (ref 0.76–1.27)
GFR MDRD AF AMER: 65 mL/min/{1.73_m2}
GFR MDRD NON AF AMER: 57 mL/min/{1.73_m2} — ABNORMAL LOW
GLUCOSE: 133 mg/dL — ABNORMAL HIGH (ref 65–99)
POTASSIUM: 4.2 mmol/L (ref 3.5–5.2)
SODIUM: 142 mmol/L (ref 134–144)

## 2018-11-08 LAB — UROBILINOGEN UA: Urobilinogen:MCnc:Pt:Urine:Qn:Test strip: 0.2

## 2018-11-08 LAB — URINALYSIS
BILIRUBIN UA: NEGATIVE
GLUCOSE UA: NEGATIVE
LEUKOCYTE ESTERASE UA: NEGATIVE
NITRITE UA: NEGATIVE
PH UA: 6 (ref 5.0–7.5)
SPECIFIC GRAVITY UA: 1.023 (ref 1.005–1.030)
UROBILINOGEN UA: 0.2 mg/dL (ref 0.2–1.0)

## 2018-11-08 LAB — CASTS: Casts:PrThr:Pt:Urine sed:Ord:Microscopy.light: NONE SEEN

## 2018-11-08 LAB — CHLORIDE: Chloride:SCnc:Pt:Ser/Plas:Qn:: 102

## 2018-11-08 LAB — VITAMIN D 1,25-DIHYDROXY: Calcitriol:MCnc:Pt:Ser/Plas:Qn:: 42.4

## 2018-11-08 LAB — HDL CHOLESTEROL: Cholesterol.in HDL:MCnc:Pt:Ser/Plas:Qn:: 42

## 2018-11-08 LAB — HEMOGLOBIN A1C: Hemoglobin A1c/Hemoglobin.total:MFr:Pt:Bld:Qn:: 8.4 — ABNORMAL HIGH

## 2018-11-08 LAB — MAGNESIUM: Magnesium:MCnc:Pt:Ser/Plas:Qn:: 1.7

## 2018-11-08 LAB — WHITE BLOOD CELL COUNT: Leukocytes:NCnc:Pt:Bld:Qn:Automated count: 11.2 — ABNORMAL HIGH

## 2018-11-08 LAB — MICROSCOPIC EXAMINATION: BACTERIA: NONE SEEN

## 2018-11-08 LAB — VITAMIN D 25-HYDROXY: 25-Hydroxyvitamin D2+25-Hydroxyvitamin D3:MCnc:Pt:Ser/Plas:Qn:: 14.2 — ABNORMAL LOW

## 2018-11-08 LAB — PHOSPHORUS, SERUM: Phosphate:MCnc:Pt:Ser/Plas:Qn:: 2.9

## 2018-11-08 LAB — PARATHYROID HORMONE INTACT: Parathyrin.intact:MCnc:Pt:Ser/Plas:Qn:: 64

## 2018-11-09 LAB — LOG10 EBV DNA QN PCR

## 2018-11-09 LAB — BK BLOOD LOG(10)

## 2018-11-09 LAB — EBV QUANTITATIVE PCR, BLOOD

## 2018-11-09 LAB — BK VIRUS QUANTITATIVE PCR, BLOOD: BK BLOOD QUANT: NEGATIVE {copies}/mL

## 2018-11-12 LAB — TACROLIMUS BLOOD: Tacrolimus:MCnc:Pt:Bld:Qn:LC/MS/MS: 1.9 — ABNORMAL LOW

## 2018-11-13 NOTE — Unmapped (Signed)
Prograf level 1.9; patient state he missed a day dose. Advised him to resume his prescribe dose and missing it can cause kidney rejection.

## 2018-11-14 ENCOUNTER — Ambulatory Visit: Payer: Medicare Other | Admitting: Podiatry

## 2018-11-14 NOTE — Unmapped (Signed)
Patient called to ask what flavored water drinks were good for him to drink.    Went over safe water flavors.  Reports he is trying to loose weight.  Encouraged him to eat a whole heart healthy diet, avoiding greasy and processed foods.    Denies any other needs at this time.

## 2018-11-15 ENCOUNTER — Ambulatory Visit (INDEPENDENT_AMBULATORY_CARE_PROVIDER_SITE_OTHER): Payer: Medicare Other

## 2018-11-15 ENCOUNTER — Encounter: Payer: Self-pay | Admitting: Podiatry

## 2018-11-15 ENCOUNTER — Other Ambulatory Visit: Payer: Self-pay

## 2018-11-15 ENCOUNTER — Ambulatory Visit (INDEPENDENT_AMBULATORY_CARE_PROVIDER_SITE_OTHER): Payer: Medicare Other | Admitting: Podiatry

## 2018-11-15 VITALS — Temp 96.9°F

## 2018-11-15 DIAGNOSIS — E08621 Diabetes mellitus due to underlying condition with foot ulcer: Secondary | ICD-10-CM | POA: Diagnosis not present

## 2018-11-15 DIAGNOSIS — E1142 Type 2 diabetes mellitus with diabetic polyneuropathy: Secondary | ICD-10-CM | POA: Diagnosis not present

## 2018-11-15 DIAGNOSIS — R238 Other skin changes: Secondary | ICD-10-CM

## 2018-11-15 DIAGNOSIS — L03115 Cellulitis of right lower limb: Secondary | ICD-10-CM | POA: Diagnosis not present

## 2018-11-15 DIAGNOSIS — M79675 Pain in left toe(s): Secondary | ICD-10-CM | POA: Diagnosis not present

## 2018-11-15 DIAGNOSIS — F5221 Male erectile disorder: Secondary | ICD-10-CM | POA: Diagnosis not present

## 2018-11-15 DIAGNOSIS — M79674 Pain in right toe(s): Secondary | ICD-10-CM

## 2018-11-15 DIAGNOSIS — L97511 Non-pressure chronic ulcer of other part of right foot limited to breakdown of skin: Secondary | ICD-10-CM | POA: Diagnosis not present

## 2018-11-15 DIAGNOSIS — L03119 Cellulitis of unspecified part of limb: Secondary | ICD-10-CM

## 2018-11-15 DIAGNOSIS — B351 Tinea unguium: Secondary | ICD-10-CM | POA: Diagnosis not present

## 2018-11-15 DIAGNOSIS — L02619 Cutaneous abscess of unspecified foot: Secondary | ICD-10-CM

## 2018-11-15 DIAGNOSIS — E1129 Type 2 diabetes mellitus with other diabetic kidney complication: Secondary | ICD-10-CM | POA: Diagnosis not present

## 2018-11-15 DIAGNOSIS — R351 Nocturia: Secondary | ICD-10-CM | POA: Diagnosis not present

## 2018-11-15 NOTE — Patient Instructions (Addendum)
DRESSING CHANGES RIGHT FOOT:  WEAR SURGICAL SHOE AT ALL TIMES    1. KEEP RIGHT  FOOT DRY AT ALL TIMES!!!!  2. CLEANSE ULCER WITH SALINE.  3. DAB DRY WITH GAUZE SPONGE.  4. APPLY A LIGHT AMOUNT OF MUPIRICIN OINTMENT TO BASE OF ULCER.  5. APPLY OUTER DRESSING/BAND-AID AS INSTRUCTED.  6. WEAR SURGICAL SHOE DAILY AT ALL TIMES.  7. DO NOT WALK BAREFOOT!!!  8.  IF YOU EXPERIENCE ANY FEVER, CHILLS, NIGHTSWEATS, NAUSEA OR VOMITING, ELEVATED OR LOW BLOOD SUGARS, REPORT TO EMERGENCY ROOM.  9. IF YOU EXPERIENCE INCREASED REDNESS, PAIN, SWELLING, DISCOLORATION, ODOR, PUS, DRAINAGE OR WARMTH OF YOUR FOOT, REPORT TO EMERGENCY ROOM.

## 2018-11-16 ENCOUNTER — Telehealth: Payer: Self-pay | Admitting: *Deleted

## 2018-11-16 MED ORDER — AMOXICILLIN-POT CLAVULANATE 500-125 MG PO TABS: 1.0000 | ORAL_TABLET | Freq: Three times a day (TID) | ORAL | 0 refills | Status: AC

## 2018-11-16 MED ORDER — MUPIROCIN 2 % EX OINT: TOPICAL_OINTMENT | CUTANEOUS | 0 refills | Status: AC

## 2018-11-16 NOTE — Telephone Encounter (Signed)
-----   Message from Marzetta Board, DPM sent at 11/16/2018  3:37 AM EDT ----- Regarding: Prescriptions Good morning!  Can you please let Mr. Kirchner know his prescriptions have been sent to the pharmacy for Augmentin 500 mg and Mupirocin Ointment?  Augmentin to be taken every 8 hours after meals.  Clean right foot with saline. Apply Mupirocin Ointment to right foot wound once daily and apply clean dressing. Keep right foot dry at all times.  He will see Dr. March Rummage on next week.  Thanks!

## 2018-11-16 NOTE — Progress Notes (Signed)
Subjective: Patient presents today with diabetes and cc of painful, discolored, thick toenails which interfere with daily activities. Pain is aggravated when wearing enclosed shoe gear. Pain is getting progressively worse and relieved with periodic professional debridement.  Anthony Blanchard new concern of both feet. He states he went to the beach on 4th of July weekend and walked barefoot in the hot sand with his kids. He states the sand was very hot and he immediately left the beach and went back to his hotel room.   He noticed blisters of both great toes soon afterwards. Blisters have since healed.  Today, he Blanchard blister along lateral side of his right foot. Duration unknown. He states it may be from another pair of shoes.   He denies any fever, chills, nightsweats, nausea or vomiting.  Tisovec, Fransico Him, MD is his PCP.   Allergies  Allergen Reactions  . Shellfish-Derived Products Anaphylaxis  . Iodine Other (See Comments)     Objective: Vitals:   11/15/18 1453  Temp: (!) 96.9 F (36.1 C)    Vascular Examination: Capillary refill time less than 3 seconds x 10 digits.  Dorsalis pedis pulses palpable b/l.  Posterior tibial pulses palpable b/l.  Digital hair absent x 10 digits.  Skin temperature gradient WNL b/l.  Dermatological Examination: Skin thin, shiny and atrophic b/l.  Toenails 1-5 b/l discolored, thick, dystrophic with subungual debris and pain with palpation to nailbeds due to thickness of nails.        Bulla noted lateral aspect of right foot. He has a plantar callus noted submet head 5 and this appears to be connected to his callus. There is flocculence. No drainage.  Post incision, reveals moderate amount of seropurulent drainage from bulla.      Post incision and drainage right foot measurements today are: 5.0 x 4.5 x 0.1 cm to level of subcutaneous tissue.  No tracking, no tunneling, no probing to bone.  Musculoskeletal: Muscle strength 5/5  to all LE muscle groups  Neurological: Sensation diminished with 10 gram monofilament.  Xrays right foot reveal no evidence for foreign body. No gas in tissues. +Vessel calcification. No bone erosion noted 5th metatarsal.   Assessment: 1. Painful onychomycosis toenails 1-5 b/l 2. Cellulitis/Abscess right foot 3.   Diabetic Ulceration right foot 4.   NIDDM with peripheral neuropathy  Plan: 1. Toenails 1-5 b/l were debrided in length and girth without iatrogenic bleeding. 2. Chlorexadine prep administered to right foot. Incision and drainage of abscess performed. Infected skin removed from foot. Wound cleansed with wound cleanser. Silvadene cream applied to base of wound.  3. He is to apply Mupirocin Ointment to wound once daily with light dressing. 4. Xray right foot performed and reviewed with patient.  5. Surgical shoe was dispensed for right foot. Keep right foot dry.   6. Rx sent for Augmentin 500/125 mg po tid x 10 days. 7. Patient was given instructions on offloading and dressing change/aftercare and was instructed to call immediately if any signs or symptoms of infection arise.  8. Patient instructed to report to emergency department with worsening appearance of ulcer/toe/foot, increased pain, foul odor, increased redness, swelling, drainage, fever, chills, nightsweats, nausea, vomiting, increased blood sugar.  9. Patient/POA related understanding. 10. Follow up one week with Dr. March Rummage. 11. Patient/POA to call should there be a concern in the interim.

## 2018-11-16 NOTE — Telephone Encounter (Signed)
I called pt and informed of Dr. Heber [place] 11/16/2018 3:37am orders.

## 2018-11-18 LAB — WOUND CULTURE
MICRO NUMBER:: 717006
RESULT:: NO GROWTH
SPECIMEN QUALITY:: ADEQUATE

## 2018-11-23 ENCOUNTER — Other Ambulatory Visit: Payer: Self-pay

## 2018-11-23 ENCOUNTER — Encounter: Payer: Self-pay | Admitting: Podiatry

## 2018-11-23 ENCOUNTER — Ambulatory Visit (INDEPENDENT_AMBULATORY_CARE_PROVIDER_SITE_OTHER): Payer: Medicare Other | Admitting: Podiatry

## 2018-11-23 VITALS — Temp 97.3°F

## 2018-11-23 DIAGNOSIS — E08621 Diabetes mellitus due to underlying condition with foot ulcer: Secondary | ICD-10-CM | POA: Diagnosis not present

## 2018-11-23 DIAGNOSIS — L97511 Non-pressure chronic ulcer of other part of right foot limited to breakdown of skin: Secondary | ICD-10-CM | POA: Diagnosis not present

## 2018-11-23 MED ORDER — SILVER SULFADIAZINE 1 % EX CREA: TOPICAL_CREAM | CUTANEOUS | 0 refills | Status: DC

## 2018-11-23 NOTE — Progress Notes (Signed)
Subjective:  Patient ID: Anthony Blanchard, male    DOB: 12/25/1975,  MRN: [id number]  Chief Complaint  Patient presents with  . Foot Ulcer    R foot lateral follow up; "doing better; no new concerns"    43 y.o. male presents for wound care. States the wound is doing better applying iodosorb not having pain or concerns.  Review of Systems: Negative except as noted in the HPI. Denies N/V/F/Ch.  Past Medical History:  Diagnosis Date  . Chronic kidney disease   . Diabetes mellitus without complication ([place])   . GERD (gastroesophageal reflux disease)   . Glaucoma   . Hypertension   . Overweight   . Partial blindness     Current Outpatient Medications:  .  amoxicillin-clavulanate (AUGMENTIN) 500-125 MG tablet, Take 1 tablet (500 mg total) by mouth 3 (three) times daily for 10 days., Disp: 30 tablet, Rfl: 0 .  aspirin EC 81 MG tablet, Take 81 mg by mouth., Disp: , Rfl:  .  Blood Glucose Monitoring Suppl (FIFTY50 GLUCOSE METER 2.0) w/Device KIT, Use as instructed, Disp: , Rfl:  .  Cholecalciferol (VITAMIN D) 2000 units tablet, Take by mouth., Disp: , Rfl:  .  dorzolamide-timolol (COSOPT) 22.3-6.8 MG/ML ophthalmic solution, INSTILL 1 DROP INTO LEFT EYE TWICE DAILY, Disp: , Rfl:  .  insulin glargine (LANTUS) 100 UNIT/ML injection, Inject 13 Units into the skin at bedtime., Disp: , Rfl:  .  insulin lispro (HUMALOG) 100 UNIT/ML KiwkPen, Give 4 units before breakfast, 8 units before lunch and 6 units at dinner . add 2 units if >200, Disp: , Rfl:  .  magnesium oxide (MAG-OX) 400 MG tablet, Take 400 mg by mouth daily., Disp: , Rfl:  .  mupirocin ointment (BACTROBAN) 2 %, Apply to right foot wound once daily with light dressing., Disp: 22 g, Rfl: 0 .  mycophenolate (MYFORTIC) 180 MG EC tablet, TAKE 3 TABLETS (540 MG) BY MOUTH TWICE DAILY, Disp: , Rfl:  .  NOVOLOG FLEXPEN 100 UNIT/ML FlexPen, , Disp: , Rfl:  .  tacrolimus (PROGRAF) 1 MG capsule, TAKE 6 CAPSULES (6 MG TOTAL) BY MOUTH EVERY TWELVE  (12) HOURS., Disp: , Rfl:  .  atorvastatin (LIPITOR) 20 MG tablet, Take 20 mg by mouth., Disp: , Rfl:  .  metoprolol tartrate (LOPRESSOR) 50 MG tablet, Take 75 mg by mouth 2 (two) times daily. , Disp: , Rfl:  .  omeprazole (PRILOSEC) 20 MG capsule, Take 20 mg by mouth., Disp: , Rfl:   Social History   Tobacco Use  Smoking Status Never Smoker  Smokeless Tobacco Never Used    Allergies  Allergen Reactions  . Shellfish-Derived Products Anaphylaxis  . Iodine Other (See Comments)   Objective:   Vitals:   11/23/18 1006  Temp: (!) 97.3 F (36.3 C)   There is no height or weight on file to calculate BMI. Constitutional Well developed. Well nourished.  Vascular Dorsalis pedis pulses palpable bilaterally. Posterior tibial pulses palpable bilaterally. Capillary refill normal to all digits.  No cyanosis or clubbing noted. Pedal hair growth normal.  Neurologic Normal speech. Oriented to person, place, and time. Protective sensation absent  Dermatologic Wound lateral right foot without drainage, fibrogranular base, dumbbell shaped. No signs of acute infection.  Orthopedic: No pain to palpation either foot.   Radiographs: none Assessment:   1. Diabetic ulcer of right foot associated with diabetes mellitus due to underlying condition, limited to breakdown of skin, unspecified part of foot ([place])    Plan:  Patient was evaluated and treated and all questions answered.  Ulcer Right lateral foot. -Improving. -Culture reviewed no need for continued Abx -F/u in 2 weeks for recheck. -Advised to call should worsening or signs of infection occur.  No follow-ups on file.

## 2018-11-27 DIAGNOSIS — H401131 Primary open-angle glaucoma, bilateral, mild stage: Secondary | ICD-10-CM | POA: Diagnosis not present

## 2018-11-27 DIAGNOSIS — H25042 Posterior subcapsular polar age-related cataract, left eye: Secondary | ICD-10-CM | POA: Diagnosis not present

## 2018-11-27 DIAGNOSIS — E119 Type 2 diabetes mellitus without complications: Secondary | ICD-10-CM | POA: Diagnosis not present

## 2018-11-30 NOTE — Unmapped (Signed)
[facility] Refill Coordination Note    Specialty Medication(s) to be Shipped:   Transplant: Myfortic 180mg  and Prograf 1mg     Other medication(s) to be shipped: [employer],     Benjamin Maldonado, DOB: 20-Nov-1975  Phone: [phone] (home)       All above HIPAA information was verified with patient.     Completed refill call assessment today to schedule patient's medication shipment from the [facility] [phone]).       Specialty medication(s) and dose(s) confirmed: Regimen is correct and unchanged.   Changes to medications: Benjamin Maldonado reports no changes at this time.  Changes to insurance: No  Questions for the pharmacist: No    Confirmed patient received Welcome Packet with first shipment. The patient will receive a drug information handout for each medication shipped and additional FDA Medication Guides as required.       DISEASE/MEDICATION-SPECIFIC INFORMATION        N/A    SPECIALTY MEDICATION ADHERENCE     Medication Adherence    Patient reported X missed doses in the last month: 0  Specialty Medication: Myfortic 180mg   Patient is on additional specialty medications: Yes  Additional Specialty Medications: Prograf 1mg   Patient Reported Additional Medication X Missed Doses in the Last Month: 0  Patient is on more than two specialty medications: No  Adherence tools used: patient uses a pill box to manage medications  Support network for adherence: family member                Prograf 1 mg: 7 days of medicine on hand   Myfortic 180 mg: 7 days of medicine on hand       SHIPPING     Shipping address confirmed in Epic.     Delivery Scheduled: Yes, Expected medication delivery date: 08/17.     Medication will be delivered via UPS to the home address in Epic WAM.    Benjamin Maldonado   [facility] Specialty Technician

## 2018-12-01 MED FILL — OMEPRAZOLE 20 MG CAPSULE,DELAYED RELEASE: 30 days supply | Qty: 30 | Fill #3 | Status: AC

## 2018-12-01 MED FILL — PROGRAF 1 MG CAPSULE: 30 days supply | Qty: 360 | Fill #6

## 2018-12-01 MED FILL — ULTICARE PEN NEEDLE 32 GAUGE X 5/32": 25 days supply | Qty: 100 | Fill #8 | Status: AC

## 2018-12-01 MED FILL — PROGRAF 1 MG CAPSULE: 30 days supply | Qty: 360 | Fill #6 | Status: AC

## 2018-12-01 MED FILL — MYFORTIC 180 MG TABLET,DELAYED RELEASE: 30 days supply | Qty: 180 | Fill #6

## 2018-12-01 MED FILL — METOPROLOL TARTRATE 50 MG TABLET: 30 days supply | Qty: 90 | Fill #3 | Status: AC

## 2018-12-01 MED FILL — MYFORTIC 180 MG TABLET,DELAYED RELEASE: 30 days supply | Qty: 180 | Fill #6 | Status: AC

## 2018-12-01 MED FILL — METOPROLOL TARTRATE 50 MG TABLET: 30 days supply | Qty: 90 | Fill #3

## 2018-12-01 MED FILL — ULTICARE PEN NEEDLE 32 GAUGE X 5/32" (4 MM): 25 days supply | Qty: 100 | Fill #8

## 2018-12-01 MED FILL — OMEPRAZOLE 20 MG CAPSULE,DELAYED RELEASE: ORAL | 30 days supply | Qty: 30 | Fill #3

## 2018-12-07 ENCOUNTER — Other Ambulatory Visit: Payer: Self-pay

## 2018-12-07 ENCOUNTER — Ambulatory Visit (INDEPENDENT_AMBULATORY_CARE_PROVIDER_SITE_OTHER): Payer: Medicare Other | Admitting: Podiatry

## 2018-12-07 VITALS — Temp 97.6°F

## 2018-12-07 DIAGNOSIS — L03119 Cellulitis of unspecified part of limb: Secondary | ICD-10-CM

## 2018-12-07 DIAGNOSIS — L02619 Cutaneous abscess of unspecified foot: Secondary | ICD-10-CM | POA: Diagnosis not present

## 2018-12-07 DIAGNOSIS — E08621 Diabetes mellitus due to underlying condition with foot ulcer: Secondary | ICD-10-CM | POA: Diagnosis not present

## 2018-12-07 DIAGNOSIS — L97511 Non-pressure chronic ulcer of other part of right foot limited to breakdown of skin: Secondary | ICD-10-CM | POA: Diagnosis not present

## 2018-12-12 MED ORDER — TACROLIMUS 1 MG CAPSULE
ORAL_CAPSULE | Freq: Two times a day (BID) | ORAL | 11 refills | 30.00000 days | Status: CP
Start: 2018-12-12 — End: 2019-12-12
  Filled 2018-12-27: qty 360, 30d supply, fill #0

## 2018-12-15 NOTE — Unmapped (Signed)
[facility] Pharmacist Intervention    Type of intervention: change from brand to generic of NTI drug    Medication: prograf/tac    Problem: clinic ok'ed change to generic    Intervention: clinical call set up for 9/7 to discuss with pt    Follow up needed: see above    Approximate time spent: 10 minutes    [name]   [facility] Specialty Pharmacist

## 2018-12-18 DIAGNOSIS — R52 Pain, unspecified: Secondary | ICD-10-CM | POA: Diagnosis not present

## 2018-12-18 DIAGNOSIS — M65332 Trigger finger, left middle finger: Secondary | ICD-10-CM | POA: Diagnosis not present

## 2018-12-21 DIAGNOSIS — H3582 Retinal ischemia: Secondary | ICD-10-CM | POA: Diagnosis not present

## 2018-12-21 DIAGNOSIS — H44521 Atrophy of globe, right eye: Secondary | ICD-10-CM | POA: Diagnosis not present

## 2018-12-21 DIAGNOSIS — H2512 Age-related nuclear cataract, left eye: Secondary | ICD-10-CM | POA: Diagnosis not present

## 2018-12-21 DIAGNOSIS — E113522 Type 2 diabetes mellitus with proliferative diabetic retinopathy with traction retinal detachment involving the macula, left eye: Secondary | ICD-10-CM | POA: Diagnosis not present

## 2018-12-22 NOTE — Unmapped (Signed)
[facility] Clinical Assessment & Refill Coordination Note    ITHIEL LIEBLER, DOB: Feb 10, 1976  Phone: [phone] (home)     All above HIPAA information was verified with patient.     Specialty Medication(s):   Transplant: Myfortic 180mg , Prograf 1mg  and tacrolimus 1mg        [facility] Pharmacist Intervention    Type of intervention: change from brand to generic of nti drug    Medication: prograf/tac    Problem: clinic oke'd change to generic    Intervention: pt aware and ok with change, aware of labwork needs and to contact clinic on date of switch. I will message clinic today as well    Follow up needed: see above    Approximate time spent: 10 minutes    [name]   [facility] Specialty Pharmacist        Current Outpatient Medications   Medication Sig Dispense Refill   ??? acetaminophen (TYLENOL) 325 MG tablet Take 2 tablets (650 mg total) by mouth every six (6) hours as needed for pain. 100 tablet 2   ??? aspirin (ADULT LOW DOSE ASPIRIN) 81 MG tablet Take 1 tablet (81 mg total) by mouth daily. 30 tablet 11   ??? atorvastatin (LIPITOR) 20 MG tablet Take 1 tablet (20 mg total) by mouth daily. 90 tablet 3   ??? blood sugar diagnostic Strp by Other route Four (4) times a day (before meals and nightly). One Touch 100 strip 5   ??? blood-glucose meter (GLUCOSE MONITORING KIT) kit Use as instructed 1 each 0   ??? GLUC.METER,DIS.P-LOADED STRIPS (GLUCOSE METER, DISP & STRIPS MISC) Frequency:PHARMDIR   Dosage:0.0     Instructions:  Note:250.00, life long Dose: 1     ??? insulin ASPART (NOVOLOG FLEXPEN) 100 unit/mL (3 mL) injection pen Inject 4 units under skin with breakfast, 8 units with lunch, and 6 units with supper and sliding scale. Max units 54/day 30 mL 11   ??? insulin glargine (BASAGLAR, LANTUS) 100 unit/mL (3 mL) injection pen Inject 0.16 mL (16 Units total) under the skin nightly. 15 mL 11   ??? metoprolol tartrate (LOPRESSOR) 50 MG tablet TAKE 1 AND 1/2 TABLETS (75 MG) BY MOUTH TWICE DAILY 90 tablet PRN   ??? MYFORTIC 180 mg EC tablet TAKE 3 TABLETS (540 MG) BY MOUTH TWICE DAILY 180 tablet 11   ??? omeprazole (PRILOSEC) 20 MG capsule TAKE 1 CAPSULE BY MOUTH ONCE DAILY 30 capsule PRN   ??? pen needle, diabetic 32 gauge x 5/32 (4 mm) Ndle USE TO ADMINISTER INSULIN 4 TIMES DAILY 100 each PRN   ??? tacrolimus (PROGRAF) 1 MG capsule Take 6 capsules (6 mg total) by mouth two (2) times a day. 360 capsule 11     No current facility-administered medications for this visit.         Changes to medications: Sriram reports no changes at this time.    Allergies   Allergen Reactions   ??? Iodine Other (See Comments)   ??? Iodine And Iodide Containing Products    ??? Shellfish Containing Products Other (See Comments)       Changes to allergies: No    SPECIALTY MEDICATION ADHERENCE     Myfortic 180mg   : 10 days of medicine on hand   Prograf 1mg   : 10 days of medicine on hand   Tacrolimus 1mg   : 0 days of medicine on hand - will start on this  fill      Medication Adherence    Patient reported X missed doses in the last month: 0  Specialty Medication: myfortic 180mg   Patient is on additional specialty medications: Yes  Additional Specialty Medications: Prograf 1mg   Patient Reported Additional Medication X Missed Doses in the Last Month: 0  Adherence tools used: patient uses a pill box to manage medications  Support network for adherence: family member          Specialty medication(s) dose(s) confirmed: Regimen is correct and unchanged.     Are there any concerns with adherence? No    Adherence counseling provided? Not needed    CLINICAL MANAGEMENT AND INTERVENTION      Clinical Benefit Assessment:    Do you feel the medicine is effective or helping your condition? Yes    Clinical Benefit counseling provided? Not needed    Adverse Effects Assessment:    Are you experiencing any side effects? No    Are you experiencing difficulty administering your medicine? No    Quality of Life Assessment:    How many days over the past month did your transplant  keep you from your normal activities? For example, brushing your teeth or getting up in the morning. 0    Have you discussed this with your provider? Not needed    Therapy Appropriateness:    Is therapy appropriate? Yes, therapy is appropriate and should be continued    DISEASE/MEDICATION-SPECIFIC INFORMATION      N/A    PATIENT SPECIFIC NEEDS     ? Does the patient have any physical, cognitive, or cultural barriers? No    ? Is the patient high risk? Yes, patient taking a REMS drug     ? Does the patient require a Care Management Plan? No     ? Does the patient require physician intervention or other additional services (i.e. nutrition, smoking cessation, social work)? No      SHIPPING     Specialty Medication(s) to be Shipped:   Transplant: Myfortic 180mg  and tacrolimus 1mg     Other medication(s) to be shipped: metoprolol, omeprazole, pen needles, novolog - 6rx total     Changes to insurance: No    Delivery Scheduled: Yes, Expected medication delivery date: 12/28/2018.     Medication will be delivered via UPS to the confirmed home address in [facility].    The patient will receive a drug information handout for each medication shipped and additional FDA Medication Guides as required.  Verified that patient has previously received a Conservation officer, historic buildings.    All of the patient's questions and concerns have been addressed.    [name]   [facility] Specialty Pharmacist

## 2018-12-27 MED FILL — NOVOLOG FLEXPEN U-100 INSULIN ASPART 100 UNIT/ML (3 ML) SUBCUTANEOUS: 55 days supply | Qty: 30 | Fill #3

## 2018-12-27 MED FILL — NOVOLOG FLEXPEN U-100 INSULIN ASPART 100 UNIT/ML (3 ML) SUBCUTANEOUS: 55 days supply | Qty: 30 | Fill #3 | Status: AC

## 2018-12-27 MED FILL — MYFORTIC 180 MG TABLET,DELAYED RELEASE: 30 days supply | Qty: 180 | Fill #7

## 2018-12-27 MED FILL — MYFORTIC 180 MG TABLET,DELAYED RELEASE: 30 days supply | Qty: 180 | Fill #7 | Status: AC

## 2018-12-27 MED FILL — METOPROLOL TARTRATE 50 MG TABLET: 30 days supply | Qty: 90 | Fill #4 | Status: AC

## 2018-12-27 MED FILL — ULTICARE PEN NEEDLE 32 GAUGE X 5/32" (4 MM): 25 days supply | Qty: 100 | Fill #9

## 2018-12-27 MED FILL — ULTICARE PEN NEEDLE 32 GAUGE X 5/32": 25 days supply | Qty: 100 | Fill #9 | Status: AC

## 2018-12-27 MED FILL — OMEPRAZOLE 20 MG CAPSULE,DELAYED RELEASE: ORAL | 30 days supply | Qty: 30 | Fill #4

## 2018-12-27 MED FILL — TACROLIMUS 1 MG CAPSULE: 30 days supply | Qty: 360 | Fill #0 | Status: AC

## 2018-12-27 MED FILL — METOPROLOL TARTRATE 50 MG TABLET: 30 days supply | Qty: 90 | Fill #4

## 2018-12-27 MED FILL — OMEPRAZOLE 20 MG CAPSULE,DELAYED RELEASE: 30 days supply | Qty: 30 | Fill #4 | Status: AC

## 2019-01-04 ENCOUNTER — Ambulatory Visit (INDEPENDENT_AMBULATORY_CARE_PROVIDER_SITE_OTHER): Payer: Medicare Other | Admitting: Podiatry

## 2019-01-04 ENCOUNTER — Other Ambulatory Visit: Payer: Self-pay

## 2019-01-04 DIAGNOSIS — E08621 Diabetes mellitus due to underlying condition with foot ulcer: Secondary | ICD-10-CM

## 2019-01-04 DIAGNOSIS — L97511 Non-pressure chronic ulcer of other part of right foot limited to breakdown of skin: Secondary | ICD-10-CM

## 2019-01-04 MED ORDER — SILVER SULFADIAZINE 1 % EX CREA: TOPICAL_CREAM | CUTANEOUS | 0 refills | Status: DC

## 2019-01-04 NOTE — Progress Notes (Signed)
Subjective:  Patient ID: Anthony Blanchard, male    DOB: 1975/08/25,  MRN: [id number]  Chief Complaint  Patient presents with  . Wound Check    Right foot ulcer. Pt states no new concerns, states it is looking better, denies fever/nausea/vomiting/chills/drainage. Pt states some minor sorenness in proximal tissue.    43 y.o. male presents for wound care. Hx as above.  Review of Systems: Negative except as noted in the HPI. Denies N/V/F/Ch.  Past Medical History:  Diagnosis Date  . Chronic kidney disease   . Diabetes mellitus without complication ([place])   . GERD (gastroesophageal reflux disease)   . Glaucoma   . Hypertension   . Overweight   . Partial blindness     Current Outpatient Medications:  .  aspirin EC 81 MG tablet, Take 81 mg by mouth., Disp: , Rfl:  .  Blood Glucose Monitoring Suppl (FIFTY50 GLUCOSE METER 2.0) w/Device KIT, Use as instructed, Disp: , Rfl:  .  Cholecalciferol (VITAMIN D) 2000 units tablet, Take by mouth., Disp: , Rfl:  .  dorzolamide-timolol (COSOPT) 22.3-6.8 MG/ML ophthalmic solution, INSTILL 1 DROP INTO LEFT EYE TWICE DAILY, Disp: , Rfl:  .  insulin glargine (LANTUS) 100 UNIT/ML injection, Inject 13 Units into the skin at bedtime., Disp: , Rfl:  .  insulin lispro (HUMALOG) 100 UNIT/ML KiwkPen, Give 4 units before breakfast, 8 units before lunch and 6 units at dinner . add 2 units if >200, Disp: , Rfl:  .  magnesium oxide (MAG-OX) 400 MG tablet, Take 400 mg by mouth daily., Disp: , Rfl:  .  mycophenolate (MYFORTIC) 180 MG EC tablet, TAKE 3 TABLETS (540 MG) BY MOUTH TWICE DAILY, Disp: , Rfl:  .  NOVOLOG FLEXPEN 100 UNIT/ML FlexPen, , Disp: , Rfl:  .  silver sulfADIAZINE (SILVADENE) 1 % cream, Apply pea-sized amount to wound daily., Disp: 50 g, Rfl: 0 .  tacrolimus (PROGRAF) 1 MG capsule, TAKE 6 CAPSULES (6 MG TOTAL) BY MOUTH EVERY TWELVE (12) HOURS., Disp: , Rfl:  .  ULTICARE MICRO PEN NEEDLES 32G X 4 MM MISC, , Disp: , Rfl:  .  atorvastatin (LIPITOR) 20 MG  tablet, Take 20 mg by mouth., Disp: , Rfl:  .  metoprolol tartrate (LOPRESSOR) 50 MG tablet, Take 75 mg by mouth 2 (two) times daily. , Disp: , Rfl:  .  omeprazole (PRILOSEC) 20 MG capsule, Take 20 mg by mouth., Disp: , Rfl:   Social History   Tobacco Use  Smoking Status Never Smoker  Smokeless Tobacco Never Used    Allergies  Allergen Reactions  . Shellfish-Derived Products Anaphylaxis  . Iodine Other (See Comments)   Objective:   Vitals:   12/07/18 0959  Temp: 97.6 F (36.4 C)   There is no height or weight on file to calculate BMI. Constitutional Well developed. Well nourished.  Vascular Dorsalis pedis pulses palpable bilaterally. Posterior tibial pulses palpable bilaterally. Capillary refill normal to all digits.  No cyanosis or clubbing noted. Pedal hair growth normal.  Neurologic Normal speech. Oriented to person, place, and time. Protective sensation absent  Dermatologic Wound lateral right foot without drainage, erythema. No signs of acute infection.  Orthopedic: No pain to palpation either foot.   Radiographs: none Assessment:   1. Diabetic ulcer of right foot associated with diabetes mellitus due to underlying condition, limited to breakdown of skin, unspecified part of foot ([place])   2. Cellulitis and abscess of foot, except toes    Plan:  Patient was evaluated and treated  and all questions answered.  Ulcer Right lateral foot. -Much improved. No open draining area.  -No signs of infection noted -Apply ointment and band-aid daily. -Advised to call should worsening or signs of infection occur. -F/u in 1 month for recheck.  No follow-ups on file.

## 2019-01-04 NOTE — Progress Notes (Signed)
Subjective:  Patient ID: Anthony Blanchard, male    DOB: 17-Dec-1975,  MRN: [id number]  No chief complaint on file.  43 y.o. male presents for wound care. States the wound is doing better but having a lot of callus. His girlfriend is helping him care for the wound.  Review of Systems: Negative except as noted in the HPI. Denies N/V/F/Ch.  Past Medical History:  Diagnosis Date  . Chronic kidney disease   . Diabetes mellitus without complication ([place])   . GERD (gastroesophageal reflux disease)   . Glaucoma   . Hypertension   . Overweight   . Partial blindness     Current Outpatient Medications:  .  aspirin EC 81 MG tablet, Take 81 mg by mouth., Disp: , Rfl:  .  atorvastatin (LIPITOR) 20 MG tablet, Take 20 mg by mouth., Disp: , Rfl:  .  Blood Glucose Monitoring Suppl (FIFTY50 GLUCOSE METER 2.0) w/Device KIT, Use as instructed, Disp: , Rfl:  .  Cholecalciferol (VITAMIN D) 2000 units tablet, Take by mouth., Disp: , Rfl:  .  dorzolamide-timolol (COSOPT) 22.3-6.8 MG/ML ophthalmic solution, INSTILL 1 DROP INTO LEFT EYE TWICE DAILY, Disp: , Rfl:  .  insulin glargine (LANTUS) 100 UNIT/ML injection, Inject 13 Units into the skin at bedtime., Disp: , Rfl:  .  insulin lispro (HUMALOG) 100 UNIT/ML KiwkPen, Give 4 units before breakfast, 8 units before lunch and 6 units at dinner . add 2 units if >200, Disp: , Rfl:  .  magnesium oxide (MAG-OX) 400 MG tablet, Take 400 mg by mouth daily., Disp: , Rfl:  .  metoprolol tartrate (LOPRESSOR) 50 MG tablet, Take 75 mg by mouth 2 (two) times daily. , Disp: , Rfl:  .  mycophenolate (MYFORTIC) 180 MG EC tablet, TAKE 3 TABLETS (540 MG) BY MOUTH TWICE DAILY, Disp: , Rfl:  .  NOVOLOG FLEXPEN 100 UNIT/ML FlexPen, , Disp: , Rfl:  .  omeprazole (PRILOSEC) 20 MG capsule, Take 20 mg by mouth., Disp: , Rfl:  .  silver sulfADIAZINE (SILVADENE) 1 % cream, Apply pea-sized amount to wound daily., Disp: 50 g, Rfl: 0 .  tacrolimus (PROGRAF) 1 MG capsule, TAKE 6 CAPSULES (6 MG  TOTAL) BY MOUTH EVERY TWELVE (12) HOURS., Disp: , Rfl:  .  ULTICARE MICRO PEN NEEDLES 32G X 4 MM MISC, , Disp: , Rfl:   Social History   Tobacco Use  Smoking Status Never Smoker  Smokeless Tobacco Never Used    Allergies  Allergen Reactions  . Shellfish-Derived Products Anaphylaxis  . Iodine Other (See Comments)   Objective:  There were no vitals filed for this visit. There is no height or weight on file to calculate BMI. Constitutional Well developed. Well nourished.  Vascular Dorsalis pedis pulses palpable bilaterally. Posterior tibial pulses palpable bilaterally. Capillary refill normal to all digits.  No cyanosis or clubbing noted. Pedal hair growth normal.  Neurologic Normal speech. Oriented to person, place, and time. Protective sensation absent  Dermatologic Wound Location: R 5th MPJ Wound Base: Granular/Healthy Peri-wound: Calloused Exudate: None: wound tissue dry Wound Measurements: -0.5x1 post debridement  Orthopedic: No pain to palpation either foot.   Radiographs: None Assessment:   1. Diabetic ulcer of right foot associated with diabetes mellitus due to underlying condition, limited to breakdown of skin, unspecified part of foot ([place])    Plan:  Patient was evaluated and treated and all questions answered.  Ulcer right foot -Debridement as below. -Dressed with silvadene, DSD. -Continue off-loading with pads  Procedure: Excisional Debridement  of Wound Rationale: Removal of non-viable soft tissue from the wound to promote healing.  Anesthesia: none Pre-Debridement Wound Measurements: overlying HPK  Post-Debridement Wound Measurements: 0.5 cm x 1 cm x 0.2 cm  Type of Debridement: Sharp Excisional Tissue Removed: Non-viable soft tissue Depth of Debridement: subcutaneous tissue. Technique: Sharp excisional debridement to bleeding, viable wound base.  Dressing: Dry, sterile, compression dressing. Disposition: Patient tolerated procedure well. Patient  to return in 1 week for follow-up.  No follow-ups on file.

## 2019-01-08 DIAGNOSIS — Z940 Kidney transplant status: Secondary | ICD-10-CM

## 2019-01-08 NOTE — Unmapped (Signed)
Patient called and states he needs new [employer] orders. New orders sent in.    Denies any other needs

## 2019-01-11 DIAGNOSIS — Z940 Kidney transplant status: Secondary | ICD-10-CM | POA: Diagnosis not present

## 2019-01-11 LAB — CBC W/ DIFFERENTIAL
BANDED NEUTROPHILS ABSOLUTE COUNT: 0 10*3/uL (ref 0.0–0.1)
BASOPHILS ABSOLUTE COUNT: 0 10*3/uL (ref 0.0–0.2)
BASOPHILS RELATIVE PERCENT: 0 %
EOSINOPHILS ABSOLUTE COUNT: 0.1 10*3/uL (ref 0.0–0.4)
EOSINOPHILS RELATIVE PERCENT: 1 %
HEMATOCRIT: 42.4 % (ref 37.5–51.0)
HEMOGLOBIN: 13 g/dL (ref 13.0–17.7)
IMMATURE GRANULOCYTES: 0 %
LYMPHOCYTES ABSOLUTE COUNT: 1 10*3/uL (ref 0.7–3.1)
MEAN CORPUSCULAR HEMOGLOBIN CONC: 30.7 g/dL — ABNORMAL LOW (ref 31.5–35.7)
MEAN CORPUSCULAR HEMOGLOBIN: 25.8 pg — ABNORMAL LOW (ref 26.6–33.0)
MEAN CORPUSCULAR VOLUME: 84 fL (ref 79–97)
MONOCYTES ABSOLUTE COUNT: 0.6 10*3/uL (ref 0.1–0.9)
MONOCYTES RELATIVE PERCENT: 8 %
NEUTROPHILS ABSOLUTE COUNT: 6.1 10*3/uL (ref 1.4–7.0)
NEUTROPHILS RELATIVE PERCENT: 78 %
RED BLOOD CELL COUNT: 5.04 x10E6/uL (ref 4.14–5.80)
RED CELL DISTRIBUTION WIDTH: 13.6 % (ref 11.6–15.4)
WHITE BLOOD CELL COUNT: 7.8 10*3/uL (ref 3.4–10.8)

## 2019-01-11 LAB — WHITE BLOOD CELL COUNT: Leukocytes:NCnc:Pt:Bld:Qn:Automated count: 7.8

## 2019-01-12 LAB — BASIC METABOLIC PANEL
BUN / CREAT RATIO: 17 (ref 9–20)
CALCIUM: 9.9 mg/dL (ref 8.7–10.2)
CHLORIDE: 104 mmol/L (ref 96–106)
CREATININE: 1.53 mg/dL — ABNORMAL HIGH (ref 0.76–1.27)
GFR MDRD AF AMER: 64 mL/min/{1.73_m2}
GFR MDRD NON AF AMER: 55 mL/min/{1.73_m2} — ABNORMAL LOW
GLUCOSE: 126 mg/dL — ABNORMAL HIGH (ref 65–99)
POTASSIUM: 5 mmol/L (ref 3.5–5.2)
SODIUM: 139 mmol/L (ref 134–144)

## 2019-01-12 LAB — MAGNESIUM: Magnesium:MCnc:Pt:Ser/Plas:Qn:: 1.8

## 2019-01-12 LAB — CO2: Carbon dioxide:SCnc:Pt:Ser/Plas:Qn:: 19 — ABNORMAL LOW

## 2019-01-12 LAB — PHOSPHORUS, SERUM: Phosphate:MCnc:Pt:Ser/Plas:Qn:: 3.2

## 2019-01-13 LAB — TACROLIMUS BLOOD: Tacrolimus:MCnc:Pt:Bld:Qn:LC/MS/MS: 7.1

## 2019-01-17 NOTE — Unmapped (Signed)
[facility] Refill Coordination Note    Specialty Medication(s) to be Shipped:   Transplant: Myfortic 180mg  and tacrolimus 1mg     Other medication(s) to be shipped: Lantus, pen needles, omeprazole, and metoprolol     Mohammedali L Rabago, DOB: 1975/10/11  Phone: [phone] (home)       All above HIPAA information was verified with patient.     Completed refill call assessment today to schedule patient's medication shipment from the [facility] [phone]).       Specialty medication(s) and dose(s) confirmed: Regimen is correct and unchanged.   Changes to medications: Jaxsin reports no changes at this time.  Changes to insurance: No  Questions for the pharmacist: No    Confirmed patient received Welcome Packet with first shipment. The patient will receive a drug information handout for each medication shipped and additional FDA Medication Guides as required.       DISEASE/MEDICATION-SPECIFIC INFORMATION        N/A    SPECIALTY MEDICATION ADHERENCE     Medication Adherence    Patient reported X missed doses in the last month: 1  Specialty Medication: Tacrolimus 1mg   Patient is on additional specialty medications: Yes  Additional Specialty Medications: Myfortic 180mg   Patient Reported Additional Medication X Missed Doses in the Last Month: 1  Patient is on more than two specialty medications: No  Adherence tools used: patient uses a pill box to manage medications  Support network for adherence: family member          Tacrolimus 1 mg: 10 days of medicine on hand   Myfortic 180 mg: 10 days of medicine on hand     SHIPPING     Shipping address confirmed in Epic.     Delivery Scheduled: Yes, Expected medication delivery date: 01/26/2019.     Medication will be delivered via UPS to the home address in Epic [place].    Oretha Milch   [facility] Specialty Technician

## 2019-01-22 DIAGNOSIS — M65332 Trigger finger, left middle finger: Secondary | ICD-10-CM | POA: Diagnosis not present

## 2019-01-25 MED FILL — ULTICARE PEN NEEDLE 32 GAUGE X 5/32" (4 MM): 25 days supply | Qty: 100 | Fill #10

## 2019-01-25 MED FILL — MYFORTIC 180 MG TABLET,DELAYED RELEASE: 30 days supply | Qty: 180 | Fill #8 | Status: AC

## 2019-01-25 MED FILL — TACROLIMUS 1 MG CAPSULE: 30 days supply | Qty: 360 | Fill #1 | Status: AC

## 2019-01-25 MED FILL — METOPROLOL TARTRATE 50 MG TABLET: 30 days supply | Qty: 90 | Fill #5 | Status: AC

## 2019-01-25 MED FILL — METOPROLOL TARTRATE 50 MG TABLET: 30 days supply | Qty: 90 | Fill #5

## 2019-01-25 MED FILL — TACROLIMUS 1 MG CAPSULE, IMMEDIATE-RELEASE: ORAL | 30 days supply | Qty: 360 | Fill #1

## 2019-01-25 MED FILL — MYFORTIC 180 MG TABLET,DELAYED RELEASE: 30 days supply | Qty: 180 | Fill #8

## 2019-01-25 MED FILL — ULTICARE PEN NEEDLE 32 GAUGE X 5/32": 25 days supply | Qty: 100 | Fill #10 | Status: AC

## 2019-01-25 MED FILL — OMEPRAZOLE 20 MG CAPSULE,DELAYED RELEASE: 30 days supply | Qty: 30 | Fill #5 | Status: AC

## 2019-01-25 MED FILL — LANTUS SOLOSTAR U-100 INSULIN 100 UNIT/ML (3 ML) SUBCUTANEOUS PEN: SUBCUTANEOUS | 90 days supply | Qty: 15 | Fill #1

## 2019-01-25 MED FILL — OMEPRAZOLE 20 MG CAPSULE,DELAYED RELEASE: ORAL | 30 days supply | Qty: 30 | Fill #5

## 2019-01-25 MED FILL — LANTUS SOLOSTAR U-100 INSULIN 100 UNIT/ML (3 ML) SUBCUTANEOUS PEN: 90 days supply | Qty: 15 | Fill #1 | Status: AC

## 2019-01-30 ENCOUNTER — Other Ambulatory Visit: Payer: Self-pay

## 2019-01-30 ENCOUNTER — Ambulatory Visit (INDEPENDENT_AMBULATORY_CARE_PROVIDER_SITE_OTHER): Payer: Medicare Other | Admitting: Podiatry

## 2019-01-30 VITALS — Temp 98.1°F

## 2019-01-30 DIAGNOSIS — E08621 Diabetes mellitus due to underlying condition with foot ulcer: Secondary | ICD-10-CM

## 2019-01-30 DIAGNOSIS — L97511 Non-pressure chronic ulcer of other part of right foot limited to breakdown of skin: Secondary | ICD-10-CM | POA: Diagnosis not present

## 2019-02-05 DIAGNOSIS — Z940 Kidney transplant status: Principal | ICD-10-CM

## 2019-02-06 NOTE — Unmapped (Signed)
Patient called to see if he could get a tattoo to honor his late mother    Let him know he has to make sure the tattoo parlor is clean and risk of blood born pathogens as well as covid restrictions.    Verbalized understanding

## 2019-02-15 NOTE — Unmapped (Signed)
[facility] Refill Coordination Note    Specialty Medication(s) to be Shipped:   Transplant: Myfortic 180mg  and tacrolimus 1mg     Other medication(s) to be shipped: pen needles, omeprazole 20mg , novolog, and metoprolol tart 50mg      Beck L Frankenfield, DOB: 24-May-1975  Phone: [phone] (home)       All above HIPAA information was verified with patient.     Completed refill call assessment today to schedule patient's medication shipment from the [facility] [phone]).       Specialty medication(s) and dose(s) confirmed: Regimen is correct and unchanged.   Changes to medications: Gotham reports no changes at this time.  Changes to insurance: No  Questions for the pharmacist: No    Confirmed patient received Welcome Packet with first shipment. The patient will receive a drug information handout for each medication shipped and additional FDA Medication Guides as required.       DISEASE/MEDICATION-SPECIFIC INFORMATION        N/A    SPECIALTY MEDICATION ADHERENCE     Medication Adherence    Patient reported X missed doses in the last month: 1  Specialty Medication: Myfortic 180mg   Patient is on additional specialty medications: Yes  Additional Specialty Medications: Tacrolimus 1mg   Patient Reported Additional Medication X Missed Doses in the Last Month: 1  Patient is on more than two specialty medications: No  Adherence tools used: patient uses a pill box to manage medications  Support network for adherence: family member          Myfortic 180 mg: 8 days of medicine on hand   Tacrolimus 1 mg: 8 days of medicine on hand     SHIPPING     Shipping address confirmed in Epic.     Delivery Scheduled: Yes, Expected medication delivery date: 02/23/2019.     Medication will be delivered via UPS to the prescription address in Epic [place].    Lorelei Pont [facility] Specialty Technician

## 2019-02-22 ENCOUNTER — Ambulatory Visit (INDEPENDENT_AMBULATORY_CARE_PROVIDER_SITE_OTHER): Payer: Medicare Other | Admitting: Primary Care

## 2019-02-22 ENCOUNTER — Other Ambulatory Visit: Payer: Self-pay

## 2019-02-22 ENCOUNTER — Encounter: Payer: Self-pay | Admitting: Primary Care

## 2019-02-22 VITALS — BP 130/82 | HR 86 | Temp 97.0°F | Ht 69.0 in | Wt 234.2 lb

## 2019-02-22 DIAGNOSIS — Z23 Encounter for immunization: Secondary | ICD-10-CM | POA: Diagnosis not present

## 2019-02-22 DIAGNOSIS — I119 Hypertensive heart disease without heart failure: Secondary | ICD-10-CM | POA: Diagnosis not present

## 2019-02-22 DIAGNOSIS — Z794 Long term (current) use of insulin: Secondary | ICD-10-CM

## 2019-02-22 DIAGNOSIS — I2510 Atherosclerotic heart disease of native coronary artery without angina pectoris: Secondary | ICD-10-CM | POA: Diagnosis not present

## 2019-02-22 DIAGNOSIS — E1169 Type 2 diabetes mellitus with other specified complication: Secondary | ICD-10-CM | POA: Diagnosis not present

## 2019-02-22 DIAGNOSIS — D849 Immunodeficiency, unspecified: Secondary | ICD-10-CM

## 2019-02-22 DIAGNOSIS — I10 Essential (primary) hypertension: Secondary | ICD-10-CM | POA: Diagnosis not present

## 2019-02-22 DIAGNOSIS — Z940 Kidney transplant status: Secondary | ICD-10-CM

## 2019-02-22 DIAGNOSIS — E785 Hyperlipidemia, unspecified: Secondary | ICD-10-CM | POA: Insufficient documentation

## 2019-02-22 LAB — POCT GLYCOSYLATED HEMOGLOBIN (HGB A1C): Hemoglobin A1C: 7.2 % — AB (ref 4.0–5.6)

## 2019-02-22 MED FILL — ULTICARE PEN NEEDLE 32 GAUGE X 5/32" (4 MM): 25 days supply | Qty: 100 | Fill #11

## 2019-02-22 MED FILL — MYFORTIC 180 MG TABLET,DELAYED RELEASE: 30 days supply | Qty: 180 | Fill #9 | Status: AC

## 2019-02-22 MED FILL — TACROLIMUS 1 MG CAPSULE, IMMEDIATE-RELEASE: ORAL | 30 days supply | Qty: 360 | Fill #2

## 2019-02-22 MED FILL — MYFORTIC 180 MG TABLET,DELAYED RELEASE: 30 days supply | Qty: 180 | Fill #9

## 2019-02-22 MED FILL — OMEPRAZOLE 20 MG CAPSULE,DELAYED RELEASE: 30 days supply | Qty: 30 | Fill #6 | Status: AC

## 2019-02-22 MED FILL — ULTICARE PEN NEEDLE 32 GAUGE X 5/32" (4 MM): 25 days supply | Qty: 100 | Fill #11 | Status: AC

## 2019-02-22 MED FILL — OMEPRAZOLE 20 MG CAPSULE,DELAYED RELEASE: ORAL | 30 days supply | Qty: 30 | Fill #6

## 2019-02-22 MED FILL — METOPROLOL TARTRATE 50 MG TABLET: 30 days supply | Qty: 90 | Fill #6

## 2019-02-22 MED FILL — NOVOLOG FLEXPEN U-100 INSULIN ASPART 100 UNIT/ML (3 ML) SUBCUTANEOUS: 55 days supply | Qty: 30 | Fill #4 | Status: AC

## 2019-02-22 MED FILL — NOVOLOG FLEXPEN U-100 INSULIN ASPART 100 UNIT/ML (3 ML) SUBCUTANEOUS: 55 days supply | Qty: 30 | Fill #4

## 2019-02-22 MED FILL — METOPROLOL TARTRATE 50 MG TABLET: 30 days supply | Qty: 90 | Fill #6 | Status: AC

## 2019-02-22 MED FILL — TACROLIMUS 1 MG CAPSULE: 30 days supply | Qty: 360 | Fill #2 | Status: AC

## 2019-02-22 NOTE — Patient Instructions (Addendum)
You will be contacted regarding your referral to cardiology.  Please let us know if you have not been contacted within two weeks.   Inject Novolog insulin before meals for blood sugars above 100. Increase your breakfast time insulin to 8 units. Continue 8 units before lunch and 6 units before dinner.  Continue checking your blood sugar levels.  Appropriate times to check your blood sugar levels are:  -Before any meal (breakfast, lunch, dinner) -Two hours after any meal (breakfast, lunch, dinner) -Bedtime  Record your readings and notify me if you continue to consistently run at or above 200 or below 100.  Please schedule a follow up appointment in 3 months for diabetes check.  It was a pleasure to meet you today! Please don't hesitate to call or message me with any questions. Welcome to [facility]!

## 2019-02-22 NOTE — Assessment & Plan Note (Signed)
Stable in the office today, continue current regimen. 

## 2019-02-22 NOTE — Assessment & Plan Note (Signed)
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 Following with nephrology in [place] and [place].  Continue current regimen.

## 2019-02-22 NOTE — Assessment & Plan Note (Signed)
Asymptomatic, endorses MI in 2008. Managed on statin, aspirin, beta blocker. Referral placed to cardiology per patient request.

## 2019-02-22 NOTE — Assessment & Plan Note (Addendum)
Chronic, compliant to insulin regimen. Glucose readings seem high in the afternoon.  POC A1C today of 7.2 which is overall okay but slightly high given medical history of CAD and renal disease. Discussed to increase morning meal insulin to 8 units, continue other insulin as prescribed.    Managed on statin. Following with nephrology. Following with podiatry. Pneumonia vaccination UTD.  Follow up in 3 months.

## 2019-02-22 NOTE — Progress Notes (Deleted)
Cardiology Office Note   Date:  02/22/2019   ID:  Anthony Blanchard, DOB 1975-05-09, MRN [id number]  PCP:  Pleas Koch, NP  Cardiologist:   Hanni Milford Martinique, MD   No chief complaint on file.     History of Present Illness: Anthony Blanchard is a 43 y.o. male who is seen at the request of Alma Friendly, NP for evaluation of CAD. He has a history of DM, HTN, HLD, obesity, CKD and blindness. He previously was on HD but underwent renal transplant in 2016. He has a contrast allergy. He has ? History of MI in 2009. Records from [facility] indicate a PTCA of the RCA in 2009 and stenting of the LAD in 2013.   Prior evaluation at [facility] in 2016 revealed a normal Echo and Myoview study.     Past Medical History:  Diagnosis Date  . Abnormal results of thyroid function studies 08/16/2011  . Chronic kidney disease   . Diabetes mellitus without complication ([place])   . GERD (gastroesophageal reflux disease)   . Glaucoma   . Hypertension   . Overweight   . Partial blindness     Past Surgical History:  Procedure Laterality Date  . 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     Current Outpatient Medications  Medication Sig Dispense Refill  . aspirin EC 81 MG tablet Take 81 mg by mouth.    Marland Kitchen atorvastatin (LIPITOR) 20 MG tablet Take 20 mg by mouth.    . Blood Glucose Monitoring Suppl (FIFTY50 GLUCOSE METER 2.0) w/Device KIT Use as instructed    . Cholecalciferol (VITAMIN D) 2000 units tablet Take by mouth.    . dorzolamide-timolol (COSOPT) 22.3-6.8 MG/ML ophthalmic solution INSTILL 1 DROP INTO LEFT EYE TWICE DAILY    . insulin glargine (LANTUS) 100 UNIT/ML injection Inject 14 Units into the skin at bedtime.     . magnesium oxide (MAG-OX) 400 MG tablet Take 400 mg by mouth daily.    . metoprolol tartrate (LOPRESSOR) 50 MG tablet Take 75 mg by mouth 2 (two) times daily.     . mycophenolate (MYFORTIC) 180 MG EC tablet TAKE 3 TABLETS (540 MG) BY MOUTH TWICE DAILY    . NOVOLOG FLEXPEN 100 UNIT/ML FlexPen 8 units  after breakfast, 8 units at lunch, and 6 units at bedtime    . omeprazole (PRILOSEC) 20 MG capsule Take 20 mg by mouth.    . silver sulfADIAZINE (SILVADENE) 1 % cream Apply pea-sized amount to wound daily. 50 g 0  . tacrolimus (PROGRAF) 1 MG capsule TAKE 6 CAPSULES (6 MG TOTAL) BY MOUTH EVERY TWELVE (12) HOURS.    Marland Kitchen ULTICARE MICRO PEN NEEDLES 32G X 4 MM MISC      No current facility-administered medications for this visit.     Allergies:   Shellfish allergy, Shellfish-derived products, Iodinated diagnostic agents, and Iodine    Social History:  The patient  reports that he has never smoked. He has never used smokeless tobacco.   Family History:  The patient's ***family history is not on file.    ROS:  Please see the history of present illness.   Otherwise, review of systems are positive for {NONE DEFAULTED:18576::"none"}.   All other systems are reviewed and negative.    PHYSICAL EXAM: VS:  There were no vitals taken for this visit. , BMI There is no height or weight on file to calculate BMI. GEN: Well nourished, well developed, in no acute distress  HEENT: normal  Neck: no JVD,  carotid bruits, or masses Cardiac: ***RRR; no murmurs, rubs, or gallops,no edema  Respiratory:  clear to auscultation bilaterally, normal work of breathing GI: soft, nontender, nondistended, + BS MS: no deformity or atrophy  Skin: warm and dry, no rash Neuro:  Strength and sensation are intact Psych: euthymic mood, full affect   EKG:  EKG {ACTION; IS/IS HDI:97847841} ordered today. The ekg ordered today demonstrates ***   Recent Labs: No results found for requested labs within last 8760 hours.    Lipid Panel No results found for: CHOL, TRIG, HDL, CHOLHDL, VLDL, LDLCALC, LDLDIRECT   Dated 7//21/20: A1c 8.4%. cholesterol 164, triglycerides 150, HDL 42, LDL 92.   Labs dated 01/11/19: normal CBC, BUN 26, creatinine 1.53.    Wt Readings from Last 3 Encounters:  02/22/19 234 lb 4 oz (106.3 kg)   04/04/17 222 lb (100.7 kg)  09/09/16 205 lb (93 kg)      Other studies Reviewed: Additional studies/ records that were reviewed today include: ***. Review of the above records demonstrates: ***   ASSESSMENT AND PLAN:  1.  ***   Current medicines are reviewed at length with the patient today.  The patient {ACTIONS; HAS/DOES NOT HAVE:19233} concerns regarding medicines.  The following changes have been made:  {PLAN; NO CHANGE:13088:s}  Labs/ tests ordered today include: *** No orders of the defined types were placed in this encounter.    Disposition:   FU with *** in {gen number 2-82:081388} {Days to years:10300}  Signed, Yanissa Michalsky [place], MD  02/22/2019 2:07 PM    [facility] [address] [place], [place], [address] Phone [phone], Fax [phone]

## 2019-02-22 NOTE — Addendum Note (Signed)
Addended by: Jacqualin Combes on: 02/22/2019 10:19 AM   Modules accepted: Orders

## 2019-02-22 NOTE — Assessment & Plan Note (Signed)
Referral placed to cardiology per patient request.

## 2019-02-22 NOTE — Progress Notes (Signed)
Subjective:    Patient ID: Anthony Blanchard, male    DOB: [birth year], 43 y.o.   MRN: [id number]  HPI  Anthony Blanchard is a 43 year old male who presents today to establish care and discuss the problems mentioned below. Will obtain/review records.  1) Type 2 Diabetes: Diagnosed in early 1990's.  Currently managed on Lantus 14 units HS, Novolog 4-8 units TID with meals. He was following with endocrinology but has not seen them in over one year.   He checks his glucose 4-5 times daily and is getting readings of:  Before breakfast: 138, 120, 98 Before lunch: high 100's, low 200's Just after eating dinner: 140's-150  He is following with podiatry for a diabetic ulcer. He is wearing a boot on the foot for now. Due for follow up next week.  2) CAD/LVH/Hypertension: Prior MI in 2008. Currently managed on atorvastatin 20 mg, metoprolol tartrate 50 mg BID, magnesium, aspirin. He denies chest pain, dizziness, shortness of breath. He would like to get connected with a cardiologist.   BP Readings from Last 3 Encounters:  02/22/19 130/82  08/16/18 (!) 154/77  08/29/17 (!) 145/76   3) Kidney Transplant: Occurred in 2016. Currently on mycophenolate 180 mg and Prograf 1 mg daily. He is following with nephrology ([facility]) Dr. Hollie Salk with most recent visit this week. He does follow up with the nephology surgeon every 6 months.   Review of Systems  Eyes:       Left sided glaucoma, blind to right eye  Respiratory: Negative for shortness of breath.   Cardiovascular: Negative for chest pain.  Neurological: Negative for dizziness and headaches.       Past Medical History:  Diagnosis Date  . Chronic kidney disease   . Diabetes mellitus without complication ([place])   . GERD (gastroesophageal reflux disease)   . Glaucoma   . Hypertension   . Overweight   . Partial blindness      Social History   Socioeconomic History  . Marital status: Single    Spouse name: Not on file  . Number of  children: Not on file  . Years of education: Not on file  . Highest education level: Not on file  Occupational History  . Not on file  Social Needs  . Financial resource strain: Not on file  . Food insecurity    Worry: Not on file    Inability: Not on file  . Transportation needs    Medical: Not on file    Non-medical: Not on file  Tobacco Use  . Smoking status: Never Smoker  . Smokeless tobacco: Never Used  Substance and Sexual Activity  . Alcohol use: Not on file  . Drug use: Not on file  . Sexual activity: Not on file  Lifestyle  . Physical activity    Days per week: Not on file    Minutes per session: Not on file  . Stress: Not on file  Relationships  . Social Herbalist on phone: Not on file    Gets together: Not on file    Attends religious service: Not on file    Active member of club or organization: Not on file    Attends meetings of clubs or organizations: Not on file    Relationship status: Not on file  . Intimate partner violence    Fear of current or ex partner: Not on file    Emotionally abused: Not on file    Physically  abused: Not on file    Forced sexual activity: Not on file  Other Topics Concern  . Not on file  Social History Narrative  . Not on file    Past Surgical History:  Procedure Laterality Date  . 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    No family history on file.  Allergies  Allergen Reactions  . Shellfish Allergy Anaphylaxis  . Shellfish-Derived Products Anaphylaxis  . Iodinated Diagnostic Agents Other (See Comments)  . Iodine Other (See Comments)    Current Outpatient Medications on File Prior to Visit  Medication Sig Dispense Refill  . aspirin EC 81 MG tablet Take 81 mg by mouth.    Marland Kitchen atorvastatin (LIPITOR) 20 MG tablet Take 20 mg by mouth.    . Blood Glucose Monitoring Suppl (FIFTY50 GLUCOSE METER 2.0) w/Device KIT Use as instructed    . Cholecalciferol (VITAMIN D) 2000 units tablet Take by mouth.    . dorzolamide-timolol  (COSOPT) 22.3-6.8 MG/ML ophthalmic solution INSTILL 1 DROP INTO LEFT EYE TWICE DAILY    . insulin glargine (LANTUS) 100 UNIT/ML injection Inject 14 Units into the skin at bedtime.     . magnesium oxide (MAG-OX) 400 MG tablet Take 400 mg by mouth daily.    . metoprolol tartrate (LOPRESSOR) 50 MG tablet Take 75 mg by mouth 2 (two) times daily.     . mycophenolate (MYFORTIC) 180 MG EC tablet TAKE 3 TABLETS (540 MG) BY MOUTH TWICE DAILY    . NOVOLOG FLEXPEN 100 UNIT/ML FlexPen 4 units after breakfast, 8 units at lunch, and 6 units at bedtime    . omeprazole (PRILOSEC) 20 MG capsule Take 20 mg by mouth.    . silver sulfADIAZINE (SILVADENE) 1 % cream Apply pea-sized amount to wound daily. 50 g 0  . tacrolimus (PROGRAF) 1 MG capsule TAKE 6 CAPSULES (6 MG TOTAL) BY MOUTH EVERY TWELVE (12) HOURS.    Marland Kitchen ULTICARE MICRO PEN NEEDLES 32G X 4 MM MISC      No current facility-administered medications on file prior to visit.     BP 130/82   Pulse 86   Temp (!) 97 F (36.1 C) (Temporal)   Ht '5\' 9"'$  (1.753 m)   Wt 234 lb 4 oz (106.3 kg)   SpO2 96%   BMI 34.59 kg/m    Objective:   Physical Exam  Constitutional: He appears well-nourished.  Neck: Neck supple.  Cardiovascular: Normal rate and regular rhythm.  Respiratory: Effort normal and breath sounds normal.  Skin: Skin is warm and dry.  Psychiatric: He has a normal mood and affect.           Assessment & Plan:

## 2019-02-22 NOTE — Assessment & Plan Note (Signed)
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 Following with nephrology. Influenza vaccination provided today. Pneumonia vaccination UTD.

## 2019-02-22 NOTE — Assessment & Plan Note (Signed)
Compliant to statin therapy, lipid panel reviewed from [facility] dating July 2020. Continue atorvastatin.

## 2019-02-26 ENCOUNTER — Ambulatory Visit: Payer: Medicare Other | Admitting: Cardiology

## 2019-02-27 DIAGNOSIS — E669 Obesity, unspecified: Secondary | ICD-10-CM | POA: Diagnosis not present

## 2019-02-27 DIAGNOSIS — L97511 Non-pressure chronic ulcer of other part of right foot limited to breakdown of skin: Secondary | ICD-10-CM | POA: Diagnosis not present

## 2019-02-27 DIAGNOSIS — D8989 Other specified disorders involving the immune mechanism, not elsewhere classified: Secondary | ICD-10-CM | POA: Diagnosis not present

## 2019-02-27 DIAGNOSIS — Z940 Kidney transplant status: Secondary | ICD-10-CM | POA: Diagnosis not present

## 2019-02-27 DIAGNOSIS — H401122 Primary open-angle glaucoma, left eye, moderate stage: Secondary | ICD-10-CM | POA: Diagnosis not present

## 2019-02-27 DIAGNOSIS — E11621 Type 2 diabetes mellitus with foot ulcer: Secondary | ICD-10-CM | POA: Diagnosis not present

## 2019-02-27 DIAGNOSIS — Z1331 Encounter for screening for depression: Secondary | ICD-10-CM | POA: Diagnosis not present

## 2019-02-27 DIAGNOSIS — E1129 Type 2 diabetes mellitus with other diabetic kidney complication: Secondary | ICD-10-CM | POA: Diagnosis not present

## 2019-02-27 DIAGNOSIS — I10 Essential (primary) hypertension: Secondary | ICD-10-CM | POA: Diagnosis not present

## 2019-02-27 DIAGNOSIS — E11319 Type 2 diabetes mellitus with unspecified diabetic retinopathy without macular edema: Secondary | ICD-10-CM | POA: Diagnosis not present

## 2019-02-27 DIAGNOSIS — Z794 Long term (current) use of insulin: Secondary | ICD-10-CM | POA: Diagnosis not present

## 2019-02-27 DIAGNOSIS — H409 Unspecified glaucoma: Secondary | ICD-10-CM | POA: Diagnosis not present

## 2019-02-27 DIAGNOSIS — H54415A Blindness right eye category 5, normal vision left eye: Secondary | ICD-10-CM | POA: Diagnosis not present

## 2019-03-01 ENCOUNTER — Other Ambulatory Visit: Payer: Self-pay

## 2019-03-01 ENCOUNTER — Ambulatory Visit (INDEPENDENT_AMBULATORY_CARE_PROVIDER_SITE_OTHER): Payer: Medicare Other | Admitting: Podiatry

## 2019-03-01 DIAGNOSIS — E08621 Diabetes mellitus due to underlying condition with foot ulcer: Secondary | ICD-10-CM

## 2019-03-01 DIAGNOSIS — M21961 Unspecified acquired deformity of right lower leg: Secondary | ICD-10-CM | POA: Diagnosis not present

## 2019-03-01 DIAGNOSIS — L97511 Non-pressure chronic ulcer of other part of right foot limited to breakdown of skin: Secondary | ICD-10-CM

## 2019-03-01 NOTE — Progress Notes (Signed)
Subjective:  Patient ID: Anthony Blanchard, male    DOB: 07-Feb-1976,  MRN: [id number]  Chief Complaint  Patient presents with  . Foot Pain    pt is here for a f/u on wound check right foot, pt states that he is doing alot better since the last time he was here, pt also states that there are no signs of infection   43 y.o. male presents for wound care. Hx above confirmed with patient. Wearing his surgical shoe.  Review of Systems: Negative except as noted in the HPI. Denies N/V/F/Ch.  Past Medical History:  Diagnosis Date  . Abnormal results of thyroid function studies 08/16/2011  . Chronic kidney disease   . Diabetes mellitus without complication ([place])   . GERD (gastroesophageal reflux disease)   . Glaucoma   . Hypertension   . Overweight   . Partial blindness     Current Outpatient Medications:  .  aspirin EC 81 MG tablet, Take 81 mg by mouth., Disp: , Rfl:  .  Blood Glucose Monitoring Suppl (FIFTY50 GLUCOSE METER 2.0) w/Device KIT, Use as instructed, Disp: , Rfl:  .  Cholecalciferol (VITAMIN D) 2000 units tablet, Take by mouth., Disp: , Rfl:  .  dorzolamide-timolol (COSOPT) 22.3-6.8 MG/ML ophthalmic solution, INSTILL 1 DROP INTO LEFT EYE TWICE DAILY, Disp: , Rfl:  .  insulin glargine (LANTUS) 100 UNIT/ML injection, Inject 14 Units into the skin at bedtime. , Disp: , Rfl:  .  magnesium oxide (MAG-OX) 400 MG tablet, Take 400 mg by mouth daily., Disp: , Rfl:  .  mycophenolate (MYFORTIC) 180 MG EC tablet, TAKE 3 TABLETS (540 MG) BY MOUTH TWICE DAILY, Disp: , Rfl:  .  NOVOLOG FLEXPEN 100 UNIT/ML FlexPen, 8 units after breakfast, 8 units at lunch, and 6 units at bedtime, Disp: , Rfl:  .  silver sulfADIAZINE (SILVADENE) 1 % cream, Apply pea-sized amount to wound daily., Disp: 50 g, Rfl: 0 .  tacrolimus (PROGRAF) 1 MG capsule, TAKE 6 CAPSULES (6 MG TOTAL) BY MOUTH EVERY TWELVE (12) HOURS., Disp: , Rfl:  .  ULTICARE MICRO PEN NEEDLES 32G X 4 MM MISC, , Disp: , Rfl:  .  atorvastatin  (LIPITOR) 20 MG tablet, Take 20 mg by mouth., Disp: , Rfl:  .  metoprolol tartrate (LOPRESSOR) 50 MG tablet, Take 75 mg by mouth 2 (two) times daily. , Disp: , Rfl:  .  omeprazole (PRILOSEC) 20 MG capsule, Take 20 mg by mouth., Disp: , Rfl:   Social History   Tobacco Use  Smoking Status Never Smoker  Smokeless Tobacco Never Used    Allergies  Allergen Reactions  . Shellfish Allergy Anaphylaxis  . Shellfish-Derived Products Anaphylaxis  . Iodinated Diagnostic Agents Other (See Comments)  . Iodine Other (See Comments)   Objective:  There were no vitals filed for this visit. There is no height or weight on file to calculate BMI. Constitutional Well developed. Well nourished.  Vascular Dorsalis pedis pulses palpable bilaterally. Posterior tibial pulses palpable bilaterally. Capillary refill normal to all digits.  No cyanosis or clubbing noted. Pedal hair growth normal.  Neurologic Normal speech. Oriented to person, place, and time. Protective sensation absent  Dermatologic Wound Location: R 5th MPJ Wound Base: Granular/Healthy Peri-wound: Calloused Exudate: None: wound tissue dry Wound Measurements: 1xx post-debridment  Orthopedic: No pain to palpation either foot.   Radiographs: None Assessment:   1. Diabetic ulcer of right foot associated with diabetes mellitus due to underlying condition, limited to breakdown of skin, unspecified part of  foot ([place])   2. Deformity of metatarsal bone of right foot    Plan:  Patient was evaluated and treated and all questions answered.  Ulcer right foot; prominent 5th metatarsal head right -Debridement as below. -Dressed with silvadene, DSD. -Continue off-loading with sx shoe -Discussed proceeding with metatarsal head excision and wound closure if wound remains stalled.  Procedure: Excisional Debridement of Wound Rationale: Removal of non-viable soft tissue from the wound to promote healing.  Anesthesia: none Pre-Debridement Wound  Measurements: 0.5 cm x 0.5 cm x 0.2 cm  Post-Debridement Wound Measurements: 1 cm x 1 cm x 0.3 cm  Type of Debridement: Sharp Excisional Tissue Removed: Non-viable soft tissue Depth of Debridement: subcutaneous tissue. Technique: Sharp excisional debridement to bleeding, viable wound base.  Dressing: Dry, sterile, compression dressing. Disposition: Patient tolerated procedure well. Patient to return in 1 week for follow-up.     No follow-ups on file.

## 2019-03-02 DIAGNOSIS — M65331 Trigger finger, right middle finger: Secondary | ICD-10-CM | POA: Diagnosis not present

## 2019-03-05 DIAGNOSIS — Z940 Kidney transplant status: Principal | ICD-10-CM

## 2019-03-06 DIAGNOSIS — N5201 Erectile dysfunction due to arterial insufficiency: Secondary | ICD-10-CM | POA: Diagnosis not present

## 2019-03-06 NOTE — Progress Notes (Signed)
Cardiology Office Note:   Date:  03/07/2019  NAME:  Anthony Blanchard    MRN: [id number] DOB:  05/15/1975   PCP:  [facility]  Cardiologist:  Evalina Field, MD   Referring MD: Pleas Koch, NP   Chief Complaint  Patient presents with  . Coronary Artery Disease   History of Present Illness:   Anthony Blanchard is a 43 y.o. male with a hx of diabetes, ESRD status post kidney transplant in 2016 at [facility], GERD, CAD (status post PCI to RCA in 2009 and PCI to LAD in 2013) who is being seen today for the evaluation of CAD at the request of Pleas Koch, NP.  Review of records from [facility] in care where demonstrate he had a renal transplant in 2016.  He appears to have had PCI to the RCA and LAD lesion in 2009 and 2013, respectively.  An echocardiogram was performed in 2016 at [facility] system that showed normal ejection fraction, 60 to 65% and no significant valvular heart disease.  He reports he has been doing well since his last heart stent.  He reports he walks 15 minutes/day without any symptoms of angina or shortness of breath.  He takes no nitroglycerin.  His blood pressure is well controlled today.  Review of laboratory data shows an A1c of 7.2, serum creatinine 1.53, TSH 0.44, LDL cholesterol 92 last month.  He is only on Lipitor 20 mg.  He says he is working on his diabetes and keeping that under better control.  I have applauded him on that today.  His EKG demonstrates LVH and a questionable lateral wall infarct.  No concerning symptoms reported today.  Past Medical History: Past Medical History:  Diagnosis Date  . Abnormal results of thyroid function studies 08/16/2011  . Chronic kidney disease   . Coronary artery disease   . Diabetes mellitus without complication ([place])   . GERD (gastroesophageal reflux disease)   . Glaucoma   . Hypertension   . Overweight   . Partial blindness     Past Surgical History: Past Surgical History:  Procedure Laterality Date  .  CARDIAC CATHETERIZATION    . 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    Current Medications: Current Meds  Medication Sig  . aspirin EC 81 MG tablet Take 81 mg by mouth.  Marland Kitchen atorvastatin (LIPITOR) 40 MG tablet Take 1 tablet (40 mg total) by mouth daily.  . Blood Glucose Monitoring Suppl (FIFTY50 GLUCOSE METER 2.0) w/Device KIT Use as instructed  . Cholecalciferol (VITAMIN D) 2000 units tablet Take by mouth.  . dorzolamide-timolol (COSOPT) 22.3-6.8 MG/ML ophthalmic solution INSTILL 1 DROP INTO LEFT EYE TWICE DAILY  . insulin glargine (LANTUS) 100 UNIT/ML injection Inject 14 Units into the skin at bedtime.   . magnesium oxide (MAG-OX) 400 MG tablet Take 400 mg by mouth daily.  . metoprolol tartrate (LOPRESSOR) 50 MG tablet Take 75 mg by mouth 2 (two) times daily.   . mycophenolate (MYFORTIC) 180 MG EC tablet TAKE 3 TABLETS (540 MG) BY MOUTH TWICE DAILY  . NOVOLOG FLEXPEN 100 UNIT/ML FlexPen 8 units after breakfast, 8 units at lunch, and 6 units at bedtime  . omeprazole (PRILOSEC) 20 MG capsule Take 20 mg by mouth.  . silver sulfADIAZINE (SILVADENE) 1 % cream Apply pea-sized amount to wound daily.  . tacrolimus (PROGRAF) 1 MG capsule TAKE 6 CAPSULES (6 MG TOTAL) BY MOUTH EVERY TWELVE (12) HOURS.  Marland Kitchen ULTICARE MICRO PEN NEEDLES 32G X 4 MM  MISC   . [DISCONTINUED] atorvastatin (LIPITOR) 20 MG tablet Take 20 mg by mouth.     Allergies:    Shellfish allergy, Shellfish-derived products, Iodinated diagnostic agents, and Iodine   Social History: Social History   Socioeconomic History  . Marital status: Single    Spouse name: Not on file  . Number of children: Not on file  . Years of education: Not on file  . Highest education level: Not on file  Occupational History  . Not on file  Social Needs  . Financial resource strain: Not on file  . Food insecurity    Worry: Not on file    Inability: Not on file  . Transportation needs    Medical: Not on file    Non-medical: Not on file  Tobacco Use  .  Smoking status: Never Smoker  . Smokeless tobacco: Never Used  Substance and Sexual Activity  . Alcohol use: Not Currently  . Drug use: Not on file  . Sexual activity: Not on file  Lifestyle  . Physical activity    Days per week: Not on file    Minutes per session: Not on file  . Stress: Not on file  Relationships  . Social Herbalist on phone: Not on file    Gets together: Not on file    Attends religious service: Not on file    Active member of club or organization: Not on file    Attends meetings of clubs or organizations: Not on file    Relationship status: Not on file  Other Topics Concern  . Not on file  Social History Narrative  . Not on file     Family History: The patient's family history includes Alcohol abuse in his father; Diabetes in his mother.  ROS:   All other ROS reviewed and negative. Pertinent positives noted in the HPI.     EKGs/Labs/Other Studies Reviewed:   The following studies were personally reviewed by me today:  EKG:  EKG is ordered today.  The ekg ordered today demonstrates normal sinus rhythm, heart rate 78, Q waves in the lateral leads, LVH by voltage, repolarization abnormality present, and was personally reviewed by me.   Recent Labs: No results found for requested labs within last 8760 hours.   Recent Lipid Panel No results found for: CHOL, TRIG, HDL, CHOLHDL, VLDL, LDLCALC, LDLDIRECT  Physical Exam:   VS:  BP (!) 152/72   Pulse 78   Temp (!) 97 F (36.1 C)   Ht '5\' 9"'$  (1.753 m)   Wt 236 lb 9.6 oz (107.3 kg)   SpO2 97%   BMI 34.94 kg/m    Wt Readings from Last 3 Encounters:  03/07/19 236 lb 9.6 oz (107.3 kg)  02/22/19 234 lb 4 oz (106.3 kg)  04/04/17 222 lb (100.7 kg)    General: Well nourished, well developed, in no acute distress Heart: Atraumatic, normal size  Eyes: PEERLA, EOMI  Neck: Supple, no JVD Endocrine: No thryomegaly Cardiac: Normal S1, S2; RRR; no murmurs, rubs, or gallops Lungs: Clear to  auscultation bilaterally, no wheezing, rhonchi or rales  Abd: Soft, nontender, no hepatomegaly  Ext: No edema, pulses 2+ Musculoskeletal: No deformities, BUE and BLE strength normal and equal Skin: Warm and dry, no rashes   Neuro: Alert and oriented to person, place, time, and situation, CNII-XII grossly intact, no focal deficits  Psych: Normal mood and affect   ASSESSMENT:   LAURENS MATHENY is a 43 y.o. male  who presents for the following: 1. Coronary artery disease involving native coronary artery of native heart without angina pectoris   2. Essential hypertension   3. Hyperlipidemia, unspecified hyperlipidemia type     PLAN:   1. Coronary artery disease involving native coronary artery of native heart without angina pectoris -Status post PCI to the RCA in 2009, PCI to the LAD in 2013.  He has no symptoms of angina today.  An echocardiogram in 2016 to the [facility] system showed normal left ventricular ejection fraction and no wall motion abnormalities.  There was also no significant valvular heart disease.  No major change in symptoms to suggest he is having angina.  I think he is doing quite well.  His LDL cholesterol is not at goal.  In July it was noted to be 92.  We will increase his Lipitor to 40 mg nightly.  We will plan to see him back in 2 months for fasting lipid profile to further discuss this.  Should he have change in symptoms we could pursue further testing, but given his lack of symptoms I think he is doing well.  2. Essential hypertension -Blood pressure slightly elevated today.  Seems to be within normal range on prior review.  We will keep an eye on this for now.  3. Hyperlipidemia, unspecified hyperlipidemia type -LDL cholesterol not at goal.  Repeat lipid profile in 2 months and will see me back.  Increase Lipitor to 40 mg nightly.   Disposition: Return in about 2 months (around 05/07/2019).  Medication Adjustments/Labs and Tests Ordered: Current medicines are reviewed at  length with the patient today.  Concerns regarding medicines are outlined above.  Orders Placed This Encounter  Procedures  . Lipid Profile  . EKG 12-Lead   Meds ordered this encounter  Medications  . atorvastatin (LIPITOR) 40 MG tablet    Sig: Take 1 tablet (40 mg total) by mouth daily.    Dispense:  90 tablet    Refill:  3    Patient Instructions  Medication Instructions:  Your physician has recommended you make the following change in your medication:   INCREASE YOUR ATORVASTATIN (LIPITOR) TO 40 MG BY MOUTH DAILY  *If you need a refill on your cardiac medications before your next appointment, please call your pharmacy*  Lab Work: Your physician recommends that you return for lab work in: 2 MONTHS ([place]) You will not need an appointment for lab work. Make sure to fast after midnight on the day you choose to present for lab work. Please present to the following [facility] location or any [facility] lab that is convenient for you with the lab slip included  [address] [place], [place] [address]  If you have labs (blood work) drawn today and your tests are completely normal, you will receive your results only by: Marland Kitchen MyChart Message (if you have MyChart) OR . A paper copy in the mail If you have any lab test that is abnormal or we need to change your treatment, we will call you to review the results.  Testing/Procedures: NONE  Follow-Up: At [facility], you and your health needs are our priority.  As part of our continuing mission to provide you with exceptional heart care, we have created designated Provider Care Teams.  These Care Teams include your primary Cardiologist (physician) and Advanced Practice Providers (APPs -  Physician Assistants and Nurse Practitioners) who all work together to provide you with the care you need, when you  need it.  Your next appointment:   2 month(s)  The format for your next appointment:   In Person   Provider:   You may see Evalina Field, MD or one of the following Advanced Practice Providers on your designated Care Team:    Almyra Deforest, PA-C  Fabian Sharp, PA-C or   Roby Lofts, PA-C      Signed, Addison Naegeli. Audie Box, [place]  [address], [place], [place] [address] [phone]  03/07/2019 4:03 PM

## 2019-03-07 ENCOUNTER — Encounter: Payer: Self-pay | Admitting: Cardiovascular Disease

## 2019-03-07 ENCOUNTER — Ambulatory Visit (INDEPENDENT_AMBULATORY_CARE_PROVIDER_SITE_OTHER): Payer: Medicare Other | Admitting: Cardiovascular Disease

## 2019-03-07 ENCOUNTER — Other Ambulatory Visit: Payer: Self-pay

## 2019-03-07 VITALS — BP 152/72 | HR 78 | Temp 97.0°F | Ht 69.0 in | Wt 236.6 lb

## 2019-03-07 DIAGNOSIS — I2510 Atherosclerotic heart disease of native coronary artery without angina pectoris: Secondary | ICD-10-CM | POA: Diagnosis not present

## 2019-03-07 DIAGNOSIS — E785 Hyperlipidemia, unspecified: Secondary | ICD-10-CM | POA: Diagnosis not present

## 2019-03-07 DIAGNOSIS — I10 Essential (primary) hypertension: Secondary | ICD-10-CM

## 2019-03-07 MED ORDER — ATORVASTATIN CALCIUM 40 MG PO TABS: 40.0000 mg | ORAL_TABLET | Freq: Every day | ORAL | 3 refills | Status: AC

## 2019-03-07 NOTE — Patient Instructions (Signed)
Medication Instructions:  Your physician has recommended you make the following change in your medication:   INCREASE YOUR ATORVASTATIN (LIPITOR) TO 40 MG BY MOUTH DAILY  *If you need a refill on your cardiac medications before your next appointment, please call your pharmacy*  Lab Work: Your physician recommends that you return for lab work in: 2 MONTHS ([place]) You will not need an appointment for lab work. Make sure to fast after midnight on the day you choose to present for lab work. Please present to the following [facility] location or any [facility] lab that is convenient for you with the lab slip included  [address] [place], [place] [address]  If you have labs (blood work) drawn today and your tests are completely normal, you will receive your results only by: Marland Kitchen MyChart Message (if you have MyChart) OR . A paper copy in the mail If you have any lab test that is abnormal or we need to change your treatment, we will call you to review the results.  Testing/Procedures: NONE  Follow-Up: At [facility], you and your health needs are our priority.  As part of our continuing mission to provide you with exceptional heart care, we have created designated Provider Care Teams.  These Care Teams include your primary Cardiologist (physician) and Advanced Practice Providers (APPs -  Physician Assistants and Nurse Practitioners) who all work together to provide you with the care you need, when you need it.  Your next appointment:   2 month(s)  The format for your next appointment:   In Person  Provider:   You may see Evalina Field, MD or one of the following Advanced Practice Providers on your designated Care Team:    Almyra Deforest, PA-C  Fabian Sharp, PA-C or   Roby Lofts, [place]

## 2019-03-14 NOTE — Unmapped (Signed)
[facility] Clinical Assessment & Refill Coordination Note    Benjamin Maldonado, DOB: 03-26-1976  Phone: [phone] (home)     All above HIPAA information was verified with patient.     Specialty Medication(s):   Transplant: Myfortic 180mg  and tacrolimus 1mg      Current Outpatient Medications   Medication Sig Dispense Refill   ??? acetaminophen (TYLENOL) 325 MG tablet Take 2 tablets (650 mg total) by mouth every six (6) hours as needed for pain. 100 tablet 2   ??? aspirin (ADULT LOW DOSE ASPIRIN) 81 MG tablet Take 1 tablet (81 mg total) by mouth daily. 30 tablet 11   ??? atorvastatin (LIPITOR) 20 MG tablet Take 1 tablet (20 mg total) by mouth daily. 90 tablet 3   ??? blood sugar diagnostic Strp by Other route Four (4) times a day (before meals and nightly). One Touch 100 strip 5   ??? blood-glucose meter (GLUCOSE MONITORING KIT) kit Use as instructed 1 each 0   ??? GLUC.METER,DIS.P-LOADED STRIPS (GLUCOSE METER, DISP & STRIPS MISC) Frequency:PHARMDIR   Dosage:0.0     Instructions:  Note:250.00, life long Dose: 1     ??? insulin ASPART (NOVOLOG FLEXPEN) 100 unit/mL (3 mL) injection pen Inject 4 units under skin with breakfast, 8 units with lunch, and 6 units with supper and sliding scale. Max units 54/day 30 mL 11   ??? insulin glargine (BASAGLAR, LANTUS) 100 unit/mL (3 mL) injection pen Inject 0.16 mL (16 Units total) under the skin nightly. 15 mL 11   ??? metoprolol tartrate (LOPRESSOR) 50 MG tablet TAKE 1 AND 1/2 TABLETS (75 MG) BY MOUTH TWICE DAILY 90 tablet PRN   ??? MYFORTIC 180 mg EC tablet TAKE 3 TABLETS (540 MG) BY MOUTH TWICE DAILY 180 tablet 11   ??? omeprazole (PRILOSEC) 20 MG capsule TAKE 1 CAPSULE BY MOUTH ONCE DAILY 30 capsule PRN   ??? pen needle, diabetic 32 gauge x 5/32 (4 mm) Ndle USE TO ADMINISTER INSULIN 4 TIMES DAILY 100 each PRN   ??? tacrolimus (PROGRAF) 1 MG capsule Take 6 capsules (6 mg total) by mouth two (2) times a day. 360 capsule 11 No current facility-administered medications for this visit.         Changes to medications: Benjamin Maldonado reports no changes at this time.    Allergies   Allergen Reactions   ??? Iodine Other (See Comments)   ??? Iodine And Iodide Containing Products    ??? Shellfish Containing Products Other (See Comments)       Changes to allergies: No    SPECIALTY MEDICATION ADHERENCE     Tacrolimus 1mg   : 10 days of medicine on hand   Myfortic 180mg   : 10 days of medicine on hand     Medication Adherence    Patient reported X missed doses in the last month: 0  Specialty Medication: myfortic 180mg   Patient is on additional specialty medications: Yes  Additional Specialty Medications: Tacrolimus 1mg   Patient Reported Additional Medication X Missed Doses in the Last Month: 0  Adherence tools used: patient uses a pill box to manage medications  Support network for adherence: family member          Specialty medication(s) dose(s) confirmed: Regimen is correct and unchanged.     Are there any concerns with adherence? No    Adherence counseling provided? Not needed    CLINICAL MANAGEMENT AND INTERVENTION      Clinical Benefit Assessment:    Do you feel  the medicine is effective or helping your condition? Yes    Clinical Benefit counseling provided? Not needed    Adverse Effects Assessment:    Are you experiencing any side effects? No    Are you experiencing difficulty administering your medicine? No    Quality of Life Assessment:    How many days over the past month did your transplant  keep you from your normal activities? For example, brushing your teeth or getting up in the morning. 0    Have you discussed this with your provider? Not needed    Therapy Appropriateness:    Is therapy appropriate? Yes, therapy is appropriate and should be continued    DISEASE/MEDICATION-SPECIFIC INFORMATION      N/A    PATIENT SPECIFIC NEEDS     ? Does the patient have any physical, cognitive, or cultural barriers? No ? Is the patient high risk? Yes, patient taking a REMS drug     ? Does the patient require a Care Management Plan? No     ? Does the patient require physician intervention or other additional services (i.e. nutrition, smoking cessation, social work)? No      SHIPPING     Specialty Medication(s) to be Shipped:   Transplant: Myfortic 180mg  and tacrolimus 1mg     Other medication(s) to be shipped: na     Changes to insurance: No    Delivery Scheduled: Yes, Expected medication delivery date: 03/21/2019.     Medication will be delivered via UPS to the confirmed prescription address in [facility].    The patient will receive a drug information handout for each medication shipped and additional FDA Medication Guides as required.  Verified that patient has previously received a Conservation officer, historic buildings.    All of the patient's questions and concerns have been addressed.    Benjamin Maldonado   [facility] Specialty Pharmacist

## 2019-03-20 MED FILL — OMEPRAZOLE 20 MG CAPSULE,DELAYED RELEASE: 30 days supply | Qty: 30 | Fill #7 | Status: AC

## 2019-03-20 MED FILL — METOPROLOL TARTRATE 50 MG TABLET: 30 days supply | Qty: 90 | Fill #7 | Status: AC

## 2019-03-20 MED FILL — MYFORTIC 180 MG TABLET,DELAYED RELEASE: 30 days supply | Qty: 180 | Fill #10 | Status: AC

## 2019-03-20 MED FILL — TACROLIMUS 1 MG CAPSULE: 30 days supply | Qty: 360 | Fill #3 | Status: AC

## 2019-03-20 MED FILL — MYFORTIC 180 MG TABLET,DELAYED RELEASE: 30 days supply | Qty: 180 | Fill #10

## 2019-03-20 MED FILL — OMEPRAZOLE 20 MG CAPSULE,DELAYED RELEASE: ORAL | 30 days supply | Qty: 30 | Fill #7

## 2019-03-20 MED FILL — ULTICARE PEN NEEDLE 32 GAUGE X 5/32" (4 MM): 25 days supply | Qty: 100 | Fill #12 | Status: AC

## 2019-03-20 MED FILL — ULTICARE PEN NEEDLE 32 GAUGE X 5/32" (4 MM): 25 days supply | Qty: 100 | Fill #12

## 2019-03-20 MED FILL — METOPROLOL TARTRATE 50 MG TABLET: 30 days supply | Qty: 90 | Fill #7

## 2019-03-20 MED FILL — TACROLIMUS 1 MG CAPSULE, IMMEDIATE-RELEASE: ORAL | 30 days supply | Qty: 360 | Fill #3

## 2019-03-22 ENCOUNTER — Ambulatory Visit (INDEPENDENT_AMBULATORY_CARE_PROVIDER_SITE_OTHER): Payer: Medicare Other | Admitting: Podiatry

## 2019-03-22 ENCOUNTER — Other Ambulatory Visit: Payer: Self-pay

## 2019-03-22 ENCOUNTER — Other Ambulatory Visit: Payer: Self-pay | Admitting: *Deleted

## 2019-03-22 DIAGNOSIS — E1142 Type 2 diabetes mellitus with diabetic polyneuropathy: Secondary | ICD-10-CM | POA: Diagnosis not present

## 2019-03-22 DIAGNOSIS — L97511 Non-pressure chronic ulcer of other part of right foot limited to breakdown of skin: Secondary | ICD-10-CM

## 2019-03-22 DIAGNOSIS — E08621 Diabetes mellitus due to underlying condition with foot ulcer: Secondary | ICD-10-CM | POA: Diagnosis not present

## 2019-03-22 DIAGNOSIS — M7751 Other enthesopathy of right foot: Secondary | ICD-10-CM | POA: Diagnosis not present

## 2019-03-22 DIAGNOSIS — M21961 Unspecified acquired deformity of right lower leg: Secondary | ICD-10-CM | POA: Diagnosis not present

## 2019-03-22 DIAGNOSIS — I10 Essential (primary) hypertension: Secondary | ICD-10-CM

## 2019-03-22 DIAGNOSIS — Z01812 Encounter for preprocedural laboratory examination: Secondary | ICD-10-CM

## 2019-03-22 NOTE — Patient Instructions (Signed)
Pre-Operative Instructions  Congratulations, you have decided to take an important step towards improving your quality of life.  You can be assured that the doctors and staff at [facility] will be with you every step of the way.  Here are some important things you should know:  1. Plan to be at the surgery center/hospital at least 1 (one) hour prior to your scheduled time, unless otherwise directed by the surgical center/hospital staff.  You must have a responsible adult accompany you, remain during the surgery and drive you home.  Make sure you have directions to the surgical center/hospital to ensure you arrive on time. 2. If you are having surgery at [facility] or [facility] hospitals, you will need a copy of your medical history and physical form from your family physician within one month prior to the date of surgery. We will give you a form for your primary physician to complete.  3. We make every effort to accommodate the date you request for surgery.  However, there are times where surgery dates or times have to be moved.  We will contact you as soon as possible if a change in schedule is required.   4. No aspirin/ibuprofen for one week before surgery.  If you are on aspirin, any non-steroidal anti-inflammatory medications (Mobic, Aleve, Ibuprofen) should not be taken seven (7) days prior to your surgery.  You make take Tylenol for pain prior to surgery.  5. Medications - If you are taking daily heart and blood pressure medications, seizure, reflux, allergy, asthma, anxiety, pain or diabetes medications, make sure you notify the surgery center/hospital before the day of surgery so they can tell you which medications you should take or avoid the day of surgery. 6. No food or drink after midnight the night before surgery unless directed otherwise by surgical center/hospital staff. 7. No alcoholic beverages 24-hours prior to surgery.  No smoking 24-hours prior or 24-hours after  surgery. 8. Wear loose pants or shorts. They should be loose enough to fit over bandages, boots, and casts. 9. Don't wear slip-on shoes. Sneakers are preferred. 10. Bring your boot with you to the surgery center/hospital.  Also bring crutches or a walker if your physician has prescribed it for you.  If you do not have this equipment, it will be provided for you after surgery. 11. If you have not been contacted by the surgery center/hospital by the day before your surgery, call to confirm the date and time of your surgery. 12. Leave-time from work may vary depending on the type of surgery you have.  Appropriate arrangements should be made prior to surgery with your employer. 13. Prescriptions will be provided immediately following surgery by your doctor.  Fill these as soon as possible after surgery and take the medication as directed. Pain medications will not be refilled on weekends and must be approved by the doctor. 14. Remove nail polish on the operative foot and avoid getting pedicures prior to surgery. 15. Wash the night before surgery.  The night before surgery wash the foot and leg well with water and the antibacterial soap provided. Be sure to pay special attention to beneath the toenails and in between the toes.  Wash for at least three (3) minutes. Rinse thoroughly with water and dry well with a towel.  Perform this wash unless told not to do so by your physician.  Enclosed: 1 Ice pack (please put in freezer the night before surgery)   1 Hibiclens skin cleaner     Pre-op instructions  If you have any questions regarding the instructions, please do not hesitate to call our office.  [place]: [address], [place], [place] [address] -- [phone]  [place]: [address] [place], [place] [address] -- [phone]  [place]: [address]  [place], [place] [address] -- [phone]   Website: [email or web address] 

## 2019-03-30 ENCOUNTER — Telehealth: Payer: Self-pay | Admitting: *Deleted

## 2019-03-30 NOTE — Progress Notes (Signed)
Called pt again to make covid appt for tomorrow via phone.  Pt stated he needs to reschedule his surgery due to his father-n-law just had a stroke today, whom lives in [place], and he and his wife are leaving today to go to [place].  I informed I would leave message with Dr March Rummage scheduler, Delydia, and let her know and that she may need to call to confirm your decision.

## 2019-03-30 NOTE — Telephone Encounter (Signed)
I received a call from [place] at [facility]  She said she has been trying to get in contact with Anthony Blanchard with no success regarding his surgery that's scheduled for 04/04/2019.  She said she has made several attempts to reach him. I informed her that to my knowledge, Anthony Blanchard plans on having his surgery. I told her I would try to reach him as well.    I attempted to call Anthony Blanchard.  I left a message with the person that answered the phone asking Anthony Blanchard to give me a call.

## 2019-04-02 DIAGNOSIS — Z940 Kidney transplant status: Principal | ICD-10-CM

## 2019-04-02 DIAGNOSIS — I129 Hypertensive chronic kidney disease with stage 1 through stage 4 chronic kidney disease, or unspecified chronic kidney disease: Secondary | ICD-10-CM | POA: Diagnosis not present

## 2019-04-02 DIAGNOSIS — E785 Hyperlipidemia, unspecified: Secondary | ICD-10-CM | POA: Diagnosis not present

## 2019-04-02 DIAGNOSIS — E1129 Type 2 diabetes mellitus with other diabetic kidney complication: Secondary | ICD-10-CM | POA: Diagnosis not present

## 2019-04-02 LAB — CBC W/ DIFFERENTIAL
BANDED NEUTROPHILS ABSOLUTE COUNT: 0 10*3/uL (ref 0.0–0.1)
BASOPHILS ABSOLUTE COUNT: 0 10*3/uL (ref 0.0–0.2)
BASOPHILS RELATIVE PERCENT: 0 %
EOSINOPHILS ABSOLUTE COUNT: 0.1 10*3/uL (ref 0.0–0.4)
EOSINOPHILS RELATIVE PERCENT: 1 %
HEMATOCRIT: 41.8 % (ref 37.5–51.0)
IMMATURE GRANULOCYTES: 0 %
LYMPHOCYTES ABSOLUTE COUNT: 1 10*3/uL (ref 0.7–3.1)
LYMPHOCYTES RELATIVE PERCENT: 16 %
MEAN CORPUSCULAR HEMOGLOBIN CONC: 31.1 g/dL — ABNORMAL LOW (ref 31.5–35.7)
MEAN CORPUSCULAR HEMOGLOBIN: 25.7 pg — ABNORMAL LOW (ref 26.6–33.0)
MEAN CORPUSCULAR VOLUME: 83 fL (ref 79–97)
MONOCYTES ABSOLUTE COUNT: 0.5 10*3/uL (ref 0.1–0.9)
MONOCYTES RELATIVE PERCENT: 9 %
NEUTROPHILS ABSOLUTE COUNT: 4.6 10*3/uL (ref 1.4–7.0)
NEUTROPHILS RELATIVE PERCENT: 74 %
PLATELET COUNT: 189 10*3/uL (ref 150–450)
RED BLOOD CELL COUNT: 5.05 x10E6/uL (ref 4.14–5.80)
RED CELL DISTRIBUTION WIDTH: 14 % (ref 11.6–15.4)
WHITE BLOOD CELL COUNT: 6.3 10*3/uL (ref 3.4–10.8)

## 2019-04-02 LAB — NEUTROPHILS ABSOLUTE COUNT: Neutrophils:NCnc:Pt:Bld:Qn:Automated count: 4.6

## 2019-04-02 NOTE — Telephone Encounter (Signed)
"  I'm calling you back about [employer].  I did just get a hold of the patient it's about 5:45 pm.  He said that his father-in-law had a stroke today and that he and his wife were leaving at this moment to go to [place].  So, he needed to cancel his procedure.  I let him know that I would leave you a message.  You may want to call him to confirm his decision.  I put the note in Epic where I spoke to him.  He said that was fine but he would need to reschedule."   I am calling you in regards to your surgery.  Do you want to reschedule it?  "Yes, because I'm in [place] right now."  Do you want to reschedule it now?  "I'll call you when I get back in town on Thursday."  I called and canceled Anthony Blanchard's surgery that's scheduled at [facility] on 04/04/2019.

## 2019-04-03 LAB — BASIC METABOLIC PANEL
BLOOD UREA NITROGEN: 23 mg/dL (ref 6–24)
BUN / CREAT RATIO: 15 (ref 9–20)
CALCIUM: 9.6 mg/dL (ref 8.7–10.2)
CO2: 21 mmol/L (ref 20–29)
CREATININE: 1.54 mg/dL — ABNORMAL HIGH (ref 0.76–1.27)
GFR MDRD NON AF AMER: 54 mL/min/{1.73_m2} — ABNORMAL LOW
GLUCOSE: 139 mg/dL — ABNORMAL HIGH (ref 65–99)
POTASSIUM: 4.5 mmol/L (ref 3.5–5.2)
SODIUM: 139 mmol/L (ref 134–144)

## 2019-04-03 LAB — PHOSPHORUS, SERUM: Phosphate:MCnc:Pt:Ser/Plas:Qn:: 2.6 — ABNORMAL LOW

## 2019-04-03 LAB — CHLORIDE: Chloride:SCnc:Pt:Ser/Plas:Qn:: 106

## 2019-04-03 LAB — MAGNESIUM: Magnesium:MCnc:Pt:Ser/Plas:Qn:: 1.5 — ABNORMAL LOW

## 2019-04-04 ENCOUNTER — Ambulatory Visit (HOSPITAL_BASED_OUTPATIENT_CLINIC_OR_DEPARTMENT_OTHER): Admission: RE | Admit: 2019-04-04 | Payer: Medicare Other | Source: Home / Self Care | Admitting: Podiatry

## 2019-04-04 ENCOUNTER — Encounter (HOSPITAL_BASED_OUTPATIENT_CLINIC_OR_DEPARTMENT_OTHER): Admission: RE | Payer: Self-pay | Source: Home / Self Care

## 2019-04-04 LAB — TACROLIMUS BLOOD: Tacrolimus:MCnc:Pt:Bld:Qn:LC/MS/MS: 10.9

## 2019-04-04 SURGERY — EXCISION, METATARSAL BONE, HEAD
Anesthesia: General | Site: Toe | Laterality: Right

## 2019-04-10 ENCOUNTER — Encounter: Payer: Medicare Other | Admitting: Sports Medicine

## 2019-04-11 DIAGNOSIS — E1165 Type 2 diabetes mellitus with hyperglycemia: Principal | ICD-10-CM

## 2019-04-11 DIAGNOSIS — Z940 Kidney transplant status: Principal | ICD-10-CM

## 2019-04-11 DIAGNOSIS — Z794 Long term (current) use of insulin: Principal | ICD-10-CM

## 2019-04-11 MED ORDER — PEN NEEDLE, DIABETIC 32 GAUGE X 5/32" (4 MM)
PRN refills | 0 days | Status: CP
Start: 2019-04-11 — End: 2020-04-10

## 2019-04-11 NOTE — Unmapped (Signed)
[facility] Clinical Assessment & Refill Coordination Note    Benjamin Maldonado, DOB: 03/16/1976  Phone: [phone] (home)     All above HIPAA information was verified with patient.     Was a Nurse, learning disability used for this call? No    Specialty Medication(s):   Transplant: Myfortic 180mg  and tacrolimus 1mg      Current Outpatient Medications   Medication Sig Dispense Refill   ??? acetaminophen (TYLENOL) 325 MG tablet Take 2 tablets (650 mg total) by mouth every six (6) hours as needed for pain. 100 tablet 2   ??? aspirin (ADULT LOW DOSE ASPIRIN) 81 MG tablet Take 1 tablet (81 mg total) by mouth daily. 30 tablet 11   ??? atorvastatin (LIPITOR) 20 MG tablet Take 1 tablet (20 mg total) by mouth daily. 90 tablet 3   ??? blood sugar diagnostic Strp by Other route Four (4) times a day (before meals and nightly). One Touch 100 strip 5   ??? blood-glucose meter (GLUCOSE MONITORING KIT) kit Use as instructed 1 each 0   ??? GLUC.METER,DIS.P-LOADED STRIPS (GLUCOSE METER, DISP & STRIPS MISC) Frequency:PHARMDIR   Dosage:0.0     Instructions:  Note:250.00, life long Dose: 1     ??? insulin ASPART (NOVOLOG FLEXPEN) 100 unit/mL (3 mL) injection pen Inject 4 units under skin with breakfast, 8 units with lunch, and 6 units with supper and sliding scale. Max units 54/day 30 mL 11   ??? insulin glargine (BASAGLAR, LANTUS) 100 unit/mL (3 mL) injection pen Inject 0.16 mL (16 Units total) under the skin nightly. 15 mL 11   ??? metoprolol tartrate (LOPRESSOR) 50 MG tablet TAKE 1 AND 1/2 TABLETS (75 MG) BY MOUTH TWICE DAILY 90 tablet PRN   ??? MYFORTIC 180 mg EC tablet TAKE 3 TABLETS (540 MG) BY MOUTH TWICE DAILY 180 tablet 11   ??? omeprazole (PRILOSEC) 20 MG capsule TAKE 1 CAPSULE BY MOUTH ONCE DAILY 30 capsule PRN   ??? pen needle, diabetic 32 gauge x 5/32 (4 mm) Ndle USE TO ADMINISTER INSULIN 4 TIMES DAILY 100 each PRN   ??? tacrolimus (PROGRAF) 1 MG capsule Take 6 capsules (6 mg total) by mouth two (2) times a day. 360 capsule 11 No current facility-administered medications for this visit.         Changes to medications: Azaiah reports no changes at this time.    Allergies   Allergen Reactions   ??? Iodine Other (See Comments)   ??? Iodine And Iodide Containing Products    ??? Shellfish Containing Products Other (See Comments)       Changes to allergies: No    SPECIALTY MEDICATION ADHERENCE     Myfortic 180mg   : 10 days of medicine on hand   Tacrolimus 1mg   : 10 days of medicine on hand       Medication Adherence    Patient reported X missed doses in the last month: 0  Specialty Medication: tacrolimus 1mg   Patient is on additional specialty medications: Yes  Additional Specialty Medications: Myfortic 180mg   Patient Reported Additional Medication X Missed Doses in the Last Month: 0  Adherence tools used: patient uses a pill box to manage medications  Support network for adherence: family member          Specialty medication(s) dose(s) confirmed: Regimen is correct and unchanged.     Are there any concerns with adherence? No    Adherence counseling provided? Not needed    CLINICAL MANAGEMENT AND INTERVENTION  Clinical Benefit Assessment:    Do you feel the medicine is effective or helping your condition? Yes    Clinical Benefit counseling provided? Not needed    Adverse Effects Assessment:    Are you experiencing any side effects? No    Are you experiencing difficulty administering your medicine? No    Quality of Life Assessment:    How many days over the past month did your transplant  keep you from your normal activities? For example, brushing your teeth or getting up in the morning. 0    Have you discussed this with your provider? Not needed    Therapy Appropriateness:    Is therapy appropriate? Yes, therapy is appropriate and should be continued    DISEASE/MEDICATION-SPECIFIC INFORMATION      N/A    PATIENT SPECIFIC NEEDS     ? Does the patient have any physical, cognitive, or cultural barriers? No ? Is the patient high risk? Yes, patient is taking a REMS drug. Medication is dispensed in compliance with REMS program.     ? Does the patient require a Care Management Plan? No     ? Does the patient require physician intervention or other additional services (i.e. nutrition, smoking cessation, social work)? No      SHIPPING     Specialty Medication(s) to be Shipped:   Transplant: Myfortic 180mg  and tacrolimus 1mg     Other medication(s) to be shipped: metoprolol, omeprazole, lantus, novolog, pen needles     Changes to insurance: No    Delivery Scheduled: Yes, Expected medication delivery date: 04/17/2019.  However, Rx request for refills was sent to the provider as there are none remaining.     Medication will be delivered via UPS to the confirmed prescription address in [facility]    The patient will receive a drug information handout for each medication shipped and additional FDA Medication Guides as required.  Verified that patient has previously received a Conservation officer, historic buildings.    All of the patient's questions and concerns have been addressed.    [name]   [facility] Specialty Pharmacist

## 2019-04-16 MED FILL — NOVOLOG FLEXPEN U-100 INSULIN ASPART 100 UNIT/ML (3 ML) SUBCUTANEOUS: 55 days supply | Qty: 30 | Fill #5 | Status: AC

## 2019-04-16 MED FILL — METOPROLOL TARTRATE 50 MG TABLET: 30 days supply | Qty: 90 | Fill #8 | Status: AC

## 2019-04-16 MED FILL — SURE COMFORT PEN NEEDLE 32 GAUGE X 5/32" (4 MM): 25 days supply | Qty: 100 | Fill #0 | Status: AC

## 2019-04-16 MED FILL — MYFORTIC 180 MG TABLET,DELAYED RELEASE: 30 days supply | Qty: 180 | Fill #11 | Status: AC

## 2019-04-16 MED FILL — LANTUS SOLOSTAR U-100 INSULIN 100 UNIT/ML (3 ML) SUBCUTANEOUS PEN: 94 days supply | Qty: 15 | Fill #2 | Status: AC

## 2019-04-16 MED FILL — TACROLIMUS 1 MG CAPSULE: 30 days supply | Qty: 360 | Fill #4 | Status: AC

## 2019-04-16 MED FILL — TACROLIMUS 1 MG CAPSULE, IMMEDIATE-RELEASE: ORAL | 30 days supply | Qty: 360 | Fill #4

## 2019-04-16 MED FILL — MYFORTIC 180 MG TABLET,DELAYED RELEASE: 30 days supply | Qty: 180 | Fill #11

## 2019-04-16 MED FILL — LANTUS SOLOSTAR U-100 INSULIN 100 UNIT/ML (3 ML) SUBCUTANEOUS PEN: SUBCUTANEOUS | 94 days supply | Qty: 15 | Fill #2

## 2019-04-16 MED FILL — SURE COMFORT PEN NEEDLE 32 GAUGE X 5/32" (4 MM): 25 days supply | Qty: 100 | Fill #0

## 2019-04-16 MED FILL — OMEPRAZOLE 20 MG CAPSULE,DELAYED RELEASE: ORAL | 30 days supply | Qty: 30 | Fill #8

## 2019-04-16 MED FILL — OMEPRAZOLE 20 MG CAPSULE,DELAYED RELEASE: 30 days supply | Qty: 30 | Fill #8 | Status: AC

## 2019-04-16 MED FILL — NOVOLOG FLEXPEN U-100 INSULIN ASPART 100 UNIT/ML (3 ML) SUBCUTANEOUS: 55 days supply | Qty: 30 | Fill #5

## 2019-04-16 MED FILL — METOPROLOL TARTRATE 50 MG TABLET: 30 days supply | Qty: 90 | Fill #8

## 2019-04-19 ENCOUNTER — Encounter: Payer: Medicare Other | Admitting: Podiatry

## 2019-04-22 NOTE — Progress Notes (Signed)
Subjective:  Patient ID: Anthony Blanchard, male    DOB: Jun 11, 1975,  MRN: [id number]  Chief Complaint  Patient presents with  . Wound Check    Right plantar wound check. Pt states he is healing well with no concerns. Denies fever/chills/nausea/vomiting.   44 y.o. male presents for wound care. Hx above confirmed with patient.  Here for care of the wound and further surgical discussion Review of Systems: Negative except as noted in the HPI. Denies N/V/F/Ch.  Past Medical History:  Diagnosis Date  . Abnormal results of thyroid function studies 08/16/2011  . Chronic kidney disease   . Coronary artery disease   . Diabetes mellitus without complication ([place])   . GERD (gastroesophageal reflux disease)   . Glaucoma   . Hypertension   . Overweight   . Partial blindness     Current Outpatient Medications:  .  aspirin EC 81 MG tablet, Take 81 mg by mouth., Disp: , Rfl:  .  atorvastatin (LIPITOR) 40 MG tablet, Take 1 tablet (40 mg total) by mouth daily., Disp: 90 tablet, Rfl: 3 .  Blood Glucose Monitoring Suppl (FIFTY50 GLUCOSE METER 2.0) w/Device KIT, Use as instructed, Disp: , Rfl:  .  Cholecalciferol (VITAMIN D) 2000 units tablet, Take by mouth., Disp: , Rfl:  .  dorzolamide-timolol (COSOPT) 22.3-6.8 MG/ML ophthalmic solution, INSTILL 1 DROP INTO LEFT EYE TWICE DAILY, Disp: , Rfl:  .  insulin glargine (LANTUS) 100 UNIT/ML injection, Inject 14 Units into the skin at bedtime. , Disp: , Rfl:  .  magnesium oxide (MAG-OX) 400 MG tablet, Take 400 mg by mouth daily., Disp: , Rfl:  .  mycophenolate (MYFORTIC) 180 MG EC tablet, TAKE 3 TABLETS (540 MG) BY MOUTH TWICE DAILY, Disp: , Rfl:  .  NOVOLOG FLEXPEN 100 UNIT/ML FlexPen, 8 units after breakfast, 8 units at lunch, and 6 units at bedtime, Disp: , Rfl:  .  silver sulfADIAZINE (SILVADENE) 1 % cream, Apply pea-sized amount to wound daily., Disp: 50 g, Rfl: 0 .  tacrolimus (PROGRAF) 1 MG capsule, TAKE 6 CAPSULES (6 MG TOTAL) BY MOUTH EVERY TWELVE (12)  HOURS., Disp: , Rfl:  .  tadalafil (CIALIS) 20 MG tablet, , Disp: , Rfl:  .  ULTICARE MICRO PEN NEEDLES 32G X 4 MM MISC, , Disp: , Rfl:  .  metoprolol tartrate (LOPRESSOR) 50 MG tablet, Take 75 mg by mouth 2 (two) times daily. , Disp: , Rfl:  .  omeprazole (PRILOSEC) 20 MG capsule, Take 20 mg by mouth., Disp: , Rfl:   Social History   Tobacco Use  Smoking Status Never Smoker  Smokeless Tobacco Never Used    Allergies  Allergen Reactions  . Shellfish Allergy Anaphylaxis  . Shellfish-Derived Products Anaphylaxis  . Iodinated Diagnostic Agents Other (See Comments)  . Iodine Other (See Comments)   Objective:  There were no vitals filed for this visit. There is no height or weight on file to calculate BMI. Constitutional Well developed. Well nourished.  Vascular Dorsalis pedis pulses palpable bilaterally. Posterior tibial pulses palpable bilaterally. Capillary refill normal to all digits.  No cyanosis or clubbing noted. Pedal hair growth normal.  Neurologic Normal speech. Oriented to person, place, and time. Protective sensation absent  Dermatologic Wound Location: R 5th MPJ Wound Base: Granular/Healthy Peri-wound: Calloused Exudate: None: wound tissue dry Wound Measurements: 1xx post-debridment  Orthopedic: No pain to palpation either foot.   Radiographs: None Assessment:   1. Diabetic ulcer of right foot associated with diabetes mellitus due to underlying condition,  limited to breakdown of skin, unspecified part of foot (Anthony Blanchard)   2. Deformity of metatarsal bone of right foot   3. Capsulitis of metatarsophalangeal (MTP) joint of right foot   4. Diabetic peripheral neuropathy associated with type 2 diabetes mellitus (Anthony Blanchard)   5. Hypertension, benign    Plan:  Patient was evaluated and treated and all questions answered.  Ulcer right foot; prominent 5th metatarsal head right -Discussed with patient that due to persistent ulceration who benefit from excision of the  metatarsal head with wound closure.  Patient amenable.  Consent form reviewed and signed by patient. -Patient has failed all conservative therapy and wishes to proceed with surgical intervention. All risks, benefits, and alternatives discussed with patient. No guarantees given. Consent reviewed and signed by patient. -Planned procedures: right fifth metatarsal head excision, wound excision and closure -Risk factors: Diabetes, kidney transplant, immunosuppressed state, CAD  Procedure: Excisional Debridement of Wound Rationale: Removal of non-viable soft tissue from the wound to promote healing.  Anesthesia: none Post-Debridement Wound Measurements: 1 cm x 1 cm x 0.3 cm  Type of Debridement: Sharp Excisional Tissue Removed: Non-viable soft tissue Depth of Debridement: subcutaneous tissue. Technique: Sharp excisional debridement to bleeding, viable wound base.  Dressing: Dry, sterile, compression dressing. Disposition: Patient tolerated procedure well. Patient to return in 1 week for follow-up.        No follow-ups on file.

## 2019-04-30 DIAGNOSIS — Z940 Kidney transplant status: Principal | ICD-10-CM

## 2019-05-03 ENCOUNTER — Encounter: Payer: Medicare Other | Admitting: Podiatry

## 2019-05-06 DIAGNOSIS — Z940 Kidney transplant status: Principal | ICD-10-CM

## 2019-05-06 DIAGNOSIS — Z79899 Other long term (current) drug therapy: Principal | ICD-10-CM

## 2019-05-09 ENCOUNTER — Encounter: Admit: 2019-05-09 | Discharge: 2019-05-09 | Payer: MEDICARE

## 2019-05-09 ENCOUNTER — Ambulatory Visit: Admit: 2019-05-09 | Discharge: 2019-05-09 | Payer: MEDICARE | Attending: Nephrology | Primary: Nephrology

## 2019-05-09 DIAGNOSIS — Z79899 Other long term (current) drug therapy: Principal | ICD-10-CM

## 2019-05-09 DIAGNOSIS — D899 Disorder involving the immune mechanism, unspecified: Principal | ICD-10-CM

## 2019-05-09 DIAGNOSIS — Z940 Kidney transplant status: Principal | ICD-10-CM

## 2019-05-09 LAB — CBC W/ AUTO DIFF
BASOPHILS ABSOLUTE COUNT: 0 10*9/L (ref 0.0–0.1)
BASOPHILS RELATIVE PERCENT: 0.2 %
EOSINOPHILS ABSOLUTE COUNT: 0 10*9/L (ref 0.0–0.4)
EOSINOPHILS RELATIVE PERCENT: 0.3 %
HEMATOCRIT: 45.8 % (ref 41.0–53.0)
HEMOGLOBIN: 13.4 g/dL — ABNORMAL LOW (ref 13.5–17.5)
LARGE UNSTAINED CELLS: 2 % (ref 0–4)
LYMPHOCYTES ABSOLUTE COUNT: 1.2 10*9/L — ABNORMAL LOW (ref 1.5–5.0)
LYMPHOCYTES RELATIVE PERCENT: 13.7 %
MEAN CORPUSCULAR HEMOGLOBIN CONC: 29.2 g/dL — ABNORMAL LOW (ref 31.0–37.0)
MEAN CORPUSCULAR HEMOGLOBIN: 25.3 pg — ABNORMAL LOW (ref 26.0–34.0)
MEAN CORPUSCULAR VOLUME: 86.6 fL (ref 80.0–100.0)
MONOCYTES ABSOLUTE COUNT: 0.4 10*9/L (ref 0.2–0.8)
NEUTROPHILS ABSOLUTE COUNT: 6.8 10*9/L (ref 2.0–7.5)
NEUTROPHILS RELATIVE PERCENT: 79.3 %
PLATELET COUNT: 174 10*9/L (ref 150–440)
RED BLOOD CELL COUNT: 5.29 10*12/L (ref 4.50–5.90)
RED CELL DISTRIBUTION WIDTH: 15.8 % — ABNORMAL HIGH (ref 12.0–15.0)
WBC ADJUSTED: 8.6 10*9/L (ref 4.5–11.0)

## 2019-05-09 LAB — MAGNESIUM: Magnesium:MCnc:Pt:Ser/Plas:Qn:: 1.6

## 2019-05-09 LAB — PROTEIN / CREATININE RATIO, URINE
CREATININE, URINE: 193 mg/dL
PROTEIN URINE: 21.6 mg/dL

## 2019-05-09 LAB — URINALYSIS
BACTERIA: NONE SEEN /HPF
BILIRUBIN UA: NEGATIVE
BLOOD UA: NEGATIVE
GLUCOSE UA: NEGATIVE
KETONES UA: NEGATIVE
LEUKOCYTE ESTERASE UA: NEGATIVE
NITRITE UA: NEGATIVE
PROTEIN UA: 30 — AB
RBC UA: 1 /HPF (ref <=3)
SPECIFIC GRAVITY UA: 1.019 (ref 1.003–1.030)
SQUAMOUS EPITHELIAL: <1 /HPF (ref 0–5)
UROBILINOGEN UA: 0.2
WBC UA: <1 /HPF (ref <=2)

## 2019-05-09 LAB — BASIC METABOLIC PANEL
ANION GAP: 11 mmol/L (ref 7–15)
BLOOD UREA NITROGEN: 24 mg/dL — ABNORMAL HIGH (ref 7–21)
CALCIUM: 9.9 mg/dL (ref 8.5–10.2)
CHLORIDE: 105 mmol/L (ref 98–107)
CO2: 24 mmol/L (ref 22.0–30.0)
CREATININE: 1.45 mg/dL — ABNORMAL HIGH (ref 0.70–1.30)
EGFR CKD-EPI AA MALE: 68 mL/min/{1.73_m2} (ref >=60)
EGFR CKD-EPI NON-AA MALE: 59 mL/min/{1.73_m2} — ABNORMAL LOW (ref >=60)
GLUCOSE RANDOM: 138 mg/dL — ABNORMAL HIGH (ref 70–99)
SODIUM: 140 mmol/L (ref 135–145)

## 2019-05-09 LAB — CREATININE, URINE: Lab: 193

## 2019-05-09 LAB — GLUCOSE UA: Glucose:MCnc:Pt:Urine:Qn:Test strip: NEGATIVE

## 2019-05-09 LAB — CREATININE: Creatinine:MCnc:Pt:Ser/Plas:Qn:: 1.45 — ABNORMAL HIGH

## 2019-05-09 LAB — PHOSPHORUS: Phosphate:MCnc:Pt:Ser/Plas:Qn:: 2.9

## 2019-05-09 LAB — TACROLIMUS, TROUGH: Lab: 6.8

## 2019-05-09 LAB — MEAN CORPUSCULAR HEMOGLOBIN: Erythrocyte mean corpuscular hemoglobin:EntMass:Pt:RBC:Qn:Automated count: 25.3 — ABNORMAL LOW

## 2019-05-09 LAB — HEMOGLOBIN A1C: Hemoglobin A1c/Hemoglobin.total:MFr:Pt:Bld:Qn:: 7.5 — ABNORMAL HIGH
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
## 2019-05-09 NOTE — Unmapped (Signed)
Transplant Coordinator, Clinic Visit   Pt seen today by transplant nephrology for follow up, reviewed medications and symptoms.   Assessment  Fevers: Denies  Chills/sweats: Denies  Headache: Denies  Hand tremors: Denies  Numbness/tingling: Denies  Shortness of breath: Denies  Chest pain or pressure: Denies  Palpitations: Denies  Nausea/vomiting: Denies  Diarrhea/constipation: Denies  UTI symptoms: Denies  Swelling: Denies  Sleep: ok  Pain: Right foot ulcer sometimes  Good appetite; reports adequate hydration.   Any new medications?Reports taking no new meds  Immunosuppressant last taken: 9:12pm 05/08/2019

## 2019-05-09 NOTE — Unmapped (Signed)
AOBP performed left arm, medium cuff.   1st reading - 138/79, p 71   2nd reading - 145/84, p 70    3rd reading- 138/83, p 71   Average - 140/82, p 71

## 2019-05-10 LAB — CMV VIRAL LD: Lab: NOT DETECTED

## 2019-05-10 LAB — CMV DNA, QUANTITATIVE, PCR

## 2019-05-11 DIAGNOSIS — Z940 Kidney transplant status: Principal | ICD-10-CM

## 2019-05-11 DIAGNOSIS — T8611 Kidney transplant rejection: Principal | ICD-10-CM

## 2019-05-11 LAB — BK BLOOD COMMENT: Lab: 0

## 2019-05-11 LAB — EBV VIRAL LOAD RESULT: Lab: NOT DETECTED

## 2019-05-11 LAB — BK VIRUS QUANTITATIVE PCR, BLOOD

## 2019-05-11 MED ORDER — MYFORTIC 180 MG TABLET,DELAYED RELEASE
ORAL_TABLET | Freq: Two times a day (BID) | ORAL | 11 refills | 30 days | Status: CP
Start: 2019-05-11 — End: 2020-05-10

## 2019-05-11 NOTE — Unmapped (Addendum)
[facility] Refill Coordination Note    Specialty Medication(s) to be Shipped:   Transplant: Myfortic 180mg  and tacrolimus 1mg    **Do to copay issues tacrolimus was delivered on 2/5 and we are still waiting on the referral for Myfortic**    Other medication(s) to be shipped: metoprolol tart 50mg , omeprazole 20mg , and pen needles     Benjamin Maldonado, DOB: 01/30/1976  Phone: [phone] (home)       All above HIPAA information was verified with patient.     Was a Nurse, learning disability used for this call? No    Completed refill call assessment today to schedule patient's medication shipment from the [facility] [phone]).       Specialty medication(s) and dose(s) confirmed: Regimen is correct and unchanged.   Changes to medications: [name] reports no changes at this time.  Changes to insurance: No  Questions for the pharmacist: No    Confirmed patient received Welcome Packet with first shipment. The patient will receive a drug information handout for each medication shipped and additional FDA Medication Guides as required.       DISEASE/MEDICATION-SPECIFIC INFORMATION        N/A    SPECIALTY MEDICATION ADHERENCE     Medication Adherence    Patient reported X missed doses in the last month: 0  Specialty Medication: Tacrolimus 1mg   Patient is on additional specialty medications: Yes  Additional Specialty Medications: Myfortic 180mg   Patient Reported Additional Medication X Missed Doses in the Last Month: 0  Patient is on more than two specialty medications: No  Adherence tools used: patient uses a pill box to manage medications  Support network for adherence: family member          Tacrolimus 1 mg: 10 days of medicine on hand   Myfortic 180 mg: 10 days of medicine on hand     SHIPPING     Shipping address confirmed in Epic.     Delivery Scheduled: Yes, Expected medication delivery date: 05/18/2019.     Medication will be delivered via UPS to the prescription address in Epic [facility].    Lorelei Pont [facility] Specialty Technician

## 2019-05-11 NOTE — Unmapped (Signed)
Per test claim for Myfortic at the [facility], patient needs Medication Assistance Program for High Copay.

## 2019-05-16 LAB — FSAB CLASS 2 ANTIBODY SPECIFICITY: HLA CL2 AB RESULT: NEGATIVE

## 2019-05-16 LAB — HLA DS POST TRANSPLANT
ANTI-DONOR DRW #1 MFI: 142 MFI
ANTI-DONOR HLA-A #1 MFI: 93 MFI
ANTI-DONOR HLA-A #2 MFI: 87 MFI
ANTI-DONOR HLA-B #1 MFI: 85 MFI
ANTI-DONOR HLA-B #2 MFI: 53 MFI
ANTI-DONOR HLA-C #2 MFI: 106 MFI
ANTI-DONOR HLA-DP AG #1 MFI: 71 MFI
ANTI-DONOR HLA-DQB #1 MFI: 112 MFI
ANTI-DONOR HLA-DQB #2 MFI: 34 MFI
ANTI-DONOR HLA-DR #1 MFI: 52 MFI
ANTI-DONOR HLA-DR #2 MFI: 5 MFI

## 2019-05-16 LAB — ANTI-DONOR HLA-DP AG #1 MFI: Lab: 71

## 2019-05-16 LAB — HLA CL1 ANTIBODY COMM: Lab: 0

## 2019-05-16 LAB — HLA CL2 AB COMMENT: Lab: 0
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
## 2019-05-24 MED FILL — ULTICARE PEN NEEDLE 32 GAUGE X 5/32" (4 MM): 25 days supply | Qty: 100 | Fill #1 | Status: AC

## 2019-05-24 MED FILL — TACROLIMUS 1 MG CAPSULE, IMMEDIATE-RELEASE: ORAL | 30 days supply | Qty: 360 | Fill #5

## 2019-05-24 MED FILL — OMEPRAZOLE 20 MG CAPSULE,DELAYED RELEASE: ORAL | 30 days supply | Qty: 30 | Fill #9

## 2019-05-24 MED FILL — METOPROLOL TARTRATE 50 MG TABLET: 30 days supply | Qty: 90 | Fill #9 | Status: AC

## 2019-05-24 MED FILL — METOPROLOL TARTRATE 50 MG TABLET: 30 days supply | Qty: 90 | Fill #9

## 2019-05-24 MED FILL — OMEPRAZOLE 20 MG CAPSULE,DELAYED RELEASE: 30 days supply | Qty: 30 | Fill #9 | Status: AC

## 2019-05-24 MED FILL — ULTICARE PEN NEEDLE 32 GAUGE X 5/32" (4 MM): 25 days supply | Qty: 100 | Fill #1

## 2019-05-24 MED FILL — TACROLIMUS 1 MG CAPSULE: 30 days supply | Qty: 360 | Fill #5 | Status: AC

## 2019-05-25 ENCOUNTER — Ambulatory Visit: Payer: Medicare Other | Admitting: Primary Care

## 2019-05-25 DIAGNOSIS — Z940 Kidney transplant status: Principal | ICD-10-CM

## 2019-05-25 MED ORDER — MYCOPHENOLATE SODIUM 180 MG TABLET,DELAYED RELEASE
ORAL_TABLET | Freq: Two times a day (BID) | ORAL | 11 refills | 30 days | Status: CP
Start: 2019-05-25 — End: 2020-05-24
  Filled 2019-05-25: qty 180, 30d supply, fill #0

## 2019-05-25 MED FILL — MYCOPHENOLATE SODIUM 180 MG TABLET,DELAYED RELEASE: 30 days supply | Qty: 180 | Fill #0 | Status: AC

## 2019-05-25 NOTE — Unmapped (Signed)
Benjamin Maldonado 's MYCOPHENOLATE shipment will be sent out  as a result of copay is now approved by patient/caregiver.      I have reached out to the patient and communicated the delivery change. We will reschedule the medication for the delivery date that the patient agreed upon.  We have confirmed the delivery date as 05/28/19, via ups.

## 2019-05-28 DIAGNOSIS — Z940 Kidney transplant status: Principal | ICD-10-CM

## 2019-06-18 MED ORDER — INSULIN ASPART (U-100) 100 UNIT/ML (3 ML) SUBCUTANEOUS PEN
11 refills | 0 days | Status: CP
Start: 2019-06-18 — End: ?
  Filled 2019-06-21: qty 30, 55d supply, fill #0

## 2019-06-18 NOTE — Unmapped (Addendum)
[facility] Refill Coordination Note    Specialty Medication(s) to be Shipped:   Transplant: mycophenolate mofetil 180mg  and tacrolimus 1mg     Other medication(s) to be shipped: novolog flexpen (sent rf request, metoprolol tart 50mg , omperazole 20mg , and pen needles     Benjamin Maldonado, DOB: 01/20/1976  Phone: [phone] (home)       All above HIPAA information was verified with patient.     Was a Nurse, learning disability used for this call? No    Completed refill call assessment today to schedule patient's medication shipment from the [facility] [phone]).       Specialty medication(s) and dose(s) confirmed: Regimen is correct and unchanged.   Changes to medications: Freeland reports no changes at this time.  Changes to insurance: No  Questions for the pharmacist: No    Confirmed patient received Welcome Packet with first shipment. The patient will receive a drug information handout for each medication shipped and additional FDA Medication Guides as required.       DISEASE/MEDICATION-SPECIFIC INFORMATION        N/A    SPECIALTY MEDICATION ADHERENCE     Medication Adherence    Patient reported X missed doses in the last month: 2  Specialty Medication: Mycophenolate 180mg   Patient is on additional specialty medications: Yes  Additional Specialty Medications: Tacrolimus 1mg   Patient Reported Additional Medication X Missed Doses in the Last Month: 2  Adherence tools used: patient uses a pill box to manage medications  Support network for adherence: family member          Mycophenolate 180 mg: 8 days of medicine on hand   Tacrolimus 1 mg: 8 days of medicine on hand     SHIPPING     Shipping address confirmed in Epic.     Delivery Scheduled: Yes, Expected medication delivery date: 06/22/2019.     Medication will be delivered via UPS to the prescription address in Epic WAM.    Lorelei Pont [facility] Specialty Technician

## 2019-06-21 MED FILL — TACROLIMUS 1 MG CAPSULE: 30 days supply | Qty: 360 | Fill #6 | Status: AC

## 2019-06-21 MED FILL — TACROLIMUS 1 MG CAPSULE, IMMEDIATE-RELEASE: ORAL | 30 days supply | Qty: 360 | Fill #6

## 2019-06-21 MED FILL — ULTICARE PEN NEEDLE 32 GAUGE X 5/32" (4 MM): 25 days supply | Qty: 100 | Fill #2 | Status: AC

## 2019-06-21 MED FILL — OMEPRAZOLE 20 MG CAPSULE,DELAYED RELEASE: ORAL | 30 days supply | Qty: 30 | Fill #10

## 2019-06-21 MED FILL — NOVOLOG FLEXPEN U-100 INSULIN ASPART 100 UNIT/ML (3 ML) SUBCUTANEOUS: 55 days supply | Qty: 30 | Fill #0 | Status: AC

## 2019-06-21 MED FILL — METOPROLOL TARTRATE 50 MG TABLET: 30 days supply | Qty: 90 | Fill #10

## 2019-06-21 MED FILL — MYCOPHENOLATE SODIUM 180 MG TABLET,DELAYED RELEASE: ORAL | 30 days supply | Qty: 180 | Fill #1

## 2019-06-21 MED FILL — OMEPRAZOLE 20 MG CAPSULE,DELAYED RELEASE: 30 days supply | Qty: 30 | Fill #10 | Status: AC

## 2019-06-21 MED FILL — METOPROLOL TARTRATE 50 MG TABLET: 30 days supply | Qty: 90 | Fill #10 | Status: AC

## 2019-06-21 MED FILL — ULTICARE PEN NEEDLE 32 GAUGE X 5/32" (4 MM): 25 days supply | Qty: 100 | Fill #2

## 2019-06-21 MED FILL — MYCOPHENOLATE SODIUM 180 MG TABLET,DELAYED RELEASE: 30 days supply | Qty: 180 | Fill #1 | Status: AC

## 2019-06-25 DIAGNOSIS — Z940 Kidney transplant status: Principal | ICD-10-CM

## 2019-07-08 NOTE — Progress Notes (Deleted)
Cardiology Office Note:   Date:  07/08/2019  NAME:  Anthony Blanchard    MRN: [id number] DOB:  [birth year]   PCP:  Pleas Koch, NP  Cardiologist:  Evalina Field, MD  Electrophysiologist:  None   Referring MD: Pleas Koch, NP   No chief complaint on file. ***  History of Present Illness:   Anthony Blanchard is a 44 y.o. male with a hx of ESRD s/p renal transplant, CAD s/p PCI, diabetes, HTN, HLD who presents for follow-up of HLD. LDL was not at goal last year. We increased his lipitor to 40 mg QHS.   Problem List 1. ESRD s/p renal transplant  -2016 @ [facility] 2. CAD -PCI to RCA 2009 -PCI to LAD 2013 3. Diabetes -A1c 7.2 4. HTN 5. HLD -T chol 164, HDL 42, LDL 92, TG 150   Past Medical History: Past Medical History:  Diagnosis Date  . Abnormal results of thyroid function studies 08/16/2011  . Chronic kidney disease   . Coronary artery disease   . Diabetes mellitus without complication ([place])   . GERD (gastroesophageal reflux disease)   . Glaucoma   . Hypertension   . Overweight   . Partial blindness     Past Surgical History: Past Surgical History:  Procedure Laterality Date  . CARDIAC CATHETERIZATION    . 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    Current Medications: No outpatient medications have been marked as taking for the 07/09/19 encounter (Appointment) with O'Neal, Cassie Freer, MD.     Allergies:    Shellfish allergy, Shellfish-derived products, Iodinated diagnostic agents, and Iodine   Social History: Social History   Socioeconomic History  . Marital status: Single    Spouse name: Not on file  . Number of children: Not on file  . Years of education: Not on file  . Highest education level: Not on file  Occupational History  . Not on file  Tobacco Use  . Smoking status: Never Smoker  . Smokeless tobacco: Never Used  Substance and Sexual Activity  . Alcohol use: Not Currently  . Drug use: Not on file  . Sexual activity: Not on file  Other Topics  Concern  . Not on file  Social History Narrative  . Not on file   Social Determinants of Health   Financial Resource Strain:   . Difficulty of Paying Living Expenses:   Food Insecurity:   . Worried About Charity fundraiser in the Last Year:   . Arboriculturist in the Last Year:   Transportation Needs:   . Film/video editor (Medical):   Marland Kitchen Lack of Transportation (Non-Medical):   Physical Activity:   . Days of Exercise per Week:   . Minutes of Exercise per Session:   Stress:   . Feeling of Stress :   Social Connections:   . Frequency of Communication with Friends and Family:   . Frequency of Social Gatherings with Friends and Family:   . Attends Religious Services:   . Active Member of Clubs or Organizations:   . Attends Archivist Meetings:   Marland Kitchen Marital Status:      Family History: The patient's ***family history includes Alcohol abuse in his father; Diabetes in his mother.  ROS:   All other ROS reviewed and negative. Pertinent positives noted in the HPI.     EKGs/Labs/Other Studies Reviewed:   The following studies were personally reviewed by me today:  EKG:  EKG is *** ordered  today.  The ekg ordered today demonstrates ***, and was personally reviewed by me.   Recent Labs: No results found for requested labs within last 8760 hours.   Recent Lipid Panel No results found for: CHOL, TRIG, HDL, CHOLHDL, VLDL, LDLCALC, LDLDIRECT  Physical Exam:   VS:  There were no vitals taken for this visit.   Wt Readings from Last 3 Encounters:  03/07/19 236 lb 9.6 oz (107.3 kg)  02/22/19 234 lb 4 oz (106.3 kg)  04/04/17 222 lb (100.7 kg)    General: Well nourished, well developed, in no acute distress Heart: Atraumatic, normal size  Eyes: PEERLA, EOMI  Neck: Supple, no JVD Endocrine: No thryomegaly Cardiac: Normal S1, S2; RRR; no murmurs, rubs, or gallops Lungs: Clear to auscultation bilaterally, no wheezing, rhonchi or rales  Abd: Soft, nontender, no  hepatomegaly  Ext: No edema, pulses 2+ Musculoskeletal: No deformities, BUE and BLE strength normal and equal Skin: Warm and dry, no rashes   Neuro: Alert and oriented to person, place, time, and situation, CNII-XII grossly intact, no focal deficits  Psych: Normal mood and affect   ASSESSMENT:   ELLEN Blanchard is a 44 y.o. male who presents for the following: No diagnosis found.  PLAN:   There are no diagnoses linked to this encounter.  Disposition: No follow-ups on file.  Medication Adjustments/Labs and Tests Ordered: Current medicines are reviewed at length with the patient today.  Concerns regarding medicines are outlined above.  No orders of the defined types were placed in this encounter.  No orders of the defined types were placed in this encounter.   There are no Patient Instructions on file for this visit.   Time Spent with Patient: I have spent a total of *** minutes with patient reviewing hospital notes, telemetry, EKGs, labs and examining the patient as well as establishing an assessment and plan that was discussed with the patient.  > 50% of time was spent in direct patient care.  Signed, Addison Naegeli. Audie Box, [place]  [address], [place], [place] [address] [phone]  07/08/2019 10:19 AM

## 2019-07-09 ENCOUNTER — Ambulatory Visit: Payer: Medicare HMO | Admitting: Cardiovascular Disease

## 2019-07-09 MED ORDER — INSULIN GLARGINE (U-100) 100 UNIT/ML (3 ML) SUBCUTANEOUS PEN
Freq: Every evening | SUBCUTANEOUS | 5 refills | 93 days | Status: CP
Start: 2019-07-09 — End: ?
  Filled 2019-08-15: qty 15, 90d supply, fill #0

## 2019-07-11 NOTE — Unmapped (Signed)
[facility] Clinical Assessment & Refill Coordination Note    Benjamin Maldonado, DOB: 1975-09-13  Phone: [phone] (home)     All above HIPAA information was verified with patient.     Was a Nurse, learning disability used for this call? 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 acetaminophen (TYLENOL) 325 MG tablet Take 2 tablets (650 mg total) by mouth every six (6) hours as needed for pain. (Patient not taking: Reported on 05/09/2019) 100 tablet 2   ??? aspirin (ADULT LOW DOSE ASPIRIN) 81 MG tablet Take 1 tablet (81 mg total) by mouth daily. 30 tablet 11   ??? atorvastatin (LIPITOR) 20 MG tablet Take 1 tablet (20 mg total) by mouth daily. 90 tablet 3   ??? blood sugar diagnostic Strp by Other route Four (4) times a day (before meals and nightly). One Touch 100 strip 5   ??? blood-glucose meter (GLUCOSE MONITORING KIT) kit Use as instructed 1 each 0   ??? dorzolamide-timoloL (COSOPT) 22.3-6.8 mg/mL ophthalmic solution INSTILL 1 DROP INTO LEFT EYE TWICE DAILY     ??? GLUC.METER,DIS.P-LOADED STRIPS (GLUCOSE METER, DISP & STRIPS MISC) Frequency:PHARMDIR   Dosage:0.0     Instructions:  Note:250.00, life long Dose: 1     ??? insulin ASPART (NOVOLOG FLEXPEN U-100 INSULIN) 100 unit/mL (3 mL) injection pen Inject 4 units under skin with breakfast, 8 units with lunch, and 6 units with supper and sliding scale. Max units 54 per day. 30 mL 11   ??? insulin glargine (BASAGLAR, LANTUS) 100 unit/mL (3 mL) injection pen Inject 0.16 mL (16 Units total) under the skin nightly. 15 mL 5   ??? magnesium oxide (MAG-OX) 400 mg (241.3 mg magnesium) tablet Take 400 mg by mouth.     ??? metoprolol tartrate (LOPRESSOR) 50 MG tablet TAKE 1 AND 1/2 TABLETS (75 MG) BY MOUTH TWICE DAILY 90 tablet PRN   ??? mycophenolate (MYFORTIC) 180 MG EC tablet Take 3 tablets (540 mg total) by mouth two (2) times a day. 180 tablet 11   ??? omeprazole (PRILOSEC) 20 MG capsule TAKE 1 CAPSULE BY MOUTH ONCE DAILY 30 capsule PRN   ??? pen needle, diabetic 32 gauge x 5/32 (4 mm) Ndle Use to administer insulin 4 times daily. 100 each PRN   ??? silver sulfaDIAZINE (SILVADENE, SSD) 1 % cream Apply pea-sized amount to wound daily.     ??? tacrolimus (PROGRAF) 1 MG capsule Take 6 capsules (6 mg total) by mouth two (2) times a day. 360 capsule 11   ??? tadalafiL 20 MG tablet Take 20 mg by mouth every other day.       No current facility-administered medications for this visit.         Changes to medications: Ikechukwu reports no changes at this time.    Allergies   Allergen Reactions   ??? Iodine Other (See Comments)   ??? Iodine And Iodide Containing Products    ??? Shellfish Containing Products Other (See Comments)       Changes to allergies: No    SPECIALTY MEDICATION ADHERENCE     Tacrolimus 1mg   : 10 days of medicine on hand   Mycophenolate 180mg   : 10 days of medicine on hand     Medication Adherence    Patient reported X missed doses in the last month: 0  Specialty Medication: tacrolimus  1mg   Patient is on additional specialty medications: Yes  Additional Specialty Medications: Mycophenolate 180mg   Patient Reported Additional Medication X Missed Doses in the Last Month: 0  Adherence tools used: patient uses a pill box to manage medications  Support network for adherence: family member          Specialty medication(s) dose(s) confirmed: Regimen is correct and unchanged.     Are there any concerns with adherence? No    Adherence counseling provided? Not needed    CLINICAL MANAGEMENT AND INTERVENTION      Clinical Benefit Assessment:    Do you feel the medicine is effective or helping your condition? Yes    Clinical Benefit counseling provided? Not needed    Adverse Effects Assessment:    Are you experiencing any side effects? No    Are you experiencing difficulty administering your medicine? No    Quality of Life Assessment:    How many days over the past month did your transplant  keep you from your normal activities? For example, brushing your teeth or getting up in the morning. 0    Have you discussed this with your provider? Not needed    Therapy Appropriateness:    Is therapy appropriate? Yes, therapy is appropriate and should be continued    DISEASE/MEDICATION-SPECIFIC INFORMATION      N/A    PATIENT SPECIFIC NEEDS     ? Does the patient have any physical, cognitive, or cultural barriers? No    ? Is the patient high risk? Yes, patient is taking a REMS drug. Medication is dispensed in compliance with REMS program.     ? Does the patient require a Care Management Plan? No     ? Does the patient require physician intervention or other additional services (i.e. nutrition, smoking cessation, social work)? No      SHIPPING     Specialty Medication(s) to be Shipped:   Transplant:  mycophenolic acid 180mg  and tacrolimus 1mg     Other medication(s) to be shipped: metoprolol, omeprazole, pen needles  Pt does not need insulins at this time     Changes to insurance: No    Delivery Scheduled: Yes, Expected medication delivery date: 07/18/2019.     Medication will be delivered via UPS to the confirmed prescription address in [facility].    The patient will receive a drug information handout for each medication shipped and additional FDA Medication Guides as required.  Verified that patient has previously received a Conservation officer, historic buildings.    All of the patient's questions and concerns have been addressed.    [name]   [facility] Specialty Pharmacist

## 2019-07-17 MED FILL — MYCOPHENOLATE SODIUM 180 MG TABLET,DELAYED RELEASE: 30 days supply | Qty: 180 | Fill #2 | Status: AC

## 2019-07-17 MED FILL — OMEPRAZOLE 20 MG CAPSULE,DELAYED RELEASE: ORAL | 30 days supply | Qty: 30 | Fill #11

## 2019-07-17 MED FILL — TACROLIMUS 1 MG CAPSULE, IMMEDIATE-RELEASE: 30 days supply | Qty: 360 | Fill #7 | Status: AC

## 2019-07-17 MED FILL — TACROLIMUS 1 MG CAPSULE, IMMEDIATE-RELEASE: ORAL | 30 days supply | Qty: 360 | Fill #7

## 2019-07-17 MED FILL — OMEPRAZOLE 20 MG CAPSULE,DELAYED RELEASE: 30 days supply | Qty: 30 | Fill #11 | Status: AC

## 2019-07-17 MED FILL — METOPROLOL TARTRATE 50 MG TABLET: 30 days supply | Qty: 90 | Fill #11

## 2019-07-17 MED FILL — ULTICARE PEN NEEDLE 32 GAUGE X 5/32" (4 MM): 25 days supply | Qty: 100 | Fill #3

## 2019-07-17 MED FILL — MYCOPHENOLATE SODIUM 180 MG TABLET,DELAYED RELEASE: ORAL | 30 days supply | Qty: 180 | Fill #2

## 2019-07-17 MED FILL — METOPROLOL TARTRATE 50 MG TABLET: 30 days supply | Qty: 90 | Fill #11 | Status: AC

## 2019-07-17 MED FILL — ULTICARE PEN NEEDLE 32 GAUGE X 5/32" (4 MM): 25 days supply | Qty: 100 | Fill #3 | Status: AC

## 2019-07-23 DIAGNOSIS — Z940 Kidney transplant status: Principal | ICD-10-CM

## 2019-07-24 ENCOUNTER — Encounter: Payer: Self-pay | Admitting: Cardiovascular Disease

## 2019-07-25 ENCOUNTER — Telehealth: Payer: Self-pay | Admitting: Cardiovascular Disease

## 2019-07-25 NOTE — Telephone Encounter (Signed)
LVM RE: F/U Visit-- AF 

## 2019-08-08 NOTE — Unmapped (Signed)
[facility] Refill Coordination Note    Specialty Medication(s) to be Shipped:   Transplant: mycophenolate mofetil 180mg  and tacrolimus 1mg     Other medication(s) to be shipped: Lantus pens, metoprolol tart 50mg , novolog pens, omeprazole 20mg  and pen needles     Benjamin Maldonado, DOB: Aug 11, 1975  Phone: [phone] (home)       All above HIPAA information was verified with patient.     Was a Nurse, learning disability used for this call? No    Completed refill call assessment today to schedule patient's medication shipment from the [facility] [phone]).       Specialty medication(s) and dose(s) confirmed: Regimen is correct and unchanged.   Changes to medications: Tailor reports no changes at this time.  Changes to insurance: No  Questions for the pharmacist: No    Confirmed patient received Welcome Packet with first shipment. The patient will receive a drug information handout for each medication shipped and additional FDA Medication Guides as required.       DISEASE/MEDICATION-SPECIFIC INFORMATION        N/A    SPECIALTY MEDICATION ADHERENCE     Medication Adherence    Patient reported X missed doses in the last month: 1  Specialty Medication: Mycophenolate 180mg   Patient is on additional specialty medications: Yes  Additional Specialty Medications: Tacrolimus 1mg    Patient Reported Additional Medication X Missed Doses in the Last Month: 1  Patient is on more than two specialty medications: No  Adherence tools used: patient uses a pill box to manage medications  Support network for adherence: family member          Mycophenolate 180 mg: 8 days of medicine on hand   Tacrolimus 1 mg: 8 days of medicine on hand     SHIPPING     Shipping address confirmed in Epic.     Delivery Scheduled: Yes, Expected medication delivery date: 08/15/2019.     Medication will be delivered via UPS to the prescription address in Epic WAM.    Lorelei Pont [facility] Specialty Technician

## 2019-08-14 MED ORDER — METOPROLOL TARTRATE 50 MG TABLET
ORAL_TABLET | PRN refills | 0 days | Status: CP
Start: 2019-08-14 — End: 2020-08-13
  Filled 2019-08-15: qty 90, 30d supply, fill #0

## 2019-08-15 DIAGNOSIS — D849 Immunodeficiency, unspecified: Secondary | ICD-10-CM | POA: Diagnosis not present

## 2019-08-15 DIAGNOSIS — E1122 Type 2 diabetes mellitus with diabetic chronic kidney disease: Secondary | ICD-10-CM | POA: Diagnosis not present

## 2019-08-15 DIAGNOSIS — Z940 Kidney transplant status: Secondary | ICD-10-CM | POA: Diagnosis not present

## 2019-08-15 DIAGNOSIS — E871 Hypo-osmolality and hyponatremia: Secondary | ICD-10-CM | POA: Insufficient documentation

## 2019-08-15 DIAGNOSIS — R079 Chest pain, unspecified: Secondary | ICD-10-CM | POA: Diagnosis not present

## 2019-08-15 DIAGNOSIS — R0789 Other chest pain: Secondary | ICD-10-CM | POA: Diagnosis not present

## 2019-08-15 DIAGNOSIS — R739 Hyperglycemia, unspecified: Secondary | ICD-10-CM | POA: Diagnosis not present

## 2019-08-15 DIAGNOSIS — E1165 Type 2 diabetes mellitus with hyperglycemia: Secondary | ICD-10-CM | POA: Diagnosis not present

## 2019-08-15 DIAGNOSIS — N19 Unspecified kidney failure: Secondary | ICD-10-CM | POA: Diagnosis not present

## 2019-08-15 DIAGNOSIS — R55 Syncope and collapse: Secondary | ICD-10-CM | POA: Insufficient documentation

## 2019-08-15 DIAGNOSIS — N186 End stage renal disease: Secondary | ICD-10-CM | POA: Diagnosis not present

## 2019-08-15 DIAGNOSIS — Z794 Long term (current) use of insulin: Secondary | ICD-10-CM | POA: Diagnosis not present

## 2019-08-15 DIAGNOSIS — I2510 Atherosclerotic heart disease of native coronary artery without angina pectoris: Secondary | ICD-10-CM | POA: Diagnosis not present

## 2019-08-15 DIAGNOSIS — I120 Hypertensive chronic kidney disease with stage 5 chronic kidney disease or end stage renal disease: Secondary | ICD-10-CM | POA: Diagnosis not present

## 2019-08-15 DIAGNOSIS — I10 Essential (primary) hypertension: Secondary | ICD-10-CM | POA: Diagnosis not present

## 2019-08-15 MED FILL — METOPROLOL TARTRATE 50 MG TABLET: 30 days supply | Qty: 90 | Fill #0 | Status: AC

## 2019-08-15 MED FILL — OMEPRAZOLE 20 MG CAPSULE,DELAYED RELEASE: ORAL | 30 days supply | Qty: 30 | Fill #12

## 2019-08-15 MED FILL — MYCOPHENOLATE SODIUM 180 MG TABLET,DELAYED RELEASE: ORAL | 30 days supply | Qty: 180 | Fill #3

## 2019-08-15 MED FILL — NOVOLOG FLEXPEN U-100 INSULIN ASPART 100 UNIT/ML (3 ML) SUBCUTANEOUS: 55 days supply | Qty: 30 | Fill #1

## 2019-08-15 MED FILL — TACROLIMUS 1 MG CAPSULE, IMMEDIATE-RELEASE: ORAL | 30 days supply | Qty: 360 | Fill #8

## 2019-08-15 MED FILL — TACROLIMUS 1 MG CAPSULE, IMMEDIATE-RELEASE: 30 days supply | Qty: 360 | Fill #8 | Status: AC

## 2019-08-15 MED FILL — NOVOLOG FLEXPEN U-100 INSULIN ASPART 100 UNIT/ML (3 ML) SUBCUTANEOUS: 55 days supply | Qty: 30 | Fill #1 | Status: AC

## 2019-08-15 MED FILL — ULTICARE PEN NEEDLE 32 GAUGE X 5/32" (4 MM): 25 days supply | Qty: 100 | Fill #4

## 2019-08-15 MED FILL — ULTICARE PEN NEEDLE 32 GAUGE X 5/32" (4 MM): 25 days supply | Qty: 100 | Fill #4 | Status: AC

## 2019-08-15 MED FILL — MYCOPHENOLATE SODIUM 180 MG TABLET,DELAYED RELEASE: 30 days supply | Qty: 180 | Fill #3 | Status: AC

## 2019-08-15 MED FILL — OMEPRAZOLE 20 MG CAPSULE,DELAYED RELEASE: 30 days supply | Qty: 30 | Fill #12 | Status: AC

## 2019-08-15 MED FILL — LANTUS SOLOSTAR U-100 INSULIN 100 UNIT/ML (3 ML) SUBCUTANEOUS PEN: 90 days supply | Qty: 15 | Fill #0 | Status: AC

## 2019-08-16 DIAGNOSIS — I519 Heart disease, unspecified: Secondary | ICD-10-CM | POA: Diagnosis not present

## 2019-08-16 DIAGNOSIS — R079 Chest pain, unspecified: Secondary | ICD-10-CM | POA: Diagnosis not present

## 2019-08-16 DIAGNOSIS — R55 Syncope and collapse: Secondary | ICD-10-CM | POA: Diagnosis not present

## 2019-08-17 DIAGNOSIS — R55 Syncope and collapse: Secondary | ICD-10-CM | POA: Diagnosis not present

## 2019-08-20 DIAGNOSIS — Z940 Kidney transplant status: Principal | ICD-10-CM

## 2019-08-23 ENCOUNTER — Telehealth: Payer: Self-pay | Admitting: *Deleted

## 2019-08-23 NOTE — Telephone Encounter (Signed)
Unable to reach,call can not be completed.

## 2019-08-29 ENCOUNTER — Encounter: Payer: Self-pay | Admitting: Cardiovascular Disease

## 2019-09-04 DIAGNOSIS — S0502XA Injury of conjunctiva and corneal abrasion without foreign body, left eye, initial encounter: Secondary | ICD-10-CM | POA: Diagnosis not present

## 2019-09-09 DIAGNOSIS — S0502XA Injury of conjunctiva and corneal abrasion without foreign body, left eye, initial encounter: Secondary | ICD-10-CM | POA: Insufficient documentation

## 2019-09-10 NOTE — Unmapped (Signed)
[facility] Refill Coordination Note    Specialty Medication(s) to be Shipped:   Transplant: mycophenolate mofetil 180mg  and tacrolimus 1mg     Other medication(s) to be shipped: metoprolol tart 50mg , omeprazole 20mg  and pen needles     Benjamin Maldonado, DOB: 30-Nov-1975  Phone: [phone] (home)       All above HIPAA information was verified with patient.     Was a Nurse, learning disability used for this call? No    Completed refill call assessment today to schedule patient's medication shipment from the [facility] [phone]).       Specialty medication(s) and dose(s) confirmed: Regimen is correct and unchanged.   Changes to medications: [name] reports no changes at this time.  Changes to insurance: No  Questions for the pharmacist: No    Confirmed patient received Welcome Packet with first shipment. The patient will receive a drug information handout for each medication shipped and additional FDA Medication Guides as required.       DISEASE/MEDICATION-SPECIFIC INFORMATION        N/A    SPECIALTY MEDICATION ADHERENCE     Medication Adherence    Patient reported X missed doses in the last month: 1  Specialty Medication: Mycophenolate 180mg   Patient is on additional specialty medications: Yes  Additional Specialty Medications: Tacrolimus 1mg   Patient Reported Additional Medication X Missed Doses in the Last Month: 1  Patient is on more than two specialty medications: No  Adherence tools used: patient uses a pill box to manage medications  Support network for adherence: family member          Mycophenolate 180 mg: 7 days of medicine on hand   Tacrolimus 1 mg: 7 days of medicine on hand     SHIPPING     Shipping address confirmed in Epic.     Delivery Scheduled: Yes, Expected medication delivery date: 09/14/2019.     Medication will be delivered via UPS to the prescription address in Epic WAM.    Lorelei Pont [facility] Specialty Technician

## 2019-09-13 MED ORDER — OMEPRAZOLE 20 MG CAPSULE,DELAYED RELEASE
ORAL_CAPSULE | ORAL | PRN refills | 0 days
Start: 2019-09-13 — End: 2020-09-12

## 2019-09-13 MED FILL — MYCOPHENOLATE SODIUM 180 MG TABLET,DELAYED RELEASE: 30 days supply | Qty: 180 | Fill #4 | Status: AC

## 2019-09-13 MED FILL — METOPROLOL TARTRATE 50 MG TABLET: 30 days supply | Qty: 90 | Fill #1

## 2019-09-13 MED FILL — TACROLIMUS 1 MG CAPSULE, IMMEDIATE-RELEASE: ORAL | 30 days supply | Qty: 360 | Fill #9

## 2019-09-13 MED FILL — ULTICARE PEN NEEDLE 32 GAUGE X 5/32" (4 MM): 25 days supply | Qty: 100 | Fill #5

## 2019-09-13 MED FILL — TACROLIMUS 1 MG CAPSULE, IMMEDIATE-RELEASE: 30 days supply | Qty: 360 | Fill #9 | Status: AC

## 2019-09-13 MED FILL — ULTICARE PEN NEEDLE 32 GAUGE X 5/32" (4 MM): 25 days supply | Qty: 100 | Fill #5 | Status: AC

## 2019-09-13 MED FILL — MYCOPHENOLATE SODIUM 180 MG TABLET,DELAYED RELEASE: ORAL | 30 days supply | Qty: 180 | Fill #4

## 2019-09-13 MED FILL — METOPROLOL TARTRATE 50 MG TABLET: 30 days supply | Qty: 90 | Fill #1 | Status: AC

## 2019-09-17 DIAGNOSIS — Z940 Kidney transplant status: Principal | ICD-10-CM

## 2019-09-18 MED ORDER — OMEPRAZOLE 20 MG CAPSULE,DELAYED RELEASE
ORAL_CAPSULE | ORAL | PRN refills | 0 days | Status: CP
Start: 2019-09-18 — End: 2020-09-17
  Filled 2019-09-20: qty 30, 30d supply, fill #0

## 2019-09-19 NOTE — Unmapped (Signed)
Addended by: Baltazar Najjar on: 09/18/2019 11:16 PM     Modules accepted: Orders

## 2019-09-20 MED FILL — OMEPRAZOLE 20 MG CAPSULE,DELAYED RELEASE: 30 days supply | Qty: 30 | Fill #0 | Status: AC

## 2019-10-04 NOTE — Unmapped (Signed)
[facility] Refill Coordination Note    Specialty Medication(s) to be Shipped:   Transplant: mycophenolate mofetil 180mg  and tacrolimus 1mg     Other medication(s) to be shipped: Novolog,pen needles,Metoprolol     Benjamin Maldonado, DOB: [birth year]  Phone: [phone] (home)       All above HIPAA information was verified with patient.     Was a Nurse, learning disability used for this call? No    Completed refill call assessment today to schedule patient's medication shipment from the [facility] [phone]).       Specialty medication(s) and dose(s) confirmed: Regimen is correct and unchanged.   Changes to medications: Benjamin Maldonado reports no changes at this time.  Changes to insurance: No  Questions for the pharmacist: No    Confirmed patient received Welcome Packet with first shipment. The patient will receive a drug information handout for each medication shipped and additional FDA Medication Guides as required.       DISEASE/MEDICATION-SPECIFIC INFORMATION        N/A    SPECIALTY MEDICATION ADHERENCE     Medication Adherence    Patient reported X missed doses in the last month: 0  Specialty Medication: Mycophenolate 180mg   Patient is on additional specialty medications: Yes  Additional Specialty Medications: Tacrolimus 1mg   Patient Reported Additional Medication X Missed Doses in the Last Month: 0  Patient is on more than two specialty medications: No  Informant: patient  Reliability of informant: reliable  Patient is at risk for Non-Adherence: No  Adherence tools used: patient uses a pill box to manage medications  Support network for adherence: family member                Mycophenolate 180 mg: 11 days of medicine on hand   Tacrolimus 1 mg: 11 days of medicine on hand         SHIPPING     Shipping address confirmed in Epic.     Delivery Scheduled: Yes, Expected medication delivery date: 06/23.     Medication will be delivered via UPS to the prescription address in Epic WAM.    Antonietta Barcelona [facility] Specialty Technician

## 2019-10-05 ENCOUNTER — Encounter (HOSPITAL_COMMUNITY): Payer: Self-pay

## 2019-10-05 ENCOUNTER — Other Ambulatory Visit: Payer: Self-pay

## 2019-10-05 DIAGNOSIS — E1165 Type 2 diabetes mellitus with hyperglycemia: Secondary | ICD-10-CM | POA: Diagnosis not present

## 2019-10-05 DIAGNOSIS — Z5321 Procedure and treatment not carried out due to patient leaving prior to being seen by health care provider: Secondary | ICD-10-CM | POA: Insufficient documentation

## 2019-10-05 DIAGNOSIS — R6883 Chills (without fever): Secondary | ICD-10-CM | POA: Diagnosis not present

## 2019-10-05 DIAGNOSIS — R000 Tachycardia, unspecified: Secondary | ICD-10-CM | POA: Diagnosis not present

## 2019-10-05 DIAGNOSIS — G4489 Other headache syndrome: Secondary | ICD-10-CM | POA: Diagnosis not present

## 2019-10-05 DIAGNOSIS — R519 Headache, unspecified: Secondary | ICD-10-CM | POA: Diagnosis not present

## 2019-10-05 NOTE — ED Notes (Signed)
Pt states that he cannot take ibuprofen d/t a kidney transplant.

## 2019-10-05 NOTE — ED Triage Notes (Signed)
Pt BIB [facility] from home. He reports headache and chills starting about 2 hours ago. Reports that he took tylenol without relief. 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 CBG 357.

## 2019-10-06 ENCOUNTER — Emergency Department (HOSPITAL_COMMUNITY)
Admission: EM | Admit: 2019-10-06 | Discharge: 2019-10-06 | Discharge status: Against Medical Advice | Payer: Medicare HMO | Attending: Emergency Medicine | Admitting: Emergency Medicine

## 2019-10-06 NOTE — ED Notes (Signed)
Pt called a ride to pick him up.

## 2019-10-06 NOTE — ED Notes (Signed)
Pt encouraged to stay several times, but states that he is leaving.

## 2019-10-09 MED FILL — NOVOLOG FLEXPEN U-100 INSULIN ASPART 100 UNIT/ML (3 ML) SUBCUTANEOUS: 55 days supply | Qty: 30 | Fill #2

## 2019-10-09 MED FILL — TACROLIMUS 1 MG CAPSULE, IMMEDIATE-RELEASE: 30 days supply | Qty: 360 | Fill #10 | Status: AC

## 2019-10-09 MED FILL — TACROLIMUS 1 MG CAPSULE, IMMEDIATE-RELEASE: ORAL | 30 days supply | Qty: 360 | Fill #10

## 2019-10-09 MED FILL — MYCOPHENOLATE SODIUM 180 MG TABLET,DELAYED RELEASE: ORAL | 30 days supply | Qty: 180 | Fill #5

## 2019-10-09 MED FILL — ULTICARE PEN NEEDLE 32 GAUGE X 5/32" (4 MM): 25 days supply | Qty: 100 | Fill #6 | Status: AC

## 2019-10-09 MED FILL — NOVOLOG FLEXPEN U-100 INSULIN ASPART 100 UNIT/ML (3 ML) SUBCUTANEOUS: 55 days supply | Qty: 30 | Fill #2 | Status: AC

## 2019-10-09 MED FILL — METOPROLOL TARTRATE 50 MG TABLET: 30 days supply | Qty: 90 | Fill #2

## 2019-10-09 MED FILL — MYCOPHENOLATE SODIUM 180 MG TABLET,DELAYED RELEASE: 30 days supply | Qty: 180 | Fill #5 | Status: AC

## 2019-10-09 MED FILL — METOPROLOL TARTRATE 50 MG TABLET: 30 days supply | Qty: 90 | Fill #2 | Status: AC

## 2019-10-09 MED FILL — ULTICARE PEN NEEDLE 32 GAUGE X 5/32" (4 MM): 25 days supply | Qty: 100 | Fill #6

## 2019-10-15 DIAGNOSIS — Z940 Kidney transplant status: Principal | ICD-10-CM

## 2019-10-18 DIAGNOSIS — E11621 Type 2 diabetes mellitus with foot ulcer: Secondary | ICD-10-CM

## 2019-10-18 HISTORY — DX: Type 2 diabetes mellitus with foot ulcer: E11.621

## 2019-10-26 ENCOUNTER — Telehealth: Payer: Self-pay | Admitting: *Deleted

## 2019-10-26 NOTE — Telephone Encounter (Signed)
"  the caller you have dialed is not in service."

## 2019-10-26 NOTE — Telephone Encounter (Signed)
Dr. March Rummage through [place] requested I contact pt to see how he is doing.

## 2019-10-26 NOTE — Telephone Encounter (Signed)
Thanks

## 2019-10-28 ENCOUNTER — Emergency Department (HOSPITAL_COMMUNITY): Payer: Medicare HMO

## 2019-10-28 ENCOUNTER — Encounter (HOSPITAL_COMMUNITY): Payer: Self-pay

## 2019-10-28 ENCOUNTER — Inpatient Hospital Stay (HOSPITAL_COMMUNITY)
Admission: EM | Admit: 2019-10-28 | Discharge: 2019-11-05 | DRG: 853 | Disposition: A | Discharge status: Home Health | Payer: Medicare HMO | Attending: Internal Medicine | Admitting: Internal Medicine

## 2019-10-28 DIAGNOSIS — E1122 Type 2 diabetes mellitus with diabetic chronic kidney disease: Secondary | ICD-10-CM | POA: Diagnosis not present

## 2019-10-28 DIAGNOSIS — Y92009 Unspecified place in unspecified non-institutional (private) residence as the place of occurrence of the external cause: Secondary | ICD-10-CM

## 2019-10-28 DIAGNOSIS — E1151 Type 2 diabetes mellitus with diabetic peripheral angiopathy without gangrene: Secondary | ICD-10-CM | POA: Diagnosis present

## 2019-10-28 DIAGNOSIS — N179 Acute kidney failure, unspecified: Secondary | ICD-10-CM | POA: Diagnosis present

## 2019-10-28 DIAGNOSIS — H548 Legal blindness, as defined in USA: Secondary | ICD-10-CM | POA: Diagnosis present

## 2019-10-28 DIAGNOSIS — E11621 Type 2 diabetes mellitus with foot ulcer: Secondary | ICD-10-CM | POA: Diagnosis not present

## 2019-10-28 DIAGNOSIS — I70243 Atherosclerosis of native arteries of left leg with ulceration of ankle: Secondary | ICD-10-CM | POA: Diagnosis not present

## 2019-10-28 DIAGNOSIS — I2510 Atherosclerotic heart disease of native coronary artery without angina pectoris: Secondary | ICD-10-CM | POA: Diagnosis present

## 2019-10-28 DIAGNOSIS — E875 Hyperkalemia: Secondary | ICD-10-CM

## 2019-10-28 DIAGNOSIS — E1142 Type 2 diabetes mellitus with diabetic polyneuropathy: Secondary | ICD-10-CM | POA: Diagnosis present

## 2019-10-28 DIAGNOSIS — R410 Disorientation, unspecified: Secondary | ICD-10-CM | POA: Diagnosis not present

## 2019-10-28 DIAGNOSIS — Y9301 Activity, walking, marching and hiking: Secondary | ICD-10-CM | POA: Diagnosis present

## 2019-10-28 DIAGNOSIS — I129 Hypertensive chronic kidney disease with stage 1 through stage 4 chronic kidney disease, or unspecified chronic kidney disease: Secondary | ICD-10-CM | POA: Diagnosis present

## 2019-10-28 DIAGNOSIS — R531 Weakness: Secondary | ICD-10-CM | POA: Diagnosis not present

## 2019-10-28 DIAGNOSIS — A419 Sepsis, unspecified organism: Secondary | ICD-10-CM | POA: Diagnosis not present

## 2019-10-28 DIAGNOSIS — N1832 Chronic kidney disease, stage 3b: Secondary | ICD-10-CM | POA: Diagnosis present

## 2019-10-28 DIAGNOSIS — M79671 Pain in right foot: Secondary | ICD-10-CM | POA: Diagnosis not present

## 2019-10-28 DIAGNOSIS — Z833 Family history of diabetes mellitus: Secondary | ICD-10-CM

## 2019-10-28 DIAGNOSIS — R627 Adult failure to thrive: Secondary | ICD-10-CM | POA: Diagnosis present

## 2019-10-28 DIAGNOSIS — E1010 Type 1 diabetes mellitus with ketoacidosis without coma: Secondary | ICD-10-CM | POA: Diagnosis not present

## 2019-10-28 DIAGNOSIS — W19XXXA Unspecified fall, initial encounter: Secondary | ICD-10-CM | POA: Diagnosis present

## 2019-10-28 DIAGNOSIS — A401 Sepsis due to streptococcus, group B: Secondary | ICD-10-CM | POA: Diagnosis not present

## 2019-10-28 DIAGNOSIS — E119 Type 2 diabetes mellitus without complications: Secondary | ICD-10-CM | POA: Diagnosis not present

## 2019-10-28 DIAGNOSIS — N184 Chronic kidney disease, stage 4 (severe): Secondary | ICD-10-CM

## 2019-10-28 DIAGNOSIS — R52 Pain, unspecified: Secondary | ICD-10-CM | POA: Diagnosis not present

## 2019-10-28 DIAGNOSIS — M79672 Pain in left foot: Secondary | ICD-10-CM | POA: Diagnosis not present

## 2019-10-28 DIAGNOSIS — T797XXA Traumatic subcutaneous emphysema, initial encounter: Secondary | ICD-10-CM | POA: Diagnosis not present

## 2019-10-28 DIAGNOSIS — Z940 Kidney transplant status: Secondary | ICD-10-CM

## 2019-10-28 DIAGNOSIS — S40219A Abrasion of unspecified shoulder, initial encounter: Secondary | ICD-10-CM | POA: Diagnosis present

## 2019-10-28 DIAGNOSIS — E785 Hyperlipidemia, unspecified: Secondary | ICD-10-CM | POA: Diagnosis present

## 2019-10-28 DIAGNOSIS — Z9861 Coronary angioplasty status: Secondary | ICD-10-CM

## 2019-10-28 DIAGNOSIS — Z9114 Patient's other noncompliance with medication regimen: Secondary | ICD-10-CM

## 2019-10-28 DIAGNOSIS — L0390 Cellulitis, unspecified: Secondary | ICD-10-CM | POA: Diagnosis not present

## 2019-10-28 DIAGNOSIS — I10 Essential (primary) hypertension: Secondary | ICD-10-CM

## 2019-10-28 DIAGNOSIS — Z79899 Other long term (current) drug therapy: Secondary | ICD-10-CM

## 2019-10-28 DIAGNOSIS — E11319 Type 2 diabetes mellitus with unspecified diabetic retinopathy without macular edema: Secondary | ICD-10-CM | POA: Diagnosis present

## 2019-10-28 DIAGNOSIS — E1110 Type 2 diabetes mellitus with ketoacidosis without coma: Secondary | ICD-10-CM | POA: Diagnosis not present

## 2019-10-28 DIAGNOSIS — R55 Syncope and collapse: Secondary | ICD-10-CM | POA: Diagnosis not present

## 2019-10-28 DIAGNOSIS — M86171 Other acute osteomyelitis, right ankle and foot: Secondary | ICD-10-CM | POA: Diagnosis not present

## 2019-10-28 DIAGNOSIS — L97519 Non-pressure chronic ulcer of other part of right foot with unspecified severity: Secondary | ICD-10-CM | POA: Diagnosis not present

## 2019-10-28 DIAGNOSIS — E871 Hypo-osmolality and hyponatremia: Secondary | ICD-10-CM | POA: Diagnosis present

## 2019-10-28 DIAGNOSIS — S01511A Laceration without foreign body of lip, initial encounter: Secondary | ICD-10-CM | POA: Diagnosis present

## 2019-10-28 DIAGNOSIS — L97529 Non-pressure chronic ulcer of other part of left foot with unspecified severity: Secondary | ICD-10-CM | POA: Diagnosis not present

## 2019-10-28 DIAGNOSIS — Z7982 Long term (current) use of aspirin: Secondary | ICD-10-CM

## 2019-10-28 DIAGNOSIS — T8182XA Emphysema (subcutaneous) resulting from a procedure, initial encounter: Secondary | ICD-10-CM | POA: Diagnosis not present

## 2019-10-28 DIAGNOSIS — L98499 Non-pressure chronic ulcer of skin of other sites with unspecified severity: Secondary | ICD-10-CM | POA: Diagnosis not present

## 2019-10-28 DIAGNOSIS — T8612 Kidney transplant failure: Secondary | ICD-10-CM | POA: Diagnosis not present

## 2019-10-28 DIAGNOSIS — E1169 Type 2 diabetes mellitus with other specified complication: Secondary | ICD-10-CM | POA: Diagnosis present

## 2019-10-28 DIAGNOSIS — R0902 Hypoxemia: Secondary | ICD-10-CM | POA: Diagnosis not present

## 2019-10-28 DIAGNOSIS — Z20822 Contact with and (suspected) exposure to covid-19: Secondary | ICD-10-CM | POA: Diagnosis present

## 2019-10-28 DIAGNOSIS — E11622 Type 2 diabetes mellitus with other skin ulcer: Secondary | ICD-10-CM | POA: Diagnosis present

## 2019-10-28 DIAGNOSIS — L97509 Non-pressure chronic ulcer of other part of unspecified foot with unspecified severity: Secondary | ICD-10-CM

## 2019-10-28 DIAGNOSIS — E1310 Other specified diabetes mellitus with ketoacidosis without coma: Secondary | ICD-10-CM | POA: Diagnosis not present

## 2019-10-28 DIAGNOSIS — L97409 Non-pressure chronic ulcer of unspecified heel and midfoot with unspecified severity: Secondary | ICD-10-CM

## 2019-10-28 DIAGNOSIS — I70233 Atherosclerosis of native arteries of right leg with ulceration of ankle: Secondary | ICD-10-CM | POA: Diagnosis not present

## 2019-10-28 DIAGNOSIS — R600 Localized edema: Secondary | ICD-10-CM | POA: Diagnosis not present

## 2019-10-28 DIAGNOSIS — Z794 Long term (current) use of insulin: Secondary | ICD-10-CM

## 2019-10-28 DIAGNOSIS — E1165 Type 2 diabetes mellitus with hyperglycemia: Secondary | ICD-10-CM | POA: Diagnosis not present

## 2019-10-28 DIAGNOSIS — M86172 Other acute osteomyelitis, left ankle and foot: Secondary | ICD-10-CM | POA: Diagnosis not present

## 2019-10-28 DIAGNOSIS — G9341 Metabolic encephalopathy: Secondary | ICD-10-CM | POA: Diagnosis not present

## 2019-10-28 DIAGNOSIS — S0081XA Abrasion of other part of head, initial encounter: Secondary | ICD-10-CM | POA: Diagnosis present

## 2019-10-28 DIAGNOSIS — Z811 Family history of alcohol abuse and dependence: Secondary | ICD-10-CM

## 2019-10-28 DIAGNOSIS — B964 Proteus (mirabilis) (morganii) as the cause of diseases classified elsewhere: Secondary | ICD-10-CM | POA: Diagnosis present

## 2019-10-28 LAB — CBG MONITORING, ED
Glucose-Capillary: >600 mg/dL (ref 70–99)
Glucose-Capillary: >600 mg/dL (ref 70–99)
Glucose-Capillary: >600 mg/dL (ref 70–99)
Glucose-Capillary: >600 mg/dL (ref 70–99)
Glucose-Capillary: 552 mg/dL (ref 70–99)
Glucose-Capillary: >600 mg/dL (ref 70–99)
Glucose-Capillary: 464 mg/dL — ABNORMAL HIGH (ref 70–99)
Glucose-Capillary: 476 mg/dL — ABNORMAL HIGH (ref 70–99)
Glucose-Capillary: 384 mg/dL — ABNORMAL HIGH (ref 70–99)
Glucose-Capillary: 383 mg/dL — ABNORMAL HIGH (ref 70–99)
Glucose-Capillary: 235 mg/dL — ABNORMAL HIGH (ref 70–99)
Glucose-Capillary: 177 mg/dL — ABNORMAL HIGH (ref 70–99)

## 2019-10-28 LAB — I-STAT ARTERIAL BLOOD GAS, ED
Acid-base deficit: 12 mmol/L — ABNORMAL HIGH (ref 0.0–2.0)
Bicarbonate: 12 mmol/L — ABNORMAL LOW (ref 20.0–28.0)
Calcium, Ion: 1.25 mmol/L (ref 1.15–1.40)
HCT: 35 % — ABNORMAL LOW (ref 39.0–52.0)
Hemoglobin: 11.9 g/dL — ABNORMAL LOW (ref 13.0–17.0)
O2 Saturation: 98 %
Patient temperature: 98.1
Potassium: 5.4 mmol/L — ABNORMAL HIGH (ref 3.5–5.1)
Sodium: 130 mmol/L — ABNORMAL LOW (ref 135–145)
TCO2: 13 mmol/L — ABNORMAL LOW (ref 22–32)
pCO2 arterial: 22.5 mmHg — ABNORMAL LOW (ref 32.0–48.0)
pH, Arterial: 7.332 — ABNORMAL LOW (ref 7.350–7.450)
pO2, Arterial: 112 mmHg — ABNORMAL HIGH (ref 83.0–108.0)

## 2019-10-28 LAB — BASIC METABOLIC PANEL
Anion gap: 25 — ABNORMAL HIGH (ref 5–15)
Anion gap: 13 (ref 5–15)
BUN: 47 mg/dL — ABNORMAL HIGH (ref 6–20)
BUN: 40 mg/dL — ABNORMAL HIGH (ref 6–20)
CO2: 14 mmol/L — ABNORMAL LOW (ref 22–32)
CO2: 22 mmol/L (ref 22–32)
Calcium: 10.3 mg/dL (ref 8.9–10.3)
Calcium: 10.4 mg/dL — ABNORMAL HIGH (ref 8.9–10.3)
Chloride: 91 mmol/L — ABNORMAL LOW (ref 98–111)
Chloride: 110 mmol/L (ref 98–111)
Creatinine, Ser: 2.83 mg/dL — ABNORMAL HIGH (ref 0.61–1.24)
Creatinine, Ser: 2.02 mg/dL — ABNORMAL HIGH (ref 0.61–1.24)
GFR calc Af Amer: 30 mL/min — ABNORMAL LOW (ref >60)
GFR calc Af Amer: 45 mL/min — ABNORMAL LOW (ref >60)
GFR calc non Af Amer: 26 mL/min — ABNORMAL LOW (ref >60)
GFR calc non Af Amer: 39 mL/min — ABNORMAL LOW (ref >60)
Glucose, Bld: 1189 mg/dL (ref 70–99)
Glucose, Bld: 280 mg/dL — ABNORMAL HIGH (ref 70–99)
Potassium: 6.4 mmol/L (ref 3.5–5.1)
Potassium: 4.2 mmol/L (ref 3.5–5.1)
Sodium: 130 mmol/L — ABNORMAL LOW (ref 135–145)
Sodium: 145 mmol/L (ref 135–145)

## 2019-10-28 LAB — URINALYSIS, ROUTINE W REFLEX MICROSCOPIC
Bacteria, UA: NONE SEEN
Bilirubin Urine: NEGATIVE
Glucose, UA: >=500 mg/dL — AB
Ketones, ur: 20 mg/dL — AB
Leukocytes,Ua: NEGATIVE
Nitrite: NEGATIVE
Protein, ur: NEGATIVE mg/dL
Specific Gravity, Urine: 1.026 (ref 1.005–1.030)
pH: 5 (ref 5.0–8.0)

## 2019-10-28 LAB — BLOOD CULTURE ID PANEL (REFLEXED)

## 2019-10-28 LAB — GLUCOSE, CAPILLARY
Glucose-Capillary: 172 mg/dL — ABNORMAL HIGH (ref 70–99)
Glucose-Capillary: 176 mg/dL — ABNORMAL HIGH (ref 70–99)
Glucose-Capillary: 199 mg/dL — ABNORMAL HIGH (ref 70–99)
Glucose-Capillary: 234 mg/dL — ABNORMAL HIGH (ref 70–99)

## 2019-10-28 LAB — LACTIC ACID, PLASMA
Lactic Acid, Venous: 3.2 mmol/L (ref 0.5–1.9)
Lactic Acid, Venous: 2.4 mmol/L (ref 0.5–1.9)

## 2019-10-28 LAB — HIV ANTIBODY (ROUTINE TESTING W REFLEX): HIV Screen 4th Generation wRfx: NONREACTIVE

## 2019-10-28 LAB — CBC
HCT: 43.5 % (ref 39.0–52.0)
Hemoglobin: 11.9 g/dL — ABNORMAL LOW (ref 13.0–17.0)
MCH: 25.2 pg — ABNORMAL LOW (ref 26.0–34.0)
MCHC: 27.4 g/dL — ABNORMAL LOW (ref 30.0–36.0)
MCV: 92.2 fL (ref 80.0–100.0)
Platelets: 316 10*3/uL (ref 150–400)
RBC: 4.72 MIL/uL (ref 4.22–5.81)
RDW: 16.3 % — ABNORMAL HIGH (ref 11.5–15.5)
WBC: 20.3 10*3/uL — ABNORMAL HIGH (ref 4.0–10.5)
nRBC: 0 % (ref 0.0–0.2)

## 2019-10-28 LAB — BETA-HYDROXYBUTYRIC ACID: Beta-Hydroxybutyric Acid: >8 mmol/L — ABNORMAL HIGH (ref 0.05–0.27)

## 2019-10-28 LAB — HEPATIC FUNCTION PANEL
ALT: 16 U/L (ref 0–44)
AST: 14 U/L — ABNORMAL LOW (ref 15–41)
Albumin: 2.8 g/dL — ABNORMAL LOW (ref 3.5–5.0)
Alkaline Phosphatase: 93 U/L (ref 38–126)
Bilirubin, Direct: 0.2 mg/dL (ref 0.0–0.2)
Indirect Bilirubin: 1.4 mg/dL — ABNORMAL HIGH (ref 0.3–0.9)
Total Bilirubin: 1.6 mg/dL — ABNORMAL HIGH (ref 0.3–1.2)
Total Protein: 8.1 g/dL (ref 6.5–8.1)

## 2019-10-28 LAB — PROTIME-INR
INR: 1.3 — ABNORMAL HIGH (ref 0.8–1.2)
Prothrombin Time: 15.8 seconds — ABNORMAL HIGH (ref 11.4–15.2)

## 2019-10-28 LAB — APTT: aPTT: 27 seconds (ref 24–36)

## 2019-10-28 LAB — TROPONIN I (HIGH SENSITIVITY)
Troponin I (High Sensitivity): 15 ng/L (ref <18)
Troponin I (High Sensitivity): 20 ng/L — ABNORMAL HIGH (ref <18)

## 2019-10-28 LAB — SARS CORONAVIRUS 2 BY RT PCR (HOSPITAL ORDER, PERFORMED IN ~~LOC~~ HOSPITAL LAB): SARS Coronavirus 2: NEGATIVE

## 2019-10-28 MED ORDER — DEXTROSE-NACL 5-0.45 % IV SOLN: INTRAVENOUS | Status: DC

## 2019-10-28 MED ORDER — VANCOMYCIN HCL IN DEXTROSE 1-5 GM/200ML-% IV SOLN: 1000.0000 mg | INTRAVENOUS | Status: DC

## 2019-10-28 MED ORDER — PIPERACILLIN-TAZOBACTAM 3.375 G IVPB
3.3750 g | Freq: Once | INTRAVENOUS | Status: AC
  Administered 2019-10-28: 3.375 g via INTRAVENOUS
  Filled 2019-10-28: qty 50

## 2019-10-28 MED ORDER — SODIUM CHLORIDE 0.9 % IV SOLN: INTRAVENOUS | Status: DC

## 2019-10-28 MED ORDER — SODIUM CHLORIDE 0.9 % IV SOLN
2.0000 g | INTRAVENOUS | Status: DC
  Administered 2019-10-28 – 2019-10-29 (×2): 2 g via INTRAVENOUS
  Filled 2019-10-28 (×2): qty 2

## 2019-10-28 MED ORDER — METRONIDAZOLE IN NACL 5-0.79 MG/ML-% IV SOLN
500.0000 mg | Freq: Three times a day (TID) | INTRAVENOUS | Status: DC
  Administered 2019-10-28: 500 mg via INTRAVENOUS
  Filled 2019-10-28: qty 100

## 2019-10-28 MED ORDER — SODIUM CHLORIDE 0.9 % IV BOLUS
1000.0000 mL | Freq: Once | INTRAVENOUS | Status: AC
  Administered 2019-10-28: 1000 mL via INTRAVENOUS

## 2019-10-28 MED ORDER — INSULIN ASPART 100 UNIT/ML ~~LOC~~ SOLN
0.0000 [IU] | Freq: Three times a day (TID) | SUBCUTANEOUS | Status: DC
  Administered 2019-10-28: 3 [IU] via SUBCUTANEOUS
  Administered 2019-10-29 (×2): 5 [IU] via SUBCUTANEOUS
  Administered 2019-10-29: 15 [IU] via SUBCUTANEOUS
  Administered 2019-10-30 – 2019-10-31 (×4): 3 [IU] via SUBCUTANEOUS
  Administered 2019-10-31: 5 [IU] via SUBCUTANEOUS
  Administered 2019-11-01: 3 [IU] via SUBCUTANEOUS
  Administered 2019-11-01 – 2019-11-02 (×2): 5 [IU] via SUBCUTANEOUS
  Administered 2019-11-02: 2 [IU] via SUBCUTANEOUS
  Administered 2019-11-03 – 2019-11-04 (×3): 3 [IU] via SUBCUTANEOUS
  Administered 2019-11-04: 2 [IU] via SUBCUTANEOUS
  Administered 2019-11-05: 5 [IU] via SUBCUTANEOUS
  Administered 2019-11-05: 3 [IU] via SUBCUTANEOUS

## 2019-10-28 MED ORDER — DEXTROSE 50 % IV SOLN: 0.0000 mL | INTRAVENOUS | Status: DC | PRN

## 2019-10-28 MED ORDER — HEPARIN SODIUM (PORCINE) 5000 UNIT/ML IJ SOLN
5000.0000 [IU] | Freq: Three times a day (TID) | INTRAMUSCULAR | Status: DC
  Administered 2019-10-28 – 2019-11-05 (×25): 5000 [IU] via SUBCUTANEOUS
  Filled 2019-10-28 (×25): qty 1

## 2019-10-28 MED ORDER — INSULIN ASPART 100 UNIT/ML ~~LOC~~ SOLN
0.0000 [IU] | Freq: Every day | SUBCUTANEOUS | Status: DC
  Administered 2019-10-28: 2 [IU] via SUBCUTANEOUS

## 2019-10-28 MED ORDER — ATORVASTATIN CALCIUM 40 MG PO TABS
40.0000 mg | ORAL_TABLET | Freq: Every day | ORAL | Status: DC
  Administered 2019-10-28 – 2019-11-05 (×9): 40 mg via ORAL
  Filled 2019-10-28 (×9): qty 1

## 2019-10-28 MED ORDER — INSULIN GLARGINE 100 UNIT/ML ~~LOC~~ SOLN
20.0000 [IU] | Freq: Every day | SUBCUTANEOUS | Status: DC
  Administered 2019-10-28: 20 [IU] via SUBCUTANEOUS
  Filled 2019-10-28 (×4): qty 0.2

## 2019-10-28 MED ORDER — VANCOMYCIN HCL IN DEXTROSE 1-5 GM/200ML-% IV SOLN
1000.0000 mg | Freq: Once | INTRAVENOUS | Status: AC
  Administered 2019-10-28: 1000 mg via INTRAVENOUS
  Filled 2019-10-28: qty 200

## 2019-10-28 MED ORDER — CALCIUM GLUCONATE-NACL 1-0.675 GM/50ML-% IV SOLN
1.0000 g | Freq: Once | INTRAVENOUS | Status: AC
  Administered 2019-10-28: 1000 mg via INTRAVENOUS
  Filled 2019-10-28: qty 50

## 2019-10-28 MED ORDER — ONDANSETRON HCL 4 MG/2ML IJ SOLN
4.0000 mg | Freq: Four times a day (QID) | INTRAMUSCULAR | Status: DC | PRN
  Administered 2019-11-01: 4 mg via INTRAVENOUS
  Filled 2019-10-28 (×2): qty 2

## 2019-10-28 MED ORDER — ASPIRIN EC 81 MG PO TBEC
81.0000 mg | DELAYED_RELEASE_TABLET | Freq: Every day | ORAL | Status: DC
  Administered 2019-10-28 – 2019-11-05 (×9): 81 mg via ORAL
  Filled 2019-10-28 (×9): qty 1

## 2019-10-28 MED ORDER — DORZOLAMIDE HCL-TIMOLOL MAL 2-0.5 % OP SOLN
1.0000 [drp] | Freq: Two times a day (BID) | OPHTHALMIC | Status: DC
  Administered 2019-10-28 – 2019-11-05 (×15): 1 [drp] via OPHTHALMIC
  Filled 2019-10-28: qty 10

## 2019-10-28 MED ORDER — INSULIN REGULAR(HUMAN) IN NACL 100-0.9 UT/100ML-% IV SOLN: INTRAVENOUS | Status: DC

## 2019-10-28 MED ORDER — VANCOMYCIN HCL IN DEXTROSE 1-5 GM/200ML-% IV SOLN
1000.0000 mg | Freq: Once | INTRAVENOUS | Status: DC
  Administered 2019-10-28: 1000 mg via INTRAVENOUS
  Filled 2019-10-28: qty 200

## 2019-10-28 MED ORDER — INSULIN REGULAR(HUMAN) IN NACL 100-0.9 UT/100ML-% IV SOLN
INTRAVENOUS | Status: DC
  Administered 2019-10-28: 8 [IU]/h via INTRAVENOUS
  Filled 2019-10-28: qty 100

## 2019-10-28 NOTE — Progress Notes (Signed)
Pharmacy Antibiotic Note  Anthony Blanchard is a 44 y.o. male admitted on 10/28/2019 with sepsis.  Pharmacy has been consulted for vancomycin and cefepime dosing. Vancomycin 1gm given in ED  Plan: Give additional 1gm vancomycin for total 2gm load then 1gm IV q24 hours Cefepime 2gm IV q24 hours F/u renal function, cultures and clinical course   Height: 5\' 9"  (175.3 cm) Weight: 99.8 kg (220 lb) IBW/kg (Calculated) : 70.7  Temp (24hrs), Avg:98.1 F (36.7 C), Min:98.1 F (36.7 C), Max:98.1 F (36.7 C)  Recent Labs  Lab 10/28/19 0237 10/28/19 0453  WBC 20.3*  --   CREATININE 2.83*  --   LATICACIDVEN 3.2* 2.4*    Estimated Creatinine Clearance: 39.2 mL/min (A) (by C-G formula based on SCr of 2.83 mg/dL (H)).    Allergies  Allergen Reactions  . Shellfish Allergy Anaphylaxis  . Shellfish-Derived Products Anaphylaxis  . Iodinated Diagnostic Agents Other (See Comments)  . Iodine Other (See Comments)   Thank you for allowing pharmacy to be a part of this patient's care.  Excell Seltzer Poteet 10/28/2019 6:21 AM

## 2019-10-28 NOTE — ED Notes (Signed)
Lab advises that they have none of the labs drawn at 0844 this morning. Checked with phlebotomist who advises that she did indeed send the labs.

## 2019-10-28 NOTE — Progress Notes (Signed)
PHARMACY - PHYSICIAN COMMUNICATION CRITICAL VALUE ALERT - BLOOD CULTURE IDENTIFICATION (BCID)  Anthony Blanchard is an 44 y.o. male who presented to [facility] on 10/28/2019 with a chief complaint of sepsis  Assessment:  Suspected diabetic foot infection, 1/4 BC with GBS  Name of physician (or Provider) Contacted: T Opyd  Current antibiotics: vanc and cefepime  Changes to prescribed antibiotics recommended:  Continue same for now  Results for orders placed or performed during the hospital encounter of 10/28/19  Blood Culture ID Panel (Reflexed) (Collected: 10/28/2019  5:02 AM)  Result Value Ref Range   Enterococcus species NOT DETECTED NOT DETECTED   Listeria monocytogenes NOT DETECTED NOT DETECTED   Staphylococcus species NOT DETECTED NOT DETECTED   Staphylococcus aureus (BCID) NOT DETECTED NOT DETECTED   Streptococcus species DETECTED (A) NOT DETECTED   Streptococcus agalactiae DETECTED (A) NOT DETECTED   Streptococcus pneumoniae NOT DETECTED NOT DETECTED   Streptococcus pyogenes NOT DETECTED NOT DETECTED   Acinetobacter baumannii NOT DETECTED NOT DETECTED   Enterobacteriaceae species NOT DETECTED NOT DETECTED   Enterobacter cloacae complex NOT DETECTED NOT DETECTED   Escherichia coli NOT DETECTED NOT DETECTED   Klebsiella oxytoca NOT DETECTED NOT DETECTED   Klebsiella pneumoniae NOT DETECTED NOT DETECTED   Proteus species NOT DETECTED NOT DETECTED   Serratia marcescens NOT DETECTED NOT DETECTED   Haemophilus influenzae NOT DETECTED NOT DETECTED   Neisseria meningitidis NOT DETECTED NOT DETECTED   Pseudomonas aeruginosa NOT DETECTED NOT DETECTED   Candida albicans NOT DETECTED NOT DETECTED   Candida glabrata NOT DETECTED NOT DETECTED   Candida krusei NOT DETECTED NOT DETECTED   Candida parapsilosis NOT DETECTED NOT DETECTED   Candida tropicalis NOT DETECTED NOT DETECTED    Excell Seltzer Poteet 10/28/2019  11:00 PM

## 2019-10-28 NOTE — Progress Notes (Signed)
Subjective:  Patient ID: Anthony Blanchard, male    DOB: 03/18/1976,  MRN: [id number]  Chief Complaint  Patient presents with  . Wound Check    Right foot lateral aspect wound check. Pt states occasional soreness but feels as though he is healing and improving. Denies fever/chills/nausea/vomiting and states there hasn't been any drainage. Pt had no concerns.    44 y.o. male presents for wound care. Hx as above.  Review of Systems: Negative except as noted in the HPI. Denies N/V/F/Ch.  Past Medical History:  Diagnosis Date  . Abnormal results of thyroid function studies 08/16/2011  . Chronic kidney disease   . Coronary artery disease   . Diabetes mellitus without complication ([place])   . GERD (gastroesophageal reflux disease)   . Glaucoma   . Hypertension   . Overweight   . Partial blindness    No current facility-administered medications for this visit. No current outpatient medications on file.  Facility-Administered Medications Ordered in Other Visits:  .  0.9 %  sodium chloride infusion, , Intravenous, Continuous, Domenic Polite, MD, Last Rate: 75 mL/hr at 10/28/19 1823, New Bag at 10/28/19 1823 .  aspirin EC tablet 81 mg, 81 mg, Oral, Daily, Chotiner, Yevonne Aline, MD, 81 mg at 10/28/19 1041 .  atorvastatin (LIPITOR) tablet 40 mg, 40 mg, Oral, Daily, Chotiner, Yevonne Aline, MD, 40 mg at 10/28/19 1041 .  ceFEPIme (MAXIPIME) 2 g in sodium chloride 0.9 % 100 mL IVPB, 2 g, Intravenous, Q24H, Chotiner, Yevonne Aline, MD, Stopped at 10/28/19 0729 .  dextrose 5 %-0.45 % sodium chloride infusion, , Intravenous, Continuous, Domenic Polite, MD, Stopped at 10/28/19 1822 .  dextrose 50 % solution 0-50 mL, 0-50 mL, Intravenous, PRN, Delo, Douglas, MD .  dorzolamide-timolol (COSOPT) 22.3-6.8 MG/ML ophthalmic solution 1 drop, 1 drop, Left Eye, BID, Chotiner, Yevonne Aline, MD .  heparin injection 5,000 Units, 5,000 Units, Subcutaneous, Q8H, Chotiner, Yevonne Aline, MD, 5,000 Units at 10/28/19 1408 .  insulin  aspart (novoLOG) injection 0-15 Units, 0-15 Units, Subcutaneous, TID WC, Domenic Polite, MD, 3 Units at 10/28/19 1821 .  insulin aspart (novoLOG) injection 0-5 Units, 0-5 Units, Subcutaneous, QHS, Domenic Polite, MD .  insulin glargine (LANTUS) injection 20 Units, 20 Units, Subcutaneous, Daily, Domenic Polite, MD, 20 Units at 10/28/19 1658 .  insulin regular, human (MYXREDLIN) 100 units/ 100 mL infusion, , Intravenous, Continuous, Chotiner, Yevonne Aline, MD .  ondansetron [facility]) injection 4 mg, 4 mg, Intravenous, Q6H PRN, Domenic Polite, MD  Social History   Tobacco Use  Smoking Status Never Smoker  Smokeless Tobacco Never Used    Allergies  Allergen Reactions  . Shellfish Allergy Anaphylaxis  . Shellfish-Derived Products Anaphylaxis  . Sulfa Antibiotics Anaphylaxis    Other reaction(s): Other (See Comments)  . Iodinated Diagnostic Agents Other (See Comments)    Other reaction(s): Other (see comments)  . Iodine Other (See Comments)    Other reaction(s): Other (see comments)   Objective:   Vitals:   01/30/19 0850  Temp: 98.1 F (36.7 C)   There is no height or weight on file to calculate BMI. Constitutional Well developed. Well nourished.  Vascular Dorsalis pedis pulses palpable bilaterally. Posterior tibial pulses palpable bilaterally. Capillary refill normal to all digits.  No cyanosis or clubbing noted. Pedal hair growth normal.  Neurologic Normal speech. Oriented to person, place, and time. Protective sensation absent  Dermatologic Wound Location: R 5th MPJ Wound Base: Granular/Healthy Peri-wound: Calloused Exudate: None: wound tissue dry Wound Measurements: 1x1  Orthopedic: No  pain to palpation either foot.   Radiographs: None Assessment:   1. Diabetic ulcer of right foot associated with diabetes mellitus due to underlying condition, limited to breakdown of skin, unspecified part of foot ([place])    Plan:  Patient was evaluated and treated and all questions  answered.  Ulcer right foot -Debridement as below. -Dressed with silvadene, DSD. -Continue off-loading with pads  Procedure: Selective Debridement of Wound Rationale: Removal of devitalized tissue from the wound to promote healing.  Pre-Debridement Wound Measurements: 1 cm x 1 cm x 0.2 cm  Post-Debridement Wound Measurements: same as pre-debridement. Type of Debridement: sharp selective Tissue Removed: Devitalized soft-tissue Dressing: Dry, sterile, compression dressing. Disposition: Patient tolerated procedure well. Patient to return in 1 week for follow-up.    No follow-ups on file.

## 2019-10-28 NOTE — H&P (Signed)
History and Physical    Anthony Blanchard AVW:[id number] DOB: 1975/08/19 DOA: 10/28/2019  PCP: Pleas Koch, NP   Patient coming from: Home  Chief Complaint:  Weakness, disorientation and fall at home  HPI: Anthony Blanchard is a 44 y.o. 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  He reports he had a fall at home but did not have loss of consciousness.  States his thinking has been "cloudy" for the last few days.  He denies having any nausea or vomiting.  He denies any abdominal pain or diarrhea.  He states he has not had any chest pain, fever, cough, shortness of breath, palpitations, dysuria.  He states that he does have frequent urination and that he is thirsty all the time.  He reports his blood sugars have been running high recently but he admits he has not been checking his blood sugars regularly.  He states that he has insulin at home but that he does not take it regularly and does not remember the last time he actually used it.  Reports he has not been seen by his PCP in the last few months.  He is a poor historian and is scattered in his thinking.  But he will have to start letting her.  He reports he was walking in his house when he fell and does not remember what caused him to fall.  He reports he has had the weakness and been feeling disoriented for the last few days.  He did not take any medications or tried anything to help with the symptoms at home.  When directly asked about walking barefoot he states that he does walk barefoot and he does have some cuts and ulcers on his feet but he is not sure how long they have been there.  Dates he does have drainage from an area on the bottom of his right foot.  Patient reports he has not been taking his medications at home  except for his medications to prevent him of his kidney transplant.  Although he has multiple bottles of the same medications filled on different months and all the bottles are at least half way to completely full so it is not clear how much he is actually taking them. He denies tobacco, alcohol, illicit drug use.  ED Course: In the emergency room patient is found to have a blood sugar greater than 1150 with high glucose in his urine and ketones in his urine.  pH is 7.32 on ABG.  He has an elevated Mackiewicz count greater than 20,000.  Lactic acid level of 3.2.  He has tachycardia with heart rate in the 100-110 range with his disorientation he meets SIRS criteria for sepsis.  2.83 with BUN of 47 and GFR of 26.   Review of Systems:  General: Reports generalized weakness.  Eyes fever, chills, weight loss, night sweats.  Denies dizziness.  Denies change in appetite but states he is thirsty all the time. HENT: Denies head trauma, headache, denies change in hearing, tinnitus.  Denies nasal congestion or bleeding.  Denies sore throat, sores in mouth.  Denies difficulty swallowing Eyes: Denies blurry vision, pain in eye, drainage.  Denies discoloration of eyes Neck: Denies pain.  Denies swelling.  Denies pain with movement. Cardiovascular: Denies chest pain, palpitations.  Reports mild chronic edema.  Denies orthopnea  Respiratory: Denies shortness of breath, cough.  Denies wheezing.  Denies sputum production Gastrointestinal: Denies abdominal pain, swelling.  Denies nausea, vomiting, diarrhea.  Denies melena.  Denies hematemesis. Musculoskeletal: Denies limitation of movement.  Denies deformity or swelling.  Denies pain.  Denies arthralgias or myalgias.  Genitourinary: Reports urinary frequency.  Denies pelvic pain.  Denies urinary hesitancy.  Denies dysuria.  Skin: Denies rash.  Denies petechiae, purpura, ecchymosis.  Reports ulcer on the bottom of his right foot with drainage.  Reports chronic ulceration of  the lateral left foot. Neurological: Denies headache.  Denies syncope.  Denies seizure activity.  Reports weakness in legs.  Denies paresthesia.  Slurred speech, drooping face.  Denies visual change. Psychiatric: Denies depression, anxiety.  Denies suicidal thoughts or ideation.  Denies hallucinations.  Past Medical History:  Diagnosis Date  . Abnormal results of thyroid function studies 08/16/2011  . Chronic kidney disease   . Coronary artery disease   . Diabetes mellitus without complication ([place])   . GERD (gastroesophageal reflux disease)   . Glaucoma   . Hypertension   . Overweight   . Partial blindness     Past Surgical History:  Procedure Laterality Date  . CARDIAC CATHETERIZATION    . EYE SURGERY    . 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    Social History  reports that he has never smoked. He has never used smokeless tobacco. He reports previous alcohol use. He reports that he does not use drugs.  Allergies  Allergen Reactions  . Shellfish Allergy Anaphylaxis  . Shellfish-Derived Products Anaphylaxis  . Iodinated Diagnostic Agents Other (See Comments)  . Iodine Other (See Comments)    Family History  Problem Relation Age of Onset  . Diabetes Mother   . Alcohol abuse Father      Prior to Admission medications   Medication Sig Start Date End Date Taking? Authorizing Provider  aspirin EC 81 MG tablet Take 81 mg by mouth. 11/18/14   [provider]  atorvastatin (LIPITOR) 40 MG tablet Take 1 tablet (40 mg total) by mouth daily. 03/07/19 03/01/20  O'NealCassie Freer, MD  Blood Glucose Monitoring Suppl (FIFTY50 GLUCOSE METER 2.0) w/Device KIT Use as instructed 11/13/14   [provider]  Cholecalciferol (VITAMIN D) 2000 units tablet Take by mouth. 06/04/15   [provider]  dorzolamide-timolol (COSOPT) 22.3-6.8 MG/ML ophthalmic solution INSTILL 1 DROP INTO LEFT EYE TWICE DAILY 11/06/18   [provider]  insulin glargine (LANTUS) 100 UNIT/ML  injection Inject 14 Units into the skin at bedtime.     [provider]  magnesium oxide (MAG-OX) 400 MG tablet Take 400 mg by mouth daily.    [provider]  metoprolol tartrate (LOPRESSOR) 50 MG tablet Take 75 mg by mouth 2 (two) times daily.  07/27/16 03/07/19  [provider]  NOVOLOG FLEXPEN 100 UNIT/ML FlexPen 8 units after breakfast, 8 units at lunch, and 6 units at bedtime 05/05/17   [provider]  omeprazole (PRILOSEC) 20 MG capsule Take 20 mg by mouth. 07/27/16 03/07/19  [provider]  silver sulfADIAZINE (SILVADENE) 1 % cream Apply pea-sized amount to wound daily. 01/04/19   Evelina Bucy, DPM  tadalafil (CIALIS) 20 MG tablet  03/06/19   [provider]  Kerrin Champagne PEN NEEDLES 32G X 4 MM MISC  12/01/18   [provider]    Physical Exam: Vitals:   10/28/19 0151 10/28/19 0317 10/28/19 0343 10/28/19 0350  BP: 127/78     Pulse:   Marland Kitchen)  108 (!) 108  Resp:  (!) 24 (!) 23 17  Temp:      TempSrc:      SpO2:   98% 95%  Weight:      Height:        Constitutional: NAD, calm, comfortable Vitals:   10/28/19 0151 10/28/19 0317 10/28/19 0343 10/28/19 0350  BP: 127/78     Pulse:   (!) 108 (!) 108  Resp:  (!) 24 (!) 23 17  Temp:      TempSrc:      SpO2:   98% 95%  Weight:      Height:       General: WDWN, Alert and oriented x 2.  Eyes: EOMI left eye, left pupil with normal reaction to light, lids and conjunctivae normal.  Sclera nonicteric HENT:  [place]/AT, external ears normal.  Nares patent without epistasis.  Mucous membranes are moist. Posterior pharynx clear of any exudate or lesions. Dentition in fair repair.  Neck: Soft, normal range of motion, supple, no masses, no thyromegaly.  Trachea midline Respiratory: clear to auscultation bilaterally, no wheezing, no crackles. Normal respiratory effort. No accessory muscle use.  Cardiovascular: Sinus tachycardia no murmurs / rubs / gallops.  Trace pedal edema. 1+ pedal  pulses.  Abdomen: Soft, no tenderness, nondistended, no rebound or guarding.  Obese.  No masses palpated.  Bowel sounds normoactive Musculoskeletal: FROM. no clubbing / cyanosis. No joint deformity upper and lower extremities. no contractures. Normal muscle tone.  Left lateral ankle and midfoot with superficial ulceration with granulation tissue without drainage.  Mild surrounding erythema noted.  Right plantar medial foot with 1 cm ulceration with purulent drainage and surrounding erythema.  Skin: Warm, dry, intact no rashes, lesions, ulcers. No induration Neurologic: CN 2-12 grossly intact.  Normal speech.  Diminished sensation in feet bilaterally to light touch.  Normal sensation in hands and upper extremities to light touch. patella DTR +1 bilaterally. Strength 5/5 in upper extremities.  Strength 4-5 in lower extremities. Psychiatric: Normal judgment with poor insight into medical condition.  Normal mood.    Labs on Admission: I have personally reviewed following labs and imaging studies  CBC: Recent Labs  Lab 10/28/19 0237 10/28/19 0512  WBC 20.3*  --   HGB 11.9* 11.9*  HCT 43.5 35.0*  MCV 92.2  --   PLT 316  --     Basic Metabolic Panel: Recent Labs  Lab 10/28/19 0237 10/28/19 0512  NA 130* 130*  K 6.4* 5.4*  CL 91*  --   CO2 14*  --   GLUCOSE 1,189*  --   BUN 47*  --   CREATININE 2.83*  --   CALCIUM 10.3  --     GFR: Estimated Creatinine Clearance: 39.2 mL/min (A) (by C-G formula based on SCr of 2.83 mg/dL (H)).  Liver Function Tests: Recent Labs  Lab 10/28/19 0237  AST 14*  ALT 16  ALKPHOS 93  BILITOT 1.6*  PROT 8.1  ALBUMIN 2.8*    Urine analysis:    Component Value Date/Time   COLORURINE STRAW (A) 10/28/2019 0509   APPEARANCEUR CLEAR 10/28/2019 0509   LABSPEC 1.026 10/28/2019 0509   PHURINE 5.0 10/28/2019 0509   GLUCOSEU >=500 (A) 10/28/2019 0509   HGBUR MODERATE (A) 10/28/2019 0509   BILIRUBINUR NEGATIVE 10/28/2019 0509   KETONESUR 20 (A)  10/28/2019 0509   PROTEINUR NEGATIVE 10/28/2019 0509   NITRITE NEGATIVE 10/28/2019 0509   LEUKOCYTESUR NEGATIVE 10/28/2019 0509    Radiological Exams on Admission:  Chest x-ray is ordered and pending  EKG: Independently reviewed.  EKG is reviewed.  Sinus tachycardia.  LVH by voltage criteria.  QTc 459  Assessment/Plan Principal Problem:   DKA (diabetic ketoacidosis) [facility]) Anthony Blanchard will be admitted to progressive care stepdown unit with DKA and sepsis. He has been given IV fluid hydration in the emergency room. He has been started on insulin infusion which will be continued.  Blood sugar will be monitored every hour and insulin infusion will be adjusted as indicated. When blood sugars below 250 patient be converted to D5 half-normal saline IV fluid. Patient kept n.p.o. until blood sugars below 250. Check hemoglobin A1c Monitor BMP every 4 hours Diabetes education to be provided in importance of taking his insulin and controlling his diabetes asked to patient    Sepsis [facility]) Patient with sepsis criteria of tachycardia, leukocytosis, disorientation.  Patient does have diabetic foot ulcer Placed on empiric antibiotics with cefepime, vancomycin and Flagyl. Obtain the emergency room and will monitor.    Hyperlipidemia Resume patient's statin therapy with Lipitor. Check lipid panel   Essential hypertension Continue metoprolol which patient takes for blood pressure at home.  Monitor blood pressure    Hyperkalemia Potassium was initially 6.4 and sample was not hemolyzed.  Patient was treated with IV fluid, insulin and calcium gluconate in the emergency room.  Repeat potassium in the emergency room was 5.4 after treatment.  Continue to monitor    CKD (chronic kidney disease) stage 4, GFR 15-29 ml/min (HCC) Patient with chronic kidney disease will monitor renal function with labs closely.    Diabetic foot ulcer (HCC) Patient with diabetic foot ulcer.  Consult wound care.  Antibiotics  as above    Kidney transplanted Stable.  Continue home medications for her mention of rejection        DVT prophylaxis: [place] score elevated.  Heparin for DVT prophylaxis Code Status:   Full code Family Communication:  Diagnosis and plan discussed with patient.  Questions answered.  Patient verbalized understanding and agrees with plan.  Patient is to follow as clinically indicated  Disposition Plan:   Patient is from:  Home  Anticipated DC to:  Home  Anticipated DC date:  Anticipate greater than 2 midnight stay in the hospital to treat medical condition  Anticipated DC barriers: Barrier to discharge is patient compliance.  Patient will need social work consult to evaluate if any home health needs      are required and can be arranged before discharge    Admission status:  Inpatient  Severity of Illness: The appropriate patient status for this patient is INPATIENT. Inpatient status is judged to be reasonable and necessary in order to provide the required intensity of service to ensure the patient's safety. The patient's presenting symptoms, physical exam findings, and initial radiographic and laboratory data in the context of their chronic comorbidities is felt to place them at high risk for further clinical deterioration. Furthermore, it is not anticipated that the patient will be medically stable for discharge from the hospital within 2 midnights of admission. The following factors support the patient status of inpatient.   " The patient's presenting symptoms include generalized weakness and disorientation. " The worrisome physical exam findings include confusion, generalized weakness.  Diabetic foot ulcer " The initial radiographic and laboratory data are worrisome because of blood sugar greater than 1000, labs consistent with DKA. " The chronic co-morbidities include diabetes mellitus, CAD, hyperlipidemia, hypertension with poor compliance   * I certify that at  the point of  admission it is my clinical judgment that the patient will require inpatient hospital care spanning beyond 2 midnights from the point of admission due to high intensity of service, high risk for further deterioration and high frequency of surveillance required.Yevonne Aline Izaiha Lo MD [facility]  How to contact the [facility] Attending or Consulting provider [place] or covering provider during after hours [place], for this patient?   1. Check the care team in [facility] and look for a) attending/consulting [facility] provider listed and b) the [facility] team listed 2. Log into [email or web address] and use [place]'s universal password to access. If you do not have the password, please contact the hospital operator. 3. Locate the [facility] provider you are looking for under [facility] and page to a number that you can be directly reached. 4. If you still have difficulty reaching the provider, please page the [facility] (Director on Call) for the Hospitalists listed on amion for assistance.  10/28/2019, 6:16 AM

## 2019-10-28 NOTE — Progress Notes (Signed)
Pt seen and examined, admitted earlier this morning by Dr.Chotiner Mr. Anthony Blanchard is a 44 year old male with history of diabetes, ESRD status post kidney transplant in 2016 at [facility], CKD with baseline creatinine around 1.5-1.7, GERD, CAD (status post PCI to RCA in 2009 and PCI to LAD in 2013) who presented to the ED after a fall with abrasions to her shoulder, laceration to his lip and abrasion to his forehead.  He complained of generalized weakness, feeling disoriented,, fall, denies loss of consciousness.  Admits to having high blood sugars in the last few weeks but not checking his blood sugars regularly and admits poor compliance with insulin, does not remember when he last used it, poor historian.  He also has an ulcer on his right foot which reportedly has been going on for 6 months to a year.  In the emergency room noted to have blood glucose of 1150 with high glucose and ketones in his urine, pH was 7.32 on ABG, [place] count greater than 20,000, creatinine was 2.83  DKA (diabetic ketoacidosis) (HCC) -Continue continue IV fluids and insulin drip per DKA protocol  -Follow-up hemoglobin A1c  -Monitor BMP every 4 hours  -Diabetes coordinator consult   Suspected sepsis  Diabetic foot ulcer  -Continue cefepime, discontinue vancomycin and Flagyl for now especially in the setting of AKI/transplanted kidney  -Will discuss with podiatry in a.m.  -Follow-up blood cultures  Acute kidney injury Stage III chronic kidney disease Status post renal transplant Hyperkalemia -Continue home antirejection meds -Continue IV fluids -Baseline creatinine is 1.5-1.7, creatinine on admission is 2.8, anticipate this will improve with treatment of DKA and hydration -Hyperkalemia treated in the ED with calcium gluconate/insulin, this is improving, monitor  Encephalopathy -Suspect this is metabolic encephalopathy in the setting of DKA, AKI and sepsis -Baseline unknown, attempted to reach significant other twice without  success to determine his baseline -Monitor with above treatment  DVT prophylaxis:       Heparin for DVT prophylaxis Code Status:              Full code Family communication: Discussed with patient, no family at bedside Dispo: Home pending treatment meant and improvement of DKA, sepsis, kidney failure  Domenic Polite, MD

## 2019-10-28 NOTE — ED Notes (Signed)
Lab advises they still do not have the blood that was sent down, despite the tube station being functional again.

## 2019-10-28 NOTE — ED Notes (Signed)
Pt found at the end of bed, disconnected himself from the monitor and urinated in the floor. Pt adjusted back in bed with assistance and condom cath placed on pt.

## 2019-10-28 NOTE — Plan of Care (Signed)
  Problem: Cardiac: Goal: Ability to maintain an adequate cardiac output will improve Outcome: Progressing   Problem: Health Behavior/Discharge Planning: Goal: Ability to identify and utilize available resources and services will improve Outcome: Progressing Goal: Ability to manage health-related needs will improve Outcome: Progressing   Problem: Fluid Volume: Goal: Ability to achieve a balanced intake and output will improve Outcome: Progressing   Problem: Metabolic: Goal: Ability to maintain appropriate glucose levels will improve Outcome: Progressing   Problem: Nutritional: Goal: Maintenance of adequate nutrition will improve Outcome: Progressing Goal: Maintenance of adequate weight for body size and type will improve Outcome: Progressing   Problem: Respiratory: Goal: Will regain and/or maintain adequate ventilation Outcome: Progressing   Problem: Urinary Elimination: Goal: Ability to achieve and maintain adequate renal perfusion and functioning will improve Outcome: Progressing

## 2019-10-28 NOTE — ED Triage Notes (Signed)
Pt comes via [facility] from home for a fall, abrasions to shoulders and laceration to lip and abrasion to forehead. Unknown LOC, refused c collar, CBG reading high

## 2019-10-28 NOTE — ED Notes (Signed)
Pt jittery answer questions asked most frequent resp;onse is I dont knmow

## 2019-10-28 NOTE — ED Provider Notes (Signed)
[facility] EMERGENCY DEPARTMENT Provider Note   CSN: [id number] Arrival date & time: 10/28/19  0142     History Chief Complaint  Patient presents with  . Fall  . Hyperglycemia    Anthony Blanchard is a 44 y.o. male.  Patient is a 44 year old male with history of chronic renal failure status post renal transplant, diabetes, coronary artery disease, blindness in the right eye.  He presents today for evaluation of weakness, disorientation and falls at home.  Blood sugars have been running higher than normal.  Patient denies to me he has been experiencing any fevers or chills.  The history is provided by the patient.  Fall This is a new problem. The current episode started 2 days ago. The problem occurs constantly. The problem has been gradually worsening. Nothing aggravates the symptoms. Nothing relieves the symptoms. He has tried nothing for the symptoms.       Past Medical History:  Diagnosis Date  . Abnormal results of thyroid function studies 08/16/2011  . Chronic kidney disease   . Coronary artery disease   . Diabetes mellitus without complication ([place])   . GERD (gastroesophageal reflux disease)   . Glaucoma   . Hypertension   . Overweight   . Partial blindness     Patient Active Problem List   Diagnosis Date Noted  . Hyperlipidemia 02/22/2019  . Trigger middle finger of left hand 12/18/2018  . CAD (coronary artery disease) 08/29/2017  . 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 Aftercare following organ transplant 12/12/2014  . 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  . Immunosuppressed status ([place]) 12/12/2014  . LVH (left ventricular hypertrophy) due to hypertensive disease 12/12/2014  . 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/10/2014  . Diabetes mellitus ([place]) 01/05/2011  . Hypertension, benign 12/25/2009    Past Surgical History:  Procedure Laterality Date  . CARDIAC CATHETERIZATION    . 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       Family History  Problem Relation Age of  Onset  . Diabetes Mother   . Alcohol abuse Father     Social History   Tobacco Use  . Smoking status: Never Smoker  . Smokeless tobacco: Never Used  Substance Use Topics  . Alcohol use: Not Currently  . Drug use: Not on file    Home Medications Prior to Admission medications   Medication Sig Start Date End Date Taking? Authorizing Provider  aspirin EC 81 MG tablet Take 81 mg by mouth. 11/18/14   [provider]  atorvastatin (LIPITOR) 40 MG tablet Take 1 tablet (40 mg total) by mouth daily. 03/07/19 03/01/20  O'NealCassie Freer, MD  Blood Glucose Monitoring Suppl (FIFTY50 GLUCOSE METER 2.0) w/Device KIT Use as instructed 11/13/14   [provider]  Cholecalciferol (VITAMIN D) 2000 units tablet Take by mouth. 06/04/15   [provider]  dorzolamide-timolol (COSOPT) 22.3-6.8 MG/ML ophthalmic solution INSTILL 1 DROP INTO LEFT EYE TWICE DAILY 11/06/18   [provider]  insulin glargine (LANTUS) 100 UNIT/ML injection Inject 14 Units into the skin at bedtime.     [provider]  magnesium oxide (MAG-OX) 400 MG tablet Take 400 mg by mouth daily.    [provider]  metoprolol tartrate (LOPRESSOR) 50 MG tablet Take 75 mg by mouth 2 (two) times daily.  07/27/16 03/07/19  [provider]  NOVOLOG FLEXPEN 100 UNIT/ML FlexPen 8 units after breakfast, 8 units at lunch, and 6 units at bedtime 05/05/17   [provider]  omeprazole (PRILOSEC) 20 MG capsule  Take 20 mg by mouth. 07/27/16 03/07/19  [provider]  silver sulfADIAZINE (SILVADENE) 1 % cream Apply pea-sized amount to wound daily. 01/04/19   Evelina Bucy, DPM  tadalafil (CIALIS) 20 MG tablet  03/06/19   [provider]  Flossie Buffy MICRO PEN NEEDLES 32G X 4 MM MISC  12/01/18   [provider]    Allergies    Shellfish allergy, Shellfish-derived products, Iodinated diagnostic agents, and Iodine  Review of Systems   Review of Systems  All  other systems reviewed and are negative.   Physical Exam Updated Vital Signs BP 127/78   Pulse (!) 105   Temp 98.1 F (36.7 C) (Oral)   Resp 16   Ht _0  (1.753 m)   Wt 99.8 kg   SpO2 99%   BMI 32.49 kg/m   Physical Exam Vitals and nursing note reviewed.  Constitutional:      General: He is not in acute distress.    Appearance: He is well-developed. He is ill-appearing. He is not diaphoretic.     Comments: Patient is chronically ill-appearing.  HENT:     Head: Normocephalic and atraumatic.     Mouth/Throat:     Mouth: Mucous membranes are dry.  Cardiovascular:     Rate and Rhythm: Normal rate and regular rhythm.     Heart sounds: No murmur heard.  No friction rub.  Pulmonary:     Effort: Pulmonary effort is normal. No respiratory distress.     Breath sounds: Normal breath sounds. No wheezing or rales.  Abdominal:     General: Bowel sounds are normal. There is no distension.     Palpations: Abdomen is soft.     Tenderness: There is no abdominal tenderness.  Musculoskeletal:        General: No swelling. Normal range of motion.     Cervical back: Normal range of motion and neck supple.  Skin:    General: Skin is warm and dry.  Neurological:     Mental Status: He is alert and oriented to person, place, and time.     Coordination: Coordination normal.     ED Results / Procedures / Treatments   Labs (all labs ordered are listed, but only abnormal results are displayed) Labs Reviewed  CBC - Abnormal; Notable for the following components:      Result Value   WBC 20.3 (*)    Hemoglobin 11.9 (*)    MCH 25.2 (*)    MCHC 27.4 (*)    RDW 16.3 (*)    All other components within normal limits  CBG MONITORING, ED - Abnormal; Notable for the following components:   Glucose-Capillary >600 (*)    All other components within normal limits  BASIC METABOLIC PANEL  URINALYSIS, ROUTINE W REFLEX MICROSCOPIC  LACTIC ACID, PLASMA  LACTIC ACID, PLASMA  HEPATIC FUNCTION PANEL    TROPONIN I (HIGH SENSITIVITY)    EKG EKG Interpretation  Date/Time:  Sunday October 28 2019 01:50:55 EDT Ventricular Rate:  105 PR Interval:    QRS Duration: 84 QT Interval:  347 QTC Calculation: 459 R Axis:   62 Text Interpretation: Sinus tachycardia Consider left ventricular hypertrophy Confirmed by Veryl Speak [phone]) on 10/28/2019 3:06:36 AM   Radiology No results found.  Procedures Procedures (including critical care time)  Medications Ordered in ED Medications  sodium chloride 0.9 % bolus 1,000 mL (has no administration in time range)    ED Course  I have reviewed the triage vital signs  and the nursing notes.  Pertinent labs & imaging results that were available during my care of the patient were reviewed by me and considered in my medical decision making (see chart for details).    MDM Rules/Calculators/A&P  Patient is a 44 year old male with history of renal insufficiency with renal transplant, diabetes presenting with weakness, nausea, and difficulty ambulating over the past several days.  Patient found to have blood sugar greater than 600 by EMS.  When patient arrived here, he was somewhat confused, but did answer questions appropriately.  He is somewhat tachycardic, but vitals otherwise stable.  Work-up shows markedly elevated glucose of nearly 1200 along with an anion gap acidosis consistent with diabetic ketoacidosis.  Patient also found to have Luther count of 20,000.  Additional laboratory studies obtained including blood cultures, urinalysis and blood gas.  pH is 7.3.  Patient given broad-spectrum antibiotics and insulin drip initiated.  I discussed care with PCCM who feels as though patient can be managed on stepdown.  I have spoken with the hospitalist, Dr. Tonie Griffith who agrees to admit.  CRITICAL CARE Performed by: Veryl Speak Total critical care time: 45 minutes Critical care time was exclusive of separately billable procedures and treating other  patients. Critical care was necessary to treat or prevent imminent or life-threatening deterioration. Critical care was time spent personally by me on the following activities: development of treatment plan with patient and/or surrogate as well as nursing, discussions with consultants, evaluation of patient's response to treatment, examination of patient, obtaining history from patient or surrogate, ordering and performing treatments and interventions, ordering and review of laboratory studies, ordering and review of radiographic studies, pulse oximetry and re-evaluation of patient's condition.   Final Clinical Impression(s) / ED Diagnoses Final diagnoses:  None    Rx / DC Orders ED Discharge Orders    None       Veryl Speak, MD 10/28/19 [phone]

## 2019-10-29 ENCOUNTER — Telehealth: Payer: Self-pay | Admitting: Podiatry

## 2019-10-29 ENCOUNTER — Inpatient Hospital Stay (HOSPITAL_COMMUNITY): Payer: Medicare HMO

## 2019-10-29 DIAGNOSIS — E1110 Type 2 diabetes mellitus with ketoacidosis without coma: Secondary | ICD-10-CM | POA: Diagnosis not present

## 2019-10-29 DIAGNOSIS — N184 Chronic kidney disease, stage 4 (severe): Secondary | ICD-10-CM | POA: Diagnosis not present

## 2019-10-29 DIAGNOSIS — E1310 Other specified diabetes mellitus with ketoacidosis without coma: Secondary | ICD-10-CM | POA: Diagnosis not present

## 2019-10-29 DIAGNOSIS — E11621 Type 2 diabetes mellitus with foot ulcer: Secondary | ICD-10-CM

## 2019-10-29 LAB — BASIC METABOLIC PANEL
Anion gap: 10 (ref 5–15)
Anion gap: 12 (ref 5–15)
BUN: 32 mg/dL — ABNORMAL HIGH (ref 6–20)
BUN: 32 mg/dL — ABNORMAL HIGH (ref 6–20)
CO2: 20 mmol/L — ABNORMAL LOW (ref 22–32)
CO2: 20 mmol/L — ABNORMAL LOW (ref 22–32)
Calcium: 10.1 mg/dL (ref 8.9–10.3)
Calcium: 10.3 mg/dL (ref 8.9–10.3)
Chloride: 112 mmol/L — ABNORMAL HIGH (ref 98–111)
Chloride: 109 mmol/L (ref 98–111)
Creatinine, Ser: 1.67 mg/dL — ABNORMAL HIGH (ref 0.61–1.24)
Creatinine, Ser: 1.66 mg/dL — ABNORMAL HIGH (ref 0.61–1.24)
GFR calc Af Amer: 57 mL/min — ABNORMAL LOW (ref >60)
GFR calc Af Amer: 58 mL/min — ABNORMAL LOW (ref >60)
GFR calc non Af Amer: 49 mL/min — ABNORMAL LOW (ref >60)
GFR calc non Af Amer: 50 mL/min — ABNORMAL LOW (ref >60)
Glucose, Bld: 347 mg/dL — ABNORMAL HIGH (ref 70–99)
Glucose, Bld: 362 mg/dL — ABNORMAL HIGH (ref 70–99)
Potassium: 4.4 mmol/L (ref 3.5–5.1)
Potassium: 4.6 mmol/L (ref 3.5–5.1)
Sodium: 142 mmol/L (ref 135–145)
Sodium: 141 mmol/L (ref 135–145)

## 2019-10-29 LAB — CBC
HCT: 35.9 % — ABNORMAL LOW (ref 39.0–52.0)
Hemoglobin: 10.6 g/dL — ABNORMAL LOW (ref 13.0–17.0)
MCH: 25.1 pg — ABNORMAL LOW (ref 26.0–34.0)
MCHC: 29.5 g/dL — ABNORMAL LOW (ref 30.0–36.0)
MCV: 84.9 fL (ref 80.0–100.0)
Platelets: 285 10*3/uL (ref 150–400)
RBC: 4.23 MIL/uL (ref 4.22–5.81)
RDW: 14.9 % (ref 11.5–15.5)
WBC: 17.6 10*3/uL — ABNORMAL HIGH (ref 4.0–10.5)
nRBC: 0 % (ref 0.0–0.2)

## 2019-10-29 LAB — URINE CULTURE: Culture: <10000 — AB

## 2019-10-29 LAB — GLUCOSE, CAPILLARY
Glucose-Capillary: 391 mg/dL — ABNORMAL HIGH (ref 70–99)
Glucose-Capillary: 215 mg/dL — ABNORMAL HIGH (ref 70–99)
Glucose-Capillary: 203 mg/dL — ABNORMAL HIGH (ref 70–99)
Glucose-Capillary: 186 mg/dL — ABNORMAL HIGH (ref 70–99)

## 2019-10-29 LAB — HEMOGLOBIN A1C
Hgb A1c MFr Bld: 12.1 % — ABNORMAL HIGH (ref 4.8–5.6)
Mean Plasma Glucose: 301 mg/dL

## 2019-10-29 MED ORDER — INSULIN ASPART 100 UNIT/ML ~~LOC~~ SOLN
3.0000 [IU] | Freq: Three times a day (TID) | SUBCUTANEOUS | Status: DC
  Administered 2019-10-29 – 2019-11-05 (×20): 3 [IU] via SUBCUTANEOUS

## 2019-10-29 MED ORDER — MYCOPHENOLATE SODIUM 180 MG PO TBEC
540.0000 mg | DELAYED_RELEASE_TABLET | Freq: Two times a day (BID) | ORAL | Status: DC
  Administered 2019-10-29 – 2019-11-05 (×14): 540 mg via ORAL
  Filled 2019-10-29 (×16): qty 3

## 2019-10-29 MED ORDER — INSULIN GLARGINE 100 UNIT/ML ~~LOC~~ SOLN
35.0000 [IU] | Freq: Every day | SUBCUTANEOUS | Status: DC
  Administered 2019-10-29 – 2019-11-05 (×8): 35 [IU] via SUBCUTANEOUS
  Filled 2019-10-29 (×8): qty 0.35

## 2019-10-29 MED ORDER — SODIUM CHLORIDE 0.9 % IV SOLN
12.5000 mg | Freq: Once | INTRAVENOUS | Status: AC
  Administered 2019-10-29: 12.5 mg via INTRAVENOUS
  Filled 2019-10-29: qty 0.5

## 2019-10-29 MED ORDER — CEFAZOLIN SODIUM-DEXTROSE 2-4 GM/100ML-% IV SOLN
2.0000 g | Freq: Three times a day (TID) | INTRAVENOUS | Status: DC
  Administered 2019-10-29 – 2019-11-04 (×20): 2 g via INTRAVENOUS
  Filled 2019-10-29 (×24): qty 100

## 2019-10-29 MED ORDER — TACROLIMUS 1 MG PO CAPS
6.0000 mg | ORAL_CAPSULE | Freq: Two times a day (BID) | ORAL | Status: DC
  Administered 2019-10-29 – 2019-11-05 (×14): 6 mg via ORAL
  Filled 2019-10-29 (×16): qty 6

## 2019-10-29 NOTE — Telephone Encounter (Signed)
Consult acknowledged. Spoke to Dr. Broadus John

## 2019-10-29 NOTE — Telephone Encounter (Signed)
Received a hospital consult request from Dr Domenic Polite @ [facility] for sepsis. Pt is in Room 6E21 @ [facility]. Pt was scheduled to see Dr March Rummage 7.16.2021 fior POV.  Dr Delene Loll cell # is [phone]

## 2019-10-29 NOTE — Progress Notes (Signed)
Inpatient Diabetes Program Recommendations  AACE/ADA: New Consensus Statement on Inpatient Glycemic Control (2015)  Target Ranges:  Prepandial:   less than 140 mg/dL      Peak postprandial:   less than 180 mg/dL (1-2 hours)      Critically ill patients:  140 - 180 mg/dL   Lab Results  Component Value Date   GLUCAP 391 (H) 10/29/2019   HGBA1C 7.2 (A) 02/22/2019    Review of Glycemic Control Results for Anthony Blanchard, Anthony Blanchard (MRN [id number]) as of 10/29/2019 09:54  Ref. Range 10/28/2019 16:04 10/28/2019 16:54 10/28/2019 18:17 10/28/2019 20:40 10/29/2019 07:34  Glucose-Capillary Latest Ref Range: 70 - 99 mg/dL 172 (H) 176 (H) 199 (H) 234 (H) 391 (H)   Diabetes history: DM Outpatient Diabetes medications: Lantus 16 units qd + Novolog meal coverage 4-8-6 units Current orders for Inpatient glycemic control: Lantus 35 units + Novolog 3 units tid meal coverage + Novolog moderate correction tid + hs 0-5 units  Inpatient Diabetes Program Recommendations:   Noted patient transitioned to subcutaneous insulin and patient has not been checking CBGs nor taking insulin as prescribed prior to admission. Will plan to speak with patient.  Thank you, Nani Gasser. Citlali Gautney, RN, MSN, CDE  Diabetes Coordinator Inpatient Glycemic Control Team Team Pager [phone] (8am-5pm) 10/29/2019 10:10 AM

## 2019-10-29 NOTE — Consult Note (Signed)
Podiatry Consult  To: Dr. Broadus John From: Dr. Cannon Kettle ([facility])  Consult regarding: Diabetic foot ulcers c/o sepsis   History of present illness: This 44 year old diabetic male patient seen at bedside for evaluation of bilateral foot ulcers.  Patient reports that he has had ulcers for years with the last one that has slowly gotten worse over the last 6 months.  Patient reports that he was brought to the hospital because of the worsening smell from his left foot ulcer by his sister.  Patient reports that previously the wound was very painful but now it does not hurt.  Patient denies nausea vomiting fever chills or any other constitutional symptoms at this time.  Patient also reports that he was scheduled to see Dr. March Rummage on Friday in office but due to the worsening smell decided to come to the hospital. Patient is visually impaired and has history of kidney disease status post transplant as well as poorly controlled diabetes.  Patient reports that he comes from home and has help from family and friends who have been helping to care for his ulcers and wrapping them but he does not recall what they have been using. Patient denies any other pedal complaints at this time.  Past Medical History:  Diagnosis Date  . Abnormal results of thyroid function studies 08/16/2011  . Chronic kidney disease   . Coronary artery disease   . Diabetes mellitus without complication ([place])   . GERD (gastroesophageal reflux disease)   . Glaucoma   . Hypertension   . Overweight   . Partial blindness      Current Facility-Administered Medications:  .  aspirin EC tablet 81 mg, 81 mg, Oral, Daily, Chotiner, Yevonne Aline, MD, 81 mg at 10/29/19 0817 .  atorvastatin (LIPITOR) tablet 40 mg, 40 mg, Oral, Daily, Chotiner, Yevonne Aline, MD, 40 mg at 10/29/19 0817 .  ceFAZolin (ANCEF) IVPB 2g/100 mL premix, 2 g, Intravenous, Q8H, Domenic Polite, MD, Last Rate: 200 mL/hr at 10/29/19 1302, 2 g at 10/29/19 1302 .   dextrose 50 % solution 0-50 mL, 0-50 mL, Intravenous, PRN, Delo, Douglas, MD .  dorzolamide-timolol (COSOPT) 22.3-6.8 MG/ML ophthalmic solution 1 drop, 1 drop, Left Eye, BID, Chotiner, Yevonne Aline, MD, 1 drop at 10/29/19 0823 .  heparin injection 5,000 Units, 5,000 Units, Subcutaneous, Q8H, Chotiner, Yevonne Aline, MD, 5,000 Units at 10/29/19 1304 .  insulin aspart (novoLOG) injection 0-15 Units, 0-15 Units, Subcutaneous, TID WC, Domenic Polite, MD, 5 Units at 10/29/19 1803 .  insulin aspart (novoLOG) injection 0-5 Units, 0-5 Units, Subcutaneous, QHS, Domenic Polite, MD, 2 Units at 10/28/19 2139 .  insulin aspart (novoLOG) injection 3 Units, 3 Units, Subcutaneous, TID WC, Domenic Polite, MD, 3 Units at 10/29/19 1804 .  insulin glargine (LANTUS) injection 35 Units, 35 Units, Subcutaneous, Daily, Domenic Polite, MD, 35 Units at 10/29/19 1210 .  insulin regular, human (MYXREDLIN) 100 units/ 100 mL infusion, , Intravenous, Continuous, Chotiner, Yevonne Aline, MD .  mycophenolate (MYFORTIC) EC tablet 540 mg, 540 mg, Oral, BID, Domenic Polite, MD .  ondansetron [facility]) injection 4 mg, 4 mg, Intravenous, Q6H PRN, Domenic Polite, MD .  tacrolimus (PROGRAF) capsule 6 mg, 6 mg, Oral, BID, Domenic Polite, MD  Allergies  Allergen Reactions  . Shellfish Allergy Anaphylaxis  . Shellfish-Derived Products Anaphylaxis  . Sulfa Antibiotics Anaphylaxis    Other reaction(s): Other (See Comments)  . Iodinated Diagnostic Agents Other (See Comments)    Other reaction(s): Other (see comments)  . Iodine Other (See  Comments)    Other reaction(s): Other (see comments)    Objective: Today's Vitals   10/29/19 0842 10/29/19 1131 10/29/19 1603 10/29/19 1928  BP: (!) 170/83 (!) 133/95 (!) 153/88   Pulse:  100 (!) 103 (!) 101  Resp:  18 18 18   Temp:  100.1 F (37.8 C) 98.9 F (37.2 C) 99 F (37.2 C)  TempSrc:  Oral Oral Oral  SpO2:  98% 99%   Weight:      Height:      PainSc:       Body mass index is 32.49  kg/m.   Physical Exam  Focused lower extremity exam: Derm: Full-thickness ulcer noted plantar lateral fifth metatarsal head on the right foot that measures 3 x 2 cm with fatty tissue exposed probes to bone with fibrogranular base, as pictured below.    There is a preulcerative lesion noted to the lateral malleolus of the left ankle.  There is a boggy full-thickness macerated and malodorous ulceration that measures 6 x 4 cm at the lateral left foot as pictured below that extends from the base of the fifth metatarsal to the level of the dorsal lateral midfoot with localized swelling, brown active drainage, no warmth no purulence expressed, mild pain to palpation to the ulcerated area.  Neurovascular status: Protective sensation diminished via light touch bilateral.  Nonpalpable DP and PT pedal pulses bilateral.  Musculoskeletal: Mild tenderness to palpation to ulcerated areas worse at left lateral foot, no pain to palpation to calf.  No acute signs of DVT       Assessment and plan: Problem List Items Addressed This Visit      Endocrine   * (Principal) DKA (diabetic ketoacidosis) ([place]) - Primary   Relevant Medications   aspirin EC tablet 81 mg   atorvastatin (LIPITOR) tablet 40 mg   insulin regular, human (MYXREDLIN) 100 units/ 100 mL infusion   insulin aspart (novoLOG) injection 0-5 Units   insulin aspart (novoLOG) injection 0-15 Units   insulin glargine (LANTUS) injection 35 Units   insulin aspart (novoLOG) injection 3 Units     Other   Sepsis ([facility])   Relevant Orders   DG Chest 1 View (Completed)   DG Foot 2 Views Right (Completed)    Other Visit Diagnoses    Ulcerated, foot ([place])       Relevant Orders   DG Foot 2 Views Left (Completed)     -Patient seen and evaluated -Dry dressings applied bilateral -X-rays reviewed with findings consistent and concerning for osteomyelitis bilateral however due to the advancing changes on the left and significant malodor to rule out  any deeper abscess recommend ordering MRI of this foot in order to plan for possible debridement or aggressive surgical intervention -Patient will also need vascular studies prior to any type of planned debridement procedure or surgical intervention in order to assess his ability to heal; ABIs ordered -Podiatry to follow-up once the above tests have been performed to determine definitive plan of care however at this time continue with broad-spectrum antibiotics and medical management of diabetes and other co-morbidities -Consult appreciated  Dr. Cannon Kettle [facility] [phone] office [phone] cell

## 2019-10-29 NOTE — Evaluation (Signed)
Occupational Therapy Evaluation Patient Details Name: Anthony Blanchard MRN: [id number] DOB: 03/18/1976 Today's Date: 10/29/2019    History of Present Illness 44 y.o. 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 In ED found to have CBG of >1150, ABG ph 7.32, tachycardia and sepsis. Admitted 10/28/19 for treatement of DKA, sepsis possibly due to R foot ulcer and encephalopathy.    Clinical Impression   Pt PTA: Pt reports he was living alone; assist from family at times. Pt currently with poor vision decreases pt's ability to perform own ADL and mobility. Pt unable to properly care for self, medicate self correctly and look for diabetic ulcers and has a lack of insight into deficits. Pt very fatigued after minimal exertion. Pt set-upA to modA overall for ADL and minA overall for sit to stand and taking steps to [facility]. Pt would benefit from continued OT skilled services. OT following acutely.     Follow Up Recommendations  SNF;Supervision/Assistance - 24 hour    Equipment Recommendations  Other (comment);3 in 1 bedside commode (defer to next facility)    Recommendations for Other Services       Precautions / Restrictions Precautions Precautions: Fall Precaution Comments: admitted after a fall Restrictions Weight Bearing Restrictions: No      Mobility Bed Mobility Overal bed mobility: Needs Assistance Bed Mobility: Supine to Sit     Supine to sit: Min assist     General bed mobility comments: pt requires minA for hand placement to come to EoB due to decreased vision.   Transfers Overall transfer level: Needs assistance Equipment used: 1 person hand held assist Transfers: Sit to/from Stand Sit to Stand: Min assist         General  transfer comment: min A for power up, BLEs difficult to accept wt bearing to take steps to [facility].    Balance Overall balance assessment: Needs assistance Sitting-balance support: Feet supported;No upper extremity supported Sitting balance-Leahy Scale: Fair     Standing balance support: Bilateral upper extremity supported;Single extremity supported Standing balance-Leahy Scale: Poor Standing balance comment: requires at least single UE support to steady                           ADL either performed or assessed with clinical judgement   ADL Overall ADL's : Needs assistance/impaired Eating/Feeding: Minimal assistance;Sitting Eating/Feeding Details (indicate cue type and reason): very difficult to see items on tray without feeling for them Grooming: Minimal assistance;Sitting;Standing   Upper Body Bathing: Minimal assistance;Sitting   Lower Body Bathing: Moderate assistance;Sitting/lateral leans;Sit to/from stand   Upper Body Dressing : Minimal assistance;Sitting   Lower Body Dressing: Moderate assistance;+2 for physical assistance;+2 for safety/equipment;Sitting/lateral leans;Sit to/from stand   Toilet Transfer: Minimal assistance;Stand-pivot;BSC   Toileting- Clothing Manipulation and Hygiene: Moderate assistance;Cueing for safety;Sitting/lateral lean;Sit to/from stand;+2 for physical assistance       Functional mobility during ADLs: Minimal assistance;+2 for physical assistance;Cueing for safety;Rolling walker General ADL Comments: Pt very much impacted for ADL tasks by poor vision and lack of insight into deficits. Pt very fatigued after minimal exertion.     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      Perception     Praxis      Pertinent Vitals/Pain Pain Assessment: 0-10 Pain Score: 4  Pain Location: bilateral feet due to diabetic ulcers  Pain Descriptors / Indicators: Aching;Grimacing;Guarding;Sore Pain Intervention(s): Monitored during session     Hand Dominance Right   Extremity/Trunk Assessment Upper Extremity Assessment Upper Extremity Assessment: Overall WFL for tasks assessed   Lower Extremity Assessment Lower Extremity Assessment: Defer to PT evaluation;RLE deficits/detail;LLE deficits/detail RLE Deficits / Details: R foot pain LLE Deficits / Details: L foot pain   Cervical / Trunk Assessment Cervical / Trunk Assessment: Normal   Communication Communication Communication: No difficulties   Cognition Arousal/Alertness: Awake/alert Behavior During Therapy: Flat affect Overall Cognitive Status: No family/caregiver present to determine baseline cognitive functioning Area of Impairment: Attention;Memory;Following commands;Safety/judgement;Awareness;Problem solving;Orientation                 Orientation Level: Disoriented to;Situation Current Attention Level: Selective Memory: Decreased short-term memory Following Commands: Follows multi-step commands with increased time;Follows one step commands with increased time;Follows multi-step commands inconsistently Safety/Judgement: Decreased awareness of safety;Decreased awareness of deficits Awareness: Emergent Problem Solving: Slow processing;Difficulty sequencing;Requires verbal cues;Requires tactile cues General Comments: Pt's poor vision decreases pt's ability to perform own ADL and mobility. Pt unable to properly care for self, medicate self correctly and look for diabetic ulcers.    General Comments  Pt reports "I see darkness with a little light." Poor vision- continue to test. VSS on RA.    Exercises     Shoulder Instructions      Home Living Family/patient expects to be discharged to:: Private  residence Living Arrangements: Alone Available Help at Discharge: Available PRN/intermittently;Family (sons ) Type of Home: Apartment Home Access: Level entry     Home Layout: One level     Bathroom Shower/Tub: Occupational psychologist: Standard Bathroom Accessibility: Yes   Home Equipment: None          Prior Functioning/Environment Level of Independence: Independent        Comments: pt reports independence but poor historian, states his sons assist with shopping but that he does, his own bathing and dressing        OT Problem List: Decreased strength;Decreased activity tolerance;Impaired balance (sitting and/or standing);Impaired vision/perception;Decreased cognition;Decreased safety awareness;Decreased knowledge of use of DME or AE;Impaired UE functional use;Pain;Increased edema      OT Treatment/Interventions: Self-care/ADL training;Therapeutic exercise;Energy conservation;DME and/or AE instruction;Therapeutic activities;Cognitive remediation/compensation;Visual/perceptual remediation/compensation;Patient/family education;Balance training    OT Goals(Current goals can be found in the care plan section) Acute Rehab OT Goals Patient Stated Goal: go home OT Goal Formulation: With patient Time For Goal Achievement: 11/12/19 Potential to Achieve Goals: Good ADL Goals Pt Will Perform Grooming: with min guard assist;standing Pt Will Perform Lower Body Dressing: with min guard assist;sitting/lateral leans;sit to/from stand Pt Will Transfer to Toilet: with min guard assist;ambulating;bedside commode Additional ADL Goal #1: Pt will utilize compensatory strategies to adapt to low vision in B/L eyes to increase carry over skills of navigating his environment safely wtih minimal cues. Additional ADL Goal #2: Pt will increase to minguardA overall for OOB ADL tasks.  OT Frequency: Min 2X/week   Barriers to D/C: Decreased caregiver support  reports sister can assist upon  d/c- need to confirm this.       Co-evaluation              AM-PAC OT "6 Clicks" Daily  Activity     Outcome Measure Help from another person eating meals?: None Help from another person taking care of personal grooming?: A Little Help from another person toileting, which includes using toliet, bedpan, or urinal?: A Lot Help from another person bathing (including washing, rinsing, drying)?: A Lot Help from another person to put on and taking off regular upper body clothing?: A Little Help from another person to put on and taking off regular lower body clothing?: A Lot 6 Click Score: 16   End of Session Nurse Communication: Mobility status  Activity Tolerance: Patient limited by pain;Patient limited by fatigue Patient left: in bed;with call bell/phone within reach;with nursing/sitter in room  OT Visit Diagnosis: Unsteadiness on feet (R26.81);Muscle weakness (generalized) (M62.81);Pain;Low vision, both eyes (H54.2) Pain - part of body: Ankle and joints of foot (b/l )                Time: 8309-4076 OT Time Calculation (min): 15 min Charges:  OT General Charges $OT Visit: 1 Visit OT Evaluation $OT Eval Moderate Complexity: 1 Mod  Jefferey Pica, OTR/L Acute Rehabilitation Services Pager: [phone] Office: [phone]   Shuayb Schepers C 10/29/2019, 5:39 PM

## 2019-10-29 NOTE — Evaluation (Signed)
Physical Therapy Evaluation Patient Details Name: Anthony Blanchard MRN: [id number] DOB: 09-Jan-1976 Today's Date: 10/29/2019   History of Present Illness  44 y.o. 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 In ED found to have CBG of >1150, ABG ph 7.32, tachycardia and sepsis. Admitted 10/28/19 for treatement of DKA, sepsis possibly due to R foot ulcer and encephalopathy.   Clinical Impression  Pt has poor understanding of his deficits, especially visually (due to inability to see more than light and dark through L eye) and impact of visual deficit on his safety awareness, in terms of DM management, mobility, self care (knowledge of diabetic ulcers). PTA pt reports living alone in a studio apartment with level entry. Pt reports independence in mobility (despite fall prior to admission) and self care (despite no knowledge of CBG). Pt reports his sons assist with getting groceries. Pt currently requires min A for bed mobility and transfers and modA for stability with lateral stepping towards HoB due to pain in bilateral feet with weightbearing. RN received in report that sister was present yesterday and that pt may be moving to live with her. Pt reports maybe he could live with her. No contact information in chart to confirm so PT currently recommending SNF level rehab for improving safety awareness with mobility and self care, unless sister can provide 24 hr care. PT will continue to follow acutely.     Follow Up Recommendations SNF;Supervision/Assistance - 24 hour    Equipment Recommendations  Other (comment) (TBD at next venue)       Precautions / Restrictions Precautions Precautions: Fall Precaution Comments: admitted after a fall Restrictions Weight  Bearing Restrictions: No      Mobility  Bed Mobility Overal bed mobility: Needs Assistance Bed Mobility: Supine to Sit     Supine to sit: Min assist     General bed mobility comments: pt requires minA for hand placement to come to EoB due to decreased vision.   Transfers Overall transfer level: Needs assistance Equipment used: 1 person hand held assist Transfers: Sit to/from Stand Sit to Stand: Min assist         General transfer comment: min A for power up, pt with L hand on foot board to steady in standing, decreased weightshift to R LE  Ambulation/Gait Ambulation/Gait assistance: Mod assist Gait Distance (Feet): 2 Feet Assistive device: 1 person hand held assist Gait Pattern/deviations: Step-to pattern;Decreased weight shift to right;Decreased stance time - right Gait velocity: slowed Gait velocity interpretation: <1.31 ft/sec, indicative of household ambulator General Gait Details: modA for stabilzing with weightshift onto R LE, requires tactile cue of bed on back of LE to make it HoB      Balance Overall balance assessment: Needs assistance Sitting-balance support: Feet supported;No upper extremity supported Sitting balance-Leahy Scale: Fair     Standing balance support: Bilateral upper extremity supported;Single extremity supported Standing balance-Leahy Scale: Poor Standing balance comment: requires at least single UE support to steady                             Pertinent Vitals/Pain Pain Assessment: 0-10 Pain Score: 3  Pain Location: bilateral feet due to diabetic ulcers  Pain Descriptors / Indicators: Aching;Grimacing;Guarding;Sore Pain Intervention(s): Limited activity within patient's tolerance;Monitored during session;Repositioned  Home Living Family/patient expects to be discharged to:: Private residence Living Arrangements: Alone Available Help at Discharge: Available PRN/intermittently;Family (sons ) Type of Home:  Apartment Home Access: Level entry     Home Layout: One level Home Equipment: None      Prior Function Level of Independence: Independent         Comments: pt reports independence but poor historian, states his sons assist with shopping but that he does, his own bathing and dressing        Extremity/Trunk Assessment   Upper Extremity Assessment Upper Extremity Assessment: Overall WFL for tasks assessed    Lower Extremity Assessment Lower Extremity Assessment: RLE deficits/detail;LLE deficits/detail RLE Deficits / Details: R foot pain limiting ankle ROM, knee and hip ROM WFL, strength grossly assessed 4/5  RLE: Unable to fully assess due to pain RLE Sensation: decreased light touch RLE Coordination: decreased fine motor LLE Deficits / Details: L foot pain limits ankle ROM, knee and hip WFL, strength grossly 4/5 LLE: Unable to fully assess due to pain LLE Sensation: decreased light touch LLE Coordination: decreased fine motor       Communication   Communication: No difficulties  Cognition Arousal/Alertness: Awake/alert Behavior During Therapy: Restless Overall Cognitive Status: No family/caregiver present to determine baseline cognitive functioning Area of Impairment: Attention;Memory;Following commands;Safety/judgement;Awareness;Problem solving;Orientation                 Orientation Level: Disoriented to;Situation Current Attention Level: Selective Memory: Decreased short-term memory Following Commands: Follows multi-step commands with increased time;Follows one step commands with increased time;Follows multi-step commands inconsistently Safety/Judgement: Decreased awareness of safety;Decreased awareness of deficits Awareness: Emergent Problem Solving: Slow processing;Difficulty sequencing;Requires verbal cues;Requires tactile cues General Comments: pt with decreased awareness of situation, of level of visual deficit and impact on decreased safety in terms of  DM management, wound assessment, even eating lunch in front of him      General Comments General comments (skin integrity, edema, etc.): diabetic ulcers on bilateral feet contributing to decreased mobility due to pain         Assessment/Plan    PT Assessment Patient needs continued PT services  PT Problem List Decreased activity tolerance;Decreased balance;Decreased mobility;Decreased cognition;Decreased coordination;Decreased safety awareness;Impaired tone;Decreased skin integrity;Pain       PT Treatment Interventions DME instruction;Gait training;Functional mobility training;Therapeutic activities;Therapeutic exercise;Balance training;Cognitive remediation;Patient/family education    PT Goals (Current goals can be found in the Care Plan section)  Acute Rehab PT Goals Patient Stated Goal: go home PT Goal Formulation: With patient Time For Goal Achievement: 11/12/19 Potential to Achieve Goals: Fair    Frequency Min 2X/week   Barriers to discharge Decreased caregiver support         AM-PAC PT "6 Clicks" Mobility  Outcome Measure Help needed turning from your back to your side while in a flat bed without using bedrails?: None Help needed moving from lying on your back to sitting on the side of a flat bed without using bedrails?: None Help needed moving to and from a bed to a chair (including a wheelchair)?: A Little Help needed standing up from a chair using your arms (e.g., wheelchair or bedside chair)?: A Little Help needed to walk in hospital room?: A Lot Help needed climbing 3-5 steps with a railing? : A Lot 6 Click Score: 18    End of Session Equipment Utilized During Treatment: Gait belt Activity Tolerance: Patient limited by pain Patient left: in bed;with call bell/phone within reach;with bed alarm set Nurse Communication: Mobility  status PT Visit Diagnosis: Unsteadiness on feet (R26.81);Other abnormalities of gait and mobility (R26.89);History of falling  (Z91.81);Muscle weakness (generalized) (M62.81);Difficulty in walking, not elsewhere classified (R26.2);Adult, failure to thrive (R62.7);Pain Pain - Right/Left:  (bilateral feet) Pain - part of body: Ankle and joints of foot    Time: 1201-1225 PT Time Calculation (min) (ACUTE ONLY): 24 min   Charges:   PT Evaluation $PT Eval Moderate Complexity: 1 Mod PT Treatments $Gait Training: 8-22 mins        Briarrose Shor B. Migdalia Dk PT, DPT Acute Rehabilitation Services Pager [phone] Office [phone]   [place] 10/29/2019, 1:06 PM

## 2019-10-29 NOTE — Progress Notes (Signed)
PROGRESS NOTE    DICKIE CLOE  TFT:[id number] DOB: Mar 11, 1976 DOA: 10/28/2019 PCP: Pleas Koch, NP  Brief Narrative: Mr. Sayre is a 44 year old male with history of diabetes, ESRD status post kidney transplant in 2016 at [facility], CKD with baseline creatinine around 1.5-1.7, GERD, CAD (status post PCI to RCA in 2009 and PCI to LAD in 2013)who presented to the ED after a fall with abrasions to her shoulder, laceration to his lip and abrasion to his forehead.  He complained of generalized weakness, feeling disoriented,, fall, denies loss of consciousness.  Admits to having high blood sugars in the last few weeks but not checking his blood sugars regularly and admits poor compliance with insulin.  He also has an ulcer on his right foot which reportedly has been going on for 6 months to a year.  In the emergency room noted to have blood glucose of 1150 with high glucose and ketones in his urine, pH was 7.32 on ABG, Fazzini count greater than 20,000, creatinine was 2.83  Assessment & Plan:   DKA (diabetic ketoacidosis) ([place]) Uncontrolled type 2 diabetes mellitus with hyperglycemia -Secondary to poor compliance with insulin and foot infection -Clinically improving, weaned off insulin drip yesterday -CBGs poorly controlled, increase Lantus and add meal coverage and insulin -Hemoglobin A1c is 12.1 indicating long-term poor control -Diabetes coordinator consulted  Sepsis Diabetic foot ulcer  Strep agalactiae bacteremia in 1 out of 2 blood cultures -Antibiotics changed to IV Ancef -Check x-ray right foot -Will request podiatry consult, patient was supposed to see Dr. March Rummage later this week for same  Acute kidney injury Stage III chronic kidney disease Status post renal transplant Hyperkalemia -Baseline creatinine is 1.5-1.7, creatinine on admission is 2.8, improving, closer to baseline now, resume Myfortic and Prograf -Hyperkalemia has resolved  Encephalopathy -metabolic encephalopathy  in the setting of DKA, AKI and sepsis -Mental status improved  DVT prophylaxis: Heparin for DVT prophylaxis Code Status:Full code Family communication: Discussed with patient, no family at bedside, left msg for girlfriend Tomeka yesterday  Disposition Plan:  Status is: Inpatient  Remains inpatient appropriate because:Inpatient level of care appropriate due to severity of illness  Dispo: The patient is from: Home              Anticipated d/c is to: Home              Anticipated d/c date is: 2- 3 days              Patient currently is not medically stable to d/c.  Consultants:   Podiatry   Procedures:   Antimicrobials:    Subjective: -Feels better today, mind is clearer, no nausea or vomiting  Objective: Vitals:   10/29/19 0455 10/29/19 0802 10/29/19 0842 10/29/19 1131  BP: 139/76 (!) 190/80 (!) 170/83 (!) 133/95  Pulse:  96  100  Resp:  18  18  Temp: 98.6 F (37 C) 98.2 F (36.8 C)  100.1 F (37.8 C)  TempSrc: Oral Oral  Oral  SpO2: 99% 98%  98%  Weight:      Height:        Intake/Output Summary (Last 24 hours) at 10/29/2019 1506 Last data filed at 10/28/2019 1529 Gross per 24 hour  Intake 841.25 ml  Output --  Net 841.25 ml   Filed Weights   10/28/19 0148  Weight: 99.8 kg    Examination:  General exam: Chronically ill male, appears older than stated age, awake alert oriented to self place and partly  to time HEENT: Right eyelid is closed, no JVD CVS: S1-S2, regular rate rhythm Lungs: Clear bilaterally Abdomen: Soft, mildly distended, nontender, bowel sounds present Extremities: Right lateral midfoot with ulcer, with purulence, positive pulses Skin: As above Psychiatry:  Mood & affect appropriate.     Data Reviewed:   CBC: Recent Labs  Lab 10/28/19 0237 10/28/19 0512 10/29/19 0416  WBC 20.3*  --  17.6*  HGB 11.9* 11.9* 10.6*  HCT 43.5 35.0* 35.9*  MCV 92.2  --  84.9  PLT 316  --  373   Basic Metabolic Panel: Recent  Labs  Lab 10/28/19 0237 10/28/19 0512 10/28/19 1353 10/29/19 0209 10/29/19 0416  NA 130* 130* 145 142 141  K 6.4* 5.4* 4.2 4.4 4.6  CL 91*  --  110 112* 109  CO2 14*  --  22 20* 20*  GLUCOSE 1,189*  --  280* 347* 362*  BUN 47*  --  40* 32* 32*  CREATININE 2.83*  --  2.02* 1.67* 1.66*  CALCIUM 10.3  --  10.4* 10.1 10.3   GFR: Estimated Creatinine Clearance: 66.8 mL/min (A) (by C-G formula based on SCr of 1.66 mg/dL (H)). Liver Function Tests: Recent Labs  Lab 10/28/19 0237  AST 14*  ALT 16  ALKPHOS 93  BILITOT 1.6*  PROT 8.1  ALBUMIN 2.8*   No results for input(s): LIPASE, AMYLASE in the last 168 hours. No results for input(s): AMMONIA in the last 168 hours. Coagulation Profile: Recent Labs  Lab 10/28/19 1259  INR 1.3*   Cardiac Enzymes: No results for input(s): CKTOTAL, CKMB, CKMBINDEX, TROPONINI in the last 168 hours. BNP (last 3 results) No results for input(s): PROBNP in the last 8760 hours. HbA1C: Recent Labs    10/28/19 0840  HGBA1C 12.1*   CBG: Recent Labs  Lab 10/28/19 1654 10/28/19 1817 10/28/19 2040 10/29/19 0734 10/29/19 1115  GLUCAP 176* 199* 234* 391* 215*   Lipid Profile: No results for input(s): CHOL, HDL, LDLCALC, TRIG, CHOLHDL, LDLDIRECT in the last 72 hours. Thyroid Function Tests: No results for input(s): TSH, T4TOTAL, FREET4, T3FREE, THYROIDAB in the last 72 hours. Anemia Panel: No results for input(s): VITAMINB12, FOLATE, FERRITIN, TIBC, IRON, RETICCTPCT in the last 72 hours. Urine analysis:    Component Value Date/Time   COLORURINE STRAW (A) 10/28/2019 0509   APPEARANCEUR CLEAR 10/28/2019 0509   LABSPEC 1.026 10/28/2019 0509   PHURINE 5.0 10/28/2019 0509   GLUCOSEU >=500 (A) 10/28/2019 0509   HGBUR MODERATE (A) 10/28/2019 0509   BILIRUBINUR NEGATIVE 10/28/2019 0509   KETONESUR 20 (A) 10/28/2019 0509   PROTEINUR NEGATIVE 10/28/2019 0509   NITRITE NEGATIVE 10/28/2019 0509   LEUKOCYTESUR NEGATIVE 10/28/2019 0509   Sepsis  Labs: @LABRCNTIP (procalcitonin:4,lacticidven:4)  ) Recent Results (from the past 240 hour(s))  Blood culture (routine x 2)     Status: None (Preliminary result)   Collection Time: 10/28/19  4:55 AM   Specimen: BLOOD RIGHT ARM  Result Value Ref Range Status   Specimen Description BLOOD RIGHT ARM  Final   Special Requests   Final    BOTTLES DRAWN AEROBIC AND ANAEROBIC Blood Culture adequate volume   Culture   Final    NO GROWTH 1 DAY Performed at [facility], [place] [address], [place], [place] [address]    Report Status PENDING  Incomplete  Blood culture (routine x 2)     Status: None (Preliminary result)   Collection Time: 10/28/19  5:02 AM   Specimen: BLOOD LEFT ARM  Result  Value Ref Range Status   Specimen Description BLOOD LEFT ARM  Final   Special Requests   Final    BOTTLES DRAWN AEROBIC AND ANAEROBIC Blood Culture adequate volume   Culture  Setup Time   Final    GRAM POSITIVE COCCI IN CHAINS AEROBIC BOTTLE ONLY CRITICAL RESULT CALLED TO, READ BACK BY AND VERIFIED WITH: PHARMD LAURA SEAY AT2252 BY MESSAN H. ON 10/28/2019 Performed at [facility], [place] [address] [place], [place] [address]    Culture GRAM POSITIVE COCCI  Final   Report Status PENDING  Incomplete  Blood Culture ID Panel (Reflexed)     Status: Abnormal   Collection Time: 10/28/19  5:02 AM  Result Value Ref Range Status   Enterococcus species NOT DETECTED NOT DETECTED Final   Listeria monocytogenes NOT DETECTED NOT DETECTED Final   Staphylococcus species NOT DETECTED NOT DETECTED Final   Staphylococcus aureus (BCID) NOT DETECTED NOT DETECTED Final   Streptococcus species DETECTED (A) NOT DETECTED Final    Comment: CRITICAL RESULT CALLED TO, READ BACK BY AND VERIFIED WITH: PHARMD LAURA SEAY AT 2252 BY MESSAN H. ON  10/28/2019    Streptococcus agalactiae DETECTED (A) NOT DETECTED Final    Comment: CRITICAL RESULT CALLED TO, READ BACK BY AND VERIFIED WITH: PHARMD LAURA SEAY AT 2252 BY MESSAN H. ON   10/28/2019    Streptococcus pneumoniae NOT DETECTED NOT DETECTED Final   Streptococcus pyogenes NOT DETECTED NOT DETECTED Final   Acinetobacter baumannii NOT DETECTED NOT DETECTED Final   Enterobacteriaceae species NOT DETECTED NOT DETECTED Final   Enterobacter cloacae complex NOT DETECTED NOT DETECTED Final   Escherichia coli NOT DETECTED NOT DETECTED Final   Klebsiella oxytoca NOT DETECTED NOT DETECTED Final   Klebsiella pneumoniae NOT DETECTED NOT DETECTED Final   Proteus species NOT DETECTED NOT DETECTED Final   Serratia marcescens NOT DETECTED NOT DETECTED Final   Haemophilus influenzae NOT DETECTED NOT DETECTED Final   Neisseria meningitidis NOT DETECTED NOT DETECTED Final   Pseudomonas aeruginosa NOT DETECTED NOT DETECTED Final   Candida albicans NOT DETECTED NOT DETECTED Final   Candida glabrata NOT DETECTED NOT DETECTED Final   Candida krusei NOT DETECTED NOT DETECTED Final   Candida parapsilosis NOT DETECTED NOT DETECTED Final   Candida tropicalis NOT DETECTED NOT DETECTED Final    Comment: Performed at [facility], [address], [place], [place] [address]  Urine culture     Status: Abnormal   Collection Time: 10/28/19  5:09 AM   Specimen: Urine, Random  Result Value Ref Range Status   Specimen Description URINE, RANDOM  Final   Special Requests NONE  Final   Culture (A)  Final    <10,000 COLONIES/mL INSIGNIFICANT GROWTH Performed at [facility], [place] [address], [place],  [address]    Report Status 10/29/2019 FINAL  Final  SARS Coronavirus 2 by RT PCR (hospital order, performed in [facility] hospital lab) Nasopharyngeal Nasopharyngeal Swab     Status: None   Collection Time: 10/28/19  7:37 AM   Specimen: Nasopharyngeal Swab  Result Value Ref Range Status   SARS Coronavirus 2 NEGATIVE NEGATIVE Final    Comment: (NOTE) SARS-CoV-2 target nucleic acids are NOT DETECTED.  The SARS-CoV-2 RNA is generally detectable in upper and lower respiratory  specimens during the acute phase of infection. The lowest concentration of SARS-CoV-2 viral copies this assay can detect is 250 copies / mL. A negative result does not preclude SARS-CoV-2 infection and  should not be used as the sole basis for treatment or other patient management decisions.  A negative result may occur with improper specimen collection / handling, submission of specimen other than nasopharyngeal swab, presence of viral mutation(s) within the areas targeted by this assay, and inadequate number of viral copies (<250 copies / mL). A negative result must be combined with clinical observations, patient history, and epidemiological information.  Fact Sheet for Patients:   StrictlyIdeas.no  Fact Sheet for Healthcare Providers: BankingDealers.co.za  This test is not yet approved or  cleared by the [place] FDA and has been authorized for detection and/or diagnosis of SARS-CoV-2 by FDA under an Emergency Use Authorization (EUA).  This EUA will remain in effect (meaning this test can be used) for the duration of the COVID-19 declaration under Section 564(b)(1) of the Act, 21 U.S.C. section 360bbb-3(b)(1), unless the authorization is terminated or revoked sooner.  Performed at [facility], [place] [address], [place], [place] [address]          Radiology Studies: DG Chest 1 View  Result Date: 10/28/2019 CLINICAL DATA:  Sepsis EXAM: CHEST  1 VIEW COMPARISON:  None. FINDINGS: The heart size and mediastinal contours are within normal limits. Both lungs are clear. The visualized skeletal structures are unremarkable. IMPRESSION: No active disease. Electronically Signed   By: Ulyses Jarred M.D.   On: 10/28/2019 06:28        Scheduled Meds:  aspirin EC  81 mg Oral Daily   atorvastatin  40 mg Oral Daily   dorzolamide-timolol  1 drop Left Eye BID   heparin  5,000 Units Subcutaneous Q8H   insulin aspart  0-15  Units Subcutaneous TID WC   insulin aspart  0-5 Units Subcutaneous QHS   insulin aspart  3 Units Subcutaneous TID WC   insulin glargine  35 Units Subcutaneous Daily   mycophenolate  540 mg Oral BID   tacrolimus  6 mg Oral BID   Continuous Infusions:   ceFAZolin (ANCEF) IV 2 g (10/29/19 1302)   chlorproMAZINE (THORAZINE) IV     insulin       LOS: 1 day    Time spent: 38min  Domenic Polite, MD [facility] 10/29/2019, 3:06 PM

## 2019-10-29 NOTE — Progress Notes (Signed)
Chaplain visited as a result of rounding. The patient did not need spiritual care services. The chaplain is available if the patient requests a visit.  Brion Aliment Chaplain Resident For questions concerning this note please contact me by pager [phone]

## 2019-10-29 NOTE — TOC Initial Note (Signed)
Transition of Care [facility]) - Initial/Assessment Note    Patient Details  Name: Anthony Blanchard MRN: [id number] Date of Birth: 02-Dec-1975  [facility]) CM/SW Contact:    Trula Ore, [place] Phone Number: 10/29/2019, 5:07 PM  Clinical Narrative:                  CSW spoke with patient at bedside. Patient declined SNF at this time. Patient said his plan is to go home with his sister Anthony Blanchard at her home in [place]. Patient said he will follow up with his PCP from there and see about going to short term rehab. CSW tried to call patients sister Anthony Blanchard to follow up with her about plan for patient. CSW was not able to leave a voicemail. CSW will try to call patients sister in the morning to follow up about plan for patient.  CSW will continue to follow.  Expected Discharge Plan: Home/Self Care Barriers to Discharge: Continued Medical Work up   Patient Goals and CMS Choice Patient states their goals for this hospitalization and ongoing recovery are:: to go home with sister Anthony Blanchard in [place] and follow up with PCP CMS Medicare.gov Compare Post Acute Care list provided to:: Patient Choice offered to / list presented to : Patient  Expected Discharge Plan and Services Expected Discharge Plan: Home/Self Care       Living arrangements for the past 2 months: Single Family Home                                      Prior Living Arrangements/Services Living arrangements for the past 2 months: Single Family Home Lives with:: Self Patient language and need for interpreter reviewed:: Yes Do you feel safe going back to the place where you live?: No   Patient says he is going to stay with his sister Anthony Blanchard in [place]  Need for Family Participation in Patient Care: Yes (Comment) Care giver support system in place?: Yes (comment)   Criminal Activity/Legal Involvement Pertinent to Current Situation/Hospitalization: No - Comment as needed  Activities of Daily Living       Permission Sought/Granted Permission sought to share information with : Case Manager, Family Supports, Customer service manager Permission granted to share information with : Yes, Verbal Permission Granted  Share Information with NAME: Anthony Blanchard     Permission granted to share info w Relationship: Sister  Permission granted to share info w Contact Information: Anthony Blanchard [phone]  Emotional Assessment Appearance:: Appears stated age Attitude/Demeanor/Rapport: Gracious Affect (typically observed): Calm Orientation: : Oriented to Self, Oriented to Place, Oriented to  Time, Oriented to Situation Alcohol / Substance Use: Not Applicable Psych Involvement: No (comment)  Admission diagnosis:  DKA (diabetic ketoacidoses) ([place]) [E11.10] Sepsis ([place]) [A41.9] Diabetic ketoacidosis without coma associated with other specified diabetes mellitus ([place]) [E13.10] Patient Active Problem List   Diagnosis Date Noted   DKA (diabetic ketoacidosis) ([place]) 10/28/2019   Sepsis ([place]) 10/28/2019   Essential hypertension 10/28/2019   Hyperkalemia 10/28/2019   CKD (chronic kidney disease) stage 4, GFR 15-29 ml/min ([place]) 10/28/2019   Diabetic foot ulcer ([place]) 10/28/2019   DKA (diabetic ketoacidoses) ([place]) 10/28/2019   Hyperlipidemia 02/22/2019   Trigger middle finger of left hand 12/18/2018   CAD (coronary artery disease) 08/29/2017   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

## 2019-10-29 NOTE — Unmapped (Signed)
Spoke to sister Benjamin Maldonado who states patient was admitted at [facility].  Reports n/v and weakness for the past week and when sister came to see him he was confused so she took him to the hospital.    Sister was unsure what was happening with patient and asked if I had been updated.  Let her know that I do not get notified if patient's are admitted in other systems.  Did pull up notes in [facility] and patient admitted for sepsis from foot ulcers, DKA, AKI. Updated sister    Sister will call when patient is discharge if she has any additional questions or concerns.

## 2019-10-30 ENCOUNTER — Inpatient Hospital Stay (HOSPITAL_COMMUNITY): Payer: Medicare HMO

## 2019-10-30 DIAGNOSIS — E1310 Other specified diabetes mellitus with ketoacidosis without coma: Secondary | ICD-10-CM | POA: Diagnosis not present

## 2019-10-30 DIAGNOSIS — N184 Chronic kidney disease, stage 4 (severe): Secondary | ICD-10-CM | POA: Diagnosis not present

## 2019-10-30 DIAGNOSIS — E1110 Type 2 diabetes mellitus with ketoacidosis without coma: Secondary | ICD-10-CM | POA: Diagnosis not present

## 2019-10-30 DIAGNOSIS — L0390 Cellulitis, unspecified: Secondary | ICD-10-CM

## 2019-10-30 LAB — GLUCOSE, CAPILLARY
Glucose-Capillary: 180 mg/dL — ABNORMAL HIGH (ref 70–99)
Glucose-Capillary: 166 mg/dL — ABNORMAL HIGH (ref 70–99)
Glucose-Capillary: 177 mg/dL — ABNORMAL HIGH (ref 70–99)
Glucose-Capillary: 161 mg/dL — ABNORMAL HIGH (ref 70–99)

## 2019-10-30 LAB — BASIC METABOLIC PANEL
Anion gap: 8 (ref 5–15)
BUN: 21 mg/dL — ABNORMAL HIGH (ref 6–20)
CO2: 23 mmol/L (ref 22–32)
Calcium: 9.4 mg/dL (ref 8.9–10.3)
Chloride: 104 mmol/L (ref 98–111)
Creatinine, Ser: 1.32 mg/dL — ABNORMAL HIGH (ref 0.61–1.24)
GFR calc Af Amer: >60 mL/min (ref >60)
GFR calc non Af Amer: >60 mL/min (ref >60)
Glucose, Bld: 163 mg/dL — ABNORMAL HIGH (ref 70–99)
Potassium: 4 mmol/L (ref 3.5–5.1)
Sodium: 135 mmol/L (ref 135–145)

## 2019-10-30 LAB — CBC
HCT: 32.9 % — ABNORMAL LOW (ref 39.0–52.0)
Hemoglobin: 10.1 g/dL — ABNORMAL LOW (ref 13.0–17.0)
MCH: 26 pg (ref 26.0–34.0)
MCHC: 30.7 g/dL (ref 30.0–36.0)
MCV: 84.6 fL (ref 80.0–100.0)
Platelets: 236 10*3/uL (ref 150–400)
RBC: 3.89 MIL/uL — ABNORMAL LOW (ref 4.22–5.81)
RDW: 15.2 % (ref 11.5–15.5)
WBC: 14.6 10*3/uL — ABNORMAL HIGH (ref 4.0–10.5)
nRBC: 0 % (ref 0.0–0.2)

## 2019-10-30 MED ORDER — LORAZEPAM 2 MG/ML IJ SOLN
1.0000 mg | INTRAMUSCULAR | Status: DC | PRN
  Administered 2019-10-30: 1 mg via INTRAVENOUS
  Filled 2019-10-30: qty 1

## 2019-10-30 NOTE — Progress Notes (Signed)
Podiatry progress note  Subjective: 44 year old diabetic male patient seen at bedside for palp evaluation of bilateral foot ulcers.  Patient denies any overnight events or any acute pedal complaints and reports that he did go for an MRI today and vascular test today.  Patient reports that his sister just left and has offered to call her and put her on speaker phone in order for the sister to hear the plan of care for his foot ulcers.   Current Facility-Administered Medications:  .  aspirin EC tablet 81 mg, 81 mg, Oral, Daily, Chotiner, Yevonne Aline, MD, 81 mg at 10/30/19 0833 .  atorvastatin (LIPITOR) tablet 40 mg, 40 mg, Oral, Daily, Chotiner, Yevonne Aline, MD, 40 mg at 10/30/19 0833 .  ceFAZolin (ANCEF) IVPB 2g/100 mL premix, 2 g, Intravenous, Q8H, Domenic Polite, MD, Stopped at 10/30/19 1435 .  dextrose 50 % solution 0-50 mL, 0-50 mL, Intravenous, PRN, Delo, Douglas, MD .  dorzolamide-timolol (COSOPT) 22.3-6.8 MG/ML ophthalmic solution 1 drop, 1 drop, Left Eye, BID, Chotiner, Yevonne Aline, MD, 1 drop at 10/30/19 0835 .  heparin injection 5,000 Units, 5,000 Units, Subcutaneous, Q8H, Chotiner, Yevonne Aline, MD, 5,000 Units at 10/30/19 1405 .  insulin aspart (novoLOG) injection 0-15 Units, 0-15 Units, Subcutaneous, TID WC, Domenic Polite, MD, 3 Units at 10/30/19 1756 .  insulin aspart (novoLOG) injection 0-5 Units, 0-5 Units, Subcutaneous, QHS, Domenic Polite, MD, 2 Units at 10/28/19 2139 .  insulin aspart (novoLOG) injection 3 Units, 3 Units, Subcutaneous, TID WC, Domenic Polite, MD, 3 Units at 10/30/19 1756 .  insulin glargine (LANTUS) injection 35 Units, 35 Units, Subcutaneous, Daily, Domenic Polite, MD, 35 Units at 10/30/19 [id number] .  insulin regular, human (MYXREDLIN) 100 units/ 100 mL infusion, , Intravenous, Continuous, Chotiner, Yevonne Aline, MD .  LORazepam (ATIVAN) injection 1 mg, 1 mg, Intravenous, Q30 min PRN, Domenic Polite, MD, 1 mg at 10/30/19 1219 .  mycophenolate (MYFORTIC) EC tablet 540 mg,  540 mg, Oral, BID, Domenic Polite, MD, 540 mg at 10/30/19 0626 .  ondansetron (ZOFRAN) injection 4 mg, 4 mg, Intravenous, Q6H PRN, Domenic Polite, MD .  tacrolimus (PROGRAF) capsule 6 mg, 6 mg, Oral, BID, Domenic Polite, MD, 6 mg at 10/30/19 9485  Allergies  Allergen Reactions  . Shellfish Allergy Anaphylaxis  . Shellfish-Derived Products Anaphylaxis  . Sulfa Antibiotics Anaphylaxis    Other reaction(s): Other (See Comments)  . Iodinated Diagnostic Agents Other (See Comments)    Other reaction(s): Other (see comments)  . Iodine Other (See Comments)    Other reaction(s): Other (see comments)     Objective: Vitals:   10/30/19 0015 10/30/19 0225 10/30/19 0818 10/30/19 1643  BP: 140/76 135/86 132/71 109/60  Pulse: 95 96 97 92  Resp: 18 18  18   Temp: 99 F (37.2 C) 98.4 F (36.9 C) 98.9 F (37.2 C) 98.2 F (36.8 C)  TempSrc: Oral Oral Oral Oral  SpO2: 95% 95% 97% 92%  Weight:  94 kg    Height:       General no acute distress Focused lower extremity exam Dry dressings clean and intact with no bloody strikethrough continue with odor present at the left foot.  Decreased pain with palpation bilateral.  Assessment and plan: Problem List Items Addressed This Visit      Endocrine   * (Principal) DKA (diabetic ketoacidosis) ([place]) - Primary   Relevant Medications   aspirin EC tablet 81 mg   atorvastatin (LIPITOR) tablet 40 mg   insulin regular, human (MYXREDLIN) 100 units/ 100 mL  infusion   insulin aspart (novoLOG) injection 0-5 Units   insulin aspart (novoLOG) injection 0-15 Units   insulin glargine (LANTUS) injection 35 Units   insulin aspart (novoLOG) injection 3 Units     Other   Sepsis ([facility])   Relevant Orders   DG Chest 1 View (Completed)   DG Foot 2 Views Right (Completed)    Other Visit Diagnoses    Ulcerated, foot ([place])       Relevant Orders   DG Foot 2 Views Left (Completed)     -Patient seen and evaluated -Discussed results of vascular testing/ABIs with  patient, there is evidence of diminished pedal circulation and decreased ABIs bilateral at this time recommend a referral to vascular for further evaluation advised patient that this referral is important because he would benefit from a surgical debridement and application of graft bilateral however if he does not have proper circulation to heal this procedure may not be beneficial.  I need vascular's opinion on patient's ability to heal before we can attempt to proceed with debridement or grafting procedure of bilateral foot wounds; patient and sister expressed understanding of my recommendations -Discussed MRI findings with patient of the left foot and again thinks the patient will benefit from a surgical debridement with biopsy and placement of graft bilateral however at this time cannot do any type of debridement procedures until patient has been seen by vascular therefore at this time recommend a vascular consult -Continue with antibiotics and medical management -Podiatry to follow  Dr. Cannon Kettle [facility] [phone] office [phone] cell

## 2019-10-30 NOTE — Consult Note (Signed)
[facility] Nurse Consult Note: Patient receiving care in [place].  Consult completed after review of record.  Podiatrist Landis Martins assessed the patient and made a note on 10/29/19.  Podiatry is directing care of the wounds. Reason for Consult: "diabetic ulcers to bilateral LE" Wound type: Pressure Injury POA: Yes/No/NA Measurement: Wound bed: Drainage (amount, consistency, odor)  Periwound: Dressing procedure/placement/frequency: Please follow orders provided by Podiatry services.  Podiatry services is directing care for this patient's wounds. Val Riles, RN, MSN, CWOCN, CNS-BC, pager [phone]

## 2019-10-30 NOTE — TOC Progression Note (Signed)
Transition of Care [facility]) - Progression Note    Patient Details  Name: ERYK BEAVERS MRN: [id number] Date of Birth: [birth year]  Transition of Care [facility]) CM/SW [place], Nadine Phone Number: 10/30/2019, 3:46 PM  Clinical Narrative:     CSW spoke with patients sister Felicia at bedside. Patients sister confirmed discharge plan that patient will go home with sister Alyse Low in [place] who will be able to provide care for the patient. Patient will also follow up with PCP for possible short term rehab or home health. Patients sister Solmon Ice also asked CSW to speak with casemanager about home health services. CSW will reach out to Case manager to follow up with patients sister with additional questions about home health services.  CSW will continue to follow.   Expected Discharge Plan: Home/Self Care Barriers to Discharge: Continued Medical Work up  Expected Discharge Plan and Services Expected Discharge Plan: Home/Self Care       Living arrangements for the past 2 months: Single Family Home                                       Social Determinants of Health (SDOH) Interventions    Readmission Risk Interventions No flowsheet data found.

## 2019-10-30 NOTE — Progress Notes (Signed)
Per patient he is claustrophobic.  RN asked patient if patient would attempt MRI if RN called on call MD to obtain order for medication that could help patient relax.  Patient stated they tried that at [facility] without success.  Patient refusing MRI at this time.

## 2019-10-30 NOTE — Progress Notes (Signed)
Pt is refusing exam says he is too claustrophobic and not willing to even give it a try. RN [place] says she will talk to Dr. in the morning and will have day shift follow up later in the day.

## 2019-10-30 NOTE — Progress Notes (Signed)
PROGRESS NOTE    Anthony Blanchard  FUX:[id number] DOB: Feb 17, 1976 DOA: 10/28/2019 PCP: Pleas Koch, NP  Brief Narrative: Anthony Blanchard is a 44 year old male with history of diabetes, ESRD status post kidney transplant in 2016 at [facility],  legally blind, CKD with baseline creatinine around 1.5-1.7, GERD, CAD (status post PCI to RCA in 2009 and PCI to LAD in 2013)who presented to the ED after a fall with abrasions to her shoulder, laceration to his lip and abrasion to his forehead.  He complained of generalized weakness, feeling disoriented,, fall, denies loss of consciousness.  Admits to having high blood sugars in the last few weeks but not checking his blood sugars regularly and admits poor compliance with insulin.  He also has an ulcer on his right foot which reportedly has been going on for 6 months to a year.  In the emergency room noted to have blood glucose of 1150 with high glucose and ketones in his urine, pH was 7.32 on ABG, Henry count greater than 20,000, creatinine was 2.83. -Admitted with DKA, acute kidney injury and sepsis due to foot ulcers  Assessment & Plan:   DKA (diabetic ketoacidosis) ([place]) Uncontrolled type 2 diabetes mellitus with hyperglycemia -Secondary to poor compliance with insulin and foot infection -Clinically improving, weaned off insulin drip 7/12 -CBGs poorly controlled, but improving on increased dose of Lantus and meal coverage NovoLog -Hemoglobin A1c is 12.1 indicating long-term poor control -Diabetes coordinator consulted -Patient will definitely need increased family supervision or SNF at discharge  Sepsis Diabetic foot ulcer  bilaterally Strep agalactiae bacteremia in 1 out of 2 blood cultures -Antibiotics changed to IV Ancef -X-rays of both feet indicating some concerns for osteomyelitis -Patient was due to see Dr. March Rummage with [facility] for his foot later this week, appreciate podiatry consult , MRI and ABIs ordered , unfortunately patient was  too anxious to undergo MRI yesterday, willing to try this with sedation today after long discussion  -Will repeat blood cultures  Acute kidney injury Stage III chronic kidney disease Status post renal transplant Hyperkalemia -Baseline creatinine is 1.5-1.7, creatinine on admission is 2.8, improving, closer to baseline now, continue Myfortic and Prograf -Hyperkalemia has resolved  Encephalopathy -metabolic encephalopathy in the setting of DKA, AKI and sepsis -Mental status improved  Legally blind  DVT prophylaxis: Heparin for DVT prophylaxis Code Status:Full code Family communication: Discussed with patient, no family at bedside, left msg for girlfriend Tomeka yesterday  Disposition Plan:  Status is: Inpatient  Remains inpatient appropriate because:Inpatient level of care appropriate due to severity of illness  Dispo: The patient is from: Home              Anticipated d/c is to: To be determined              Anticipated d/c date is: 2- 3 days              Patient currently is not medically stable to d/c.  Consultants:   Podiatry   Procedures:   Antimicrobials:    Subjective: -Declined MRI last night, reports being claustrophobic, overall feels better today, no nausea vomiting, was able to tolerate diet  Objective: Vitals:   10/29/19 1928 10/30/19 0015 10/30/19 0225 10/30/19 0818  BP:  140/76 135/86 132/71  Pulse:  95 96 97  Resp: 18 18 18    Temp:  99 F (37.2 C) 98.4 F (36.9 C) 98.9 F (37.2 C)  TempSrc:  Oral Oral Oral  SpO2:  95%  95% 97%  Weight:   94 kg   Height:        Intake/Output Summary (Last 24 hours) at 10/30/2019 1509 Last data filed at 10/30/2019 0800 Gross per 24 hour  Intake 1710.53 ml  Output 800 ml  Net 910.53 ml   Filed Weights   10/28/19 0148 10/30/19 0225  Weight: 99.8 kg 94 kg    Examination:  General exam: Chronically ill male appears older than stated age, awake alert oriented to self, place and partly  to time HEENT:  right eyelid is closed, no JVD CVS: S1-S2, regular rate rhythm Lungs: Clear bilaterally Abdomen: Soft, mildly distended, bowel sounds present, nontender Extremities : Ulcers on both feet, right lateral midfoot and left dorsal foot, positive dorsalis pedis pulses bilaterally  Skin: As above Psychiatry:  Mood & affect appropriate.     Data Reviewed:   CBC: Recent Labs  Lab 10/28/19 0237 10/28/19 0512 10/29/19 0416 10/30/19 0429  WBC 20.3*  --  17.6* 14.6*  HGB 11.9* 11.9* 10.6* 10.1*  HCT 43.5 35.0* 35.9* 32.9*  MCV 92.2  --  84.9 84.6  PLT 316  --  285 494   Basic Metabolic Panel: Recent Labs  Lab 10/28/19 0237 10/28/19 0237 10/28/19 0512 10/28/19 1353 10/29/19 0209 10/29/19 0416 10/30/19 0429  NA 130*   < > 130* 145 142 141 135  K 6.4*   < > 5.4* 4.2 4.4 4.6 4.0  CL 91*  --   --  110 112* 109 104  CO2 14*  --   --  22 20* 20* 23  GLUCOSE 1,189*  --   --  280* 347* 362* 163*  BUN 47*  --   --  40* 32* 32* 21*  CREATININE 2.83*  --   --  2.02* 1.67* 1.66* 1.32*  CALCIUM 10.3  --   --  10.4* 10.1 10.3 9.4   < > = values in this interval not displayed.   GFR: Estimated Creatinine Clearance: 81.6 mL/min (A) (by C-G formula based on SCr of 1.32 mg/dL (H)). Liver Function Tests: Recent Labs  Lab 10/28/19 0237  AST 14*  ALT 16  ALKPHOS 93  BILITOT 1.6*  PROT 8.1  ALBUMIN 2.8*   No results for input(s): LIPASE, AMYLASE in the last 168 hours. No results for input(s): AMMONIA in the last 168 hours. Coagulation Profile: Recent Labs  Lab 10/28/19 1259  INR 1.3*   Cardiac Enzymes: No results for input(s): CKTOTAL, CKMB, CKMBINDEX, TROPONINI in the last 168 hours. BNP (last 3 results) No results for input(s): PROBNP in the last 8760 hours. HbA1C: Recent Labs    10/28/19 0840  HGBA1C 12.1*   CBG: Recent Labs  Lab 10/29/19 1115 10/29/19 1648 10/29/19 2040 10/30/19 0746 10/30/19 1207  GLUCAP 215* 203* 186* 180* 166*   Lipid  Profile: No results for input(s): CHOL, HDL, LDLCALC, TRIG, CHOLHDL, LDLDIRECT in the last 72 hours. Thyroid Function Tests: No results for input(s): TSH, T4TOTAL, FREET4, T3FREE, THYROIDAB in the last 72 hours. Anemia Panel: No results for input(s): VITAMINB12, FOLATE, FERRITIN, TIBC, IRON, RETICCTPCT in the last 72 hours. Urine analysis:    Component Value Date/Time   COLORURINE STRAW (A) 10/28/2019 0509   APPEARANCEUR CLEAR 10/28/2019 0509   LABSPEC 1.026 10/28/2019 0509   PHURINE 5.0 10/28/2019 0509   GLUCOSEU >=500 (A) 10/28/2019 0509   HGBUR MODERATE (A) 10/28/2019 0509   BILIRUBINUR NEGATIVE 10/28/2019 0509   KETONESUR 20 (A) 10/28/2019 0509   PROTEINUR NEGATIVE 10/28/2019  9622   NITRITE NEGATIVE 10/28/2019 0509   LEUKOCYTESUR NEGATIVE 10/28/2019 0509   Sepsis Labs: @LABRCNTIP (procalcitonin:4,lacticidven:4)  ) Recent Results (from the past 240 hour(s))  Blood culture (routine x 2)     Status: None (Preliminary result)   Collection Time: 10/28/19  4:55 AM   Specimen: BLOOD RIGHT ARM  Result Value Ref Range Status   Specimen Description BLOOD RIGHT ARM  Final   Special Requests   Final    BOTTLES DRAWN AEROBIC AND ANAEROBIC Blood Culture adequate volume   Culture   Final    NO GROWTH 2 DAYS Performed at [facility], [place] [address] [place], [place] [address]    Report Status PENDING  Incomplete  Blood culture (routine x 2)     Status: Abnormal (Preliminary result)   Collection Time: 10/28/19  5:02 AM   Specimen: BLOOD LEFT ARM  Result Value Ref Range Status   Specimen Description BLOOD LEFT ARM  Final   Special Requests   Final    BOTTLES DRAWN AEROBIC AND ANAEROBIC Blood Culture adequate volume   Culture  Setup Time   Final    GRAM POSITIVE COCCI IN CHAINS AEROBIC BOTTLE ONLY CRITICAL RESULT CALLED TO, READ BACK BY AND VERIFIED WITH: PHARMD LAURA SEAY AT2252 BY MESSAN H. ON 10/28/2019    Culture (A)  Final    GROUP B  STREP(S.AGALACTIAE)ISOLATED SUSCEPTIBILITIES TO FOLLOW Performed at [facility], [place] [address] [place], [place] [address]    Report Status PENDING  Incomplete  Blood Culture ID Panel (Reflexed)     Status: Abnormal   Collection Time: 10/28/19  5:02 AM  Result Value Ref Range Status   Enterococcus species NOT DETECTED NOT DETECTED Final   Listeria monocytogenes NOT DETECTED NOT DETECTED Final   Staphylococcus species NOT DETECTED NOT DETECTED Final   Staphylococcus aureus (BCID) NOT DETECTED NOT DETECTED Final   Streptococcus species DETECTED (A) NOT DETECTED Final    Comment: CRITICAL RESULT CALLED TO, READ BACK BY AND VERIFIED WITH: PHARMD LAURA SEAY AT 2252 BY MESSAN H. ON  10/28/2019    Streptococcus agalactiae DETECTED (A) NOT DETECTED Final    Comment: CRITICAL RESULT CALLED TO, READ BACK BY AND VERIFIED WITH: PHARMD LAURA SEAY AT 2252 BY MESSAN H. ON  10/28/2019    Streptococcus pneumoniae NOT DETECTED NOT DETECTED Final   Streptococcus pyogenes NOT DETECTED NOT DETECTED Final   Acinetobacter baumannii NOT DETECTED NOT DETECTED Final   Enterobacteriaceae species NOT DETECTED NOT DETECTED Final   Enterobacter cloacae complex NOT DETECTED NOT DETECTED Final   Escherichia coli NOT DETECTED NOT DETECTED Final   Klebsiella oxytoca NOT DETECTED NOT DETECTED Final   Klebsiella pneumoniae NOT DETECTED NOT DETECTED Final   Proteus species NOT DETECTED NOT DETECTED Final   Serratia marcescens NOT DETECTED NOT DETECTED Final   Haemophilus influenzae NOT DETECTED NOT DETECTED Final   Neisseria meningitidis NOT DETECTED NOT DETECTED Final   Pseudomonas aeruginosa NOT DETECTED NOT DETECTED Final   Candida albicans NOT DETECTED NOT DETECTED Final   Candida glabrata NOT DETECTED NOT DETECTED Final   Candida krusei NOT DETECTED NOT DETECTED Final   Candida parapsilosis NOT DETECTED NOT DETECTED Final   Candida tropicalis NOT DETECTED NOT DETECTED Final    Comment: Performed at [facility], [address] [place], [place] [address]  Urine culture     Status: Abnormal   Collection Time: 10/28/19  5:09 AM   Specimen: Urine, Random  Result  Value Ref Range Status   Specimen Description URINE, RANDOM  Final   Special Requests NONE  Final   Culture (A)  Final    <10,000 COLONIES/mL INSIGNIFICANT GROWTH Performed at [facility], [place] [address], [place], [place] [address]    Report Status 10/29/2019 FINAL  Final  SARS Coronavirus 2 by RT PCR (hospital order, performed in [facility] hospital lab) Nasopharyngeal Nasopharyngeal Swab     Status: None   Collection Time: 10/28/19  7:37 AM   Specimen: Nasopharyngeal Swab  Result Value Ref Range Status   SARS Coronavirus 2 NEGATIVE NEGATIVE Final    Comment: (NOTE) SARS-CoV-2 target nucleic acids are NOT DETECTED.  The SARS-CoV-2 RNA is generally detectable in upper and lower respiratory specimens during the acute phase of infection. The lowest concentration of SARS-CoV-2 viral copies this assay can detect is 250 copies / mL. A negative result does not preclude SARS-CoV-2 infection and should not be used as the sole basis for treatment or other patient management decisions.  A negative result may occur with improper specimen collection / handling, submission of specimen other than nasopharyngeal swab, presence of viral mutation(s) within the areas targeted by this assay, and inadequate number of viral copies (<250 copies / mL). A negative result must be combined with clinical observations, patient history, and epidemiological information.  Fact Sheet for Patients:   StrictlyIdeas.no  Fact Sheet for Healthcare Providers: BankingDealers.co.za  This test is not yet approved or  cleared by the [place] FDA and has been authorized for detection and/or diagnosis of SARS-CoV-2 by FDA under an Emergency Use Authorization (EUA).  This EUA will remain in effect  (meaning this test can be used) for the duration of the COVID-19 declaration under Section 564(b)(1) of the Act, 21 U.S.C. section 360bbb-3(b)(1), unless the authorization is terminated or revoked sooner.  Performed at [facility], [place] [address] [place], [place] [address]          Radiology Studies: DG Foot 2 Views Left  Result Date: 10/29/2019 CLINICAL DATA:  Pain and open sores to both feet.  Duration unknown. EXAM: LEFT FOOT - 2 VIEW COMPARISON:  None. FINDINGS: Soft tissue thinning and soft tissue air adjacent to the proximal fifth metatarsal. Decreased bone density of the subjacent proximal metatarsal suspicious for osteomyelitis with decreased density of the cortex. No other findings suspicious for osteomyelitis. No fracture. Soft tissue air adjacent to the fifth proximal metatarsal. No radiopaque foreign body. Advanced vascular calcifications. IMPRESSION: Findings suspicious for osteomyelitis of the proximal fifth metatarsal. Adjacent soft tissue air. No radiopaque foreign body. Electronically Signed   By: Keith Rake M.D.   On: 10/29/2019 19:11   DG Foot 2 Views Right  Result Date: 10/29/2019 CLINICAL DATA:  Pain and open sores to both feet.  Duration unknown. EXAM: RIGHT FOOT - 2 VIEW COMPARISON:  Hit foot radiograph 11/15/2018 FINDINGS: Decreased bone mineral density with bony destruction involving the distal fifth metatarsal. There is adjacent soft tissue edema and dressing in place. Probable soft tissue ulcer. Minimal decreased density of fifth toe proximal phalanx. No other evidence of osteomyelitis of the foot. No radiopaque foreign body. There are vascular calcifications. IMPRESSION: 1. Findings consistent with osteomyelitis of the distal fifth metatarsal. 2. Possible/osteomyelitis of the fifth toe proximal phalanx. Suspected plantar lateral foot ulcer at the site of osteomyelitis. No radiopaque foreign body. Electronically Signed   By: Keith Rake M.D.   On:  10/29/2019 19:09   Scheduled Meds: .  aspirin EC  81 mg Oral Daily  . atorvastatin  40 mg Oral Daily  . dorzolamide-timolol  1 drop Left Eye BID  . heparin  5,000 Units Subcutaneous Q8H  . insulin aspart  0-15 Units Subcutaneous TID WC  . insulin aspart  0-5 Units Subcutaneous QHS  . insulin aspart  3 Units Subcutaneous TID WC  . insulin glargine  35 Units Subcutaneous Daily  . mycophenolate  540 mg Oral BID  . tacrolimus  6 mg Oral BID   Continuous Infusions: .  ceFAZolin (ANCEF) IV 2 g (10/30/19 1404)  . insulin       LOS: 2 days    Time spent: 62min  Domenic Polite, MD [facility] 10/30/2019, 3:09 PM

## 2019-10-30 NOTE — TOC Progression Note (Signed)
Transition of Care [facility]) - Progression Note    Patient Details  Name: Anthony Blanchard MRN: [id number] Date of Birth: 12-05-1975  [facility] of [facility]) CM/SW Contact  Graves-Bigelow, Ocie Cornfield, RN Phone Number: 10/30/2019, 5:20 PM  Clinical Narrative: Case Manager spoke with patient and sister Anthony Blanchard at the bedside. Family is undecided regarding home vs SNF. Case Manager discussed both options- home health compare list for home health services and Skilled Nursing Facilities provided to the patient and sister. Family to look over both and make a decision regarding plan of care for disposition. If patient goes home- he will have asssitance from his sister Anthony Blanchard in [place]. Case Manager will continue to follow for additional transition of care needs.     Expected Discharge Plan: [place] Barriers to Discharge: Continued Medical Work up  Expected Discharge Plan and Services Expected Discharge Plan: [place] arrangements for the past 2 months: Single Family Home                  Readmission Risk Interventions No flowsheet data found.

## 2019-10-30 NOTE — Progress Notes (Signed)
ABI has been completed.   Preliminary results in CV Proc.   Anthony Blanchard 10/30/2019 3:50 PM

## 2019-10-31 ENCOUNTER — Other Ambulatory Visit: Payer: Self-pay

## 2019-10-31 ENCOUNTER — Encounter (HOSPITAL_COMMUNITY): Payer: Self-pay | Admitting: Family Medicine

## 2019-10-31 DIAGNOSIS — E1110 Type 2 diabetes mellitus with ketoacidosis without coma: Secondary | ICD-10-CM | POA: Diagnosis not present

## 2019-10-31 DIAGNOSIS — I70243 Atherosclerosis of native arteries of left leg with ulceration of ankle: Secondary | ICD-10-CM

## 2019-10-31 DIAGNOSIS — I70233 Atherosclerosis of native arteries of right leg with ulceration of ankle: Secondary | ICD-10-CM

## 2019-10-31 DIAGNOSIS — E119 Type 2 diabetes mellitus without complications: Secondary | ICD-10-CM

## 2019-10-31 LAB — CBC
HCT: 32.5 % — ABNORMAL LOW (ref 39.0–52.0)
Hemoglobin: 9.7 g/dL — ABNORMAL LOW (ref 13.0–17.0)
MCH: 25.5 pg — ABNORMAL LOW (ref 26.0–34.0)
MCHC: 29.8 g/dL — ABNORMAL LOW (ref 30.0–36.0)
MCV: 85.3 fL (ref 80.0–100.0)
Platelets: 190 10*3/uL (ref 150–400)
RBC: 3.81 MIL/uL — ABNORMAL LOW (ref 4.22–5.81)
RDW: 15.4 % (ref 11.5–15.5)
WBC: 12.7 10*3/uL — ABNORMAL HIGH (ref 4.0–10.5)
nRBC: 0 % (ref 0.0–0.2)

## 2019-10-31 LAB — BASIC METABOLIC PANEL
Anion gap: 11 (ref 5–15)
BUN: 22 mg/dL — ABNORMAL HIGH (ref 6–20)
CO2: 21 mmol/L — ABNORMAL LOW (ref 22–32)
Calcium: 9 mg/dL (ref 8.9–10.3)
Chloride: 100 mmol/L (ref 98–111)
Creatinine, Ser: 1.37 mg/dL — ABNORMAL HIGH (ref 0.61–1.24)
GFR calc Af Amer: >60 mL/min (ref >60)
GFR calc non Af Amer: >60 mL/min (ref >60)
Glucose, Bld: 174 mg/dL — ABNORMAL HIGH (ref 70–99)
Potassium: 3.8 mmol/L (ref 3.5–5.1)
Sodium: 132 mmol/L — ABNORMAL LOW (ref 135–145)

## 2019-10-31 LAB — CULTURE, BLOOD (ROUTINE X 2): Special Requests: ADEQUATE

## 2019-10-31 LAB — GLUCOSE, CAPILLARY
Glucose-Capillary: 170 mg/dL — ABNORMAL HIGH (ref 70–99)
Glucose-Capillary: 208 mg/dL — ABNORMAL HIGH (ref 70–99)
Glucose-Capillary: 119 mg/dL — ABNORMAL HIGH (ref 70–99)
Glucose-Capillary: 178 mg/dL — ABNORMAL HIGH (ref 70–99)

## 2019-10-31 NOTE — Progress Notes (Signed)
Physical Therapy Treatment Patient Details Name: Anthony Blanchard MRN: [id number] DOB: [birth year] Today's Date: 10/31/2019    History of Present Illness 44 y.o. 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 In ED found to have CBG of >1150, ABG ph 7.32, tachycardia and sepsis. Admitted 10/28/19 for treatement of DKA, sepsis possibly due to R foot ulcer and encephalopathy.     PT Comments    Pt sitting up on EoB on entry, much more alert and aware today with therapy. Pt limited in safe mobility by decreased vision and bilateral foot diabetic foot ulcers, in presence of decreased strength and balance. Pt is currently min guard for transfers and modA progressing to min A for ambulation with 1 person HHA. Discussed with pt possibility of discharging to sisters home where 24 hour assist can be provided and pt is in agreement. Given increased support PT changing discharge recommendation to HHPT. PT will continue to follow acutely.     Follow Up Recommendations  Supervision/Assistance - 24 hour;Home health PT           Precautions / Restrictions Precautions Precautions: Fall Precaution Comments: admitted after a fall Restrictions Weight Bearing Restrictions: No    Mobility  Bed Mobility               General bed mobility comments: sitting EoB on entry   Transfers Overall transfer level: Needs assistance Equipment used: 1 person hand held assist Transfers: Sit to/from Stand Sit to Stand: Min guard         General transfer comment: min guard for safety, good power up, utilizes LE support on side of bed to self steady, no outside support needed  Ambulation/Gait Ambulation/Gait assistance: Min assist;Mod assist Gait Distance (Feet): 80  Feet Assistive device: 1 person hand held assist Gait Pattern/deviations: Step-to pattern;Decreased weight shift to right;Decreased stance time - right Gait velocity: slowed Gait velocity interpretation: <1.8 ft/sec, indicate of risk for recurrent falls General Gait Details: mod A progressing to min A for steadying and guidance due to limited vision, pt with increase in pain with weightbearing      Balance Overall balance assessment: Needs assistance Sitting-balance support: Feet supported;No upper extremity supported Sitting balance-Leahy Scale: Fair     Standing balance support: Bilateral upper extremity supported;Single extremity supported Standing balance-Leahy Scale: Poor Standing balance comment: requires at least single UE support to steady                            Cognition Arousal/Alertness: Awake/alert Behavior During Therapy: WFL for tasks assessed/performed Overall Cognitive Status: No family/caregiver present to determine baseline cognitive functioning Area of Impairment: Attention;Memory;Following commands;Safety/judgement;Awareness;Problem solving;Orientation                   Current Attention Level: Alternating   Following Commands: Follows multi-step commands with increased time Safety/Judgement: Decreased awareness of safety;Decreased awareness of deficits Awareness: Emergent Problem Solving: Requires verbal cues;Requires tactile cues General Comments: pt with increased cognition today, aware of situation and need to move in with sister to assist in management of his health conditions         General Comments General comments (skin integrity, edema, etc.): VSS on RA with ambulation, pt reports increased perception of light with ambulation out into hallway  Pertinent Vitals/Pain Pain Assessment: 0-10 Pain Score: 7  Pain Location: bilateral feet with weight bearing due to diabetic ulcers  Pain Descriptors / Indicators:  Aching;Grimacing;Guarding;Sore Pain Intervention(s): Limited activity within patient's tolerance;Monitored during session;Repositioned           PT Goals (current goals can now be found in the care plan section) Acute Rehab PT Goals Patient Stated Goal: go home PT Goal Formulation: With patient Time For Goal Achievement: 11/12/19 Potential to Achieve Goals: Fair Progress towards PT goals: Progressing toward goals    Frequency    Min 3X/week      PT Plan Discharge plan needs to be updated;Frequency needs to be updated       AM-PAC PT "6 Clicks" Mobility   Outcome Measure  Help needed turning from your back to your side while in a flat bed without using bedrails?: None Help needed moving from lying on your back to sitting on the side of a flat bed without using bedrails?: A Little Help needed moving to and from a bed to a chair (including a wheelchair)?: A Little Help needed standing up from a chair using your arms (e.g., wheelchair or bedside chair)?: A Little Help needed to walk in hospital room?: A Lot Help needed climbing 3-5 steps with a railing? : A Lot 6 Click Score: 17    End of Session Equipment Utilized During Treatment: Gait belt Activity Tolerance: Patient limited by pain Patient left: with call bell/phone within reach;in chair;with chair alarm set;with nursing/sitter in room Nurse Communication: Mobility status PT Visit Diagnosis: Unsteadiness on feet (R26.81);Other abnormalities of gait and mobility (R26.89);History of falling (Z91.81);Muscle weakness (generalized) (M62.81);Difficulty in walking, not elsewhere classified (R26.2);Adult, failure to thrive (R62.7);Pain Pain - Right/Left:  (bilateral feet) Pain - part of body: Ankle and joints of foot     Time: 8341-9622 PT Time Calculation (min) (ACUTE ONLY): 21 min  Charges:  $Gait Training: 8-22 mins                     Dietrich Ke B. Migdalia Dk PT, DPT Acute Rehabilitation Services Pager [phone] Office [phone]    [employer] 10/31/2019, 10:02 AM

## 2019-10-31 NOTE — Progress Notes (Signed)
PROGRESS NOTE    Anthony Blanchard  YQI:[id number] DOB: [birth year] DOA: 10/28/2019 PCP: Pleas Koch, NP   Brief Narrative: Patient is a 44 year old male with history of diabetes type 2, ESRD status post kidney transplant 2016 at [facility], legally blind, CKD with baseline creatinine around 1.5-1.7, GERD, coronary artery disease who presented to the emergency department after a fall with abrasion to the shoulder, laceration of the lip and abrasion to the forehead.  He was complaining of generalized weakness, feeling disoriented, no loss of consciousness though.  He has poor compliance with insulin.  Patient also has ulcers on his bilateral feet.  On presentation he was found to have severe hyperglycemia with ketones in the urine.  He was admitted for the management of DKA, acute kidney injury and sepsis due to foot ulcers.  Podiatry, vascular surgery following.  Assessment & Plan:   Principal Problem:   DKA (diabetic ketoacidosis) ([place]) Active Problems:   Kidney transplanted   Hyperlipidemia   Sepsis ([place])   Essential hypertension   Hyperkalemia   CKD (chronic kidney disease) stage 4, GFR 15-29 ml/min (HCC)   Diabetic foot ulcer ([place])   DKA (diabetic ketoacidoses) ([place])  DKA: Secondary to poor compliance with insulin and concurrent foot infection.  Insulin drip was stopped and now on Lantus and sliding scale.  Hemoglobin A1c of 12.1.  Diabetic monitor following.  Sepsis/bilateral diabetic foot ulcers: Has chronic bilateral foot ulcers.  ABI suggested diminished circulation in the lower extremities.  Vascular surgery consulted.  Continue antibiotic Ancef.  X-rays of the both feet indicating some concern for osteomyelitis.  MRI is suspicious of   early osteomyelitis of the lateral plantar base of the fifth metatarsal .we will wait for vascular surgery recommendation.  Streptococcus agalactiae bacteremia: Seen on one of the 2 blood culture bottles.  Currently on Ancef.  Repeat blood cultures  have been sent and they are negative.  Will discuss with ID at some point regarding duration of antibiotics.  AKI on stage IIIb CKD/renal transplant/hyperkalemia: Baseline creatinine around 1.5-1.7.  Presented with creatinine of 2.8 now closer to baseline.  Continue Myfortic and Prograf.  Hyperkalemia has resolved.  Coronary artery disease: Patient is status post PCI to RCA in 2009 and PCI to LAD in 2013  Encephalopathy: Metabolic encephalopathy in the setting of DKA, AKI, sepsis.  Currently alert and oriented.  Generalized weakness/debility/deconditioning: PT consulted.  Recommended home health on discharge.         DVT prophylaxis:Heparin [place] Code Status: Full Family Communication: None present at bedside Status is: Inpatient  Remains inpatient appropriate because:IV treatments appropriate due to intensity of illness or inability to take PO   Dispo: The patient is from: Home              Anticipated d/c is to: Home              Anticipated d/c date is: 3 days              Patient currently is not medically stable to d/c.     Consultants: Vascular surgery, podiatry  Procedures: None  Antimicrobials:  Anti-infectives (From admission, onward)   Start     Dose/Rate Route Frequency Ordered Stop   10/29/19 1400  ceFAZolin (ANCEF) IVPB 2g/100 mL premix     Discontinue     2 g 200 mL/hr over 30 Minutes Intravenous Every 8 hours 10/29/19 0906     10/29/19 0700  vancomycin (VANCOCIN) IVPB 1000 mg/200 mL premix  Status:  Discontinued        1,000 mg 200 mL/hr over 60 Minutes Intravenous Every 24 hours 10/28/19 0625 10/28/19 0751   10/28/19 0815  metroNIDAZOLE (FLAGYL) IVPB 500 mg  Status:  Discontinued        500 mg 100 mL/hr over 60 Minutes Intravenous Every 8 hours 10/28/19 0814 10/28/19 1126   10/28/19 0700  ceFEPIme (MAXIPIME) 2 g in sodium chloride 0.9 % 100 mL IVPB  Status:  Discontinued        2 g 200 mL/hr over 30 Minutes Intravenous Every 24 hours 10/28/19 0625  10/29/19 0906   10/28/19 0630  vancomycin (VANCOCIN) IVPB 1000 mg/200 mL premix  Status:  Discontinued        1,000 mg 200 mL/hr over 60 Minutes Intravenous  Once 10/28/19 0620 10/28/19 0757   10/28/19 0415  vancomycin (VANCOCIN) IVPB 1000 mg/200 mL premix        1,000 mg 200 mL/hr over 60 Minutes Intravenous  Once 10/28/19 0408 10/28/19 0649   10/28/19 0415  piperacillin-tazobactam (ZOSYN) IVPB 3.375 g        3.375 g 12.5 mL/hr over 240 Minutes Intravenous  Once 10/28/19 0408 10/28/19 0804      Subjective: Patient seen and examined at the bedside this afternoon.  Hemodynamically stable.  Comfortable, lying in the bed.  Denies any new complaints.  Currently alert and oriented.  Objective: Vitals:   10/31/19 0009 10/31/19 0412 10/31/19 0730 10/31/19 1136  BP: 110/66 (!) 132/59 (!) 141/82 101/77  Pulse: 90 90 82 98  Resp: 18 18 18    Temp: 97.8 F (36.6 C) 99.5 F (37.5 C) 99.5 F (37.5 C) 98.5 F (36.9 C)  TempSrc: Oral Oral Oral Oral  SpO2: 98% 95% 95%   Weight:  94.6 kg    Height:        Intake/Output Summary (Last 24 hours) at 10/31/2019 1442 Last data filed at 10/31/2019 0900 Gross per 24 hour  Intake 652.67 ml  Output 675 ml  Net -22.33 ml   Filed Weights   10/28/19 0148 10/30/19 0225 10/31/19 0412  Weight: 99.8 kg 94 kg 94.6 kg    Examination:  General exam: Obese, comfortable HEENT: Absence of right eye, legally blind  respiratory system: Bilateral equal air entry, normal vesicular breath sounds, no wheezes or crackles  Cardiovascular system: S1 & S2 heard, RRR. No JVD, murmurs, rubs, gallops or clicks. No pedal edema. Gastrointestinal system: Abdomen is nondistended, soft and nontender. No organomegaly or masses felt. Normal bowel sounds heard. Central nervous system: Alert and oriented. No focal neurological deficits. Extremities: No edema, no clubbing ,no cyanosis Skin: Bilateral foot ulcers    Data Reviewed: I have personally reviewed following labs and  imaging studies  CBC: Recent Labs  Lab 10/28/19 0237 10/28/19 0512 10/29/19 0416 10/30/19 0429 10/31/19 0525  WBC 20.3*  --  17.6* 14.6* 12.7*  HGB 11.9* 11.9* 10.6* 10.1* 9.7*  HCT 43.5 35.0* 35.9* 32.9* 32.5*  MCV 92.2  --  84.9 84.6 85.3  PLT 316  --  285 236 681   Basic Metabolic Panel: Recent Labs  Lab 10/28/19 1353 10/29/19 0209 10/29/19 0416 10/30/19 0429 10/31/19 0525  NA 145 142 141 135 132*  K 4.2 4.4 4.6 4.0 3.8  CL 110 112* 109 104 100  CO2 22 20* 20* 23 21*  GLUCOSE 280* 347* 362* 163* 174*  BUN 40* 32* 32* 21* 22*  CREATININE 2.02* 1.67* 1.66* 1.32* 1.37*  CALCIUM 10.4* 10.1  10.3 9.4 9.0   GFR: Estimated Creatinine Clearance: 79 mL/min (A) (by C-G formula based on SCr of 1.37 mg/dL (H)). Liver Function Tests: Recent Labs  Lab 10/28/19 0237  AST 14*  ALT 16  ALKPHOS 93  BILITOT 1.6*  PROT 8.1  ALBUMIN 2.8*   No results for input(s): LIPASE, AMYLASE in the last 168 hours. No results for input(s): AMMONIA in the last 168 hours. Coagulation Profile: Recent Labs  Lab 10/28/19 1259  INR 1.3*   Cardiac Enzymes: No results for input(s): CKTOTAL, CKMB, CKMBINDEX, TROPONINI in the last 168 hours. BNP (last 3 results) No results for input(s): PROBNP in the last 8760 hours. HbA1C: No results for input(s): HGBA1C in the last 72 hours. CBG: Recent Labs  Lab 10/30/19 1207 10/30/19 1637 10/30/19 2057 10/31/19 0727 10/31/19 1134  GLUCAP 166* 177* 161* 170* 208*   Lipid Profile: No results for input(s): CHOL, HDL, LDLCALC, TRIG, CHOLHDL, LDLDIRECT in the last 72 hours. Thyroid Function Tests: No results for input(s): TSH, T4TOTAL, FREET4, T3FREE, THYROIDAB in the last 72 hours. Anemia Panel: No results for input(s): VITAMINB12, FOLATE, FERRITIN, TIBC, IRON, RETICCTPCT in the last 72 hours. Sepsis Labs: Recent Labs  Lab 10/28/19 0237 10/28/19 0453  LATICACIDVEN 3.2* 2.4*    Recent Results (from the past 240 hour(s))  Blood culture  (routine x 2)     Status: None (Preliminary result)   Collection Time: 10/28/19  4:55 AM   Specimen: BLOOD RIGHT ARM  Result Value Ref Range Status   Specimen Description BLOOD RIGHT ARM  Final   Special Requests   Final    BOTTLES DRAWN AEROBIC AND ANAEROBIC Blood Culture adequate volume   Culture   Final    NO GROWTH 3 DAYS Performed at [facility], [address] [place], [place] [address]    Report Status PENDING  Incomplete  Blood culture (routine x 2)     Status: Abnormal   Collection Time: 10/28/19  5:02 AM   Specimen: BLOOD LEFT ARM  Result Value Ref Range Status   Specimen Description BLOOD LEFT ARM  Final   Special Requests   Final    BOTTLES DRAWN AEROBIC AND ANAEROBIC Blood Culture adequate volume   Culture  Setup Time   Final    GRAM POSITIVE COCCI IN CHAINS AEROBIC BOTTLE ONLY CRITICAL RESULT CALLED TO, READ BACK BY AND VERIFIED WITH: PHARMD LAURA SEAY AT2252 BY MESSAN H. ON 10/28/2019 Performed at [facility], [place] [address] [place], [place] [address]    Culture GROUP B STREP(S.AGALACTIAE)ISOLATED (A)  Final   Report Status 10/31/2019 FINAL  Final   Organism ID, Bacteria GROUP B STREP(S.AGALACTIAE)ISOLATED  Final      Susceptibility   Group b strep(s.agalactiae)isolated - MIC*    CLINDAMYCIN <=0.25 SENSITIVE Sensitive     AMPICILLIN <=0.25 SENSITIVE Sensitive     ERYTHROMYCIN <=0.12 SENSITIVE Sensitive     VANCOMYCIN 0.5 SENSITIVE Sensitive     CEFTRIAXONE <=0.12 SENSITIVE Sensitive     LEVOFLOXACIN 1 SENSITIVE Sensitive     PENICILLIN Value in next row Sensitive      SENSITIVE<=0.06    * GROUP B STREP(S.AGALACTIAE)ISOLATED  Blood Culture ID Panel (Reflexed)     Status: Abnormal   Collection Time: 10/28/19  5:02 AM  Result Value Ref Range Status   Enterococcus species NOT DETECTED NOT DETECTED Final   Listeria monocytogenes NOT DETECTED NOT DETECTED Final   Staphylococcus species NOT DETECTED NOT DETECTED Final   Staphylococcus  aureus (BCID)  NOT DETECTED NOT DETECTED Final   Streptococcus species DETECTED (A) NOT DETECTED Final    Comment: CRITICAL RESULT CALLED TO, READ BACK BY AND VERIFIED WITH: PHARMD LAURA SEAY AT 2252 BY MESSAN H. ON  10/28/2019    Streptococcus agalactiae DETECTED (A) NOT DETECTED Final    Comment: CRITICAL RESULT CALLED TO, READ BACK BY AND VERIFIED WITH: PHARMD LAURA SEAY AT 2252 BY MESSAN H. ON  10/28/2019    Streptococcus pneumoniae NOT DETECTED NOT DETECTED Final   Streptococcus pyogenes NOT DETECTED NOT DETECTED Final   Acinetobacter baumannii NOT DETECTED NOT DETECTED Final   Enterobacteriaceae species NOT DETECTED NOT DETECTED Final   Enterobacter cloacae complex NOT DETECTED NOT DETECTED Final   Escherichia coli NOT DETECTED NOT DETECTED Final   Klebsiella oxytoca NOT DETECTED NOT DETECTED Final   Klebsiella pneumoniae NOT DETECTED NOT DETECTED Final   Proteus species NOT DETECTED NOT DETECTED Final   Serratia marcescens NOT DETECTED NOT DETECTED Final   Haemophilus influenzae NOT DETECTED NOT DETECTED Final   Neisseria meningitidis NOT DETECTED NOT DETECTED Final   Pseudomonas aeruginosa NOT DETECTED NOT DETECTED Final   Candida albicans NOT DETECTED NOT DETECTED Final   Candida glabrata NOT DETECTED NOT DETECTED Final   Candida krusei NOT DETECTED NOT DETECTED Final   Candida parapsilosis NOT DETECTED NOT DETECTED Final   Candida tropicalis NOT DETECTED NOT DETECTED Final    Comment: Performed at [facility], [place]. [address], [place], [place] [address]  Urine culture     Status: Abnormal   Collection Time: 10/28/19  5:09 AM   Specimen: Urine, Random  Result Value Ref Range Status   Specimen Description URINE, RANDOM  Final   Special Requests NONE  Final   Culture (A)  Final    <10,000 COLONIES/mL INSIGNIFICANT GROWTH Performed at [facility], [place] [address] [place], [place] [address]    Report Status 10/29/2019 FINAL  Final  SARS Coronavirus 2 by RT PCR (hospital order,  performed in [facility] hospital lab) Nasopharyngeal Nasopharyngeal Swab     Status: None   Collection Time: 10/28/19  7:37 AM   Specimen: Nasopharyngeal Swab  Result Value Ref Range Status   SARS Coronavirus 2 NEGATIVE NEGATIVE Final    Comment: (NOTE) SARS-CoV-2 target nucleic acids are NOT DETECTED.  The SARS-CoV-2 RNA is generally detectable in upper and lower respiratory specimens during the acute phase of infection. The lowest concentration of SARS-CoV-2 viral copies this assay can detect is 250 copies / mL. A negative result does not preclude SARS-CoV-2 infection and should not be used as the sole basis for treatment or other patient management decisions.  A negative result may occur with improper specimen collection / handling, submission of specimen other than nasopharyngeal swab, presence of viral mutation(s) within the areas targeted by this assay, and inadequate number of viral copies (<250 copies / mL). A negative result must be combined with clinical observations, patient history, and epidemiological information.  Fact Sheet for Patients:   StrictlyIdeas.no  Fact Sheet for Healthcare Providers: BankingDealers.co.za  This test is not yet approved or  cleared by the [place] FDA and has been authorized for detection and/or diagnosis of SARS-CoV-2 by FDA under an Emergency Use Authorization (EUA).  This EUA will remain in effect (meaning this test can be used) for the duration of the COVID-19 declaration under Section 564(b)(1) of the Act, 21 U.S.C. section 360bbb-3(b)(1), unless the authorization is terminated or revoked sooner.  Performed at [facility], [place] [address], [place], [place] [address]   Culture, blood (routine x 2)     Status: None (Preliminary result)   Collection Time: 10/30/19  4:53 PM   Specimen: BLOOD RIGHT HAND  Result Value Ref Range Status   Specimen Description BLOOD RIGHT HAND  Final    Special Requests   Final    BOTTLES DRAWN AEROBIC AND ANAEROBIC Blood Culture adequate volume   Culture   Final    NO GROWTH < 24 HOURS Performed at [facility], [place] [address] [place], [place] [address]    Report Status PENDING  Incomplete  Culture, blood (routine x 2)     Status: None (Preliminary result)   Collection Time: 10/30/19  5:01 PM   Specimen: BLOOD LEFT HAND  Result Value Ref Range Status   Specimen Description BLOOD LEFT HAND  Final   Special Requests   Final    BOTTLES DRAWN AEROBIC AND ANAEROBIC Blood Culture adequate volume   Culture   Final    NO GROWTH < 24 HOURS Performed at [facility], [place] [address], [place], [place] [address]    Report Status PENDING  Incomplete         Radiology Studies: MR FOOT LEFT WO CONTRAST  Result Date: 10/30/2019 CLINICAL DATA:  Fifth metatarsal ulcer and possible osteomyelitis EXAM: MRI OF THE LEFT FOOT WITHOUT CONTRAST TECHNIQUE: Multiplanar, multisequence MR imaging of the left was performed. No intravenous contrast was administered. COMPARISON:  October 29, 2019 radiograph FINDINGS: Bones/Joint/Cartilage There is minimally increased T2 hyperintense signal seen at the lateral base of the fifth metatarsal. There is possible subtle T1 hypointensity seen the plantar surface of the fifth metatarsal base, series 7, image 5. No osseous fracture, cortical destruction or periosteal reaction. There is an overlying soft tissue ulceration with subcutaneous emphysema. Normal osseous marrow signal seen throughout the remainder of the forefoot. The articular surfaces appear to be maintained. No large joint effusions. Ligaments The Lisfranc ligaments are intact. Muscles and Tendons Mild fatty atrophy noted within the musculature with diffusely increased feathery signal. The flexor and extensor tendons appear to be intact. Soft tissues Area of a superficial ulceration seen overlying the lateral base of the fifth metatarsal with  subcutaneous emphysema. Overlying skin thickening and subcutaneous edema is noted. No loculated fluid collections are noted. IMPRESSION: Findings which could be suggestive of early osteomyelitis of the lateral plantar base of the fifth metatarsal. No osseous fracture. Overlying superficial ulceration with subcutaneous emphysema and diffuse edema. No soft tissue abscess or sinus tract. Electronically Signed   By: Prudencio Pair M.D.   On: 10/30/2019 16:50   DG Foot 2 Views Left  Result Date: 10/29/2019 CLINICAL DATA:  Pain and open sores to both feet.  Duration unknown. EXAM: LEFT FOOT - 2 VIEW COMPARISON:  None. FINDINGS: Soft tissue thinning and soft tissue air adjacent to the proximal fifth metatarsal. Decreased bone density of the subjacent proximal metatarsal suspicious for osteomyelitis with decreased density of the cortex. No other findings suspicious for osteomyelitis. No fracture. Soft tissue air adjacent to the fifth proximal metatarsal. No radiopaque foreign body. Advanced vascular calcifications. IMPRESSION: Findings suspicious for osteomyelitis of the proximal fifth metatarsal. Adjacent soft tissue air. No radiopaque foreign body. Electronically Signed   By: Keith Rake M.D.   On: 10/29/2019 19:11   DG Foot 2 Views Right  Result Date: 10/29/2019 CLINICAL DATA:  Pain and open sores to both feet.  Duration unknown. EXAM: RIGHT FOOT - 2 VIEW COMPARISON:  Hit foot radiograph 11/15/2018 FINDINGS: Decreased bone mineral density with bony destruction involving the distal fifth metatarsal. There is adjacent soft tissue edema and dressing in place. Probable soft tissue ulcer. Minimal decreased density of fifth toe proximal phalanx. No other evidence of osteomyelitis of the foot. No radiopaque foreign body. There are vascular calcifications. IMPRESSION: 1. Findings consistent with osteomyelitis of the distal fifth metatarsal. 2. Possible/osteomyelitis of the fifth toe proximal phalanx. Suspected plantar  lateral foot ulcer at the site of osteomyelitis. No radiopaque foreign body. Electronically Signed   By: Keith Rake M.D.   On: 10/29/2019 19:09   VAS [place] ABI WITH/WO TBI  Result Date: 10/30/2019 LOWER EXTREMITY DOPPLER STUDY Indications: Ulceration. High Risk Factors: Hypertension, Diabetes, coronary artery disease.  Limitations: Today's exam was limited due to no prior. Comparison Study: no prior Performing Technologist: Abram Sander RVS  Examination Guidelines: A complete evaluation includes at minimum, Doppler waveform signals and systolic blood pressure reading at the level of bilateral brachial, anterior tibial, and posterior tibial arteries, when vessel segments are accessible. Bilateral testing is considered an integral part of a complete examination. Photoelectric Plethysmograph (PPG) waveforms and toe systolic pressure readings are included as required and additional duplex testing as needed. Limited examinations for reoccurring indications may be performed as noted.  ABI Findings: +---------+------------------+-----+----------+--------+ Right    Rt Pressure (mmHg)IndexWaveform  Comment  +---------+------------------+-----+----------+--------+ Brachial 127                    triphasic          +---------+------------------+-----+----------+--------+ PTA      82                0.59 biphasic           +---------+------------------+-----+----------+--------+ DP       98                0.70 monophasic         +---------+------------------+-----+----------+--------+ Great Toe78                0.56 Abnormal           +---------+------------------+-----+----------+--------+ +--------+------------------+-----+----------+-------+ Left    Lt Pressure (mmHg)IndexWaveform  Comment +--------+------------------+-----+----------+-------+ Brachial140                    triphasic         +--------+------------------+-----+----------+-------+ PTA     255                1.82 biphasic          +--------+------------------+-----+----------+-------+ DP      67                0.48 monophasic        +--------+------------------+-----+----------+-------+ +-------+-----------+-----------+------------+------------+ ABI/TBIToday's ABIToday's TBIPrevious ABIPrevious TBI +-------+-----------+-----------+------------+------------+ Right  0.70       0.56                                +-------+-----------+-----------+------------+------------+ Left   0.48                                           +-------+-----------+-----------+------------+------------+  Summary:  *See table(s) above for measurements and observations.  Electronically signed by Harold Barban MD on 10/30/2019 at 4:24:28  PM.   Final         Scheduled Meds: . aspirin EC  81 mg Oral Daily  . atorvastatin  40 mg Oral Daily  . dorzolamide-timolol  1 drop Left Eye BID  . heparin  5,000 Units Subcutaneous Q8H  . insulin aspart  0-15 Units Subcutaneous TID WC  . insulin aspart  0-5 Units Subcutaneous QHS  . insulin aspart  3 Units Subcutaneous TID WC  . insulin glargine  35 Units Subcutaneous Daily  . mycophenolate  540 mg Oral BID  . tacrolimus  6 mg Oral BID   Continuous Infusions: .  ceFAZolin (ANCEF) IV 2 g (10/31/19 1305)  . insulin       LOS: 3 days    Time spent: 25 mins,More than 50% of that time was spent in counseling and/or coordination of care.      Shelly Coss, MD [facility] P7/14/2021, 2:42 PM

## 2019-10-31 NOTE — TOC Progression Note (Signed)
Transition of Care [facility]) - Progression Note    Patient Details  Name: Anthony Blanchard MRN: [id number] Date of Birth: 12-25-1975  Transition of [facility] CM/SW Contact  Graves-Bigelow, Ocie Cornfield, RN Phone Number: 10/31/2019, 3:39 PM  Clinical Narrative:  Patient presented for DKA from home alone. Patient has support of sisters in [place] and [place]. Case Manager spoke with patient and family, and they are in aggreance for home with home health services. Patient will go to his sisters home in [place]. Family wants to use [facility]- referral made and start of care to begin within 24-48 hours post transition home. Case Manager will continue to follow for additional transition of care needs.   Expected Discharge Plan: [place] Barriers to Discharge: Continued Medical Work up  Expected Discharge Plan and Services Expected Discharge Plan: [place] In-house Referral: Clinical Social Work Discharge Planning Services: CM Consult Post Acute Care Choice: [place] arrangements for the past 2 months: Single Family Home                 DME Arranged: N/A   HH Arranged: RN, Disease Management, PT, OT, Nurse's Aide HH Agency: [place] Date [facility] Agency Contacted: 10/31/19 Time [place]: [place] Representative spoke with at [place]: [place]  Readmission Risk Interventions No flowsheet data found.

## 2019-10-31 NOTE — Consult Note (Addendum)
It is basically a toggle like this where it is on Hospital Consult    Reason for Consult:  Bilateral foot wounds Requesting Physician:  Dr. Cannon Kettle MRN #:  [id number]  History of Present Illness: This is a 44 y.o. male with a history of poorly controlled DM with subsequent diabetic retinopathy and nephropathy.  He presents with bilateral draining foot wounds and imaging suggestive of osteomyelitis involving both right and left fifth metatarsals. He states he ambulates with assistance due to being legally blind.  He also states he currently lives locally alone and has not been using his insulin properly.  He says he lived recently in [place] and had better support from "nursing friends".  He currently denies pain, fever or chills.  S/p right iliac fossa renal allograft 5 years ago. The pt is on a statin for cholesterol management.  The pt is on a daily aspirin.   Other AC:  none The pt is on BB for hypertension.   The pt is diabetic.  insulin Tobacco hx:  denies  Past Medical History:  Diagnosis Date  . Abnormal results of thyroid function studies 08/16/2011  . Chronic kidney disease   . Coronary artery disease   . Diabetes mellitus without complication ([place])   . GERD (gastroesophageal reflux disease)   . Glaucoma   . Hypertension   . Overweight   . Partial blindness     Past Surgical History:  Procedure Laterality Date  . CARDIAC CATHETERIZATION    . EYE SURGERY    . 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    Allergies  Allergen Reactions  . Shellfish Allergy Anaphylaxis  . Shellfish-Derived Products Anaphylaxis  . Sulfa Antibiotics Anaphylaxis    Other reaction(s): Other (See Comments)  . Iodinated Diagnostic Agents Other (See Comments)    Other reaction(s): Other (see comments)  . Iodine Other (See Comments)    Other reaction(s): Other (see comments)    Prior to Admission medications   Medication Sig Start Date End Date Taking? Authorizing Provider  acetaminophen (TYLENOL) 500  MG tablet Take 500 mg by mouth daily.   Yes [provider]  aspirin EC 81 MG tablet Take 81 mg by mouth. 11/18/14  Yes [provider]  dorzolamide-timolol (COSOPT) 22.3-6.8 MG/ML ophthalmic solution Place 1 drop into the left eye 2 (two) times daily.  11/06/18  Yes [provider]  insulin glargine (LANTUS) 100 UNIT/ML injection Inject 16 Units into the skin at bedtime.    Yes [provider]  metoprolol tartrate (LOPRESSOR) 50 MG tablet Take 75 mg by mouth 2 (two) times daily.  07/27/16 10/29/19 Yes [provider]  mycophenolate (MYFORTIC) 180 MG EC tablet Take 540 mg by mouth 2 (two) times daily. 10/09/19  Yes [provider]  NOVOLOG FLEXPEN 100 UNIT/ML FlexPen Inject 4-8 Units into the skin See admin instructions. 4 units at breakfast, 8 units at lunch and 6 units in the afternoon 05/05/17  Yes [provider]  omeprazole (PRILOSEC) 20 MG capsule Take 20 mg by mouth daily.  07/27/16 10/29/19 Yes [provider]  tacrolimus (PROGRAF) 1 MG capsule Take 6 mg by mouth in the morning and at bedtime.  10/09/19  Yes [provider]  atorvastatin (LIPITOR) 40 MG tablet Take 1 tablet (40 mg total) by mouth daily. 03/07/19 03/01/20  O'NealCassie Freer, MD  Blood Glucose Monitoring Suppl (FIFTY50 GLUCOSE METER 2.0) w/Device KIT Use as instructed 11/13/14   [provider]  silver sulfADIAZINE (SILVADENE) 1 % cream  Apply pea-sized amount to wound daily. 01/04/19   Evelina Bucy, DPM  [employer] MICRO PEN NEEDLES 32G X 4 MM MISC  12/01/18   [provider]    Social History   Socioeconomic History  . Marital status: Single    Spouse name: Not on file  . Number of children: Not on file  . Years of education: Not on file  . Highest education level: Not on file  Occupational History  . Not on file  Tobacco Use  . Smoking status: Never Smoker  . Smokeless tobacco: Never Used  Substance and Sexual Activity  .  Alcohol use: Not Currently  . Drug use: Never  . Sexual activity: Not on file  Other Topics Concern  . Not on file  Social History Narrative  . Not on file   Social Determinants of Health   Financial Resource Strain:   . Difficulty of Paying Living Expenses:   Food Insecurity:   . Worried About Charity fundraiser in the Last Year:   . Arboriculturist in the Last Year:   Transportation Needs:   . Film/video editor (Medical):   Marland Kitchen Lack of Transportation (Non-Medical):   Physical Activity:   . Days of Exercise per Week:   . Minutes of Exercise per Session:   Stress:   . Feeling of Stress :   Social Connections:   . Frequency of Communication with Friends and Family:   . Frequency of Social Gatherings with Friends and Family:   . Attends Religious Services:   . Active Member of Clubs or Organizations:   . Attends Archivist Meetings:   Marland Kitchen Marital Status:   Intimate Partner Violence:   . Fear of Current or Ex-Partner:   . Emotionally Abused:   Marland Kitchen Physically Abused:   . Sexually Abused:      Family History  Problem Relation Age of Onset  . Diabetes Mother   . Alcohol abuse Father     ROS: '[x]'$  Positive   '[ ]'$  Negative   '[ ]'$  All sytems reviewed and are negative  Cardiac: '[]'$  chest pain/pressure '[]'$  palpitations '[]'$  SOB lying flat '[]'$  DOE  Vascular: '[]'$  pain in legs while walking '[]'$  pain in legs at rest '[]'$  pain in legs at night '[x]'$  non-healing ulcers '[]'$  hx of DVT '[]'$  swelling in legs  Pulmonary: '[]'$  productive cough '[]'$  asthma/wheezing '[]'$  home O2  Neurologic: '[]'$  weakness in '[]'$  arms '[]'$  legs '[]'$  numbness in '[]'$  arms '[]'$  legs '[]'$  hx of CVA '[]'$  mini stroke '[]'$ difficulty speaking or slurred speech '[]'$  temporary loss of vision in one eye '[]'$  dizziness  Hematologic: '[]'$  hx of cancer '[]'$  bleeding problems '[]'$  problems with blood clotting easily  Endocrine:   '[x]'$  diabetes '[]'$  thyroid disease  GI '[]'$  vomiting blood '[]'$  blood in stool  GU: '[]'$  CKD/renal failure  '[]'$  HD--'[]'$  M/W/F or '[]'$  T/T/S '[]'$  burning with urination '[]'$  blood in urine  Psychiatric: '[]'$  anxiety '[]'$  depression  Musculoskeletal: '[]'$  arthritis '[]'$  joint pain  Integumentary: '[]'$  rashes '[]'$  ulcers  Constitutional: '[]'$  fever '[]'$  chills   Physical Examination  Vitals:   10/31/19 0730 10/31/19 1136  BP: (!) 141/82 101/77  Pulse: 82 98  Resp: 18   Temp: 99.5 F (37.5 C) 98.5 F (36.9 C)  SpO2: 95%    Body mass index is 30.79 kg/m.  General:  WDWN in NAD Gait: Not observed HENT: WNL, normocephalic Pulmonary: normal non-labored breathing, without Rales, rhonchi,  wheezing  Cardiac: regular, without  Murmurs, rubs or gallops; without carotid bruits Abdomen:  soft, NT/ND, no masses Skin: without rashes Vascular Exam/Pulses:  Right Left  Radial absent 2+ (normal)  Ulnar Not eval Not eval  Brachial absent absent  Femoral 2+ (normal) absent  Popliteal absent absent  DP 2+ (normal) Doppler mono  PT Doppler mono Doppler mono  Peroneal Doppler tri Doppler bi   Extremities: with ischemic changes, with Gangrene , with cellulitis; with open wounds;  Musculoskeletal: no muscle wasting or atrophy.Sensation and motor function of both feet intact. No bruit in left upper arm AVG  Neurologic: A&O X 3;  No focal weakness or paresthesias are detected; speech is fluent/normal Psychiatric:  The pt has Normal affect.  Left foot    Right foot    CBC    Component Value Date/Time   WBC 12.7 (H) 10/31/2019 0525   RBC 3.81 (L) 10/31/2019 0525   HGB 9.7 (L) 10/31/2019 0525   HCT 32.5 (L) 10/31/2019 0525   PLT 190 10/31/2019 0525   MCV 85.3 10/31/2019 0525   MCH 25.5 (L) 10/31/2019 0525   MCHC 29.8 (L) 10/31/2019 0525   RDW 15.4 10/31/2019 0525    BMET    Component Value Date/Time   NA 132 (L) 10/31/2019 0525   K 3.8 10/31/2019 0525   CL 100 10/31/2019 0525   CO2 21 (L) 10/31/2019 0525   GLUCOSE 174 (H) 10/31/2019 0525   BUN 22 (H) 10/31/2019 0525   CREATININE 1.37 (H)  10/31/2019 0525   CALCIUM 9.0 10/31/2019 0525   GFRNONAA >60 10/31/2019 0525   GFRAA >60 10/31/2019 0525    COAGS: Lab Results  Component Value Date   INR 1.3 (H) 10/28/2019     Non-Invasive Vascular Imaging:   ABI Findings:  +---------+------------------+-----+----------+--------+  Right  Rt Pressure (mmHg)IndexWaveform Comment   +---------+------------------+-----+----------+--------+  Brachial 127           triphasic       +---------+------------------+-----+----------+--------+  PTA   82        0.59 biphasic       +---------+------------------+-----+----------+--------+  DP    98        0.70 monophasic      +---------+------------------+-----+----------+--------+  Great Toe78        0.56 Abnormal       +---------+------------------+-----+----------+--------+   +--------+------------------+-----+----------+-------+  Left  Lt Pressure (mmHg)IndexWaveform Comment  +--------+------------------+-----+----------+-------+  Brachial140           triphasic       +--------+------------------+-----+----------+-------+  PTA   255        1.82 biphasic       +--------+------------------+-----+----------+-------+  DP   67        0.48 monophasic      +--------+------------------+-----+----------+-------+   +-------+-----------+-----------+------------+------------+  ABI/TBIToday's ABIToday's TBIPrevious ABIPrevious TBI  +-------+-----------+-----------+------------+------------+  Right 0.70    0.56                  +-------+-----------+-----------+------------+------------+  Left  0.48                        +-------+-----------+-----------+------------+------------+     ASSESSMENT/PLAN: This is a 44 y.o. male  With bilateral draining foot wounds and poorly controlled DM,  hypertension.  Radiographs of both feet suspicious for osteomyelitis of bilateral 5th metatarsals. MRI of left foot "findings could be suggestive of Darrie Macmillan osteo..."  He has palpable AT pulse on the right.  Will need bilateral foot debridement and is  high risk for limb loss.  ? aortogram with LLE run-off as ABI, pulse exam and wound worse as compared to RLE.  Dr. Donnetta Hutching on-call vascular surgeon will assess and provide recommendations.  -recommend Dakins solution damp-to-dry dressing changes to both lower extremity wounds   Risa Grill, PA-C [facility] [phone]  I have examined the patient, reviewed and agree with above.  Very difficult management situation.  Patient with renal transplant.  Reports that he was transplanted approximately 4 years ago.  Insulin-dependent diabetic.  Presents with 35-monthhistory of a wound on his right foot at the fifth metatarsal head.  Reports that he has had a wound on his left foot for approximately 1 month.  Admitted with sepsis.  Has easily palpable popliteal pulses bilaterally.  Has a 2+ right dorsalis pedis pulse.  Has biphasic signals at the dorsalis pedis and posterior tibial bilaterally.  Has extensive tissue loss in his left foot.  Has full-thickness loss over his entire left lateral malleolus and also has full-thickness loss over his lateral lateral foot.  Agree with Dr. SCannon Kettlethat the next step is aggressive debridement of both feet.  He undoubtedly has small vessel disease into his feet bilaterally but very unlikely that he would have correctable tibial disease on the left.  Has completely normal tibial flow to the dorsalis pedis level on the right.  Would be at risk for damage to his kidney transplant with contrast.  I had a very frank discussion with the patient that he is at high risk for left below-knee amputation due to his extensive tissue loss.  We will follow  TCurt Jews MD 10/31/2019 4:30 PM

## 2019-10-31 NOTE — Progress Notes (Signed)
Podiatry progress note  Subjective: 44 year old diabetic male patient seen at bedside for follow-up evaluation of bilateral foot ulcers.  Patient is assisted bedside by sister.  Patient denies any pedal complaints or overnight events and reports that the vascular doctor came by to see him today and gave him the news about his feet.   Current Facility-Administered Medications:  .  aspirin EC tablet 81 mg, 81 mg, Oral, Daily, Chotiner, Yevonne Aline, MD, 81 mg at 10/31/19 0851 .  atorvastatin (LIPITOR) tablet 40 mg, 40 mg, Oral, Daily, Chotiner, Yevonne Aline, MD, 40 mg at 10/31/19 0851 .  ceFAZolin (ANCEF) IVPB 2g/100 mL premix, 2 g, Intravenous, Q8H, Domenic Polite, MD, Last Rate: 200 mL/hr at 10/31/19 2302, 2 g at 10/31/19 2302 .  dextrose 50 % solution 0-50 mL, 0-50 mL, Intravenous, PRN, Stark Jock, Douglas, MD .  dorzolamide-timolol (COSOPT) 22.3-6.8 MG/ML ophthalmic solution 1 drop, 1 drop, Left Eye, BID, Chotiner, Yevonne Aline, MD, 1 drop at 10/31/19 2302 .  heparin injection 5,000 Units, 5,000 Units, Subcutaneous, Q8H, Chotiner, Yevonne Aline, MD, 5,000 Units at 10/31/19 2257 .  insulin aspart (novoLOG) injection 0-15 Units, 0-15 Units, Subcutaneous, TID WC, Domenic Polite, MD, 5 Units at 10/31/19 1300 .  insulin aspart (novoLOG) injection 0-5 Units, 0-5 Units, Subcutaneous, QHS, Domenic Polite, MD, 2 Units at 10/28/19 2139 .  insulin aspart (novoLOG) injection 3 Units, 3 Units, Subcutaneous, TID WC, Domenic Polite, MD, 3 Units at 10/31/19 1732 .  insulin glargine (LANTUS) injection 35 Units, 35 Units, Subcutaneous, Daily, Domenic Polite, MD, 35 Units at 10/31/19 0855 .  insulin regular, human (MYXREDLIN) 100 units/ 100 mL infusion, , Intravenous, Continuous, Chotiner, Yevonne Aline, MD .  LORazepam (ATIVAN) injection 1 mg, 1 mg, Intravenous, Q30 min PRN, Domenic Polite, MD, 1 mg at 10/30/19 1219 .  mycophenolate (MYFORTIC) EC tablet 540 mg, 540 mg, Oral, BID, Domenic Polite, MD, 540 mg at 10/31/19 2258 .   ondansetron (ZOFRAN) injection 4 mg, 4 mg, Intravenous, Q6H PRN, Domenic Polite, MD .  tacrolimus (PROGRAF) capsule 6 mg, 6 mg, Oral, BID, Domenic Polite, MD, 6 mg at 10/31/19 2257  Allergies  Allergen Reactions  . Shellfish Allergy Anaphylaxis  . Shellfish-Derived Products Anaphylaxis  . Sulfa Antibiotics Anaphylaxis    Other reaction(s): Other (See Comments)  . Iodinated Diagnostic Agents Other (See Comments)    Other reaction(s): Other (see comments)  . Iodine Other (See Comments)    Other reaction(s): Other (see comments)     Objective: Vitals:   10/31/19 0730 10/31/19 1136 10/31/19 1500 10/31/19 2043  BP: (!) 141/82 101/77 127/76 116/66  Pulse: 82 98 97 94  Resp: 18   16  Temp: 99.5 F (37.5 C) 98.5 F (36.9 C) 98.9 F (37.2 C) 99.2 F (37.3 C)  TempSrc: Oral Oral Oral Oral  SpO2: 95%   98%  Weight:      Height:       General no acute distress Focused lower extremity exam Dry dressings clean and intact with no bloody strikethrough continue with odor present at the left foot appears to be decreased since yesterday.  Decreased pain with palpation bilateral.    CBC Latest Ref Rng & Units 10/31/2019 10/30/2019 10/29/2019  WBC 4.0 - 10.5 K/uL 12.7(H) 14.6(H) 17.6(H)  Hemoglobin 13.0 - 17.0 g/dL 9.7(L) 10.1(L) 10.6(L)  Hematocrit 39 - 52 % 32.5(L) 32.9(L) 35.9(L)  Platelets 150 - 400 K/uL 190 236 285   BMP Latest Ref Rng & Units 10/31/2019 10/30/2019 10/29/2019  Glucose 70 - 99  mg/dL 174(H) 163(H) 362(H)  BUN 6 - 20 mg/dL 22(H) 21(H) 32(H)  Creatinine 0.61 - 1.24 mg/dL 1.37(H) 1.32(H) 1.66(H)  Sodium 135 - 145 mmol/L 132(L) 135 141  Potassium 3.5 - 5.1 mmol/L 3.8 4.0 4.6  Chloride 98 - 111 mmol/L 100 104 109  CO2 22 - 32 mmol/L 21(L) 23 20(L)  Calcium 8.9 - 10.3 mg/dL 9.0 9.4 10.3    Assessment and plan: Problem List Items Addressed This Visit      Endocrine   * (Principal) DKA (diabetic ketoacidosis) (HCC) - Primary   Relevant Medications   aspirin EC tablet  81 mg   atorvastatin (LIPITOR) tablet 40 mg   insulin regular, human (MYXREDLIN) 100 units/ 100 mL infusion   insulin aspart (novoLOG) injection 0-5 Units   insulin aspart (novoLOG) injection 0-15 Units   insulin glargine (LANTUS) injection 35 Units   insulin aspart (novoLOG) injection 3 Units     Other   Sepsis (HCC)   Relevant Orders   DG Chest 1 View (Completed)   DG Foot 2 Views Right (Completed)    Other Visit Diagnoses    Ulcerated, foot ([place])       Relevant Orders   DG Foot 2 Views Left (Completed)     -Patient seen and evaluated -Vascular recommendations reviewed -Discussed with patient the possibility of extensive small vessel disease and the risk of nonhealing however at this time we will attempt and plan to do bilateral wound debridement, biopsy of bone/culture, and application of graft to assist with wound healing and made patient very well aware if wound fails to heal he may progress to require further surgery or amputation; we will plan to schedule this debridement procedure within the next day or 2 after a few more doses of antibiotics -Continue with antibiotics; WBC is downtrending which is a great improvement since admission -Wound care orders placed for Dakin's and dry dressing until time for surgery -Continue with medical management -Podiatry to continue to follow  Dr. Cannon Kettle [facility] [phone] office [phone] cell

## 2019-11-01 DIAGNOSIS — E1110 Type 2 diabetes mellitus with ketoacidosis without coma: Secondary | ICD-10-CM | POA: Diagnosis not present

## 2019-11-01 LAB — GLUCOSE, CAPILLARY
Glucose-Capillary: 163 mg/dL — ABNORMAL HIGH (ref 70–99)
Glucose-Capillary: 219 mg/dL — ABNORMAL HIGH (ref 70–99)
Glucose-Capillary: 85 mg/dL (ref 70–99)
Glucose-Capillary: 121 mg/dL — ABNORMAL HIGH (ref 70–99)
Glucose-Capillary: 129 mg/dL — ABNORMAL HIGH (ref 70–99)

## 2019-11-01 NOTE — Progress Notes (Addendum)
°  Progress Note    11/01/2019 7:39 AM Hospital Day 4  Subjective:  No complaints; says Dr. Cannon Kettle has been by this morning.  Tm 99.8 now 99.1  Vitals:   11/01/19 0531 11/01/19 0723  BP: 136/76 (!) 142/69  Pulse: 86 96  Resp: 16 18  Temp: 99.8 F (37.7 C) 99.1 F (37.3 C)  SpO2: 96% 99%    Physical Exam: Cardiac:  regular Lungs:  Non labored Extremities:  Palpable right DP pulse and brisk doppler signal left PT/AT  CBC    Component Value Date/Time   WBC 12.7 (H) 10/31/2019 0525   RBC 3.81 (L) 10/31/2019 0525   HGB 9.7 (L) 10/31/2019 0525   HCT 32.5 (L) 10/31/2019 0525   PLT 190 10/31/2019 0525   MCV 85.3 10/31/2019 0525   MCH 25.5 (L) 10/31/2019 0525   MCHC 29.8 (L) 10/31/2019 0525   RDW 15.4 10/31/2019 0525    BMET    Component Value Date/Time   NA 132 (L) 10/31/2019 0525   K 3.8 10/31/2019 0525   CL 100 10/31/2019 0525   CO2 21 (L) 10/31/2019 0525   GLUCOSE 174 (H) 10/31/2019 0525   BUN 22 (H) 10/31/2019 0525   CREATININE 1.37 (H) 10/31/2019 0525   CALCIUM 9.0 10/31/2019 0525   GFRNONAA >60 10/31/2019 0525   GFRAA >60 10/31/2019 0525    INR    Component Value Date/Time   INR 1.3 (H) 10/28/2019 1259     Intake/Output Summary (Last 24 hours) at 11/01/2019 0739 Last data filed at 11/01/2019 0600 Gross per 24 hour  Intake 1053.45 ml  Output 1350 ml  Net -296.55 ml     Assessment/Plan:  44 y.o. male with bilateral foot wounds, poorly controlled DM and hypertension Hospital Day 4  -pt with palpable right DP pulse and brisk left PT doppler signal.  Podiatry plans for bilateral wound debridement, biopsy of bone/culture, and application of graft to assist with wound healing in the next couple of days.  Discussed with pt again that he is at high risk for amputation if this fails to heal.  He understands.   -wound care with dakin's to be continued and abx.    Leontine Locket, PA-C [facility] [phone] 11/01/2019 7:39 AM

## 2019-11-01 NOTE — Progress Notes (Signed)
Occupational Therapy Treatment Patient Details Name: Anthony Blanchard MRN: [id number] DOB: Sep 16, 1975 Today's Date: 11/01/2019    History of present illness 44 y.o. 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 In ED found to have CBG of >1150, ABG ph 7.32, tachycardia and sepsis. Admitted 10/28/19 for treatement of DKA, sepsis possibly due to R foot ulcer and encephalopathy.    OT comments  Patient progressing toward goals with tx. session focused on functional transfers and household mobility with hand held assist this date. Patient with expressed nervousness about pending procedure for bilateral wound debridement and application of graft with possibility of amputation if diabetic foot ulcers do not heal. OT provided emotional support. Patient continues to benefit from acute OT services in prep for d/c to next level of care with continued recommendation for SNF rehab given the patient's current deficits including low vision 2/2 uncontrolled DM II, decreased static/dynamic standing balance and need for assist with BADLs including bathing/dressing and toileting/hygiene/clothing management.    Follow Up Recommendations  SNF;Supervision/Assistance - 24 hour    Equipment Recommendations  Other (comment);3 in 1 bedside commode    Recommendations for Other Services      Precautions / Restrictions Precautions Precautions: Fall Restrictions Weight Bearing Restrictions: No       Mobility Bed Mobility Overal bed mobility: Needs Assistance             General bed mobility comments: Pt. seated EOB upon entry.   Transfers Overall transfer level: Needs assistance Equipment used: 1 person hand held assist Transfers: Sit to/from Stand Sit to Stand: Min  guard         General transfer comment: STS from EOB with Min guard. Pt. demonstrates increased safety awarness compared to previous sessions.     Balance Overall balance assessment: Needs assistance Sitting-balance support: Feet supported;No upper extremity supported Sitting balance-Leahy Scale: Fair     Standing balance support: Single extremity supported;During functional activity Standing balance-Leahy Scale: Poor Standing balance comment: Hand held assist and "furniture walking" during mobility                            ADL either performed or assessed with clinical judgement   ADL       Grooming: Minimal assistance;Sitting;Standing   Upper Body Bathing:  (2/2 low vision )                           Functional mobility during ADLs: Minimal assistance;Cueing for safety (Hand held assist with cueing 2/2 low vision ) General ADL Comments: Patient increased activity tolerance with no apparent fatigue after ambulation short distance in room. Pt. able to maintain casual conversation throughout tx. session.      Vision       Perception     Praxis      Cognition Arousal/Alertness: Awake/alert Behavior During Therapy: WFL for tasks assessed/performed Overall Cognitive Status: No family/caregiver present to determine baseline cognitive functioning Area of Impairment: Attention;Memory;Following commands;Safety/judgement;Awareness;Problem solving;Orientation                       Following Commands: Follows multi-step commands with increased time Safety/Judgement: Decreased awareness of safety;Decreased awareness of deficits Awareness: Emergent Problem Solving: Requires verbal cues;Requires  tactile cues General Comments: Pt. with increased awareness of situation and current deficits this date.         Exercises     Shoulder Instructions       General Comments Pt. aware of plan for sx. and possiblity of amputation if ulcers do not  heal. Pt. verbalized fear about what will happen if his wounds do not heal.     Pertinent Vitals/ Pain       Pain Assessment: 0-10 Pain Score: 3  Pain Location: Abdomen. No c/o BLE pain this session.  Pain Descriptors / Indicators: Discomfort Pain Intervention(s): Monitored during session  Home Living                                          Prior Functioning/Environment              Frequency  Min 2X/week        Progress Toward Goals  OT Goals(current goals can now be found in the care plan section)  Progress towards OT goals: Progressing toward goals  Acute Rehab OT Goals Patient Stated Goal: To return home.  OT Goal Formulation: With patient Time For Goal Achievement: 11/12/19 Potential to Achieve Goals: Good ADL Goals Pt Will Perform Grooming: with min guard assist;standing Pt Will Perform Lower Body Dressing: with min guard assist;sitting/lateral leans;sit to/from stand Pt Will Transfer to Toilet: with min guard assist;ambulating;bedside commode Additional ADL Goal #1: Pt will utilize compensatory strategies to adapt to low vision in B/L eyes to increase carry over skills of navigating his environment safely wtih minimal cues. Additional ADL Goal #2: Pt will increase to minguardA overall for OOB ADL tasks.  Plan Discharge plan remains appropriate    Co-evaluation                 AM-PAC OT "6 Clicks" Daily Activity     Outcome Measure   Help from another person eating meals?: None Help from another person taking care of personal grooming?: A Little Help from another person toileting, which includes using toliet, bedpan, or urinal?: A Lot Help from another person bathing (including washing, rinsing, drying)?: A Lot Help from another person to put on and taking off regular upper body clothing?: A Little Help from another person to put on and taking off regular lower body clothing?: A Lot 6 Click Score: 16    End of Session Equipment  Utilized During Treatment: Gait belt  OT Visit Diagnosis: Unsteadiness on feet (R26.81);Muscle weakness (generalized) (M62.81);Pain;Low vision, both eyes (H54.2)   Activity Tolerance Patient tolerated treatment well   Patient Left in chair;with call bell/phone within reach;with chair alarm set   Nurse Communication          Time: [phone] OT Time Calculation (min): 14 min  Charges: OT General Charges $OT Visit: 1 Visit OT Treatments $Therapeutic Activity: 8-22 mins  Marquisha Nikolov H. OTR/L Supplemental OT, Department of rehab services [phone]   Ziana Heyliger R H. 11/01/2019, 2:54 PM

## 2019-11-01 NOTE — Progress Notes (Signed)
pts BS at dinner was 83, did not give S.S coverage, pt ate about 50% of his meal and this nurse rechecked his BS after eating and BS 121, this nurse did not give the 3 units as BS was just over parameter.

## 2019-11-01 NOTE — Progress Notes (Signed)
PROGRESS NOTE    Anthony Blanchard  JZP:[id number] DOB: [birth year] DOA: 10/28/2019 PCP: Pleas Koch, NP   Brief Narrative: Patient is a 44 year old male with history of diabetes type 2, ESRD status post kidney transplant 2016 at [facility], legally blind, CKD with baseline creatinine around 1.5-1.7, GERD, coronary artery disease who presented to the emergency department after a fall with abrasion to the shoulder, laceration of the lip and abrasion to the forehead.  He was complaining of generalized weakness, feeling disoriented, no loss of consciousness though.  He has poor compliance with insulin.  Patient also has ulcers on his bilateral feet.  On presentation he was found to have severe hyperglycemia with ketones in the urine.  He was admitted for the management of DKA, acute kidney injury and sepsis due to foot ulcers.  Podiatry, vascular surgery consulted.  Plan for debridement of bilateral lower extremity wounds.  Assessment & Plan:   Principal Problem:   DKA (diabetic ketoacidosis) ([place]) Active Problems:   Kidney transplanted   Hyperlipidemia   Sepsis ([place])   Essential hypertension   Hyperkalemia   CKD (chronic kidney disease) stage 4, GFR 15-29 ml/min (HCC)   Diabetic foot ulcer ([place])   DKA (diabetic ketoacidoses) ([place])  DKA: Secondary to poor compliance with insulin and concurrent foot infection.  Insulin drip was stopped and now on Lantus and sliding scale.  Hemoglobin A1c of 12.1.  Diabetic coordinator following.  Sepsis/bilateral diabetic foot ulcers: Has chronic bilateral foot ulcers.  ABI suggested diminished circulation in the lower extremities.  Vascular surgery consulted,no plan for revascularization or amputation  Continue antibiotic Ancef.  X-rays of the both feet indicating some concern for osteomyelitis.  MRI is suspicious of   early osteomyelitis of the lateral plantar base of the fifth metatarsal .Podiatry planning for debrigement.  Streptococcus agalactiae bacteremia:  Seen on one of the 2 blood culture bottles.  Currently on Ancef.  Repeat blood cultures have been sent and they are negative.  Will discuss with ID at some point regarding duration of antibiotics.  AKI on stage IIIb CKD/renal transplant/hyperkalemia: Baseline creatinine around 1.5-1.7.  Presented with creatinine of 2.8 now closer to baseline.  Continue Myfortic and Prograf.  Hyperkalemia has resolved.  Coronary artery disease: Patient is status post PCI to RCA in 2009 and PCI to LAD in 2013  Encephalopathy: Metabolic encephalopathy in the setting of DKA, AKI, sepsis.  Currently alert and oriented.  Generalized weakness/debility/deconditioning: PT consulted.  Recommended home health on discharge.         DVT prophylaxis:Heparin [place] Code Status: Full Family Communication: None present at bedside Status is: Inpatient  Remains inpatient appropriate because:IV treatments appropriate due to intensity of illness or inability to take PO   Dispo: The patient is from: Home              Anticipated d/c is to: Home              Anticipated d/c date is: 3 days              Patient currently is not medically stable to d/c.  Waiting for debridement from podiatry.   Consultants: Vascular surgery, podiatry  Procedures: None  Antimicrobials:  Anti-infectives (From admission, onward)   Start     Dose/Rate Route Frequency Ordered Stop   10/29/19 1400  ceFAZolin (ANCEF) IVPB 2g/100 mL premix     Discontinue     2 g 200 mL/hr over 30 Minutes Intravenous Every 8 hours 10/29/19 0906  10/29/19 0700  vancomycin (VANCOCIN) IVPB 1000 mg/200 mL premix  Status:  Discontinued        1,000 mg 200 mL/hr over 60 Minutes Intravenous Every 24 hours 10/28/19 0625 10/28/19 0751   10/28/19 0815  metroNIDAZOLE (FLAGYL) IVPB 500 mg  Status:  Discontinued        500 mg 100 mL/hr over 60 Minutes Intravenous Every 8 hours 10/28/19 0814 10/28/19 1126   10/28/19 0700  ceFEPIme (MAXIPIME) 2 g in sodium chloride  0.9 % 100 mL IVPB  Status:  Discontinued        2 g 200 mL/hr over 30 Minutes Intravenous Every 24 hours 10/28/19 0625 10/29/19 0906   10/28/19 0630  vancomycin (VANCOCIN) IVPB 1000 mg/200 mL premix  Status:  Discontinued        1,000 mg 200 mL/hr over 60 Minutes Intravenous  Once 10/28/19 0620 10/28/19 0757   10/28/19 0415  vancomycin (VANCOCIN) IVPB 1000 mg/200 mL premix        1,000 mg 200 mL/hr over 60 Minutes Intravenous  Once 10/28/19 0408 10/28/19 0649   10/28/19 0415  piperacillin-tazobactam (ZOSYN) IVPB 3.375 g        3.375 g 12.5 mL/hr over 240 Minutes Intravenous  Once 10/28/19 0408 10/28/19 0804      Subjective: Patient seen and examined at bedside this morning.  Hemodynamically stable.  Comfortable.  No new complaints  Objective: Vitals:   10/31/19 2043 11/01/19 0019 11/01/19 0531 11/01/19 0723  BP: 116/66 (!) 148/68 136/76 (!) 142/69  Pulse: 94 92 86 96  Resp: 16 18 16 18   Temp: 99.2 F (37.3 C) 99.3 F (37.4 C) 99.8 F (37.7 C) 99.1 F (37.3 C)  TempSrc: Oral Oral Oral Oral  SpO2: 98% 94% 96% 99%  Weight:   95.6 kg   Height:        Intake/Output Summary (Last 24 hours) at 11/01/2019 0814 Last data filed at 11/01/2019 0600 Gross per 24 hour  Intake 1053.45 ml  Output 1350 ml  Net -296.55 ml   Filed Weights   10/30/19 0225 10/31/19 0412 11/01/19 0531  Weight: 94 kg 94.6 kg 95.6 kg    Examination:   General exam:Comfortable,obese HEENT:Absence of right eye,legally blind Respiratory system: Bilateral equal air entry, normal vesicular breath sounds, no wheezes or crackles  Cardiovascular system: S1 & S2 heard, RRR. No JVD, murmurs, rubs, gallops or clicks. Gastrointestinal system: Abdomen is nondistended, soft and nontender. No organomegaly or masses felt. Normal bowel sounds heard. Central nervous system: Alert and oriented. Extremities: No edema, no clubbing ,no cyanosis Skin: Bilateral foot ulcers        Data Reviewed: I have personally  reviewed following labs and imaging studies  CBC: Recent Labs  Lab 10/28/19 0237 10/28/19 0512 10/29/19 0416 10/30/19 0429 10/31/19 0525  WBC 20.3*  --  17.6* 14.6* 12.7*  HGB 11.9* 11.9* 10.6* 10.1* 9.7*  HCT 43.5 35.0* 35.9* 32.9* 32.5*  MCV 92.2  --  84.9 84.6 85.3  PLT 316  --  285 236 440   Basic Metabolic Panel: Recent Labs  Lab 10/28/19 1353 10/29/19 0209 10/29/19 0416 10/30/19 0429 10/31/19 0525  NA 145 142 141 135 132*  K 4.2 4.4 4.6 4.0 3.8  CL 110 112* 109 104 100  CO2 22 20* 20* 23 21*  GLUCOSE 280* 347* 362* 163* 174*  BUN 40* 32* 32* 21* 22*  CREATININE 2.02* 1.67* 1.66* 1.32* 1.37*  CALCIUM 10.4* 10.1 10.3 9.4 9.0   GFR: Estimated  Creatinine Clearance: 79.4 mL/min (A) (by C-G formula based on SCr of 1.37 mg/dL (H)). Liver Function Tests: Recent Labs  Lab 10/28/19 0237  AST 14*  ALT 16  ALKPHOS 93  BILITOT 1.6*  PROT 8.1  ALBUMIN 2.8*   No results for input(s): LIPASE, AMYLASE in the last 168 hours. No results for input(s): AMMONIA in the last 168 hours. Coagulation Profile: Recent Labs  Lab 10/28/19 1259  INR 1.3*   Cardiac Enzymes: No results for input(s): CKTOTAL, CKMB, CKMBINDEX, TROPONINI in the last 168 hours. BNP (last 3 results) No results for input(s): PROBNP in the last 8760 hours. HbA1C: No results for input(s): HGBA1C in the last 72 hours. CBG: Recent Labs  Lab 10/31/19 0727 10/31/19 1134 10/31/19 1637 10/31/19 2039 11/01/19 0627  GLUCAP 170* 208* 119* 178* 163*   Lipid Profile: No results for input(s): CHOL, HDL, LDLCALC, TRIG, CHOLHDL, LDLDIRECT in the last 72 hours. Thyroid Function Tests: No results for input(s): TSH, T4TOTAL, FREET4, T3FREE, THYROIDAB in the last 72 hours. Anemia Panel: No results for input(s): VITAMINB12, FOLATE, FERRITIN, TIBC, IRON, RETICCTPCT in the last 72 hours. Sepsis Labs: Recent Labs  Lab 10/28/19 0237 10/28/19 0453  LATICACIDVEN 3.2* 2.4*    Recent Results (from the past 240  hour(s))  Blood culture (routine x 2)     Status: None (Preliminary result)   Collection Time: 10/28/19  4:55 AM   Specimen: BLOOD RIGHT ARM  Result Value Ref Range Status   Specimen Description BLOOD RIGHT ARM  Final   Special Requests   Final    BOTTLES DRAWN AEROBIC AND ANAEROBIC Blood Culture adequate volume   Culture   Final    NO GROWTH 3 DAYS Performed at [facility], [address], [place], [place] [address]    Report Status PENDING  Incomplete  Blood culture (routine x 2)     Status: Abnormal   Collection Time: 10/28/19  5:02 AM   Specimen: BLOOD LEFT ARM  Result Value Ref Range Status   Specimen Description BLOOD LEFT ARM  Final   Special Requests   Final    BOTTLES DRAWN AEROBIC AND ANAEROBIC Blood Culture adequate volume   Culture  Setup Time   Final    GRAM POSITIVE COCCI IN CHAINS AEROBIC BOTTLE ONLY CRITICAL RESULT CALLED TO, READ BACK BY AND VERIFIED WITH: PHARMD LAURA SEAY AT2252 BY MESSAN H. ON 10/28/2019 Performed at [facility], [place] [address], [place], [place] [address]    Culture GROUP B STREP(S.AGALACTIAE)ISOLATED (A)  Final   Report Status 10/31/2019 FINAL  Final   Organism ID, Bacteria GROUP B STREP(S.AGALACTIAE)ISOLATED  Final      Susceptibility   Group b strep(s.agalactiae)isolated - MIC*    CLINDAMYCIN <=0.25 SENSITIVE Sensitive     AMPICILLIN <=0.25 SENSITIVE Sensitive     ERYTHROMYCIN <=0.12 SENSITIVE Sensitive     VANCOMYCIN 0.5 SENSITIVE Sensitive     CEFTRIAXONE <=0.12 SENSITIVE Sensitive     LEVOFLOXACIN 1 SENSITIVE Sensitive     PENICILLIN Value in next row Sensitive      SENSITIVE<=0.06    * GROUP B STREP(S.AGALACTIAE)ISOLATED  Blood Culture ID Panel (Reflexed)     Status: Abnormal   Collection Time: 10/28/19  5:02 AM  Result Value Ref Range Status   Enterococcus species NOT DETECTED NOT DETECTED Final   Listeria monocytogenes NOT DETECTED NOT DETECTED Final   Staphylococcus species NOT DETECTED NOT DETECTED Final    Staphylococcus aureus (BCID) NOT DETECTED NOT DETECTED  Final   Streptococcus species DETECTED (A) NOT DETECTED Final    Comment: CRITICAL RESULT CALLED TO, READ BACK BY AND VERIFIED WITH: PHARMD LAURA SEAY AT 2252 BY MESSAN H. ON  10/28/2019    Streptococcus agalactiae DETECTED (A) NOT DETECTED Final    Comment: CRITICAL RESULT CALLED TO, READ BACK BY AND VERIFIED WITH: PHARMD LAURA SEAY AT 2252 BY MESSAN H. ON  10/28/2019    Streptococcus pneumoniae NOT DETECTED NOT DETECTED Final   Streptococcus pyogenes NOT DETECTED NOT DETECTED Final   Acinetobacter baumannii NOT DETECTED NOT DETECTED Final   Enterobacteriaceae species NOT DETECTED NOT DETECTED Final   Enterobacter cloacae complex NOT DETECTED NOT DETECTED Final   Escherichia coli NOT DETECTED NOT DETECTED Final   Klebsiella oxytoca NOT DETECTED NOT DETECTED Final   Klebsiella pneumoniae NOT DETECTED NOT DETECTED Final   Proteus species NOT DETECTED NOT DETECTED Final   Serratia marcescens NOT DETECTED NOT DETECTED Final   Haemophilus influenzae NOT DETECTED NOT DETECTED Final   Neisseria meningitidis NOT DETECTED NOT DETECTED Final   Pseudomonas aeruginosa NOT DETECTED NOT DETECTED Final   Candida albicans NOT DETECTED NOT DETECTED Final   Candida glabrata NOT DETECTED NOT DETECTED Final   Candida krusei NOT DETECTED NOT DETECTED Final   Candida parapsilosis NOT DETECTED NOT DETECTED Final   Candida tropicalis NOT DETECTED NOT DETECTED Final    Comment: Performed at [facility], [place]. [address] [place], [place] [address]  Urine culture     Status: Abnormal   Collection Time: 10/28/19  5:09 AM   Specimen: Urine, Random  Result Value Ref Range Status   Specimen Description URINE, RANDOM  Final   Special Requests NONE  Final   Culture (A)  Final    <10,000 COLONIES/mL INSIGNIFICANT GROWTH Performed at [facility], [place] [address] [place], [place] [address]    Report Status 10/29/2019 FINAL  Final  SARS Coronavirus  2 by RT PCR (hospital order, performed in [facility] hospital lab) Nasopharyngeal Nasopharyngeal Swab     Status: None   Collection Time: 10/28/19  7:37 AM   Specimen: Nasopharyngeal Swab  Result Value Ref Range Status   SARS Coronavirus 2 NEGATIVE NEGATIVE Final    Comment: (NOTE) SARS-CoV-2 target nucleic acids are NOT DETECTED.  The SARS-CoV-2 RNA is generally detectable in upper and lower respiratory specimens during the acute phase of infection. The lowest concentration of SARS-CoV-2 viral copies this assay can detect is 250 copies / mL. A negative result does not preclude SARS-CoV-2 infection and should not be used as the sole basis for treatment or other patient management decisions.  A negative result may occur with improper specimen collection / handling, submission of specimen other than nasopharyngeal swab, presence of viral mutation(s) within the areas targeted by this assay, and inadequate number of viral copies (<250 copies / mL). A negative result must be combined with clinical observations, patient history, and epidemiological information.  Fact Sheet for Patients:   StrictlyIdeas.no  Fact Sheet for Healthcare Providers: BankingDealers.co.za  This test is not yet approved or  cleared by the [place] FDA and has been authorized for detection and/or diagnosis of SARS-CoV-2 by FDA under an Emergency Use Authorization (EUA).  This EUA will remain in effect (meaning this test can be used) for the duration of the COVID-19 declaration under Section 564(b)(1) of the Act, 21 U.S.C. section 360bbb-3(b)(1), unless the authorization is terminated or revoked sooner.  Performed at [facility], [address] [place], [place] [address]   Culture, blood (routine x 2)     Status: None (Preliminary result)   Collection Time: 10/30/19  4:53 PM   Specimen: BLOOD RIGHT HAND  Result Value Ref Range Status   Specimen  Description BLOOD RIGHT HAND  Final   Special Requests   Final    BOTTLES DRAWN AEROBIC AND ANAEROBIC Blood Culture adequate volume   Culture   Final    NO GROWTH < 24 HOURS Performed at [facility], [place] [address], [place], [place] [address]    Report Status PENDING  Incomplete  Culture, blood (routine x 2)     Status: None (Preliminary result)   Collection Time: 10/30/19  5:01 PM   Specimen: BLOOD LEFT HAND  Result Value Ref Range Status   Specimen Description BLOOD LEFT HAND  Final   Special Requests   Final    BOTTLES DRAWN AEROBIC AND ANAEROBIC Blood Culture adequate volume   Culture   Final    NO GROWTH < 24 HOURS Performed at [facility], [place] [address] [place], [place] [address]    Report Status PENDING  Incomplete         Radiology Studies: MR FOOT LEFT WO CONTRAST  Result Date: 10/30/2019 CLINICAL DATA:  Fifth metatarsal ulcer and possible osteomyelitis EXAM: MRI OF THE LEFT FOOT WITHOUT CONTRAST TECHNIQUE: Multiplanar, multisequence MR imaging of the left was performed. No intravenous contrast was administered. COMPARISON:  October 29, 2019 radiograph FINDINGS: Bones/Joint/Cartilage There is minimally increased T2 hyperintense signal seen at the lateral base of the fifth metatarsal. There is possible subtle T1 hypointensity seen the plantar surface of the fifth metatarsal base, series 7, image 5. No osseous fracture, cortical destruction or periosteal reaction. There is an overlying soft tissue ulceration with subcutaneous emphysema. Normal osseous marrow signal seen throughout the remainder of the forefoot. The articular surfaces appear to be maintained. No large joint effusions. Ligaments The Lisfranc ligaments are intact. Muscles and Tendons Mild fatty atrophy noted within the musculature with diffusely increased feathery signal. The flexor and extensor tendons appear to be intact. Soft tissues Area of a superficial ulceration seen overlying the lateral base of  the fifth metatarsal with subcutaneous emphysema. Overlying skin thickening and subcutaneous edema is noted. No loculated fluid collections are noted. IMPRESSION: Findings which could be suggestive of early osteomyelitis of the lateral plantar base of the fifth metatarsal. No osseous fracture. Overlying superficial ulceration with subcutaneous emphysema and diffuse edema. No soft tissue abscess or sinus tract. Electronically Signed   By: Prudencio Pair M.D.   On: 10/30/2019 16:50   VAS [place] ABI WITH/WO TBI  Result Date: 10/30/2019 LOWER EXTREMITY DOPPLER STUDY Indications: Ulceration. High Risk Factors: Hypertension, Diabetes, coronary artery disease.  Limitations: Today's exam was limited due to no prior. Comparison Study: no prior Performing Technologist: Abram Sander RVS  Examination Guidelines: A complete evaluation includes at minimum, Doppler waveform signals and systolic blood pressure reading at the level of bilateral brachial, anterior tibial, and posterior tibial arteries, when vessel segments are accessible. Bilateral testing is considered an integral part of a complete examination. Photoelectric Plethysmograph (PPG) waveforms and toe systolic pressure readings are included as required and additional duplex testing as needed. Limited examinations for reoccurring indications may be performed as noted.  ABI Findings: +---------+------------------+-----+----------+--------+ Right    Rt Pressure (mmHg)IndexWaveform  Comment  +---------+------------------+-----+----------+--------+ Brachial 127  triphasic          +---------+------------------+-----+----------+--------+ PTA      82                0.59 biphasic           +---------+------------------+-----+----------+--------+ DP       98                0.70 monophasic         +---------+------------------+-----+----------+--------+ Great Toe78                0.56 Abnormal            +---------+------------------+-----+----------+--------+ +--------+------------------+-----+----------+-------+ Left    Lt Pressure (mmHg)IndexWaveform  Comment +--------+------------------+-----+----------+-------+ Brachial140                    triphasic         +--------+------------------+-----+----------+-------+ PTA     255               1.82 biphasic          +--------+------------------+-----+----------+-------+ DP      67                0.48 monophasic        +--------+------------------+-----+----------+-------+ +-------+-----------+-----------+------------+------------+ ABI/TBIToday's ABIToday's TBIPrevious ABIPrevious TBI +-------+-----------+-----------+------------+------------+ Right  0.70       0.56                                +-------+-----------+-----------+------------+------------+ Left   0.48                                           +-------+-----------+-----------+------------+------------+  Summary:  *See table(s) above for measurements and observations.  Electronically signed by Harold Barban MD on 10/30/2019 at 4:24:28 PM.   Final         Scheduled Meds: . aspirin EC  81 mg Oral Daily  . atorvastatin  40 mg Oral Daily  . dorzolamide-timolol  1 drop Left Eye BID  . heparin  5,000 Units Subcutaneous Q8H  . insulin aspart  0-15 Units Subcutaneous TID WC  . insulin aspart  0-5 Units Subcutaneous QHS  . insulin aspart  3 Units Subcutaneous TID WC  . insulin glargine  35 Units Subcutaneous Daily  . mycophenolate  540 mg Oral BID  . tacrolimus  6 mg Oral BID   Continuous Infusions: .  ceFAZolin (ANCEF) IV 2 g (11/01/19 0600)  . insulin       LOS: 4 days    Time spent: 25 mins,More than 50% of that time was spent in counseling and/or coordination of care.      Shelly Coss, MD [facility] P7/15/2021, 8:14 AM

## 2019-11-02 ENCOUNTER — Ambulatory Visit (INDEPENDENT_AMBULATORY_CARE_PROVIDER_SITE_OTHER): Payer: Medicare HMO | Admitting: Podiatry

## 2019-11-02 DIAGNOSIS — E1110 Type 2 diabetes mellitus with ketoacidosis without coma: Secondary | ICD-10-CM | POA: Diagnosis not present

## 2019-11-02 DIAGNOSIS — Z5329 Procedure and treatment not carried out because of patient's decision for other reasons: Secondary | ICD-10-CM

## 2019-11-02 LAB — CBC WITH DIFFERENTIAL/PLATELET
Abs Immature Granulocytes: 0.16 10*3/uL — ABNORMAL HIGH (ref 0.00–0.07)
Basophils Absolute: 0 10*3/uL (ref 0.0–0.1)
Basophils Relative: 0 %
Eosinophils Absolute: 0 10*3/uL (ref 0.0–0.5)
Eosinophils Relative: 0 %
HCT: 32.6 % — ABNORMAL LOW (ref 39.0–52.0)
Hemoglobin: 9.6 g/dL — ABNORMAL LOW (ref 13.0–17.0)
Immature Granulocytes: 1 %
Lymphocytes Relative: 11 %
Lymphs Abs: 1.5 10*3/uL (ref 0.7–4.0)
MCH: 24.6 pg — ABNORMAL LOW (ref 26.0–34.0)
MCHC: 29.4 g/dL — ABNORMAL LOW (ref 30.0–36.0)
MCV: 83.6 fL (ref 80.0–100.0)
Monocytes Absolute: 1.4 10*3/uL — ABNORMAL HIGH (ref 0.1–1.0)
Monocytes Relative: 10 %
Neutro Abs: 10.8 10*3/uL — ABNORMAL HIGH (ref 1.7–7.7)
Neutrophils Relative %: 78 %
Platelets: 183 10*3/uL (ref 150–400)
RBC: 3.9 MIL/uL — ABNORMAL LOW (ref 4.22–5.81)
RDW: 15.4 % (ref 11.5–15.5)
WBC: 13.9 10*3/uL — ABNORMAL HIGH (ref 4.0–10.5)
nRBC: 0 % (ref 0.0–0.2)

## 2019-11-02 LAB — BASIC METABOLIC PANEL
Anion gap: 9 (ref 5–15)
BUN: 22 mg/dL — ABNORMAL HIGH (ref 6–20)
CO2: 21 mmol/L — ABNORMAL LOW (ref 22–32)
Calcium: 9 mg/dL (ref 8.9–10.3)
Chloride: 100 mmol/L (ref 98–111)
Creatinine, Ser: 1.42 mg/dL — ABNORMAL HIGH (ref 0.61–1.24)
GFR calc Af Amer: >60 mL/min (ref >60)
GFR calc non Af Amer: >60 mL/min (ref >60)
Glucose, Bld: 133 mg/dL — ABNORMAL HIGH (ref 70–99)
Potassium: 4 mmol/L (ref 3.5–5.1)
Sodium: 130 mmol/L — ABNORMAL LOW (ref 135–145)

## 2019-11-02 LAB — MRSA PCR SCREENING: MRSA by PCR: NEGATIVE

## 2019-11-02 LAB — GLUCOSE, CAPILLARY
Glucose-Capillary: 123 mg/dL — ABNORMAL HIGH (ref 70–99)
Glucose-Capillary: 150 mg/dL — ABNORMAL HIGH (ref 70–99)
Glucose-Capillary: 235 mg/dL — ABNORMAL HIGH (ref 70–99)
Glucose-Capillary: 134 mg/dL — ABNORMAL HIGH (ref 70–99)

## 2019-11-02 LAB — CULTURE, BLOOD (ROUTINE X 2)
Culture: NO GROWTH
Special Requests: ADEQUATE

## 2019-11-02 MED ORDER — ACETAMINOPHEN 325 MG PO TABS
650.0000 mg | ORAL_TABLET | Freq: Four times a day (QID) | ORAL | Status: DC | PRN
  Administered 2019-11-02 – 2019-11-04 (×3): 650 mg via ORAL
  Filled 2019-11-02 (×3): qty 2

## 2019-11-02 NOTE — Anesthesia Preprocedure Evaluation (Addendum)
Anesthesia Evaluation  Patient identified by MRN, date of birth, ID band Patient awake    Reviewed: Allergy & Precautions, NPO status , Patient's Chart, lab work & pertinent test results, reviewed documented beta blocker date and time   Airway Mallampati: II  TM Distance: >3 FB Neck ROM: Full    Dental  (+) Dental Advisory Given, Teeth Intact   Pulmonary neg pulmonary ROS,    Pulmonary exam normal breath sounds clear to auscultation       Cardiovascular hypertension, Pt. on home beta blockers and Pt. on medications + CAD  Normal cardiovascular exam Rhythm:Regular Rate:Normal     Neuro/Psych negative neurological ROS     GI/Hepatic Neg liver ROS, GERD  ,  Endo/Other  diabetes  Renal/GU Renal disease     Musculoskeletal negative musculoskeletal ROS (+)   Abdominal   Peds  Hematology negative hematology ROS (+)   Anesthesia Other Findings   Reproductive/Obstetrics                            Anesthesia Physical Anesthesia Plan  ASA: III  Anesthesia Plan: MAC   Post-op Pain Management:    Induction: Intravenous  PONV Risk Score and Plan: 2 and Propofol infusion, Treatment may vary due to age or medical condition, TIVA and Ondansetron  Airway Management Planned: Natural Airway  Additional Equipment: None  Intra-op Plan:   Post-operative Plan:   Informed Consent: I have reviewed the patients History and Physical, chart, labs and discussed the procedure including the risks, benefits and alternatives for the proposed anesthesia with the patient or authorized representative who has indicated his/her understanding and acceptance.     Dental advisory given  Plan Discussed with: CRNA  Anesthesia Plan Comments:        Anesthesia Quick Evaluation

## 2019-11-02 NOTE — Care Management Important Message (Signed)
Important Message  Patient Details  Name: RENTON BERKLEY MRN: [id number] Date of Birth: 1975-08-27   Medicare Important Message Given:  Yes     Shelda Altes 11/02/2019, 11:48 AM

## 2019-11-02 NOTE — Progress Notes (Signed)
Patient ID: Anthony Blanchard, male   DOB: [birth year], 44 y.o.   MRN: [id number] Plan for bilateral foot debridement by Dr. Cannon Kettle noted.  We will see again next week.  Please call over the weekend for concerns

## 2019-11-02 NOTE — TOC Progression Note (Signed)
Transition of Care [facility]) - Progression Note    Patient Details  Name: Anthony Blanchard MRN: [id number] Date of Birth: 05-09-1975  [facility] of [facility]) CM/SW Contact  Graves-Bigelow, Ocie Cornfield, RN Phone Number: 11/02/2019, 9:42 AM  Clinical Narrative:  Case Manager received another referral for [facility] (SNF). Patient has declined the option for SNF and wants to go home with home health services. His sister Anthony Blanchard will be at the home for assistance.  Patient was set up for home health on 10-31-19 with [facility] in [place]. Case Manager will continue to follow for additional transition of care needs.   Expected Discharge Plan: [place] Barriers to Discharge: Continued Medical Work up  Expected Discharge Plan and Services Expected Discharge Plan: [place] In-house Referral: Clinical Social Work Discharge Planning Services: CM Consult Post Acute Care Choice: [place] arrangements for the past 2 months: Single Family Home                 DME Arranged: N/A   HH Arranged: RN, Disease Management, PT, OT, Nurse's Aide HH Agency: [place] Date [facility] Agency Contacted: 10/31/19 Time [place]: [place] Representative spoke with at [place]: [place]   Readmission Risk Interventions No flowsheet data found.

## 2019-11-02 NOTE — Progress Notes (Signed)
PROGRESS NOTE    Anthony Blanchard  ZOX:[id number] DOB: 1975/05/30 DOA: 10/28/2019 PCP: Pleas Koch, NP   Brief Narrative: Patient is a 44 year old male with history of diabetes type 2, ESRD status post kidney transplant 2016 at [facility], legally blind, CKD with baseline creatinine around 1.5-1.7, GERD, coronary artery disease who presented to the emergency department after a fall with abrasion to the shoulder, laceration of the lip and abrasion to the forehead.  He was complaining of generalized weakness, feeling disoriented, no loss of consciousness though.  He has poor compliance with insulin.  Patient also has ulcers on his bilateral feet.  On presentation he was found to have severe hyperglycemia with ketones in the urine.  He was admitted for the management of DKA, acute kidney injury and sepsis due to foot ulcers.  Podiatry, vascular surgery consulted.  Plan for debridement of bilateral lower extremity wounds.  Assessment & Plan:   Principal Problem:   DKA (diabetic ketoacidosis) ([place]) Active Problems:   Kidney transplanted   Hyperlipidemia   Sepsis ([place])   Essential hypertension   Hyperkalemia   CKD (chronic kidney disease) stage 4, GFR 15-29 ml/min (HCC)   Diabetic foot ulcer ([place])   DKA (diabetic ketoacidoses) ([place])  DKA: Secondary to poor compliance with insulin and concurrent foot infection.  Insulin drip was stopped and now on Lantus and sliding scale.  Hemoglobin A1c of 12.1.  Diabetic coordinator following.  Sepsis/bilateral diabetic foot ulcers: Has chronic bilateral foot ulcers.  ABI suggested diminished circulation in the lower extremities.  Vascular surgery consulted,no plan for revascularization or amputation  Continue antibiotic Ancef.  X-rays of the both feet indicating some concern for osteomyelitis.  MRI is suspicious of   early osteomyelitis of the lateral plantar base of the fifth metatarsal .Podiatry planning for debrigement.  Streptococcus agalactiae bacteremia:  Seen on one of the 2 blood culture bottles.  Currently on Ancef.  Repeat blood cultures have been sent and they are negative.  Will discuss with ID at some point regarding duration of antibiotics.  AKI on stage IIIb CKD/renal transplant/hyperkalemia: Baseline creatinine around 1.5-1.7.  Presented with creatinine of 2.8 now closer to baseline.  Continue Myfortic and Prograf.  Hyperkalemia has resolved.  Coronary artery disease: Patient is status post PCI to RCA in 2009 and PCI to LAD in 2013  Encephalopathy: Metabolic encephalopathy in the setting of DKA, AKI, sepsis.  Currently alert and oriented.  Generalized weakness/debility/deconditioning: PT consulted.  Recommended home health on discharge.         DVT prophylaxis:Heparin [place] Code Status: Full Family Communication: None present at bedside Status is: Inpatient  Remains inpatient appropriate because:IV treatments appropriate due to intensity of illness or inability to take PO   Dispo: The patient is from: Home              Anticipated d/c is to: Home              Anticipated d/c date is: 3 days              Patient currently is not medically stable to d/c.  Waiting for debridement from podiatry.   Consultants: Vascular surgery, podiatry  Procedures: None  Antimicrobials:  Anti-infectives (From admission, onward)   Start     Dose/Rate Route Frequency Ordered Stop   10/29/19 1400  ceFAZolin (ANCEF) IVPB 2g/100 mL premix     Discontinue     2 g 200 mL/hr over 30 Minutes Intravenous Every 8 hours 10/29/19 0906  10/29/19 0700  vancomycin (VANCOCIN) IVPB 1000 mg/200 mL premix  Status:  Discontinued        1,000 mg 200 mL/hr over 60 Minutes Intravenous Every 24 hours 10/28/19 0625 10/28/19 0751   10/28/19 0815  metroNIDAZOLE (FLAGYL) IVPB 500 mg  Status:  Discontinued        500 mg 100 mL/hr over 60 Minutes Intravenous Every 8 hours 10/28/19 0814 10/28/19 1126   10/28/19 0700  ceFEPIme (MAXIPIME) 2 g in sodium chloride  0.9 % 100 mL IVPB  Status:  Discontinued        2 g 200 mL/hr over 30 Minutes Intravenous Every 24 hours 10/28/19 0625 10/29/19 0906   10/28/19 0630  vancomycin (VANCOCIN) IVPB 1000 mg/200 mL premix  Status:  Discontinued        1,000 mg 200 mL/hr over 60 Minutes Intravenous  Once 10/28/19 0620 10/28/19 0757   10/28/19 0415  vancomycin (VANCOCIN) IVPB 1000 mg/200 mL premix        1,000 mg 200 mL/hr over 60 Minutes Intravenous  Once 10/28/19 0408 10/28/19 0649   10/28/19 0415  piperacillin-tazobactam (ZOSYN) IVPB 3.375 g        3.375 g 12.5 mL/hr over 240 Minutes Intravenous  Once 10/28/19 0408 10/28/19 0804      Subjective: Patient seen and examined at the bedside this morning.  Hemodynamically stable.  No new complaints.  Waiting for debridement tomorrow.  Objective: Vitals:   11/01/19 2011 11/02/19 0124 11/02/19 0426 11/02/19 0741  BP: 131/62 137/68 (!) 141/66 126/72  Pulse: (!) 105 95 84 87  Resp: 17 15 17 16   Temp: 100.3 F (37.9 C) 98.6 F (37 C) 99 F (37.2 C) 99 F (37.2 C)  TempSrc: Oral Oral Oral Oral  SpO2: 99% 99% 98% 97%  Weight:   92.2 kg   Height:        Intake/Output Summary (Last 24 hours) at 11/02/2019 0820 Last data filed at 11/02/2019 0500 Gross per 24 hour  Intake 840 ml  Output 800 ml  Net 40 ml   Filed Weights   10/31/19 0412 11/01/19 0531 11/02/19 0426  Weight: 94.6 kg 95.6 kg 92.2 kg    Examination:   General exam: comfortable,obese HEENT:Absence of right eye,legally blind Respiratory system: Bilateral equal air entry, normal vesicular breath sounds, no wheezes or crackles  Cardiovascular system: S1 & S2 heard, RRR. No JVD, murmurs, rubs, gallops or clicks. Gastrointestinal system: Abdomen is nondistended, soft and nontender. No organomegaly or masses felt. Normal bowel sounds heard. Central nervous system: Alert and oriented.. Extremities: No edema, no clubbing ,no cyanosis Skin: Bilateral foot ulcers           Data Reviewed: I  have personally reviewed following labs and imaging studies  CBC: Recent Labs  Lab 10/28/19 0237 10/28/19 0237 10/28/19 0512 10/29/19 0416 10/30/19 0429 10/31/19 0525 11/02/19 0335  WBC 20.3*  --   --  17.6* 14.6* 12.7* 13.9*  NEUTROABS  --   --   --   --   --   --  10.8*  HGB 11.9*   < > 11.9* 10.6* 10.1* 9.7* 9.6*  HCT 43.5   < > 35.0* 35.9* 32.9* 32.5* 32.6*  MCV 92.2  --   --  84.9 84.6 85.3 83.6  PLT 316  --   --  285 236 190 183   < > = values in this interval not displayed.   Basic Metabolic Panel: Recent Labs  Lab 10/29/19 0209 10/29/19 0416  10/30/19 0429 10/31/19 0525 11/02/19 0335  NA 142 141 135 132* 130*  K 4.4 4.6 4.0 3.8 4.0  CL 112* 109 104 100 100  CO2 20* 20* 23 21* 21*  GLUCOSE 347* 362* 163* 174* 133*  BUN 32* 32* 21* 22* 22*  CREATININE 1.67* 1.66* 1.32* 1.37* 1.42*  CALCIUM 10.1 10.3 9.4 9.0 9.0   GFR: Estimated Creatinine Clearance: 75.2 mL/min (A) (by C-G formula based on SCr of 1.42 mg/dL (H)). Liver Function Tests: Recent Labs  Lab 10/28/19 0237  AST 14*  ALT 16  ALKPHOS 93  BILITOT 1.6*  PROT 8.1  ALBUMIN 2.8*   No results for input(s): LIPASE, AMYLASE in the last 168 hours. No results for input(s): AMMONIA in the last 168 hours. Coagulation Profile: Recent Labs  Lab 10/28/19 1259  INR 1.3*   Cardiac Enzymes: No results for input(s): CKTOTAL, CKMB, CKMBINDEX, TROPONINI in the last 168 hours. BNP (last 3 results) No results for input(s): PROBNP in the last 8760 hours. HbA1C: No results for input(s): HGBA1C in the last 72 hours. CBG: Recent Labs  Lab 11/01/19 1126 11/01/19 1622 11/01/19 1819 11/01/19 2009 11/02/19 0737  GLUCAP 219* 85 121* 129* 123*   Lipid Profile: No results for input(s): CHOL, HDL, LDLCALC, TRIG, CHOLHDL, LDLDIRECT in the last 72 hours. Thyroid Function Tests: No results for input(s): TSH, T4TOTAL, FREET4, T3FREE, THYROIDAB in the last 72 hours. Anemia Panel: No results for input(s): VITAMINB12,  FOLATE, FERRITIN, TIBC, IRON, RETICCTPCT in the last 72 hours. Sepsis Labs: Recent Labs  Lab 10/28/19 0237 10/28/19 0453  LATICACIDVEN 3.2* 2.4*    Recent Results (from the past 240 hour(s))  Blood culture (routine x 2)     Status: None (Preliminary result)   Collection Time: 10/28/19  4:55 AM   Specimen: BLOOD RIGHT ARM  Result Value Ref Range Status   Specimen Description BLOOD RIGHT ARM  Final   Special Requests   Final    BOTTLES DRAWN AEROBIC AND ANAEROBIC Blood Culture adequate volume   Culture   Final    NO GROWTH 4 DAYS Performed at [facility], [address], [place], [place] [address]    Report Status PENDING  Incomplete  Blood culture (routine x 2)     Status: Abnormal   Collection Time: 10/28/19  5:02 AM   Specimen: BLOOD LEFT ARM  Result Value Ref Range Status   Specimen Description BLOOD LEFT ARM  Final   Special Requests   Final    BOTTLES DRAWN AEROBIC AND ANAEROBIC Blood Culture adequate volume   Culture  Setup Time   Final    GRAM POSITIVE COCCI IN CHAINS AEROBIC BOTTLE ONLY CRITICAL RESULT CALLED TO, READ BACK BY AND VERIFIED WITH: PHARMD LAURA SEAY AT2252 BY MESSAN H. ON 10/28/2019 Performed at [facility], [place] [address], [place], [place] [address]    Culture GROUP B STREP(S.AGALACTIAE)ISOLATED (A)  Final   Report Status 10/31/2019 FINAL  Final   Organism ID, Bacteria GROUP B STREP(S.AGALACTIAE)ISOLATED  Final      Susceptibility   Group b strep(s.agalactiae)isolated - MIC*    CLINDAMYCIN <=0.25 SENSITIVE Sensitive     AMPICILLIN <=0.25 SENSITIVE Sensitive     ERYTHROMYCIN <=0.12 SENSITIVE Sensitive     VANCOMYCIN 0.5 SENSITIVE Sensitive     CEFTRIAXONE <=0.12 SENSITIVE Sensitive     LEVOFLOXACIN 1 SENSITIVE Sensitive     PENICILLIN Value in next row Sensitive      SENSITIVE<=0.06    *  GROUP B STREP(S.AGALACTIAE)ISOLATED  Blood Culture ID Panel (Reflexed)     Status: Abnormal   Collection Time: 10/28/19  5:02 AM  Result Value Ref  Range Status   Enterococcus species NOT DETECTED NOT DETECTED Final   Listeria monocytogenes NOT DETECTED NOT DETECTED Final   Staphylococcus species NOT DETECTED NOT DETECTED Final   Staphylococcus aureus (BCID) NOT DETECTED NOT DETECTED Final   Streptococcus species DETECTED (A) NOT DETECTED Final    Comment: CRITICAL RESULT CALLED TO, READ BACK BY AND VERIFIED WITH: PHARMD LAURA SEAY AT 2252 BY MESSAN H. ON  10/28/2019    Streptococcus agalactiae DETECTED (A) NOT DETECTED Final    Comment: CRITICAL RESULT CALLED TO, READ BACK BY AND VERIFIED WITH: PHARMD LAURA SEAY AT 2252 BY MESSAN H. ON  10/28/2019    Streptococcus pneumoniae NOT DETECTED NOT DETECTED Final   Streptococcus pyogenes NOT DETECTED NOT DETECTED Final   Acinetobacter baumannii NOT DETECTED NOT DETECTED Final   Enterobacteriaceae species NOT DETECTED NOT DETECTED Final   Enterobacter cloacae complex NOT DETECTED NOT DETECTED Final   Escherichia coli NOT DETECTED NOT DETECTED Final   Klebsiella oxytoca NOT DETECTED NOT DETECTED Final   Klebsiella pneumoniae NOT DETECTED NOT DETECTED Final   Proteus species NOT DETECTED NOT DETECTED Final   Serratia marcescens NOT DETECTED NOT DETECTED Final   Haemophilus influenzae NOT DETECTED NOT DETECTED Final   Neisseria meningitidis NOT DETECTED NOT DETECTED Final   Pseudomonas aeruginosa NOT DETECTED NOT DETECTED Final   Candida albicans NOT DETECTED NOT DETECTED Final   Candida glabrata NOT DETECTED NOT DETECTED Final   Candida krusei NOT DETECTED NOT DETECTED Final   Candida parapsilosis NOT DETECTED NOT DETECTED Final   Candida tropicalis NOT DETECTED NOT DETECTED Final    Comment: Performed at [facility], [address] [place], [place] [address]  Urine culture     Status: Abnormal   Collection Time: 10/28/19  5:09 AM   Specimen: Urine, Random  Result Value Ref Range Status   Specimen Description URINE, RANDOM  Final   Special Requests NONE  Final   Culture (A)   Final    <10,000 COLONIES/mL INSIGNIFICANT GROWTH Performed at [facility], [place] [address] [place], [place] [address]    Report Status 10/29/2019 FINAL  Final  SARS Coronavirus 2 by RT PCR (hospital order, performed in [facility] hospital lab) Nasopharyngeal Nasopharyngeal Swab     Status: None   Collection Time: 10/28/19  7:37 AM   Specimen: Nasopharyngeal Swab  Result Value Ref Range Status   SARS Coronavirus 2 NEGATIVE NEGATIVE Final    Comment: (NOTE) SARS-CoV-2 target nucleic acids are NOT DETECTED.  The SARS-CoV-2 RNA is generally detectable in upper and lower respiratory specimens during the acute phase of infection. The lowest concentration of SARS-CoV-2 viral copies this assay can detect is 250 copies / mL. A negative result does not preclude SARS-CoV-2 infection and should not be used as the sole basis for treatment or other patient management decisions.  A negative result may occur with improper specimen collection / handling, submission of specimen other than nasopharyngeal swab, presence of viral mutation(s) within the areas targeted by this assay, and inadequate number of viral copies (<250 copies / mL). A negative result must be combined with clinical observations, patient history, and epidemiological information.  Fact Sheet for Patients:   StrictlyIdeas.no  Fact Sheet for Healthcare Providers: BankingDealers.co.za  This test is not yet approved or  cleared by the [place]  States FDA and has been authorized for detection and/or diagnosis of SARS-CoV-2 by FDA under an Emergency Use Authorization (EUA).  This EUA will remain in effect (meaning this test can be used) for the duration of the COVID-19 declaration under Section 564(b)(1) of the Act, 21 U.S.C. section 360bbb-3(b)(1), unless the authorization is terminated or revoked sooner.  Performed at [facility], [place] [address] [place],  [place] [address]   Culture, blood (routine x 2)     Status: None (Preliminary result)   Collection Time: 10/30/19  4:53 PM   Specimen: BLOOD RIGHT HAND  Result Value Ref Range Status   Specimen Description BLOOD RIGHT HAND  Final   Special Requests   Final    BOTTLES DRAWN AEROBIC AND ANAEROBIC Blood Culture adequate volume   Culture   Final    NO GROWTH 2 DAYS Performed at [facility], [place] [address], [place], [place] [address]    Report Status PENDING  Incomplete  Culture, blood (routine x 2)     Status: None (Preliminary result)   Collection Time: 10/30/19  5:01 PM   Specimen: BLOOD LEFT HAND  Result Value Ref Range Status   Specimen Description BLOOD LEFT HAND  Final   Special Requests   Final    BOTTLES DRAWN AEROBIC AND ANAEROBIC Blood Culture adequate volume   Culture   Final    NO GROWTH 2 DAYS Performed at [facility], [place] [address] [place], [place] [address]    Report Status PENDING  Incomplete         Radiology Studies: No results found.      Scheduled Meds: . aspirin EC  81 mg Oral Daily  . atorvastatin  40 mg Oral Daily  . dorzolamide-timolol  1 drop Left Eye BID  . heparin  5,000 Units Subcutaneous Q8H  . insulin aspart  0-15 Units Subcutaneous TID WC  . insulin aspart  0-5 Units Subcutaneous QHS  . insulin aspart  3 Units Subcutaneous TID WC  . insulin glargine  35 Units Subcutaneous Daily  . mycophenolate  540 mg Oral BID  . tacrolimus  6 mg Oral BID   Continuous Infusions: .  ceFAZolin (ANCEF) IV 2 g (11/02/19 0545)  . insulin       LOS: 5 days    Time spent: 25 mins,More than 50% of that time was spent in counseling and/or coordination of care.      Shelly Coss, MD [facility] P7/16/2021, 8:20 AM

## 2019-11-02 NOTE — Progress Notes (Signed)
Physical Therapy Treatment Patient Details Name: Anthony Blanchard MRN: [id number] DOB: 1975/09/03 Today's Date: 11/02/2019    History of Present Illness 44 y.o. 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 In ED found to have CBG of >1150, ABG ph 7.32, tachycardia and sepsis. Admitted 10/28/19 for treatement of DKA, sepsis possibly due to R foot ulcer and encephalopathy.     PT Comments    Pt supine in bed on entry, easily roused and agreeable to walking with therapy. Pt is limited in safe mobility by decreased vision, and L foot pain in presence of decreased strength, balance and endurance. Pt is supervision for bed mobility, min guard for transfers and min A for ambulation of 120 feet while pushing IV pole. Pt report stiffness and pain in L foot. D/c plans remain appropriate at this time.     Follow Up Recommendations  Supervision/Assistance - 24 hour;Home health PT     Equipment Recommendations  Other (comment) (TBD at next venue)       Precautions / Restrictions Precautions Precautions: Fall Precaution Comments: admitted after a fall Restrictions Weight Bearing Restrictions: No    Mobility  Bed Mobility Overal bed mobility: Needs Assistance Bed Mobility: Supine to Sit     Supine to sit: Supervision     General bed mobility comments: supervision for safety  Transfers Overall transfer level: Needs assistance Equipment used: 1 person hand held assist Transfers: Sit to/from Stand Sit to Stand: Min guard         General transfer comment: min guard for safety, good power up and self steadying   Ambulation/Gait Ambulation/Gait assistance: Min assist Gait Distance (Feet): 120 Feet Assistive device: 1 person hand held assist;IV  Pole Gait Pattern/deviations: Step-to pattern;Step-through pattern;Decreased stance time - left;Wide base of support Gait velocity: slowed   General Gait Details: minA and use of IV pole for support with slow, waddling gait           Balance Overall balance assessment: Needs assistance Sitting-balance support: Feet supported;No upper extremity supported Sitting balance-Leahy Scale: Fair     Standing balance support: Single extremity supported;During functional activity Standing balance-Leahy Scale: Poor Standing balance comment: Hand held assist and "furniture walking" during mobility                             Cognition Arousal/Alertness: Awake/alert Behavior During Therapy: WFL for tasks assessed/performed Overall Cognitive Status: No family/caregiver present to determine baseline cognitive functioning Area of Impairment: Attention;Memory;Following commands;Safety/judgement;Awareness;Problem solving;Orientation                     Memory: Decreased short-term memory Following Commands: Follows multi-step commands with increased time Safety/Judgement: Decreased awareness of safety;Decreased awareness of deficits Awareness: Anticipatory Problem Solving: Requires verbal cues;Requires tactile cues General Comments: pt with improving cognition         General Comments General comments (skin integrity, edema, etc.): VSS on RA      Pertinent Vitals/Pain Pain Assessment: Faces Faces Pain Scale: Hurts a little bit Pain Location: L foot with weightbearing  Pain Descriptors / Indicators: Grimacing Pain Intervention(s): Limited activity within patient's tolerance;Monitored during session;Repositioned    Home Living Family/patient expects to be discharged to:: Private residence Living Arrangements: Alone Available Help at Discharge: Available PRN/intermittently;Family (  sons ) Type of Home: Apartment Home Access: Level entry   Home Layout: One  level Home Equipment: None      Prior Function Level of Independence: Independent      Comments: pt reports independence but poor historian, states his sons assist with shopping but that he does, his own bathing and dressing   PT Goals (current goals can now be found in the care plan section) Acute Rehab PT Goals Patient Stated Goal: To return home.  PT Goal Formulation: With patient Time For Goal Achievement: 11/12/19 Potential to Achieve Goals: Fair Progress towards PT goals: Progressing toward goals    Frequency    Min 3X/week      PT Plan Current plan remains appropriate       AM-PAC PT "6 Clicks" Mobility   Outcome Measure  Help needed turning from your back to your side while in a flat bed without using bedrails?: None Help needed moving from lying on your back to sitting on the side of a flat bed without using bedrails?: A Little Help needed moving to and from a bed to a chair (including a wheelchair)?: A Little Help needed standing up from a chair using your arms (e.g., wheelchair or bedside chair)?: A Little Help needed to walk in hospital room?: A Lot Help needed climbing 3-5 steps with a railing? : A Lot 6 Click Score: 17    End of Session Equipment Utilized During Treatment: Gait belt Activity Tolerance: Patient tolerated treatment well Patient left: with call bell/phone within reach;in bed Nurse Communication: Mobility status PT Visit Diagnosis: Unsteadiness on feet (R26.81);Other abnormalities of gait and mobility (R26.89);History of falling (Z91.81);Muscle weakness (generalized) (M62.81);Difficulty in walking, not elsewhere classified (R26.2);Adult, failure to thrive (R62.7);Pain Pain - Right/Left:  (bilateral feet) Pain - part of body: Ankle and joints of foot     Time: 1457-1510 PT Time Calculation (min) (ACUTE ONLY): 13 min  Charges:  $Gait Training: 8-22 mins                     Adelyna Brockman B. Migdalia Dk PT, DPT Acute Rehabilitation  Services Pager [phone] Office [phone]    [place] 11/02/2019, 3:23 PM

## 2019-11-03 ENCOUNTER — Encounter (HOSPITAL_COMMUNITY)
Admission: EM | Disposition: A | Discharge status: Home Health | Payer: Self-pay | Source: Home / Self Care | Attending: Internal Medicine

## 2019-11-03 ENCOUNTER — Inpatient Hospital Stay (HOSPITAL_COMMUNITY): Payer: Medicare HMO | Admitting: Anesthesiology

## 2019-11-03 DIAGNOSIS — M86171 Other acute osteomyelitis, right ankle and foot: Secondary | ICD-10-CM

## 2019-11-03 DIAGNOSIS — M86172 Other acute osteomyelitis, left ankle and foot: Secondary | ICD-10-CM

## 2019-11-03 DIAGNOSIS — E1110 Type 2 diabetes mellitus with ketoacidosis without coma: Secondary | ICD-10-CM | POA: Diagnosis not present

## 2019-11-03 HISTORY — PX: INCISION AND DRAINAGE OF WOUND: SHX1803

## 2019-11-03 HISTORY — PX: BONE BIOPSY: SHX375

## 2019-11-03 HISTORY — PX: GRAFT APPLICATION: SHX6696

## 2019-11-03 LAB — GLUCOSE, CAPILLARY
Glucose-Capillary: 199 mg/dL — ABNORMAL HIGH (ref 70–99)
Glucose-Capillary: 180 mg/dL — ABNORMAL HIGH (ref 70–99)
Glucose-Capillary: 188 mg/dL — ABNORMAL HIGH (ref 70–99)
Glucose-Capillary: 151 mg/dL — ABNORMAL HIGH (ref 70–99)
Glucose-Capillary: 124 mg/dL — ABNORMAL HIGH (ref 70–99)

## 2019-11-03 LAB — SURGICAL PCR SCREEN
MRSA, PCR: NEGATIVE
Staphylococcus aureus: NEGATIVE

## 2019-11-03 SURGERY — IRRIGATION AND DEBRIDEMENT WOUND
Anesthesia: Monitor Anesthesia Care | Site: Foot | Laterality: Bilateral

## 2019-11-03 MED ORDER — PROMETHAZINE HCL 25 MG/ML IJ SOLN: 6.2500 mg | INTRAMUSCULAR | Status: DC | PRN

## 2019-11-03 MED ORDER — HYDROMORPHONE HCL 1 MG/ML IJ SOLN: 0.2500 mg | INTRAMUSCULAR | Status: DC | PRN

## 2019-11-03 MED ORDER — BUPIVACAINE HCL (PF) 0.25 % IJ SOLN
INTRAMUSCULAR | Status: AC
  Filled 2019-11-03: qty 30

## 2019-11-03 MED ORDER — ONDANSETRON HCL 4 MG/2ML IJ SOLN
INTRAMUSCULAR | Status: AC
  Filled 2019-11-03: qty 2

## 2019-11-03 MED ORDER — CHLORHEXIDINE GLUCONATE 0.12 % MT SOLN
15.0000 mL | Freq: Once | OROMUCOSAL | Status: AC
  Administered 2019-11-03: 15 mL via OROMUCOSAL
  Filled 2019-11-03: qty 15

## 2019-11-03 MED ORDER — BUPIVACAINE HCL 0.25 % IJ SOLN
INTRAMUSCULAR | Status: DC | PRN
  Administered 2019-11-03: 5 mL
  Administered 2019-11-03: 10 mL

## 2019-11-03 MED ORDER — 0.9 % SODIUM CHLORIDE (POUR BTL) OPTIME
TOPICAL | Status: DC | PRN
  Administered 2019-11-03 (×2): 1000 mL

## 2019-11-03 MED ORDER — FENTANYL CITRATE (PF) 250 MCG/5ML IJ SOLN
INTRAMUSCULAR | Status: DC | PRN
  Administered 2019-11-03: 50 ug via INTRAVENOUS
  Administered 2019-11-03 (×2): 25 ug via INTRAVENOUS

## 2019-11-03 MED ORDER — LACTATED RINGERS IV SOLN: INTRAVENOUS | Status: DC

## 2019-11-03 MED ORDER — PROPOFOL 500 MG/50ML IV EMUL
INTRAVENOUS | Status: DC | PRN
  Administered 2019-11-03: 100 ug/kg/min via INTRAVENOUS

## 2019-11-03 MED ORDER — ONDANSETRON HCL 4 MG/2ML IJ SOLN
INTRAMUSCULAR | Status: DC | PRN
  Administered 2019-11-03: 4 mg via INTRAVENOUS

## 2019-11-03 MED ORDER — LIDOCAINE HCL (PF) 1 % IJ SOLN
INTRAMUSCULAR | Status: AC
  Filled 2019-11-03: qty 30

## 2019-11-03 MED ORDER — FENTANYL CITRATE (PF) 250 MCG/5ML IJ SOLN
INTRAMUSCULAR | Status: AC
  Filled 2019-11-03: qty 5

## 2019-11-03 MED ORDER — MEPERIDINE HCL 25 MG/ML IJ SOLN: 6.2500 mg | INTRAMUSCULAR | Status: DC | PRN

## 2019-11-03 MED ORDER — MIDAZOLAM HCL 2 MG/2ML IJ SOLN
INTRAMUSCULAR | Status: AC
  Filled 2019-11-03: qty 2

## 2019-11-03 MED ORDER — MIDAZOLAM HCL 2 MG/2ML IJ SOLN
INTRAMUSCULAR | Status: DC | PRN
  Administered 2019-11-03: 2 mg via INTRAVENOUS

## 2019-11-03 MED ORDER — LIDOCAINE HCL 1 % IJ SOLN
INTRAMUSCULAR | Status: DC | PRN
  Administered 2019-11-03: 5 mL
  Administered 2019-11-03: 10 mL

## 2019-11-03 MED ORDER — MORPHINE SULFATE (PF) 2 MG/ML IV SOLN: 2.0000 mg | INTRAVENOUS | Status: DC | PRN

## 2019-11-03 SURGICAL SUPPLY — 48 items
APL PRP STRL LF DISP 70% ISPRP (MISCELLANEOUS) ×2
BLADE SURG 15 STRL LF DISP TIS (BLADE) ×2 IMPLANT
BLADE SURG 15 STRL SS (BLADE) ×4
BNDG CMPR 9X4 STRL LF SNTH (GAUZE/BANDAGES/DRESSINGS) ×1
BNDG COHESIVE 4X5 TAN STRL (GAUZE/BANDAGES/DRESSINGS) ×2 IMPLANT
BNDG ELASTIC 4X5.8 VLCR STR LF (GAUZE/BANDAGES/DRESSINGS) ×3 IMPLANT
BNDG ESMARK 4X9 LF (GAUZE/BANDAGES/DRESSINGS) ×2 IMPLANT
BNDG GAUZE ELAST 4 BULKY (GAUZE/BANDAGES/DRESSINGS) ×4 IMPLANT
CANISTER SUCT 3000ML PPV (MISCELLANEOUS) IMPLANT
CHLORAPREP W/TINT 26 (MISCELLANEOUS) ×3 IMPLANT
CNTNR URN SCR LID CUP LEK RST (MISCELLANEOUS) ×1 IMPLANT
CONT SPEC 4OZ STRL OR WHT (MISCELLANEOUS) ×4
COVER SURGICAL LIGHT HANDLE (MISCELLANEOUS) ×4 IMPLANT
CUFF TOURN SGL QUICK 18X4 (TOURNIQUET CUFF) ×3 IMPLANT
DRAPE EXTREMITY BILATERAL (DRAPES) ×1 IMPLANT
DRAPE SURG 17X23 STRL (DRAPES) ×2 IMPLANT
DRSG ADAPTIC 3X8 NADH LF (GAUZE/BANDAGES/DRESSINGS) ×2 IMPLANT
ELECT NDL TIP 2.8 STRL (NEEDLE) IMPLANT
ELECT NEEDLE TIP 2.8 STRL (NEEDLE) IMPLANT
ELECT REM PT RETURN 9FT ADLT (ELECTROSURGICAL)
ELECTRODE REM PT RTRN 9FT ADLT (ELECTROSURGICAL) IMPLANT
GAUZE SPONGE 4X4 12PLY STRL (GAUZE/BANDAGES/DRESSINGS) ×2 IMPLANT
GLOVE BIO SURGEON STRL SZ 6.5 (GLOVE) ×5 IMPLANT
GLOVE BIOGEL PI IND STRL 6.5 (GLOVE) ×1 IMPLANT
GLOVE BIOGEL PI INDICATOR 6.5 (GLOVE) ×3
GOWN STRL REUS W/ TWL LRG LVL3 (GOWN DISPOSABLE) ×2 IMPLANT
GOWN STRL REUS W/TWL LRG LVL3 (GOWN DISPOSABLE) ×6
KIT BASIN OR (CUSTOM PROCEDURE TRAY) ×2 IMPLANT
KIT TURNOVER KIT B (KITS) ×2 IMPLANT
MATRIX TISSUE MESHED 4X5 (Tissue) ×1 IMPLANT
MICROMATRIX 1000MG (Tissue) ×2 IMPLANT
NDL BIOPSY JAMSHIDI 8X6 (NEEDLE) IMPLANT
NDL HYPO 25GX1X1/2 BEV (NEEDLE) ×1 IMPLANT
NEEDLE BIOPSY JAMSHIDI 8X6 (NEEDLE) ×4 IMPLANT
NEEDLE HYPO 25GX1X1/2 BEV (NEEDLE) ×6 IMPLANT
NS IRRIG 1000ML POUR BTL (IV SOLUTION) ×2 IMPLANT
PACK ORTHO EXTREMITY (CUSTOM PROCEDURE TRAY) ×2 IMPLANT
PAD ABD 8X10 STRL (GAUZE/BANDAGES/DRESSINGS) ×2 IMPLANT
PAD ARMBOARD 7.5X6 YLW CONV (MISCELLANEOUS) ×4 IMPLANT
SOLUTION PARTIC MCRMTRX 1000MG (Tissue) IMPLANT
STAPLER VISISTAT 35W (STAPLE) ×2 IMPLANT
SYR 10ML LL (SYRINGE) ×3 IMPLANT
SYR CONTROL 10ML LL (SYRINGE) ×2 IMPLANT
TIP PROBE CYLINDRICAL MISONIX (TIP) ×1 IMPLANT
TOWEL GREEN STERILE (TOWEL DISPOSABLE) ×2 IMPLANT
TOWEL GREEN STERILE FF (TOWEL DISPOSABLE) ×2 IMPLANT
TUBE IRRIGATION SET MISONIX (TUBING) ×1 IMPLANT
YANKAUER SUCT BULB TIP NO VENT (SUCTIONS) IMPLANT

## 2019-11-03 NOTE — Progress Notes (Signed)
PROGRESS NOTE    Anthony Blanchard  WUJ:[id number] DOB: 1975/11/30 DOA: 10/28/2019 PCP: Pleas Koch, NP   Brief Narrative: Patient is a 44 year old male with history of diabetes type 2, ESRD status post kidney transplant 2016 at [facility], legally blind, CKD with baseline creatinine around 1.5-1.7, GERD, coronary artery disease who presented to the emergency department after a fall with abrasion to the shoulder, laceration of the lip and abrasion to the forehead.  He was complaining of generalized weakness, feeling disoriented, no loss of consciousness though.  He has poor compliance with insulin.  Patient also has ulcers on his bilateral feet.  On presentation he was found to have severe hyperglycemia with ketones in the urine.  He was admitted for the management of DKA, acute kidney injury and sepsis due to foot ulcers.  Podiatry, vascular surgery consulted.  Underwent Right and left foot wound debridement and bone biopsy right and left 5th metatarsals with deep wound cultures on 11/03/19.  Assessment & Plan:   Principal Problem:   DKA (diabetic ketoacidosis) ([place]) Active Problems:   Kidney transplanted   Hyperlipidemia   Sepsis ([place])   Essential hypertension   Hyperkalemia   CKD (chronic kidney disease) stage 4, GFR 15-29 ml/min (HCC)   Diabetic foot ulcer ([place])   DKA (diabetic ketoacidoses) ([place])  DKA: Secondary to poor compliance with insulin and concurrent foot infection.  Insulin drip was stopped and now on Lantus and sliding scale.  Hemoglobin A1c of 12.1.  Diabetic coordinator following.  Sepsis/bilateral diabetic foot ulcers: Has chronic bilateral foot ulcers.  ABI suggested diminished circulation in the lower extremities.  Vascular surgery consulted,no plan for revascularization or amputation  Continue antibiotic Ancef.  X-rays of the both feet indicating some concern for osteomyelitis.  MRI is suspicious of   early osteomyelitis of the lateral plantar base of the fifth metatarsal .   Underwent Right and left foot wound debridement and bone biopsy right and left 5th metatarsals with deep wound cultures on 11/03/19.  Streptococcus agalactiae bacteremia: Seen on one of the 2 blood culture bottles.  Currently on Ancef.  Repeat blood cultures have been sent and they are negative.  Will discuss with ID at some point regarding duration of antibiotics.  AKI on stage IIIb CKD/renal transplant/hyperkalemia: Baseline creatinine around 1.5-1.7.  Presented with creatinine of 2.8 now closer to baseline.  Continue Myfortic and Prograf.  Hyperkalemia has resolved.  Coronary artery disease: Patient is status post PCI to RCA in 2009 and PCI to LAD in 2013  Encephalopathy: Metabolic encephalopathy in the setting of DKA, AKI, sepsis.  Currently alert and oriented.  Generalized weakness/debility/deconditioning: PT consulted.  Recommended home health on discharge.         DVT prophylaxis:Heparin [place] Code Status: Full Family Communication: None present at bedside Status is: Inpatient  Remains inpatient appropriate because:IV treatments appropriate due to intensity of illness or inability to take PO   Dispo: The patient is from: Home              Anticipated d/c is to: Home              Anticipated d/c date is: 1-2 days              Patient currently is not medically stable to d/c.  Waiting for clearance from podiatry.   Consultants: Vascular surgery, podiatry  Procedures: None  Antimicrobials:  Anti-infectives (From admission, onward)   Start     Dose/Rate Route Frequency Ordered Stop  10/29/19 1400  ceFAZolin (ANCEF) IVPB 2g/100 mL premix     Discontinue     2 g 200 mL/hr over 30 Minutes Intravenous Every 8 hours 10/29/19 0906     10/29/19 0700  vancomycin (VANCOCIN) IVPB 1000 mg/200 mL premix  Status:  Discontinued        1,000 mg 200 mL/hr over 60 Minutes Intravenous Every 24 hours 10/28/19 0625 10/28/19 0751   10/28/19 0815  metroNIDAZOLE (FLAGYL) IVPB 500 mg  Status:   Discontinued        500 mg 100 mL/hr over 60 Minutes Intravenous Every 8 hours 10/28/19 0814 10/28/19 1126   10/28/19 0700  ceFEPIme (MAXIPIME) 2 g in sodium chloride 0.9 % 100 mL IVPB  Status:  Discontinued        2 g 200 mL/hr over 30 Minutes Intravenous Every 24 hours 10/28/19 0625 10/29/19 0906   10/28/19 0630  vancomycin (VANCOCIN) IVPB 1000 mg/200 mL premix  Status:  Discontinued        1,000 mg 200 mL/hr over 60 Minutes Intravenous  Once 10/28/19 0620 10/28/19 0757   10/28/19 0415  vancomycin (VANCOCIN) IVPB 1000 mg/200 mL premix        1,000 mg 200 mL/hr over 60 Minutes Intravenous  Once 10/28/19 0408 10/28/19 0649   10/28/19 0415  piperacillin-tazobactam (ZOSYN) IVPB 3.375 g        3.375 g 12.5 mL/hr over 240 Minutes Intravenous  Once 10/28/19 0408 10/28/19 0804      Subjective: Patient seen and examined at the bedside this morning.  Just returned from the debridement.  Complains of some pain on the debridement site..  Objective: Vitals:   11/03/19 0459 11/03/19 0915 11/03/19 0930 11/03/19 0940  BP: (!) 151/58 129/89 (!) 157/82   Pulse: 87 75 84   Resp: 16 14 14    Temp: 99.7 F (37.6 C) 97.6 F (36.4 C)  97.9 F (36.6 C)  TempSrc: Oral     SpO2: 100% 99% 100%   Weight: 92.2 kg     Height:        Intake/Output Summary (Last 24 hours) at 11/03/2019 1315 Last data filed at 11/03/2019 1200 Gross per 24 hour  Intake 750 ml  Output 830 ml  Net -80 ml   Filed Weights   11/01/19 0531 11/02/19 0426 11/03/19 0459  Weight: 95.6 kg 92.2 kg 92.2 kg    Examination:   General exam: comfortable,obese HEENT:Absence of right eye,legally blind Respiratory system: Bilateral equal air entry, normal vesicular breath sounds, no wheezes or crackles  Cardiovascular system: S1 & S2 heard, RRR. No JVD, murmurs, rubs, gallops or clicks. Gastrointestinal system: Abdomen is nondistended, soft and nontender. No organomegaly or masses felt. Normal bowel sounds heard. Central nervous  system: Alert and oriented.. Extremities: No edema, no clubbing ,no cyanosis Skin: Bilateral feet covered with dressing           Data Reviewed: I have personally reviewed following labs and imaging studies  CBC: Recent Labs  Lab 10/28/19 0237 10/28/19 0237 10/28/19 0512 10/29/19 0416 10/30/19 0429 10/31/19 0525 11/02/19 0335  WBC 20.3*  --   --  17.6* 14.6* 12.7* 13.9*  NEUTROABS  --   --   --   --   --   --  10.8*  HGB 11.9*   < > 11.9* 10.6* 10.1* 9.7* 9.6*  HCT 43.5   < > 35.0* 35.9* 32.9* 32.5* 32.6*  MCV 92.2  --   --  84.9 84.6 85.3 83.6  PLT 316  --   --  285 236 190 183   < > = values in this interval not displayed.   Basic Metabolic Panel: Recent Labs  Lab 10/29/19 0209 10/29/19 0416 10/30/19 0429 10/31/19 0525 11/02/19 0335  NA 142 141 135 132* 130*  K 4.4 4.6 4.0 3.8 4.0  CL 112* 109 104 100 100  CO2 20* 20* 23 21* 21*  GLUCOSE 347* 362* 163* 174* 133*  BUN 32* 32* 21* 22* 22*  CREATININE 1.67* 1.66* 1.32* 1.37* 1.42*  CALCIUM 10.1 10.3 9.4 9.0 9.0   GFR: Estimated Creatinine Clearance: 75.2 mL/min (A) (by C-G formula based on SCr of 1.42 mg/dL (H)). Liver Function Tests: Recent Labs  Lab 10/28/19 0237  AST 14*  ALT 16  ALKPHOS 93  BILITOT 1.6*  PROT 8.1  ALBUMIN 2.8*   No results for input(s): LIPASE, AMYLASE in the last 168 hours. No results for input(s): AMMONIA in the last 168 hours. Coagulation Profile: Recent Labs  Lab 10/28/19 1259  INR 1.3*   Cardiac Enzymes: No results for input(s): CKTOTAL, CKMB, CKMBINDEX, TROPONINI in the last 168 hours. BNP (last 3 results) No results for input(s): PROBNP in the last 8760 hours. HbA1C: No results for input(s): HGBA1C in the last 72 hours. CBG: Recent Labs  Lab 11/02/19 1742 11/02/19 2115 11/03/19 0635 11/03/19 0917 11/03/19 1009  GLUCAP 235* 134* 199* 180* 188*   Lipid Profile: No results for input(s): CHOL, HDL, LDLCALC, TRIG, CHOLHDL, LDLDIRECT in the last 72  hours. Thyroid Function Tests: No results for input(s): TSH, T4TOTAL, FREET4, T3FREE, THYROIDAB in the last 72 hours. Anemia Panel: No results for input(s): VITAMINB12, FOLATE, FERRITIN, TIBC, IRON, RETICCTPCT in the last 72 hours. Sepsis Labs: Recent Labs  Lab 10/28/19 0237 10/28/19 0453  LATICACIDVEN 3.2* 2.4*    Recent Results (from the past 240 hour(s))  Blood culture (routine x 2)     Status: None   Collection Time: 10/28/19  4:55 AM   Specimen: BLOOD RIGHT ARM  Result Value Ref Range Status   Specimen Description BLOOD RIGHT ARM  Final   Special Requests   Final    BOTTLES DRAWN AEROBIC AND ANAEROBIC Blood Culture adequate volume   Culture   Final    NO GROWTH 5 DAYS Performed at [facility], [address], [place], [place] [address]    Report Status 11/02/2019 FINAL  Final  Blood culture (routine x 2)     Status: Abnormal   Collection Time: 10/28/19  5:02 AM   Specimen: BLOOD LEFT ARM  Result Value Ref Range Status   Specimen Description BLOOD LEFT ARM  Final   Special Requests   Final    BOTTLES DRAWN AEROBIC AND ANAEROBIC Blood Culture adequate volume   Culture  Setup Time   Final    GRAM POSITIVE COCCI IN CHAINS AEROBIC BOTTLE ONLY CRITICAL RESULT CALLED TO, READ BACK BY AND VERIFIED WITH: PHARMD LAURA SEAY AT2252 BY MESSAN H. ON 10/28/2019 Performed at [facility], [place] [address] [place], [place] [address]    Culture GROUP B STREP(S.AGALACTIAE)ISOLATED (A)  Final   Report Status 10/31/2019 FINAL  Final   Organism ID, Bacteria GROUP B STREP(S.AGALACTIAE)ISOLATED  Final      Susceptibility   Group b strep(s.agalactiae)isolated - MIC*    CLINDAMYCIN <=0.25 SENSITIVE Sensitive     AMPICILLIN <=0.25 SENSITIVE Sensitive     ERYTHROMYCIN <=0.12 SENSITIVE Sensitive     VANCOMYCIN 0.5 SENSITIVE Sensitive  CEFTRIAXONE <=0.12 SENSITIVE Sensitive     LEVOFLOXACIN 1 SENSITIVE Sensitive     PENICILLIN Value in next row Sensitive      SENSITIVE<=0.06     * GROUP B STREP(S.AGALACTIAE)ISOLATED  Blood Culture ID Panel (Reflexed)     Status: Abnormal   Collection Time: 10/28/19  5:02 AM  Result Value Ref Range Status   Enterococcus species NOT DETECTED NOT DETECTED Final   Listeria monocytogenes NOT DETECTED NOT DETECTED Final   Staphylococcus species NOT DETECTED NOT DETECTED Final   Staphylococcus aureus (BCID) NOT DETECTED NOT DETECTED Final   Streptococcus species DETECTED (A) NOT DETECTED Final    Comment: CRITICAL RESULT CALLED TO, READ BACK BY AND VERIFIED WITH: PHARMD LAURA SEAY AT 2252 BY MESSAN H. ON  10/28/2019    Streptococcus agalactiae DETECTED (A) NOT DETECTED Final    Comment: CRITICAL RESULT CALLED TO, READ BACK BY AND VERIFIED WITH: PHARMD LAURA SEAY AT 2252 BY MESSAN H. ON  10/28/2019    Streptococcus pneumoniae NOT DETECTED NOT DETECTED Final   Streptococcus pyogenes NOT DETECTED NOT DETECTED Final   Acinetobacter baumannii NOT DETECTED NOT DETECTED Final   Enterobacteriaceae species NOT DETECTED NOT DETECTED Final   Enterobacter cloacae complex NOT DETECTED NOT DETECTED Final   Escherichia coli NOT DETECTED NOT DETECTED Final   Klebsiella oxytoca NOT DETECTED NOT DETECTED Final   Klebsiella pneumoniae NOT DETECTED NOT DETECTED Final   Proteus species NOT DETECTED NOT DETECTED Final   Serratia marcescens NOT DETECTED NOT DETECTED Final   Haemophilus influenzae NOT DETECTED NOT DETECTED Final   Neisseria meningitidis NOT DETECTED NOT DETECTED Final   Pseudomonas aeruginosa NOT DETECTED NOT DETECTED Final   Candida albicans NOT DETECTED NOT DETECTED Final   Candida glabrata NOT DETECTED NOT DETECTED Final   Candida krusei NOT DETECTED NOT DETECTED Final   Candida parapsilosis NOT DETECTED NOT DETECTED Final   Candida tropicalis NOT DETECTED NOT DETECTED Final    Comment: Performed at [facility], [place]. [address] [place], [place] [address]  Urine culture     Status: Abnormal   Collection Time: 10/28/19  5:09  AM   Specimen: Urine, Random  Result Value Ref Range Status   Specimen Description URINE, RANDOM  Final   Special Requests NONE  Final   Culture (A)  Final    <10,000 COLONIES/mL INSIGNIFICANT GROWTH Performed at [facility], [place] [address], [place], [place] [address]    Report Status 10/29/2019 FINAL  Final  SARS Coronavirus 2 by RT PCR (hospital order, performed in [facility] hospital lab) Nasopharyngeal Nasopharyngeal Swab     Status: None   Collection Time: 10/28/19  7:37 AM   Specimen: Nasopharyngeal Swab  Result Value Ref Range Status   SARS Coronavirus 2 NEGATIVE NEGATIVE Final    Comment: (NOTE) SARS-CoV-2 target nucleic acids are NOT DETECTED.  The SARS-CoV-2 RNA is generally detectable in upper and lower respiratory specimens during the acute phase of infection. The lowest concentration of SARS-CoV-2 viral copies this assay can detect is 250 copies / mL. A negative result does not preclude SARS-CoV-2 infection and should not be used as the sole basis for treatment or other patient management decisions.  A negative result may occur with improper specimen collection / handling, submission of specimen other than nasopharyngeal swab, presence of viral mutation(s) within the areas targeted by this assay, and inadequate number of viral copies (<250 copies / mL). A negative result must be combined with clinical observations, patient  history, and epidemiological information.  Fact Sheet for Patients:   StrictlyIdeas.no  Fact Sheet for Healthcare Providers: BankingDealers.co.za  This test is not yet approved or  cleared by the [place] FDA and has been authorized for detection and/or diagnosis of SARS-CoV-2 by FDA under an Emergency Use Authorization (EUA).  This EUA will remain in effect (meaning this test can be used) for the duration of the COVID-19 declaration under Section 564(b)(1) of the Act, 21 U.S.C. section  360bbb-3(b)(1), unless the authorization is terminated or revoked sooner.  Performed at [facility], [place] [address] [place], [place] [address]   Culture, blood (routine x 2)     Status: None (Preliminary result)   Collection Time: 10/30/19  4:53 PM   Specimen: BLOOD RIGHT HAND  Result Value Ref Range Status   Specimen Description BLOOD RIGHT HAND  Final   Special Requests   Final    BOTTLES DRAWN AEROBIC AND ANAEROBIC Blood Culture adequate volume   Culture   Final    NO GROWTH 4 DAYS Performed at [facility], [place] [address], [place], [place] [address]    Report Status PENDING  Incomplete  Culture, blood (routine x 2)     Status: None (Preliminary result)   Collection Time: 10/30/19  5:01 PM   Specimen: BLOOD LEFT HAND  Result Value Ref Range Status   Specimen Description BLOOD LEFT HAND  Final   Special Requests   Final    BOTTLES DRAWN AEROBIC AND ANAEROBIC Blood Culture adequate volume   Culture   Final    NO GROWTH 4 DAYS Performed at [facility], [place] [address], [place], [place] [address]    Report Status PENDING  Incomplete  MRSA PCR Screening     Status: None   Collection Time: 11/02/19  5:41 PM   Specimen: Nasal Mucosa; Nasopharyngeal  Result Value Ref Range Status   MRSA by PCR NEGATIVE NEGATIVE Final    Comment:        The GeneXpert MRSA Assay (FDA approved for NASAL specimens only), is one component of a comprehensive MRSA colonization surveillance program. It is not intended to diagnose MRSA infection nor to guide or monitor treatment for MRSA infections. Performed at [facility], [place] [address] [place], [place] [address]   Surgical pcr screen     Status: None   Collection Time: 11/03/19  2:10 AM   Specimen: Nasal Mucosa; Nasal Swab  Result Value Ref Range Status   MRSA, PCR NEGATIVE NEGATIVE Final   Staphylococcus aureus NEGATIVE NEGATIVE Final    Comment: (NOTE) The Xpert SA Assay (FDA approved for NASAL specimens in  patients 38 years of age and older), is one component of a comprehensive surveillance program. It is not intended to diagnose infection nor to guide or monitor treatment. Performed at [facility], [place] [address], [place],  [address]          Radiology Studies: No results found.      Scheduled Meds: . aspirin EC  81 mg Oral Daily  . atorvastatin  40 mg Oral Daily  . dorzolamide-timolol  1 drop Left Eye BID  . heparin  5,000 Units Subcutaneous Q8H  . insulin aspart  0-15 Units Subcutaneous TID WC  . insulin aspart  0-5 Units Subcutaneous QHS  . insulin aspart  3 Units Subcutaneous TID WC  . insulin glargine  35 Units Subcutaneous Daily  . mycophenolate  540 mg Oral BID  .  tacrolimus  6 mg Oral BID   Continuous Infusions: .  ceFAZolin (ANCEF) IV 2 g (11/03/19 0546)  . insulin       LOS: 6 days    Time spent: 25 mins,More than 50% of that time was spent in counseling and/or coordination of care.      Shelly Coss, MD [facility] P7/17/2021, 1:15 PM

## 2019-11-03 NOTE — Progress Notes (Signed)
Patient hospitalized - appt canceled.

## 2019-11-03 NOTE — Brief Op Note (Signed)
11/03/2019  9:12 AM  PATIENT:  Anthony Blanchard  44 y.o. male  PRE-OPERATIVE DIAGNOSIS:  Bilateral diabetic foot ulcers with possible osteomyelitis  POST-OPERATIVE DIAGNOSIS:  Bilateral diabetic foot ulcers with possible osteomyelitis  PROCEDURE:  Procedure(s) with comments: IRRIGATION AND DEBRIDEMENT WOUND (Bilateral) BONE BIOPSY (Bilateral) GRAFT APPLICATION (Bilateral) - Integra bilayer 8x10  SURGEON:  Surgeon(s) and Role:    Dyonna Jaspers, Training and development officer, DPM - Primary  PHYSICIAN ASSISTANT:   ASSISTANTS: none   ANESTHESIA:   MAC  EBL:  30 mL   BLOOD ADMINISTERED:none  DRAINS: none   LOCAL MEDICATIONS USED:  MARCAINE AND LIDOCAINE  SPECIMEN:  Source of Specimen:  Right 5th metatarsal and left 5th metatarsal  DISPOSITION OF SPECIMEN:  PATHOLOGY  COUNTS:  YES  TOURNIQUET:  * No tourniquets in log *  DICTATION: .Note written in EPIC  PLAN OF CARE: Transfer back to Medical Floor 6E  PATIENT DISPOSITION:  PACU - hemodynamically stable.   Delay start of Pharmacological VTE agent (>24hrs) due to surgical blood loss or risk of bleeding: no

## 2019-11-03 NOTE — Anesthesia Procedure Notes (Signed)
Procedure Name: MAC Date/Time: 11/03/2019 7:49 AM Performed by: Kathryne Hitch, CRNA Pre-anesthesia Checklist: Patient identified, Emergency Drugs available, Suction available and Patient being monitored Patient Re-evaluated:Patient Re-evaluated prior to induction Oxygen Delivery Method: Simple face mask Preoxygenation: Pre-oxygenation with 100% oxygen Induction Type: IV induction Placement Confirmation: positive ETCO2 Dental Injury: Teeth and Oropharynx as per pre-operative assessment

## 2019-11-03 NOTE — Op Note (Signed)
DATE OF SURGERY: 11-03-19  PREOPERATIVE DIAGNOSES: 1.  Right and Left foot diabetic foot ulcerations 2.  Probable Osteomyelitis  3.  Diabetes with peripheral neuropathy and PAD 4.  History of Kidney Transplant  POSTOPERATIVE DIAGNOSES:  As above  PROCEDURES PERFORMED: 1.  Right and left foot wound debridement (misonix ultrasound device) 2.  Bone biopsy right and left 5th metatarsals with deep wound cultures 3.  Application of acell micromatrix powder and integra 4x5in bilayer graft, bilateral   SURGEON: Environmental consultant, DPM   ASSISTANT: None  ESTIMATED BLOOD LOSS:  30  HEMOSTASIS:  Right and left ankle tourniquet    ANESTHESIA: Monitored anesthesia care with local  INDICATIONS FOR PROCEDURE: This male patient seen as a hospital consult on several occasions for bilateral diabetic foot ulcerations.  These ulcerations was noted to have extensive soft tissue loss and localized signs of infection discussed with patient need for debridement and the risk of nonhealing due to his poor vascular status.  The risks versus benefits of the procedures have been discussed with the patient in detail by Dr. Cannon Kettle  and the consent is available on the chart for review.  PROCEDURE IN DETAIL: The patient was taken to the operating room via cart and placed on the operative table in supine position and a safety strap was placed across his waist for his protection. Copious amounts of Webril were applied about the left ankle and a pneumatic ankle tourniquet was placed over the Webril.  After adequate IV sedation was administered by the Department of Anesthesia, The right foot was lowered in the operative field and the sterile stockinet was reflected. A one-two pickup was used to test anesthesia, which was found to be adequate. Attention was directed to the 5th metatarsophalangeal joint at the plantar aspect, where there was a noted plantar wound and measured 3 x 2 cm then utilizing Misonix ultrasound  debridement device the plantar right 5th metatarsal wound was debrided to the level of subcutaneous tissue and deep capsule following wound cultures were obtained and then the plantar capsule was reflected using a tenotomy scissor revealing the head of the 5th metatarsal with a rongeur was used to take a small sample of bone to send to pathology.  After sufficient bleeding at this wound site hemostasis was achieved with manual pressure and a Integra bilayer graft was stapled over this wound bed.  Post debridement the right plantar 5th metatarsal wound measured 3.5 x 2 x 1 cm.  At this time attention was directed more proximally at the lateral 5th metatarsal base where there was a wound that measured 0.8 x 0.8 x 0.2 cm predebridement utilizing the Misonix ultrasound device the wound was debrided to the level of subcutaneous tissue to healthy bleeding margins post debridement the wound measured 1 x 1 x 0.2 cm and then to this wound site Integra bilayer graft was placed and secured with staples.  Attention was then directed to the styloid process of the right foot where there is is of note an ulceration that measures 0.6 x 1 x 0.2 cm utilizing ultrasound debridement device the wound was debrided to the level of the subcutaneous tissue to healthy bleeding margins.The wound measured 0.9 x 1.1 x 0.2 cm.  To this wound bed Integra bilayer graft was stapled.  After application of graft to all sites the areas were dressed with Adaptic 4 x 4 Kerlix Coban and Ace wrap.    Attention was directed to the left foot and the sterile stockinette was reflected.  A 1-2 pick up was used to test, anesthesia was was found to be adequate.  Attention was directed to the left lateral foot where there is a 6 x 4 cm full-thickness ulcer noted with a eschar covering.  Using the Misonix ultrasound debridement advised the wound was debrided to the level of tendons and post debridement measures 4 x 7 x 0.5 cm to healthy bleeding margins however  it is of note that there was minimal bleeding noted to this wound bed.  At this time blood cultures were obtained and a small sample of the bone was obtained of the fifth metatarsal utilizing a Jamshidi needle.  Specimens were passed off to be sent to pathology and microbiology.  Graft was applied to the wound bed utilizing ACell powder and Integra bilayer which was secured with staple fixation.  Attention was then directed to the left fibula however there was a small ulceration noted that measured 1.5 x 1.5 cm post debridement this ulceration measures 2 x 1.9 x 0.4 cm and the wound was debrided down to the level of the capsule with healthy granular tissue noted using the ultrasound debridement device.  ACell powder and Integra bilayer with staple to this wound bed. After application of graft to all sites the areas were dressed with Adaptic 4 x 4 Kerlix Coban and Ace wrap.   The patient tolerated the above anesthesia and procedure without complications.  He was transported via cart to the Postanesthesia Care Unit with vital signs stable and vascular status intact to the right and left foot.  Patient to be transferred back to medical floor for continued medical management.  Landis Martins, DPM

## 2019-11-03 NOTE — Anesthesia Postprocedure Evaluation (Signed)
Anesthesia Post Note  Patient: Anthony Blanchard  Procedure(s) Performed: IRRIGATION AND DEBRIDEMENT WOUND (Bilateral Foot) BONE BIOPSY (Bilateral Foot) GRAFT APPLICATION (Bilateral Foot)     Patient location during evaluation: PACU Anesthesia Type: MAC Level of consciousness: awake and alert Pain management: pain level controlled Vital Signs Assessment: post-procedure vital signs reviewed and stable Respiratory status: spontaneous breathing Cardiovascular status: stable Anesthetic complications: no   No complications documented.  Last Vitals:  Vitals:   11/03/19 1649 11/03/19 1951  BP: 110/61 104/70  Pulse: 89 85  Resp: 16 18  Temp: 36.6 C 36.8 C  SpO2: 99% 100%    Last Pain:  Vitals:   11/03/19 1951  TempSrc: Oral  PainSc:                  [name]

## 2019-11-03 NOTE — Transfer of Care (Signed)
Immediate Anesthesia Transfer of Care Note  Patient: Anthony Blanchard  Procedure(s) Performed: IRRIGATION AND DEBRIDEMENT WOUND (Bilateral Foot) BONE BIOPSY (Bilateral Foot) GRAFT APPLICATION (Bilateral Foot)  Patient Location: PACU  Anesthesia Type:MAC  Level of Consciousness: awake, alert  and patient cooperative  Airway & Oxygen Therapy: Patient Spontanous Breathing  Post-op Assessment: Report given to RN and Post -op Vital signs reviewed and stable  Post vital signs: Reviewed and stable  Last Vitals:  Vitals Value Taken Time  BP 129/89 11/03/19 0915  Temp 36.4 C 11/03/19 0915  Pulse 72 11/03/19 0915  Resp 10 11/03/19 0915  SpO2 99 % 11/03/19 0915  Vitals shown include unvalidated device data.  Last Pain:  Vitals:   11/03/19 0459  TempSrc: Oral  PainSc:       Patients Stated Pain Goal: 0 (16/10/96 0454)  Complications: No complications documented.

## 2019-11-04 DIAGNOSIS — E1110 Type 2 diabetes mellitus with ketoacidosis without coma: Secondary | ICD-10-CM | POA: Diagnosis not present

## 2019-11-04 LAB — CULTURE, BLOOD (ROUTINE X 2)
Culture: NO GROWTH
Culture: NO GROWTH
Special Requests: ADEQUATE
Special Requests: ADEQUATE

## 2019-11-04 LAB — CBC WITH DIFFERENTIAL/PLATELET
Abs Immature Granulocytes: 0.14 10*3/uL — ABNORMAL HIGH (ref 0.00–0.07)
Basophils Absolute: 0 10*3/uL (ref 0.0–0.1)
Basophils Relative: 0 %
Eosinophils Absolute: 0 10*3/uL (ref 0.0–0.5)
Eosinophils Relative: 0 %
HCT: 30.5 % — ABNORMAL LOW (ref 39.0–52.0)
Hemoglobin: 9.1 g/dL — ABNORMAL LOW (ref 13.0–17.0)
Immature Granulocytes: 1 %
Lymphocytes Relative: 13 %
Lymphs Abs: 1.5 10*3/uL (ref 0.7–4.0)
MCH: 24.9 pg — ABNORMAL LOW (ref 26.0–34.0)
MCHC: 29.8 g/dL — ABNORMAL LOW (ref 30.0–36.0)
MCV: 83.6 fL (ref 80.0–100.0)
Monocytes Absolute: 1.1 10*3/uL — ABNORMAL HIGH (ref 0.1–1.0)
Monocytes Relative: 10 %
Neutro Abs: 8.3 10*3/uL — ABNORMAL HIGH (ref 1.7–7.7)
Neutrophils Relative %: 76 %
Platelets: 207 10*3/uL (ref 150–400)
RBC: 3.65 MIL/uL — ABNORMAL LOW (ref 4.22–5.81)
RDW: 15.5 % (ref 11.5–15.5)
WBC: 10.9 10*3/uL — ABNORMAL HIGH (ref 4.0–10.5)
nRBC: 0 % (ref 0.0–0.2)

## 2019-11-04 LAB — BASIC METABOLIC PANEL
Anion gap: 7 (ref 5–15)
BUN: 17 mg/dL (ref 6–20)
CO2: 24 mmol/L (ref 22–32)
Calcium: 9.2 mg/dL (ref 8.9–10.3)
Chloride: 101 mmol/L (ref 98–111)
Creatinine, Ser: 1.36 mg/dL — ABNORMAL HIGH (ref 0.61–1.24)
Glucose, Bld: 148 mg/dL — ABNORMAL HIGH (ref 70–99)
Potassium: 4.1 mmol/L (ref 3.5–5.1)
Sodium: 132 mmol/L — ABNORMAL LOW (ref 135–145)

## 2019-11-04 LAB — GLUCOSE, CAPILLARY
Glucose-Capillary: 133 mg/dL — ABNORMAL HIGH (ref 70–99)
Glucose-Capillary: 72 mg/dL (ref 70–99)
Glucose-Capillary: 160 mg/dL — ABNORMAL HIGH (ref 70–99)
Glucose-Capillary: 166 mg/dL — ABNORMAL HIGH (ref 70–99)

## 2019-11-04 MED ORDER — PROCHLORPERAZINE EDISYLATE 10 MG/2ML IJ SOLN
10.0000 mg | Freq: Once | INTRAMUSCULAR | Status: AC
  Administered 2019-11-04: 10 mg via INTRAVENOUS
  Filled 2019-11-04: qty 2

## 2019-11-04 MED ORDER — DIPHENHYDRAMINE HCL 50 MG/ML IJ SOLN
50.0000 mg | Freq: Once | INTRAMUSCULAR | Status: AC
  Administered 2019-11-04: 50 mg via INTRAVENOUS
  Filled 2019-11-04: qty 1

## 2019-11-04 MED ORDER — KETOROLAC TROMETHAMINE 15 MG/ML IJ SOLN
15.0000 mg | Freq: Once | INTRAMUSCULAR | Status: AC
  Administered 2019-11-04: 15 mg via INTRAVENOUS
  Filled 2019-11-04: qty 1

## 2019-11-04 NOTE — Progress Notes (Signed)
Orthopedic Tech Progress Note Patient Details:  Anthony Blanchard 04-06-1976 [id number]  Ortho Devices Type of Ortho Device: Postop shoe/boot Ortho Device/Splint Location: BLE Ortho Device/Splint Interventions: Ordered, Application   Post Interventions Patient Tolerated: Well Instructions Provided: Care of device, Adjustment of device, Poper ambulation with device   Anthony Blanchard 11/04/2019, 12:48 PM

## 2019-11-04 NOTE — Progress Notes (Signed)
PROGRESS NOTE    Anthony Blanchard  XAJ:[id number] DOB: 1975-08-09 DOA: 10/28/2019 PCP: Pleas Koch, NP   Brief Narrative: Patient is a 44 year old male with history of diabetes type 2, ESRD status post kidney transplant 2016 at [facility], legally blind, CKD with baseline creatinine around 1.5-1.7, GERD, coronary artery disease who presented to the emergency department after a fall with abrasion to the shoulder, laceration of the lip and abrasion to the forehead.  He was complaining of generalized weakness, feeling disoriented, no loss of consciousness though.  He has poor compliance with insulin.  Patient also has ulcers on his bilateral feet.  On presentation he was found to have severe hyperglycemia with ketones in the urine.  He was admitted for the management of DKA, acute kidney injury and sepsis due to foot ulcers.  Podiatry, vascular surgery consulted.  Underwent Right and left foot wound debridement and bone biopsy right and left 5th metatarsal with deep wound cultures on 11/03/19.  Assessment & Plan:   Principal Problem:   DKA (diabetic ketoacidosis) ([place]) Active Problems:   Kidney transplanted   Hyperlipidemia   Sepsis ([place])   Essential hypertension   Hyperkalemia   CKD (chronic kidney disease) stage 4, GFR 15-29 ml/min ([facility])   Diabetic foot ulcer ([place])   DKA (diabetic ketoacidoses) ([place])  DKA: Secondary to poor compliance with insulin and concurrent foot infection.  Insulin drip was stopped and now on Lantus and sliding scale.  Hemoglobin A1c of 12.1.  Diabetic coordinator was following.  Sepsis/bilateral diabetic foot ulcers: Has chronic bilateral foot ulcers.  ABI suggested diminished circulation in the lower extremities.  Vascular surgery consulted,no plan for revascularization or amputation  Continue antibiotic Ancef.  X-rays of the both feet indicating some concern for osteomyelitis.  MRI is suspicious of   early osteomyelitis of the lateral plantar base of the fifth  metatarsal .  Underwent Right and left foot wound debridement and bone biopsy right and left 5th metatarsal with deep wound cultures on 11/03/19.  Wound culture showing mixed organisms, pending final report  Streptococcus agalactiae bacteremia: Seen on one of the 2 blood culture bottles.  Currently on Ancef.  Repeat blood cultures have been sent and they are negative.  Will discuss with ID at some point regarding duration of antibiotics.  AKI on stage IIIb CKD/renal transplant/hyperkalemia: Baseline creatinine around 1.5-1.7.  Presented with creatinine of 2.8 now closer to baseline.  Continue Myfortic and Prograf.  Hyperkalemia has resolved.  Coronary artery disease: Patient is status post PCI to RCA in 2009 and PCI to LAD in 2013  Encephalopathy: Metabolic encephalopathy in the setting of DKA, AKI, sepsis.  Currently alert and oriented.  Generalized weakness/debility/deconditioning: PT consulted.  Recommended home health on discharge.  Headache: Given migraine cocktail.         DVT prophylaxis:Heparin [place] Code Status: Full Family Communication: None present at bedside Status is: Inpatient  Remains inpatient appropriate because:IV treatments appropriate due to intensity of illness or inability to take PO   Dispo: The patient is from: Home              Anticipated d/c is to: Home              Anticipated d/c date is: 1-2 days              Patient currently is not medically stable to d/c.  Waiting for clearance from podiatry.   Consultants: Vascular surgery, podiatry  Procedures: None  Antimicrobials:  Anti-infectives (From  admission, onward)   Start     Dose/Rate Route Frequency Ordered Stop   10/29/19 1400  ceFAZolin (ANCEF) IVPB 2g/100 mL premix     Discontinue     2 g 200 mL/hr over 30 Minutes Intravenous Every 8 hours 10/29/19 0906     10/29/19 0700  vancomycin (VANCOCIN) IVPB 1000 mg/200 mL premix  Status:  Discontinued        1,000 mg 200 mL/hr over 60 Minutes  Intravenous Every 24 hours 10/28/19 0625 10/28/19 0751   10/28/19 0815  metroNIDAZOLE (FLAGYL) IVPB 500 mg  Status:  Discontinued        500 mg 100 mL/hr over 60 Minutes Intravenous Every 8 hours 10/28/19 0814 10/28/19 1126   10/28/19 0700  ceFEPIme (MAXIPIME) 2 g in sodium chloride 0.9 % 100 mL IVPB  Status:  Discontinued        2 g 200 mL/hr over 30 Minutes Intravenous Every 24 hours 10/28/19 0625 10/29/19 0906   10/28/19 0630  vancomycin (VANCOCIN) IVPB 1000 mg/200 mL premix  Status:  Discontinued        1,000 mg 200 mL/hr over 60 Minutes Intravenous  Once 10/28/19 0620 10/28/19 0757   10/28/19 0415  vancomycin (VANCOCIN) IVPB 1000 mg/200 mL premix        1,000 mg 200 mL/hr over 60 Minutes Intravenous  Once 10/28/19 0408 10/28/19 0649   10/28/19 0415  piperacillin-tazobactam (ZOSYN) IVPB 3.375 g        3.375 g 12.5 mL/hr over 240 Minutes Intravenous  Once 10/28/19 0408 10/28/19 0804      Subjective: Patient seen and examined at the bedside this morning.  Hemodynamically stable.  Complains of severe right-sided headache.  Objective: Vitals:   11/03/19 1649 11/03/19 1951 11/04/19 0533 11/04/19 0823  BP: 110/61 104/70 124/64 (!) 144/69  Pulse: 89 85 87 92  Resp: 16 18  18   Temp: 97.9 F (36.6 C) 98.2 F (36.8 C) 99.4 F (37.4 C) 99.2 F (37.3 C)  TempSrc: Oral Oral Oral Oral  SpO2: 99% 100%    Weight:   99.6 kg   Height:        Intake/Output Summary (Last 24 hours) at 11/04/2019 0849 Last data filed at 11/04/2019 0823 Gross per 24 hour  Intake 1471.5 ml  Output 1980 ml  Net -508.5 ml   Filed Weights   11/02/19 0426 11/03/19 0459 11/04/19 0533  Weight: 92.2 kg 92.2 kg 99.6 kg    Examination:  General exam: In moderate distress due to headache, obese HEENT: Absence of right eye,legally blind Respiratory system: Bilateral equal air entry, normal vesicular breath sounds, no wheezes or crackles  Cardiovascular system: S1 & S2 heard, RRR. No JVD, murmurs, rubs, gallops  or clicks. Gastrointestinal system: Abdomen is nondistended, soft and nontender. No organomegaly or masses felt. Normal bowel sounds heard. Central nervous system: Alert and oriented. No focal neurological deficits. Extremities: No edema, no clubbing ,no cyanosis, legs wrapped with dressings      Data Reviewed: I have personally reviewed following labs and imaging studies  CBC: Recent Labs  Lab 10/29/19 0416 10/30/19 0429 10/31/19 0525 11/02/19 0335 11/04/19 0553  WBC 17.6* 14.6* 12.7* 13.9* 10.9*  NEUTROABS  --   --   --  10.8* 8.3*  HGB 10.6* 10.1* 9.7* 9.6* 9.1*  HCT 35.9* 32.9* 32.5* 32.6* 30.5*  MCV 84.9 84.6 85.3 83.6 83.6  PLT 285 236 190 183 601   Basic Metabolic Panel: Recent Labs  Lab 10/29/19 0416 10/30/19  6384 10/31/19 0525 11/02/19 0335 11/04/19 0553  NA 141 135 132* 130* 132*  K 4.6 4.0 3.8 4.0 4.1  CL 109 104 100 100 101  CO2 20* 23 21* 21* 24  GLUCOSE 362* 163* 174* 133* 148*  BUN 32* 21* 22* 22* 17  CREATININE 1.66* 1.32* 1.37* 1.42* 1.36*  CALCIUM 10.3 9.4 9.0 9.0 9.2   GFR: Estimated Creatinine Clearance: 81.5 mL/min (A) (by C-G formula based on SCr of 1.36 mg/dL (H)). Liver Function Tests: No results for input(s): AST, ALT, ALKPHOS, BILITOT, PROT, ALBUMIN in the last 168 hours. No results for input(s): LIPASE, AMYLASE in the last 168 hours. No results for input(s): AMMONIA in the last 168 hours. Coagulation Profile: Recent Labs  Lab 10/28/19 1259  INR 1.3*   Cardiac Enzymes: No results for input(s): CKTOTAL, CKMB, CKMBINDEX, TROPONINI in the last 168 hours. BNP (last 3 results) No results for input(s): PROBNP in the last 8760 hours. HbA1C: No results for input(s): HGBA1C in the last 72 hours. CBG: Recent Labs  Lab 11/03/19 0917 11/03/19 1009 11/03/19 1643 11/03/19 2102 11/04/19 0740  GLUCAP 180* 188* 151* 124* 133*   Lipid Profile: No results for input(s): CHOL, HDL, LDLCALC, TRIG, CHOLHDL, LDLDIRECT in the last 72  hours. Thyroid Function Tests: No results for input(s): TSH, T4TOTAL, FREET4, T3FREE, THYROIDAB in the last 72 hours. Anemia Panel: No results for input(s): VITAMINB12, FOLATE, FERRITIN, TIBC, IRON, RETICCTPCT in the last 72 hours. Sepsis Labs: No results for input(s): PROCALCITON, LATICACIDVEN in the last 168 hours.  Recent Results (from the past 240 hour(s))  Blood culture (routine x 2)     Status: None   Collection Time: 10/28/19  4:55 AM   Specimen: BLOOD RIGHT ARM  Result Value Ref Range Status   Specimen Description BLOOD RIGHT ARM  Final   Special Requests   Final    BOTTLES DRAWN AEROBIC AND ANAEROBIC Blood Culture adequate volume   Culture   Final    NO GROWTH 5 DAYS Performed at [facility], [address], [place], [place] [address]    Report Status 11/02/2019 FINAL  Final  Blood culture (routine x 2)     Status: Abnormal   Collection Time: 10/28/19  5:02 AM   Specimen: BLOOD LEFT ARM  Result Value Ref Range Status   Specimen Description BLOOD LEFT ARM  Final   Special Requests   Final    BOTTLES DRAWN AEROBIC AND ANAEROBIC Blood Culture adequate volume   Culture  Setup Time   Final    GRAM POSITIVE COCCI IN CHAINS AEROBIC BOTTLE ONLY CRITICAL RESULT CALLED TO, READ BACK BY AND VERIFIED WITH: PHARMD LAURA SEAY AT2252 BY MESSAN H. ON 10/28/2019 Performed at [facility], [place] [address], [place], [place] [address]    Culture GROUP B STREP(S.AGALACTIAE)ISOLATED (A)  Final   Report Status 10/31/2019 FINAL  Final   Organism ID, Bacteria GROUP B STREP(S.AGALACTIAE)ISOLATED  Final      Susceptibility   Group b strep(s.agalactiae)isolated - MIC*    CLINDAMYCIN <=0.25 SENSITIVE Sensitive     AMPICILLIN <=0.25 SENSITIVE Sensitive     ERYTHROMYCIN <=0.12 SENSITIVE Sensitive     VANCOMYCIN 0.5 SENSITIVE Sensitive     CEFTRIAXONE <=0.12 SENSITIVE Sensitive     LEVOFLOXACIN 1 SENSITIVE Sensitive     PENICILLIN Value in next row Sensitive      SENSITIVE<=0.06     * GROUP B STREP(S.AGALACTIAE)ISOLATED  Blood Culture ID Panel (Reflexed)  Status: Abnormal   Collection Time: 10/28/19  5:02 AM  Result Value Ref Range Status   Enterococcus species NOT DETECTED NOT DETECTED Final   Listeria monocytogenes NOT DETECTED NOT DETECTED Final   Staphylococcus species NOT DETECTED NOT DETECTED Final   Staphylococcus aureus (BCID) NOT DETECTED NOT DETECTED Final   Streptococcus species DETECTED (A) NOT DETECTED Final    Comment: CRITICAL RESULT CALLED TO, READ BACK BY AND VERIFIED WITH: PHARMD LAURA SEAY AT 2252 BY MESSAN H. ON  10/28/2019    Streptococcus agalactiae DETECTED (A) NOT DETECTED Final    Comment: CRITICAL RESULT CALLED TO, READ BACK BY AND VERIFIED WITH: PHARMD LAURA SEAY AT 2252 BY MESSAN H. ON  10/28/2019    Streptococcus pneumoniae NOT DETECTED NOT DETECTED Final   Streptococcus pyogenes NOT DETECTED NOT DETECTED Final   Acinetobacter baumannii NOT DETECTED NOT DETECTED Final   Enterobacteriaceae species NOT DETECTED NOT DETECTED Final   Enterobacter cloacae complex NOT DETECTED NOT DETECTED Final   Escherichia coli NOT DETECTED NOT DETECTED Final   Klebsiella oxytoca NOT DETECTED NOT DETECTED Final   Klebsiella pneumoniae NOT DETECTED NOT DETECTED Final   Proteus species NOT DETECTED NOT DETECTED Final   Serratia marcescens NOT DETECTED NOT DETECTED Final   Haemophilus influenzae NOT DETECTED NOT DETECTED Final   Neisseria meningitidis NOT DETECTED NOT DETECTED Final   Pseudomonas aeruginosa NOT DETECTED NOT DETECTED Final   Candida albicans NOT DETECTED NOT DETECTED Final   Candida glabrata NOT DETECTED NOT DETECTED Final   Candida krusei NOT DETECTED NOT DETECTED Final   Candida parapsilosis NOT DETECTED NOT DETECTED Final   Candida tropicalis NOT DETECTED NOT DETECTED Final    Comment: Performed at [facility], [address] [place], [place] [address]  Urine culture     Status: Abnormal   Collection Time: 10/28/19  5:09 AM    Specimen: Urine, Random  Result Value Ref Range Status   Specimen Description URINE, RANDOM  Final   Special Requests NONE  Final   Culture (A)  Final    <10,000 COLONIES/mL INSIGNIFICANT GROWTH Performed at [facility], [place] [address] [place], [place] [address]    Report Status 10/29/2019 FINAL  Final  SARS Coronavirus 2 by RT PCR (hospital order, performed in [facility] hospital lab) Nasopharyngeal Nasopharyngeal Swab     Status: None   Collection Time: 10/28/19  7:37 AM   Specimen: Nasopharyngeal Swab  Result Value Ref Range Status   SARS Coronavirus 2 NEGATIVE NEGATIVE Final    Comment: (NOTE) SARS-CoV-2 target nucleic acids are NOT DETECTED.  The SARS-CoV-2 RNA is generally detectable in upper and lower respiratory specimens during the acute phase of infection. The lowest concentration of SARS-CoV-2 viral copies this assay can detect is 250 copies / mL. A negative result does not preclude SARS-CoV-2 infection and should not be used as the sole basis for treatment or other patient management decisions.  A negative result may occur with improper specimen collection / handling, submission of specimen other than nasopharyngeal swab, presence of viral mutation(s) within the areas targeted by this assay, and inadequate number of viral copies (<250 copies / mL). A negative result must be combined with clinical observations, patient history, and epidemiological information.  Fact Sheet for Patients:   StrictlyIdeas.no  Fact Sheet for Healthcare Providers: BankingDealers.co.za  This test is not yet approved or  cleared by the [place] FDA and has been authorized for detection and/or diagnosis of SARS-CoV-2 by FDA  under an Emergency Use Authorization (EUA).  This EUA will remain in effect (meaning this test can be used) for the duration of the COVID-19 declaration under Section 564(b)(1) of the Act, 21 U.S.C. section  360bbb-3(b)(1), unless the authorization is terminated or revoked sooner.  Performed at [facility], [place] [address] [place], [place] [address]   Culture, blood (routine x 2)     Status: None (Preliminary result)   Collection Time: 10/30/19  4:53 PM   Specimen: BLOOD RIGHT HAND  Result Value Ref Range Status   Specimen Description BLOOD RIGHT HAND  Final   Special Requests   Final    BOTTLES DRAWN AEROBIC AND ANAEROBIC Blood Culture adequate volume   Culture   Final    NO GROWTH 4 DAYS Performed at [facility], [place] [address], [place], [place] [address]    Report Status PENDING  Incomplete  Culture, blood (routine x 2)     Status: None (Preliminary result)   Collection Time: 10/30/19  5:01 PM   Specimen: BLOOD LEFT HAND  Result Value Ref Range Status   Specimen Description BLOOD LEFT HAND  Final   Special Requests   Final    BOTTLES DRAWN AEROBIC AND ANAEROBIC Blood Culture adequate volume   Culture   Final    NO GROWTH 4 DAYS Performed at [facility], [place] [address] [place], [place] [address]    Report Status PENDING  Incomplete  MRSA PCR Screening     Status: None   Collection Time: 11/02/19  5:41 PM   Specimen: Nasal Mucosa; Nasopharyngeal  Result Value Ref Range Status   MRSA by PCR NEGATIVE NEGATIVE Final    Comment:        The GeneXpert MRSA Assay (FDA approved for NASAL specimens only), is one component of a comprehensive MRSA colonization surveillance program. It is not intended to diagnose MRSA infection nor to guide or monitor treatment for MRSA infections. Performed at [facility], [place] [address], [place], [place] [address]   Surgical pcr screen     Status: None   Collection Time: 11/03/19  2:10 AM   Specimen: Nasal Mucosa; Nasal Swab  Result Value Ref Range Status   MRSA, PCR NEGATIVE NEGATIVE Final   Staphylococcus aureus NEGATIVE NEGATIVE Final    Comment: (NOTE) The Xpert SA Assay (FDA approved for NASAL specimens in  patients 47 years of age and older), is one component of a comprehensive surveillance program. It is not intended to diagnose infection nor to guide or monitor treatment. Performed at [facility], [place] [address], [place], [place] [address]   Aerobic/Anaerobic Culture (surgical/deep wound)     Status: None (Preliminary result)   Collection Time: 11/03/19  8:16 AM   Specimen: Wound  Result Value Ref Range Status   Specimen Description WOUND RIGHT FOOT  Final   Special Requests NONE  Final   Gram Stain   Final    RARE WBC PRESENT, PREDOMINANTLY PMN FEW GRAM POSITIVE COCCI IN PAIRS Performed at [facility], [place] [address], [place], [place] [address]    Culture PENDING  Incomplete   Report Status PENDING  Incomplete  Aerobic/Anaerobic Culture (surgical/deep wound)     Status: None (Preliminary result)   Collection Time: 11/03/19  8:39 AM   Specimen: Wound  Result Value Ref Range Status   Specimen Description WOUND LEFT FOOT  Final   Special Requests NONE  Final   Gram  Stain   Final    RARE WBC PRESENT, PREDOMINANTLY PMN ABUNDANT GRAM POSITIVE COCCI IN PAIRS IN CLUSTERS FEW GRAM NEGATIVE RODS Performed at [facility], [place] [address] [place], [place] [address]    Culture PENDING  Incomplete   Report Status PENDING  Incomplete         Radiology Studies: No results found.      Scheduled Meds: . aspirin EC  81 mg Oral Daily  . atorvastatin  40 mg Oral Daily  . dorzolamide-timolol  1 drop Left Eye BID  . heparin  5,000 Units Subcutaneous Q8H  . insulin aspart  0-15 Units Subcutaneous TID WC  . insulin aspart  0-5 Units Subcutaneous QHS  . insulin aspart  3 Units Subcutaneous TID WC  . insulin glargine  35 Units Subcutaneous Daily  . mycophenolate  540 mg Oral BID  . tacrolimus  6 mg Oral BID   Continuous Infusions: .  ceFAZolin (ANCEF) IV Stopped (11/04/19 7207)  . insulin       LOS: 7 days    Time spent: 25 mins,More than 50% of that time  was spent in counseling and/or coordination of care.      Shelly Coss, MD [facility] P7/18/2021, 8:49 AM

## 2019-11-04 NOTE — Progress Notes (Signed)
Subjective: Anthony Blanchard is a 44 y.o. male patient seen at bedside, resting comfortably in no acute distress s/p bilateral wound debridement, bone biopsy, and placement of graft performed on 11/03/2019.  Patient denies pain at surgical site, but does admit a headache, denies calf pain,  chest pain, shortness of breath, nausea, vomitting, denies loss of appetite, denies problems with voiding. No other issues noted.   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   Immunosuppressed status ([place]) 12/12/2014   LVH (left ventricular hypertrophy) due to hypertensive disease 12/12/2014   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/10/2014   Diabetes mellitus ([place]) 01/05/2011   Hypertension, benign 12/25/2009     Current Facility-Administered Medications:    acetaminophen (TYLENOL) tablet 650 mg, 650 mg, Oral, Q6H PRN, Tawanna Solo, Amrit, MD, 650 mg at 11/04/19 0836   aspirin EC tablet 81 mg, 81 mg, Oral, Daily, Chotiner, Yevonne Aline, MD, 81 mg at 11/04/19 0836   atorvastatin (LIPITOR) tablet 40 mg, 40 mg, Oral, Daily, Chotiner, Yevonne Aline, MD, 40 mg at 11/04/19 0837   ceFAZolin (ANCEF) IVPB 2g/100 mL premix, 2 g, Intravenous, Q8H, Domenic Polite, MD, Stopped at 11/04/19 0658   dextrose 50 % solution 0-50 mL, 0-50 mL, Intravenous, PRN, Veryl Speak, MD   dorzolamide-timolol (COSOPT) 22.3-6.8 MG/ML ophthalmic solution 1 drop, 1 drop, Left Eye, BID, Chotiner, Yevonne Aline, MD, 1 drop at 11/04/19 0837   heparin injection 5,000 Units, 5,000 Units, Subcutaneous, Q8H, Chotiner, Yevonne Aline, MD, 5,000 Units at 11/04/19 0529   insulin aspart (novoLOG) injection 0-15 Units, 0-15 Units, Subcutaneous, TID WC, Domenic Polite, MD, 2 Units at 11/04/19 0827   insulin aspart (novoLOG) injection 0-5 Units, 0-5 Units, Subcutaneous, QHS, Domenic Polite, MD, 2 Units at 10/28/19 2139   insulin aspart (novoLOG) injection 3 Units, 3 Units, Subcutaneous, TID WC, Domenic Polite, MD, 3 Units at 11/04/19 0827   insulin glargine (LANTUS) injection 35 Units, 35 Units, Subcutaneous, Daily, Domenic Polite, MD, 35 Units at 11/04/19 0834   insulin regular, human (MYXREDLIN) 100 units/ 100 mL infusion, , Intravenous, Continuous, Chotiner, Yevonne Aline, MD   LORazepam (ATIVAN) injection 1 mg, 1 mg, Intravenous, Q30 min PRN, Domenic Polite, MD, 1 mg at 10/30/19 1219   morphine 2 MG/ML injection 2 mg, 2 mg, Intravenous, Q4H PRN, Adhikari, Amrit, MD   mycophenolate (MYFORTIC) EC tablet 540 mg, 540 mg, Oral, BID, Domenic Polite, MD, 540 mg at 11/04/19 0836   ondansetron [facility]) injection 4 mg, 4 mg, Intravenous, Q6H PRN, Domenic Polite, MD, 4 mg at 11/01/19 0622   tacrolimus (PROGRAF) capsule 6 mg, 6 mg, Oral, BID, Domenic Polite, MD, 6 mg at 11/04/19 9024  Allergies  Allergen Reactions   Shellfish Allergy Anaphylaxis   Shellfish-Derived Products Anaphylaxis   Sulfa Antibiotics Anaphylaxis    Other reaction(s): Other (See Comments)   Iodinated Diagnostic Agents Other (See Comments)    Other reaction(s): Other (see comments)   Iodine Other (See Comments)    Other reaction(s): Other (see comments)  Objective: Today's Vitals   11/03/19 1951 11/03/19 2015 11/04/19 0533 11/04/19 0823  BP: 104/70  124/64 (!) 144/69  Pulse: 85  87 92  Resp: 18   18  Temp: 98.2 F (36.8 C)  99.4 F (37.4 C) 99.2 F (37.3 C)  TempSrc: Oral  Oral Oral  SpO2: 100%     Weight:   99.6 kg    Height:      PainSc:  0-No pain  6     General: No acute distress Left/Right Lower extremity: Dressings clean, dry, intact. No strikethrough noted, upon removal graft sites appear to be intact secured with staples with mild bloody drainage, no edema, no erythema, no warmth, no malodor or any acute signs of infection at the graft sites.  Neurovascular status unchanged from prior.  No pain to palpation to foot or calf bilateral.   Left foot   Right foot  CBC    Component Value Date/Time   WBC 10.9 (H) 11/04/2019 0553   RBC 3.65 (L) 11/04/2019 0553   HGB 9.1 (L) 11/04/2019 0553   HCT 30.5 (L) 11/04/2019 0553   PLT 207 11/04/2019 0553   MCV 83.6 11/04/2019 0553   MCH 24.9 (L) 11/04/2019 0553   MCHC 29.8 (L) 11/04/2019 0553   RDW 15.5 11/04/2019 0553   LYMPHSABS 1.5 11/04/2019 0553   MONOABS 1.1 (H) 11/04/2019 0553   EOSABS 0.0 11/04/2019 0553   BASOSABS 0.0 11/04/2019 0553   Results for orders placed or performed during the hospital encounter of 10/28/19  Blood culture (routine x 2)     Status: None   Collection Time: 10/28/19  4:55 AM   Specimen: BLOOD RIGHT ARM  Result Value Ref Range Status   Specimen Description BLOOD RIGHT ARM  Final   Special Requests   Final    BOTTLES DRAWN AEROBIC AND ANAEROBIC Blood Culture adequate volume   Culture   Final    NO GROWTH 5 DAYS Performed at [facility], [address] [place], [place] [address]    Report Status 11/02/2019 FINAL  Final  Blood culture (routine x 2)     Status: Abnormal   Collection Time: 10/28/19  5:02 AM   Specimen: BLOOD LEFT ARM  Result Value Ref Range Status   Specimen Description BLOOD LEFT ARM  Final   Special Requests   Final    BOTTLES DRAWN AEROBIC AND ANAEROBIC Blood Culture adequate volume   Culture  Setup Time   Final    GRAM POSITIVE COCCI IN CHAINS AEROBIC BOTTLE ONLY CRITICAL RESULT CALLED TO, READ BACK BY AND VERIFIED WITH: PHARMD LAURA SEAY AT2252 BY MESSAN H. ON 10/28/2019 Performed  at [facility], [place] [address], [place], [place] [address]    Culture GROUP B STREP(S.AGALACTIAE)ISOLATED (A)  Final   Report Status 10/31/2019 FINAL  Final   Organism ID, Bacteria GROUP B STREP(S.AGALACTIAE)ISOLATED  Final      Susceptibility   Group b strep(s.agalactiae)isolated - MIC*    CLINDAMYCIN <=0.25 SENSITIVE Sensitive     AMPICILLIN <=0.25 SENSITIVE Sensitive     ERYTHROMYCIN <=0.12 SENSITIVE Sensitive     VANCOMYCIN 0.5 SENSITIVE Sensitive     CEFTRIAXONE <=0.12 SENSITIVE Sensitive     LEVOFLOXACIN 1 SENSITIVE Sensitive     PENICILLIN Value in next row Sensitive      SENSITIVE<=0.06    * GROUP B STREP(S.AGALACTIAE)ISOLATED  Blood Culture ID Panel (Reflexed)     Status: Abnormal   Collection Time: 10/28/19  5:02  AM  Result Value Ref Range Status   Enterococcus species NOT DETECTED NOT DETECTED Final   Listeria monocytogenes NOT DETECTED NOT DETECTED Final   Staphylococcus species NOT DETECTED NOT DETECTED Final   Staphylococcus aureus (BCID) NOT DETECTED NOT DETECTED Final   Streptococcus species DETECTED (A) NOT DETECTED Final    Comment: CRITICAL RESULT CALLED TO, READ BACK BY AND VERIFIED WITH: PHARMD LAURA SEAY AT 2252 BY MESSAN H. ON  10/28/2019    Streptococcus agalactiae DETECTED (A) NOT DETECTED Final    Comment: CRITICAL RESULT CALLED TO, READ BACK BY AND VERIFIED WITH: PHARMD LAURA SEAY AT 2252 BY MESSAN H. ON  10/28/2019    Streptococcus pneumoniae NOT DETECTED NOT DETECTED Final   Streptococcus pyogenes NOT DETECTED NOT DETECTED Final   Acinetobacter baumannii NOT DETECTED NOT DETECTED Final   Enterobacteriaceae species NOT DETECTED NOT DETECTED Final   Enterobacter cloacae complex NOT DETECTED NOT DETECTED Final   Escherichia coli NOT DETECTED NOT DETECTED Final   Klebsiella oxytoca NOT DETECTED NOT DETECTED Final   Klebsiella pneumoniae NOT DETECTED NOT DETECTED Final   Proteus species NOT DETECTED NOT DETECTED Final   Serratia marcescens NOT  DETECTED NOT DETECTED Final   Haemophilus influenzae NOT DETECTED NOT DETECTED Final   Neisseria meningitidis NOT DETECTED NOT DETECTED Final   Pseudomonas aeruginosa NOT DETECTED NOT DETECTED Final   Candida albicans NOT DETECTED NOT DETECTED Final   Candida glabrata NOT DETECTED NOT DETECTED Final   Candida krusei NOT DETECTED NOT DETECTED Final   Candida parapsilosis NOT DETECTED NOT DETECTED Final   Candida tropicalis NOT DETECTED NOT DETECTED Final    Comment: Performed at [facility], [address] [place], [place] [address]  Urine culture     Status: Abnormal   Collection Time: 10/28/19  5:09 AM   Specimen: Urine, Random  Result Value Ref Range Status   Specimen Description URINE, RANDOM  Final   Special Requests NONE  Final   Culture (A)  Final    <10,000 COLONIES/mL INSIGNIFICANT GROWTH Performed at [facility], [place] [address] [place], [place] [address]    Report Status 10/29/2019 FINAL  Final  SARS Coronavirus 2 by RT PCR (hospital order, performed in [facility] hospital lab) Nasopharyngeal Nasopharyngeal Swab     Status: None   Collection Time: 10/28/19  7:37 AM   Specimen: Nasopharyngeal Swab  Result Value Ref Range Status   SARS Coronavirus 2 NEGATIVE NEGATIVE Final    Comment: (NOTE) SARS-CoV-2 target nucleic acids are NOT DETECTED.  The SARS-CoV-2 RNA is generally detectable in upper and lower respiratory specimens during the acute phase of infection. The lowest concentration of SARS-CoV-2 viral copies this assay can detect is 250 copies / mL. A negative result does not preclude SARS-CoV-2 infection and should not be used as the sole basis for treatment or other patient management decisions.  A negative result may occur with improper specimen collection / handling, submission of specimen other than nasopharyngeal swab, presence of viral mutation(s) within the areas targeted by this assay, and inadequate number of viral copies (<250 copies / mL).  A negative result must be combined with clinical observations, patient history, and epidemiological information.  Fact Sheet for Patients:   StrictlyIdeas.no  Fact Sheet for Healthcare Providers: BankingDealers.co.za  This test is not yet approved or  cleared by the [place] FDA and has been authorized for detection and/or diagnosis of SARS-CoV-2 by FDA under an Emergency Use Authorization (EUA).  This  EUA will remain in effect (meaning this test can be used) for the duration of the COVID-19 declaration under Section 564(b)(1) of the Act, 21 U.S.C. section 360bbb-3(b)(1), unless the authorization is terminated or revoked sooner.  Performed at [facility], [place] [address], [place], [place] [address]   Culture, blood (routine x 2)     Status: None (Preliminary result)   Collection Time: 10/30/19  4:53 PM   Specimen: BLOOD RIGHT HAND  Result Value Ref Range Status   Specimen Description BLOOD RIGHT HAND  Final   Special Requests   Final    BOTTLES DRAWN AEROBIC AND ANAEROBIC Blood Culture adequate volume   Culture   Final    NO GROWTH 4 DAYS Performed at [facility], [place] [address] [place], [place] [address]    Report Status PENDING  Incomplete  Culture, blood (routine x 2)     Status: None (Preliminary result)   Collection Time: 10/30/19  5:01 PM   Specimen: BLOOD LEFT HAND  Result Value Ref Range Status   Specimen Description BLOOD LEFT HAND  Final   Special Requests   Final    BOTTLES DRAWN AEROBIC AND ANAEROBIC Blood Culture adequate volume   Culture   Final    NO GROWTH 4 DAYS Performed at [facility], [place] [address], [place], [place] [address]    Report Status PENDING  Incomplete  MRSA PCR Screening     Status: None   Collection Time: 11/02/19  5:41 PM   Specimen: Nasal Mucosa; Nasopharyngeal  Result Value Ref Range Status   MRSA by PCR NEGATIVE NEGATIVE Final    Comment:        The GeneXpert  MRSA Assay (FDA approved for NASAL specimens only), is one component of a comprehensive MRSA colonization surveillance program. It is not intended to diagnose MRSA infection nor to guide or monitor treatment for MRSA infections. Performed at [facility], [place] [address] [place], [place] [address]   Surgical pcr screen     Status: None   Collection Time: 11/03/19  2:10 AM   Specimen: Nasal Mucosa; Nasal Swab  Result Value Ref Range Status   MRSA, PCR NEGATIVE NEGATIVE Final   Staphylococcus aureus NEGATIVE NEGATIVE Final    Comment: (NOTE) The Xpert SA Assay (FDA approved for NASAL specimens in patients 75 years of age and older), is one component of a comprehensive surveillance program. It is not intended to diagnose infection nor to guide or monitor treatment. Performed at [facility], [place] [address] [place], [place] [address]   Aerobic/Anaerobic Culture (surgical/deep wound)     Status: None (Preliminary result)   Collection Time: 11/03/19  8:16 AM   Specimen: Wound  Result Value Ref Range Status   Specimen Description WOUND RIGHT FOOT  Final   Special Requests NONE  Final   Gram Stain   Final    RARE WBC PRESENT, PREDOMINANTLY PMN FEW GRAM POSITIVE COCCI IN PAIRS Performed at [facility], [place] [address], [place], [place] [address]    Culture PENDING  Incomplete   Report Status PENDING  Incomplete  Aerobic/Anaerobic Culture (surgical/deep wound)     Status: None (Preliminary result)   Collection Time: 11/03/19  8:39 AM   Specimen: Wound  Result Value Ref Range Status   Specimen Description WOUND LEFT FOOT  Final   Special Requests NONE  Final   Gram Stain   Final    RARE WBC  PRESENT, PREDOMINANTLY PMN ABUNDANT GRAM POSITIVE COCCI IN PAIRS IN CLUSTERS FEW GRAM NEGATIVE RODS Performed at [facility], [place] [address], [place], [place] [address]    Culture PENDING  Incomplete   Report Status PENDING  Incomplete   Assessment and Plan:    Problem List Items Addressed This Visit      Endocrine   * (Principal) DKA (diabetic ketoacidosis) ([place]) - Primary   Relevant Medications   aspirin EC tablet 81 mg   atorvastatin (LIPITOR) tablet 40 mg   insulin regular, human (MYXREDLIN) 100 units/ 100 mL infusion   insulin aspart (novoLOG) injection 0-5 Units   insulin aspart (novoLOG) injection 0-15 Units   insulin glargine (LANTUS) injection 35 Units   insulin aspart (novoLOG) injection 3 Units     Other   Sepsis (HCC)   Relevant Orders   DG Chest 1 View (Completed)   DG Foot 2 Views Right (Completed)    Other Visit Diagnoses    Ulcerated, foot ([place])       Relevant Orders   DG Foot 2 Views Left (Completed)      -Patient seen and evaluated at bedside -Chart review performed -Dressing change performed applied Mepitel and dry dressing bilateral secured with Ace wraps -Continue with pillow offloading when in bed to prevent pressure ulcers -Continue with IV antibiotics may tailor pending culture results -Orders placed for physical therapy to work with patient prior to discharge and to dispense surgical shoes; advised patient that he may weight-bear for bedside, bathroom, and transfers only with assistance -Patient is stable from a podiatry standpoint for discharge on tomorrow 7/19 with antibiotics and home wound care, home nurse is to apply Adaptic and dry dressing to the graft sites twice weekly.  Patient is aware that after he is discharged from the hospital my office ([facility]) will contact him for a follow-up to be seen in 1 week.  Landis Martins, DPM [facility] [phone] office [phone] cell

## 2019-11-05 ENCOUNTER — Telehealth: Payer: Self-pay

## 2019-11-05 ENCOUNTER — Encounter (HOSPITAL_COMMUNITY): Payer: Self-pay | Admitting: Sports Medicine

## 2019-11-05 DIAGNOSIS — E1110 Type 2 diabetes mellitus with ketoacidosis without coma: Secondary | ICD-10-CM | POA: Diagnosis not present

## 2019-11-05 LAB — GLUCOSE, CAPILLARY
Glucose-Capillary: 222 mg/dL — ABNORMAL HIGH (ref 70–99)
Glucose-Capillary: 189 mg/dL — ABNORMAL HIGH (ref 70–99)

## 2019-11-05 MED ORDER — METOPROLOL TARTRATE 25 MG TABLET
ORAL_TABLET | Freq: Two times a day (BID) | ORAL | 1 refills | 30.00000 days
Start: 2019-11-05 — End: ?

## 2019-11-05 MED ORDER — CEPHALEXIN 500 MG CAPSULE: 500 mg | capsule | Freq: Three times a day (TID) | 0 refills | 7 days

## 2019-11-05 MED ORDER — CEPHALEXIN 500 MG CAPSULE
ORAL_CAPSULE | Freq: Two times a day (BID) | ORAL | 0 refills | 7.00000 days
Start: 2019-11-05 — End: ?

## 2019-11-05 MED ORDER — INSULIN GLARGINE 100 UNIT/ML ~~LOC~~ SOLN: 30.0000 [IU] | Freq: Every day | SUBCUTANEOUS | 11 refills | Status: AC

## 2019-11-05 MED ORDER — CEPHALEXIN 500 MG PO CAPS: 500.0000 mg | ORAL_CAPSULE | Freq: Two times a day (BID) | ORAL | 0 refills | Status: DC

## 2019-11-05 MED ORDER — METOPROLOL TARTRATE 25 MG PO TABS: 12.5000 mg | ORAL_TABLET | Freq: Two times a day (BID) | ORAL | 1 refills | Status: DC

## 2019-11-05 MED ORDER — LIVING WELL WITH DIABETES BOOK
Freq: Once | Status: DC
  Filled 2019-11-05: qty 1

## 2019-11-05 MED ORDER — CEPHALEXIN 500 MG PO CAPS: 500.0000 mg | ORAL_CAPSULE | Freq: Three times a day (TID) | ORAL | 0 refills | Status: AC

## 2019-11-05 MED FILL — METOPROLOL TARTRATE 50 MG TABLET: 30 days supply | Qty: 90 | Fill #3

## 2019-11-05 MED FILL — CEPHALEXIN 500 MG CAPSULE: ORAL | 7 days supply | Qty: 21 | Fill #0

## 2019-11-05 MED FILL — CEPHALEXIN 500 MG CAPSULE: 7 days supply | Qty: 21 | Fill #0 | Status: AC

## 2019-11-05 MED FILL — METOPROLOL TARTRATE 50 MG TABLET: 30 days supply | Qty: 90 | Fill #3 | Status: AC

## 2019-11-05 NOTE — TOC Transition Note (Signed)
Transition of Care [facility]) - CM/SW Discharge Note   Patient Details  Name: Anthony Blanchard MRN: [id number] Date of Birth: July 12, 1975  Transition of Care [facility]) CM/SW Contact:  Ninfa Meeker, RN Phone Number: [phone] (working Remotely) 11/05/2019, 2:15 PM   Clinical Narrative:   Case manager notified Adela Lank, [facility] Liaison of patient's plan for discharge today. RW requested from [employer] and will be delivered to patient's room. Previous CM has arranged all HH needs.     Final next level of care: [place] Barriers to Discharge: No Barriers Identified   Patient Goals and CMS Choice Patient states their goals for this hospitalization and ongoing recovery are:: to go home with sister Alyse Low in [place] and follow up with PCP CMS Medicare.gov Compare Post Acute Care list provided to:: Patient Choice offered to / list presented to : Patient  Discharge Placement                       Discharge Plan and Services In-house Referral: Clinical Social Work Discharge Planning Services: CM Consult Post Acute Care Choice: Home Health          DME Arranged: Gilford Rile rolling DME Agency: [employer] Date DME Agency Contacted: 11/05/19 Time DME Agency Contacted: 82 Representative spoke with at DME Agency: [place]: RN, Disease Management, PT, OT, Nurse's Aide [place] Agency: [facility] Date [place]: 10/31/19 Time [place]: 1539 Representative spoke with at [place]: [place] ([place]) Interventions     Readmission Risk Interventions No flowsheet data found.

## 2019-11-05 NOTE — Telephone Encounter (Signed)
FYI- Called this patient twice trying to complete TCM and schedule follow up visit. Discharged from [facility] today.The number in his chart is restricted and cannot be reached at this time. Office may need to mail patient in order to notify him to schedule an appointment.

## 2019-11-05 NOTE — Progress Notes (Signed)
Physical Therapy Treatment Patient Details Name: Anthony Blanchard MRN: [id number] DOB: 1976/03/26 Today's Date: 11/05/2019    History of Present Illness 44 y.o. 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 In ED found to have CBG of >1150, ABG ph 7.32, tachycardia and sepsis. Admitted 10/28/19 for treatement of DKA, sepsis possibly due to R foot ulcer and encephalopathy. s/p bilateral wound debridement, bone biopsy, and placement of graft performed on 11/03/2019.     PT Comments    Pt is s/p bilateral foot debridement and is now WBAT with bilateral post-op shoes. Pt will be staying at sister's apartment with 16 steps to enter. Goal of session gait and stair training. Pt is currently limited in mobility by pain in bilateral feet L>R, and decreased vision in presence of generalized weakness and decreased balance. Pt is currently supervision for bed mobility, min guard for transfers and min guard for ambulation with RW. Pt requires min A for ascent/descent of 3 steps x2. Pt reports increased pain and fatigue with steps but feels confident that he will be able to make it into his sisters home. Pt to discharge this afternoon.     Follow Up Recommendations  Supervision/Assistance - 24 hour;Home health PT     Equipment Recommendations  Rolling walker with 5" wheels       Precautions / Restrictions Precautions Precautions: Fall Precaution Comments: admitted after a fall Restrictions Weight Bearing Restrictions: Yes RLE Weight Bearing: Weight bearing as tolerated (w/ assistance using post-op shoe) LLE Weight Bearing: Weight bearing as tolerated (w/ assistance using post-op shoe)    Mobility  Bed Mobility Overal bed mobility: Needs Assistance Bed Mobility:  Supine to Sit     Supine to sit: Supervision     General bed mobility comments: supervision for safety  Transfers Overall transfer level: Needs assistance Equipment used: None;Rolling walker (2 wheeled) Transfers: Sit to/from [facility] Sit to Stand: Min guard Stand pivot transfers: Min guard       General transfer comment: min guard for safety, good power up and steadying with UE support on furniture, rail or RW   Ambulation/Gait Ambulation/Gait assistance: Min guard Gait Distance (Feet): 5 Feet Assistive device: Rolling walker (2 wheeled) Gait Pattern/deviations: Step-to pattern;Decreased stance time - left;Wide base of support Gait velocity: slowed Gait velocity interpretation: <1.31 ft/sec, indicative of household ambulator General Gait Details: min guard for safety, increased UE support to decrease weightbearing through LE    Stairs Stairs: Yes Stairs assistance: Min assist Stair Management: One rail Left;Sideways;Step to pattern Number of Stairs: 3 (3x2) General stair comments: minA for steadying using gait belt, R LE with less pain than L so, up with R and down with L, increased grimace noted          Balance Overall balance assessment: Needs assistance Sitting-balance support: Feet supported;No upper extremity supported Sitting balance-Leahy Scale: Fair     Standing balance support: Single extremity supported;During functional activity Standing balance-Leahy Scale: Poor Standing balance comment: Hand held assist and "furniture walking" during mobility                             Cognition Arousal/Alertness: Awake/alert Behavior During Therapy: WFL for tasks assessed/performed Overall Cognitive Status: No family/caregiver present to determine baseline cognitive functioning Area  of Impairment: Attention;Memory;Following commands;Safety/judgement;Awareness;Problem solving;Orientation                 Orientation Level:  Disoriented to;Situation Current Attention Level: Alternating Memory: Decreased short-term memory Following Commands: Follows multi-step commands with increased time Safety/Judgement: Decreased awareness of safety;Decreased awareness of deficits Awareness: Anticipatory Problem Solving: Requires verbal cues;Requires tactile cues General Comments: pt with improving cognition         General Comments General comments (skin integrity, edema, etc.): VSS on RA       Pertinent Vitals/Pain Pain Assessment: Faces Faces Pain Scale: Hurts little more Pain Location: Bilateral feet L>R weightbearing  Pain Descriptors / Indicators: Grimacing Pain Intervention(s): Limited activity within patient's tolerance;Monitored during session;Repositioned    Home Living Family/patient expects to be discharged to:: Private residence Living Arrangements: Other relatives (sister ) Available Help at Discharge: Family;Available 24 hours/day Type of Home: Apartment Home Access: Stairs to enter Entrance Stairs-Rails: Right;Left;Can reach both Home Layout: One level Home Equipment: None          PT Goals (current goals can now be found in the care plan section) Acute Rehab PT Goals Patient Stated Goal: To return home.  PT Goal Formulation: With patient Time For Goal Achievement: 11/12/19 Potential to Achieve Goals: Fair    Frequency    Min 3X/week      PT Plan Current plan remains appropriate    AM-PAC PT "6 Clicks" Mobility   Outcome Measure  Help needed turning from your back to your side while in a flat bed without using bedrails?: None Help needed moving from lying on your back to sitting on the side of a flat bed without using bedrails?: None Help needed moving to and from a bed to a chair (including a wheelchair)?: None Help needed standing up from a chair using your arms (e.g., wheelchair or bedside chair)?: None Help needed to walk in hospital room?: A Little Help needed climbing  3-5 steps with a railing? : A Little 6 Click Score: 22    End of Session Equipment Utilized During Treatment: Gait belt Activity Tolerance: Patient tolerated treatment well Patient left: with call bell/phone within reach;in bed Nurse Communication: Mobility status PT Visit Diagnosis: Unsteadiness on feet (R26.81);Other abnormalities of gait and mobility (R26.89);History of falling (Z91.81);Muscle weakness (generalized) (M62.81);Difficulty in walking, not elsewhere classified (R26.2);Adult, failure to thrive (R62.7);Pain Pain - Right/Left:  (bilateral feet) Pain - part of body: Ankle and joints of foot     Time: 6387-5643 PT Time Calculation (min) (ACUTE ONLY): 37 min  Charges:  $Gait Training: 23-37 mins                     Ossiel Marchio B. Migdalia Dk PT, DPT Acute Rehabilitation Services Pager [phone] Office [phone]    [place] 11/05/2019, 1:20 PM

## 2019-11-05 NOTE — Discharge Summary (Signed)
Physician Discharge Summary  Anthony Blanchard BSJ:[id number] DOB: 1975/07/05 DOA: 10/28/2019  PCP: Pleas Koch, NP  Admit date: 10/28/2019 Discharge date: 11/05/2019  Admitted From: Home Disposition:  Home  Discharge Condition:Stable CODE STATUS:FULL Diet recommendation: Heart Healthy   Brief/Interim Summary: Patient is a 44 year old male with history of diabetes type 2, ESRD status post kidney transplant 2016 at [facility], legally blind, CKD with baseline creatinine around 1.5-1.7, GERD, coronary artery disease who presented to the emergency department after a fall with abrasion to the shoulder, laceration of the lip and abrasion to the forehead.  He was complaining of generalized weakness, feeling disoriented, no loss of consciousness though.  He has poor compliance with insulin.  Patient also has ulcers on his bilateral feet.  On presentation he was found to have severe hyperglycemia with ketones in the urine.  He was admitted for the management of DKA, acute kidney injury and sepsis due to foot ulcers.  Podiatry, vascular surgery consulted.  Underwent Right and leftfoot wound debridement and bone biopsy right and left 5th metatarsal with deep wound cultures on 11/03/19.  Wound culture showed Proteus.  He is hemodynamically stable for discharge home today with oral antibiotics.  He will follow-up with podiatry an outpatient.  Home health has been arranged.  Following problems were addressed during his hospitalization:  DKA: Secondary to poor compliance with insulin and concurrent foot infection.  Insulin drip was stopped and now on Lantus and sliding scale.  Hemoglobin A1c of 12.1.  Diabetic coordinator was following.  We recommend to increase the dose of Lantus to 30 daily.  Sepsis/bilateral diabetic foot ulcers: Has chronic bilateral foot ulcers.  ABI suggested diminished circulation in the lower extremities.  Vascular surgery consulted,no plan for revascularization or amputation  .  X-rays of  the both feet indicating some concern for osteomyelitis.  MRI is suspicious of   early osteomyelitis of the lateral plantar base of the fifth metatarsal .  Underwent Right and leftfoot wound debridement and bone biopsy right and left 5th metatarsal with deep wound cultures on 11/03/19.   Wound culture showed Proteus.  Antibiotics changed to oral and will treat with total of 2 weeks course .  Streptococcus agalactiae bacteremia: Seen on one of the 2 blood culture bottles.  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  Presented with creatinine of 2.8 now closer to baseline.  Continue Myfortic and Prograf.  Hyperkalemia has resolved.  Coronary artery disease: Patient is status post PCI to RCA in 2009 and PCI to LAD in 2013  Encephalopathy: Metabolic encephalopathy in the setting of DKA, AKI, sepsis.  Currently alert and oriented.  Generalized weakness/debility/deconditioning: PT consulted.  Recommended home health on discharge.  Headache: Given migraine cocktail,resolved.     Discharge Diagnoses:  Principal Problem:   DKA (diabetic ketoacidosis) ([place]) Active Problems:   Kidney transplanted   Hyperlipidemia   Sepsis ([place])   Essential hypertension   Hyperkalemia   CKD (chronic kidney disease) stage 4, GFR 15-29 ml/min (HCC)   Diabetic foot ulcer (HCC)   DKA (diabetic ketoacidoses) ([place])    Discharge Instructions  Discharge Instructions    Diet - low sodium heart healthy   Complete by: As directed    Discharge instructions   Complete by: As directed    1)Please take prescribed medication as instructed 2)You will be called by [facility] for follow up appointment 3)Follow up  with home health services.   Discharge wound care:   Complete by: As directed    Follow up with [facility]   Increase activity slowly   Complete by:  As directed      Allergies as of 11/05/2019      Reactions   Shellfish Allergy Anaphylaxis   Shellfish-derived Products Anaphylaxis   Sulfa Antibiotics Anaphylaxis   Other reaction(s): Other (See Comments)   Iodinated Diagnostic Agents Other (See Comments)   Other reaction(s): Other (see comments)   Iodine Other (See Comments)   Other reaction(s): Other (see comments)      Medication List    TAKE these medications   acetaminophen 500 MG tablet Commonly known as: TYLENOL Take 500 mg by mouth daily.   aspirin EC 81 MG tablet Take 81 mg by mouth.   atorvastatin 40 MG tablet Commonly known as: LIPITOR Take 1 tablet (40 mg total) by mouth daily.   cephALEXin 500 MG capsule Commonly known as: KEFLEX Take 1 capsule (500 mg total) by mouth 2 (two) times daily for 7 days.   dorzolamide-timolol 22.3-6.8 MG/ML ophthalmic solution Commonly known as: COSOPT Place 1 drop into the left eye 2 (two) times daily.   Fifty50 Glucose Meter 2.0 w/Device Kit Use as instructed   insulin glargine 100 UNIT/ML injection Commonly known as: LANTUS Inject 0.3 mLs (30 Units total) into the skin at bedtime. What changed: how much to take   metoprolol tartrate 25 MG tablet Commonly known as: LOPRESSOR Take 0.5 tablets (12.5 mg total) by mouth 2 (two) times daily. What changed:   medication strength  how much to take   mycophenolate 180 MG EC tablet Commonly known as: MYFORTIC Take 540 mg by mouth 2 (two) times daily.   NovoLOG FlexPen 100 UNIT/ML FlexPen Generic drug: insulin aspart Inject 4-8 Units into the skin See admin instructions. 4 units at breakfast, 8 units at lunch and 6 units in the afternoon   omeprazole 20 MG capsule Commonly known as: PRILOSEC Take 20 mg by mouth daily.   silver sulfADIAZINE 1 % cream Commonly known as: Silvadene Apply pea-sized amount to wound daily.   tacrolimus 1 MG capsule Commonly known as: PROGRAF Take 6 mg by mouth in the morning and at  bedtime.   UltiCare Micro Pen Needles 32G X 4 MM Misc Generic drug: Insulin Pen Needle            Discharge Care Instructions  (From admission, onward)         Start     Ordered   11/05/19 0000  Discharge wound care:       Comments: Follow up with [facility]   11/05/19 1119          Follow-up Information    [facility] Follow up.   Specialty: Home Health Services Why: Registered Nurse, Physical Therapy-Occupational Therapy, Aide-[place] Office to call with visit times. If you need to call the office please call [phone].  Contact information: [place] [address] [phone]              Allergies  Allergen Reactions  . Shellfish Allergy Anaphylaxis  . Shellfish-Derived Products Anaphylaxis  . Sulfa Antibiotics Anaphylaxis    Other reaction(s): Other (See Comments)  . Iodinated Diagnostic Agents Other (See Comments)    Other reaction(s): Other (see comments)  . Iodine Other (See Comments)    Other reaction(s): Other (see comments)  Consultations:  Podiatry   Procedures/Studies: DG Chest 1 View  Result Date: 10/28/2019 CLINICAL DATA:  Sepsis EXAM: CHEST  1 VIEW COMPARISON:  None. FINDINGS: The heart size and mediastinal contours are within normal limits. Both lungs are clear. The visualized skeletal structures are unremarkable. IMPRESSION: No active disease. Electronically Signed   By: Ulyses Jarred M.D.   On: 10/28/2019 06:28   MR FOOT LEFT WO CONTRAST  Result Date: 10/30/2019 CLINICAL DATA:  Fifth metatarsal ulcer and possible osteomyelitis EXAM: MRI OF THE LEFT FOOT WITHOUT CONTRAST TECHNIQUE: Multiplanar, multisequence MR imaging of the left was performed. No intravenous contrast was administered. COMPARISON:  October 29, 2019 radiograph FINDINGS: Bones/Joint/Cartilage There is minimally increased T2 hyperintense signal seen at the lateral base of the fifth metatarsal. There is possible  subtle T1 hypointensity seen the plantar surface of the fifth metatarsal base, series 7, image 5. No osseous fracture, cortical destruction or periosteal reaction. There is an overlying soft tissue ulceration with subcutaneous emphysema. Normal osseous marrow signal seen throughout the remainder of the forefoot. The articular surfaces appear to be maintained. No large joint effusions. Ligaments The Lisfranc ligaments are intact. Muscles and Tendons Mild fatty atrophy noted within the musculature with diffusely increased feathery signal. The flexor and extensor tendons appear to be intact. Soft tissues Area of a superficial ulceration seen overlying the lateral base of the fifth metatarsal with subcutaneous emphysema. Overlying skin thickening and subcutaneous edema is noted. No loculated fluid collections are noted. IMPRESSION: Findings which could be suggestive of early osteomyelitis of the lateral plantar base of the fifth metatarsal. No osseous fracture. Overlying superficial ulceration with subcutaneous emphysema and diffuse edema. No soft tissue abscess or sinus tract. Electronically Signed   By: Prudencio Pair M.D.   On: 10/30/2019 16:50   DG Foot 2 Views Left  Result Date: 10/29/2019 CLINICAL DATA:  Pain and open sores to both feet.  Duration unknown. EXAM: LEFT FOOT - 2 VIEW COMPARISON:  None. FINDINGS: Soft tissue thinning and soft tissue air adjacent to the proximal fifth metatarsal. Decreased bone density of the subjacent proximal metatarsal suspicious for osteomyelitis with decreased density of the cortex. No other findings suspicious for osteomyelitis. No fracture. Soft tissue air adjacent to the fifth proximal metatarsal. No radiopaque foreign body. Advanced vascular calcifications. IMPRESSION: Findings suspicious for osteomyelitis of the proximal fifth metatarsal. Adjacent soft tissue air. No radiopaque foreign body. Electronically Signed   By: Keith Rake M.D.   On: 10/29/2019 19:11   DG  Foot 2 Views Right  Result Date: 10/29/2019 CLINICAL DATA:  Pain and open sores to both feet.  Duration unknown. EXAM: RIGHT FOOT - 2 VIEW COMPARISON:  Hit foot radiograph 11/15/2018 FINDINGS: Decreased bone mineral density with bony destruction involving the distal fifth metatarsal. There is adjacent soft tissue edema and dressing in place. Probable soft tissue ulcer. Minimal decreased density of fifth toe proximal phalanx. No other evidence of osteomyelitis of the foot. No radiopaque foreign body. There are vascular calcifications. IMPRESSION: 1. Findings consistent with osteomyelitis of the distal fifth metatarsal. 2. Possible/osteomyelitis of the fifth toe proximal phalanx. Suspected plantar lateral foot ulcer at the site of osteomyelitis. No radiopaque foreign body. Electronically Signed   By: Keith Rake M.D.   On: 10/29/2019 19:09   VAS [place] ABI WITH/WO TBI  Result Date: 10/30/2019 LOWER EXTREMITY DOPPLER STUDY Indications: Ulceration. High Risk Factors: Hypertension, Diabetes, coronary artery disease.  Limitations: Today's exam was limited due to no prior. Comparison Study: no prior  Performing Technologist: Abram Sander RVS  Examination Guidelines: A complete evaluation includes at minimum, Doppler waveform signals and systolic blood pressure reading at the level of bilateral brachial, anterior tibial, and posterior tibial arteries, when vessel segments are accessible. Bilateral testing is considered an integral part of a complete examination. Photoelectric Plethysmograph (PPG) waveforms and toe systolic pressure readings are included as required and additional duplex testing as needed. Limited examinations for reoccurring indications may be performed as noted.  ABI Findings: +---------+------------------+-----+----------+--------+ Right    Rt Pressure (mmHg)IndexWaveform  Comment  +---------+------------------+-----+----------+--------+ Brachial 127                    triphasic           +---------+------------------+-----+----------+--------+ PTA      82                0.59 biphasic           +---------+------------------+-----+----------+--------+ DP       98                0.70 monophasic         +---------+------------------+-----+----------+--------+ Great Toe78                0.56 Abnormal           +---------+------------------+-----+----------+--------+ +--------+------------------+-----+----------+-------+ Left    Lt Pressure (mmHg)IndexWaveform  Comment +--------+------------------+-----+----------+-------+ Brachial140                    triphasic         +--------+------------------+-----+----------+-------+ PTA     255               1.82 biphasic          +--------+------------------+-----+----------+-------+ DP      67                0.48 monophasic        +--------+------------------+-----+----------+-------+ +-------+-----------+-----------+------------+------------+ ABI/TBIToday's ABIToday's TBIPrevious ABIPrevious TBI +-------+-----------+-----------+------------+------------+ Right  0.70       0.56                                +-------+-----------+-----------+------------+------------+ Left   0.48                                           +-------+-----------+-----------+------------+------------+  Summary:  *See table(s) above for measurements and observations.  Electronically signed by Harold Barban MD on 10/30/2019 at 4:24:28 PM.   Final       Subjective: Patient seen and examined at the bedside this morning.  Hemodynamically stable for discharge today.  Discharge Exam: Vitals:   11/04/19 2149 11/05/19 0642  BP: (!) 124/54 139/79  Pulse:  84  Resp: 20 19  Temp: 97.9 F (36.6 C) 98.6 F (37 C)  SpO2:  97%   Vitals:   11/04/19 0823 11/04/19 1640 11/04/19 2149 11/05/19 0642  BP: (!) 144/69 (!) 142/61 (!) 124/54 139/79  Pulse: 92 100  84  Resp: '18 20 20 19  '$ Temp: 99.2 F (37.3 C) 98.3 F (36.8 C)  97.9 F (36.6 C) 98.6 F (37 C)  TempSrc: Oral Oral Oral Oral  SpO2:  98%  97%  Weight:      Height:        General: Pt is alert, awake, not in acute distress  Cardiovascular: RRR, S1/S2 +, no rubs, no gallops Respiratory: CTA bilaterally, no wheezing, no rhonchi Abdominal: Soft, NT, ND, bowel sounds + Extremities: no edema, no cyanosis, legs wrapped with dressings    The results of significant diagnostics from this hospitalization (including imaging, microbiology, ancillary and laboratory) are listed below for reference.     Microbiology: Recent Results (from the past 240 hour(s))  Blood culture (routine x 2)     Status: None   Collection Time: 10/28/19  4:55 AM   Specimen: BLOOD RIGHT ARM  Result Value Ref Range Status   Specimen Description BLOOD RIGHT ARM  Final   Special Requests   Final    BOTTLES DRAWN AEROBIC AND ANAEROBIC Blood Culture adequate volume   Culture   Final    NO GROWTH 5 DAYS Performed at [facility], [address] [place], [place] [address]    Report Status 11/02/2019 FINAL  Final  Blood culture (routine x 2)     Status: Abnormal   Collection Time: 10/28/19  5:02 AM   Specimen: BLOOD LEFT ARM  Result Value Ref Range Status   Specimen Description BLOOD LEFT ARM  Final   Special Requests   Final    BOTTLES DRAWN AEROBIC AND ANAEROBIC Blood Culture adequate volume   Culture  Setup Time   Final    GRAM POSITIVE COCCI IN CHAINS AEROBIC BOTTLE ONLY CRITICAL RESULT CALLED TO, READ BACK BY AND VERIFIED WITH: PHARMD LAURA SEAY AT2252 BY MESSAN H. ON 10/28/2019 Performed at [facility], [place] [address], [place], [place] [address]    Culture GROUP B STREP(S.AGALACTIAE)ISOLATED (A)  Final   Report Status 10/31/2019 FINAL  Final   Organism ID, Bacteria GROUP B STREP(S.AGALACTIAE)ISOLATED  Final      Susceptibility   Group b strep(s.agalactiae)isolated - MIC*    CLINDAMYCIN <=0.25 SENSITIVE Sensitive     AMPICILLIN <=0.25 SENSITIVE Sensitive      ERYTHROMYCIN <=0.12 SENSITIVE Sensitive     VANCOMYCIN 0.5 SENSITIVE Sensitive     CEFTRIAXONE <=0.12 SENSITIVE Sensitive     LEVOFLOXACIN 1 SENSITIVE Sensitive     PENICILLIN Value in next row Sensitive      SENSITIVE<=0.06    * GROUP B STREP(S.AGALACTIAE)ISOLATED  Blood Culture ID Panel (Reflexed)     Status: Abnormal   Collection Time: 10/28/19  5:02 AM  Result Value Ref Range Status   Enterococcus species NOT DETECTED NOT DETECTED Final   Listeria monocytogenes NOT DETECTED NOT DETECTED Final   Staphylococcus species NOT DETECTED NOT DETECTED Final   Staphylococcus aureus (BCID) NOT DETECTED NOT DETECTED Final   Streptococcus species DETECTED (A) NOT DETECTED Final    Comment: CRITICAL RESULT CALLED TO, READ BACK BY AND VERIFIED WITH: PHARMD LAURA SEAY AT 2252 BY MESSAN H. ON  10/28/2019    Streptococcus agalactiae DETECTED (A) NOT DETECTED Final    Comment: CRITICAL RESULT CALLED TO, READ BACK BY AND VERIFIED WITH: PHARMD LAURA SEAY AT 2252 BY MESSAN H. ON  10/28/2019    Streptococcus pneumoniae NOT DETECTED NOT DETECTED Final   Streptococcus pyogenes NOT DETECTED NOT DETECTED Final   Acinetobacter baumannii NOT DETECTED NOT DETECTED Final   Enterobacteriaceae species NOT DETECTED NOT DETECTED Final   Enterobacter cloacae complex NOT DETECTED NOT DETECTED Final   Escherichia coli NOT DETECTED NOT DETECTED Final   Klebsiella oxytoca NOT DETECTED NOT DETECTED Final   Klebsiella pneumoniae NOT DETECTED NOT DETECTED Final   Proteus species NOT DETECTED NOT DETECTED Final  Serratia marcescens NOT DETECTED NOT DETECTED Final   Haemophilus influenzae NOT DETECTED NOT DETECTED Final   Neisseria meningitidis NOT DETECTED NOT DETECTED Final   Pseudomonas aeruginosa NOT DETECTED NOT DETECTED Final   Candida albicans NOT DETECTED NOT DETECTED Final   Candida glabrata NOT DETECTED NOT DETECTED Final   Candida krusei NOT DETECTED NOT DETECTED Final   Candida parapsilosis NOT DETECTED  NOT DETECTED Final   Candida tropicalis NOT DETECTED NOT DETECTED Final    Comment: Performed at [facility], [place] [address], [place], [place] [address]  Urine culture     Status: Abnormal   Collection Time: 10/28/19  5:09 AM   Specimen: Urine, Random  Result Value Ref Range Status   Specimen Description URINE, RANDOM  Final   Special Requests NONE  Final   Culture (A)  Final    <10,000 COLONIES/mL INSIGNIFICANT GROWTH Performed at [facility], [place] [address] [place], [place] [address]    Report Status 10/29/2019 FINAL  Final  SARS Coronavirus 2 by RT PCR (hospital order, performed in [facility] hospital lab) Nasopharyngeal Nasopharyngeal Swab     Status: None   Collection Time: 10/28/19  7:37 AM   Specimen: Nasopharyngeal Swab  Result Value Ref Range Status   SARS Coronavirus 2 NEGATIVE NEGATIVE Final    Comment: (NOTE) SARS-CoV-2 target nucleic acids are NOT DETECTED.  The SARS-CoV-2 RNA is generally detectable in upper and lower respiratory specimens during the acute phase of infection. The lowest concentration of SARS-CoV-2 viral copies this assay can detect is 250 copies / mL. A negative result does not preclude SARS-CoV-2 infection and should not be used as the sole basis for treatment or other patient management decisions.  A negative result may occur with improper specimen collection / handling, submission of specimen other than nasopharyngeal swab, presence of viral mutation(s) within the areas targeted by this assay, and inadequate number of viral copies (<250 copies / mL). A negative result must be combined with clinical observations, patient history, and epidemiological information.  Fact Sheet for Patients:   StrictlyIdeas.no  Fact Sheet for Healthcare Providers: BankingDealers.co.za  This test is not yet approved or  cleared by the [place] FDA and has been authorized for detection and/or  diagnosis of SARS-CoV-2 by FDA under an Emergency Use Authorization (EUA).  This EUA will remain in effect (meaning this test can be used) for the duration of the COVID-19 declaration under Section 564(b)(1) of the Act, 21 U.S.C. section 360bbb-3(b)(1), unless the authorization is terminated or revoked sooner.  Performed at [facility], [place] [address], [place], [place] [address]   Culture, blood (routine x 2)     Status: None   Collection Time: 10/30/19  4:53 PM   Specimen: BLOOD RIGHT HAND  Result Value Ref Range Status   Specimen Description BLOOD RIGHT HAND  Final   Special Requests   Final    BOTTLES DRAWN AEROBIC AND ANAEROBIC Blood Culture adequate volume   Culture   Final    NO GROWTH 5 DAYS Performed at [facility], [place] [address], [place], [place] [address]    Report Status 11/04/2019 FINAL  Final  Culture, blood (routine x 2)     Status: None   Collection Time: 10/30/19  5:01 PM   Specimen: BLOOD LEFT HAND  Result Value Ref Range Status   Specimen Description BLOOD LEFT HAND  Final   Special Requests   Final    BOTTLES  DRAWN AEROBIC AND ANAEROBIC Blood Culture adequate volume   Culture   Final    NO GROWTH 5 DAYS Performed at [facility], [place] [address], [place], [place] [address]    Report Status 11/04/2019 FINAL  Final  MRSA PCR Screening     Status: None   Collection Time: 11/02/19  5:41 PM   Specimen: Nasal Mucosa; Nasopharyngeal  Result Value Ref Range Status   MRSA by PCR NEGATIVE NEGATIVE Final    Comment:        The GeneXpert MRSA Assay (FDA approved for NASAL specimens only), is one component of a comprehensive MRSA colonization surveillance program. It is not intended to diagnose MRSA infection nor to guide or monitor treatment for MRSA infections. Performed at [facility], [place] [address], [place], [place] [address]   Surgical pcr screen     Status: None   Collection Time: 11/03/19  2:10 AM   Specimen: Nasal Mucosa;  Nasal Swab  Result Value Ref Range Status   MRSA, PCR NEGATIVE NEGATIVE Final   Staphylococcus aureus NEGATIVE NEGATIVE Final    Comment: (NOTE) The Xpert SA Assay (FDA approved for NASAL specimens in patients 44 years of age and older), is one component of a comprehensive surveillance program. It is not intended to diagnose infection nor to guide or monitor treatment. Performed at [facility], [place] [address], [place], [place] [address]   Aerobic/Anaerobic Culture (surgical/deep wound)     Status: None (Preliminary result)   Collection Time: 11/03/19  8:16 AM   Specimen: Wound  Result Value Ref Range Status   Specimen Description WOUND RIGHT FOOT  Final   Special Requests NONE  Final   Gram Stain   Final    RARE WBC PRESENT, PREDOMINANTLY PMN FEW GRAM POSITIVE COCCI IN PAIRS    Culture RARE PROTEUS MIRABILIS  Final   Report Status PENDING  Incomplete   Organism ID, Bacteria PROTEUS MIRABILIS  Final      Susceptibility   Proteus mirabilis - MIC*    AMPICILLIN <=2 SENSITIVE Sensitive     CEFAZOLIN <=4 SENSITIVE Sensitive     CEFEPIME <=0.12 SENSITIVE Sensitive     CEFTAZIDIME <=1 SENSITIVE Sensitive     CEFTRIAXONE <=0.25 SENSITIVE Sensitive     CIPROFLOXACIN <=0.25 SENSITIVE Sensitive     GENTAMICIN <=1 SENSITIVE Sensitive     IMIPENEM 4 SENSITIVE Sensitive     TRIMETH/SULFA <=20 SENSITIVE Sensitive     AMPICILLIN/SULBACTAM <=2 SENSITIVE Sensitive     PIP/TAZO Value in next row Sensitive      <=4 SENSITIVEPerformed at [place] [address], [place], [place] [address]    * RARE PROTEUS MIRABILIS  Aerobic/Anaerobic Culture (surgical/deep wound)     Status: None (Preliminary result)   Collection Time: 11/03/19  8:39 AM   Specimen: Wound  Result Value Ref Range Status   Specimen Description WOUND LEFT FOOT  Final   Special Requests NONE  Final   Gram Stain   Final    RARE WBC PRESENT, PREDOMINANTLY PMN ABUNDANT GRAM POSITIVE COCCI IN PAIRS IN  CLUSTERS FEW GRAM NEGATIVE RODS    Culture   Final    MODERATE GROUP B STREP(S.AGALACTIAE)ISOLATED TESTING AGAINST S. AGALACTIAE NOT ROUTINELY PERFORMED DUE TO PREDICTABILITY OF AMP/PEN/VAN SUSCEPTIBILITY. CULTURE REINCUBATED FOR BETTER GROWTH Performed at [facility], [place] [address], [place],  [address]    Report Status PENDING  Incomplete     Labs: BNP (  last 3 results) No results for input(s): BNP in the last 8760 hours. Basic Metabolic Panel: Recent Labs  Lab 10/30/19 0429 10/31/19 0525 11/02/19 0335 11/04/19 0553  NA 135 132* 130* 132*  K 4.0 3.8 4.0 4.1  CL 104 100 100 101  CO2 23 21* 21* 24  GLUCOSE 163* 174* 133* 148*  BUN 21* 22* 22* 17  CREATININE 1.32* 1.37* 1.42* 1.36*  CALCIUM 9.4 9.0 9.0 9.2   Liver Function Tests: No results for input(s): AST, ALT, ALKPHOS, BILITOT, PROT, ALBUMIN in the last 168 hours. No results for input(s): LIPASE, AMYLASE in the last 168 hours. No results for input(s): AMMONIA in the last 168 hours. CBC: Recent Labs  Lab 10/30/19 0429 10/31/19 0525 11/02/19 0335 11/04/19 0553  WBC 14.6* 12.7* 13.9* 10.9*  NEUTROABS  --   --  10.8* 8.3*  HGB 10.1* 9.7* 9.6* 9.1*  HCT 32.9* 32.5* 32.6* 30.5*  MCV 84.6 85.3 83.6 83.6  PLT 236 190 183 207   Cardiac Enzymes: No results for input(s): CKTOTAL, CKMB, CKMBINDEX, TROPONINI in the last 168 hours. BNP: Invalid input(s): POCBNP CBG: Recent Labs  Lab 11/04/19 0740 11/04/19 1145 11/04/19 1658 11/04/19 2145 11/05/19 0807  GLUCAP 133* 72 160* 166* 222*   D-Dimer No results for input(s): DDIMER in the last 72 hours. Hgb A1c No results for input(s): HGBA1C in the last 72 hours. Lipid Profile No results for input(s): CHOL, HDL, LDLCALC, TRIG, CHOLHDL, LDLDIRECT in the last 72 hours. Thyroid function studies No results for input(s): TSH, T4TOTAL, T3FREE, THYROIDAB in the last 72 hours.  Invalid input(s): FREET3 Anemia work up No results for input(s): VITAMINB12,  FOLATE, FERRITIN, TIBC, IRON, RETICCTPCT in the last 72 hours. Urinalysis    Component Value Date/Time   COLORURINE STRAW (A) 10/28/2019 0509   APPEARANCEUR CLEAR 10/28/2019 0509   LABSPEC 1.026 10/28/2019 0509   PHURINE 5.0 10/28/2019 0509   GLUCOSEU >=500 (A) 10/28/2019 0509   HGBUR MODERATE (A) 10/28/2019 0509   BILIRUBINUR NEGATIVE 10/28/2019 0509   KETONESUR 20 (A) 10/28/2019 0509   PROTEINUR NEGATIVE 10/28/2019 0509   NITRITE NEGATIVE 10/28/2019 0509   LEUKOCYTESUR NEGATIVE 10/28/2019 0509   Sepsis Labs Invalid input(s): PROCALCITONIN,  WBC,  LACTICIDVEN Microbiology Recent Results (from the past 240 hour(s))  Blood culture (routine x 2)     Status: None   Collection Time: 10/28/19  4:55 AM   Specimen: BLOOD RIGHT ARM  Result Value Ref Range Status   Specimen Description BLOOD RIGHT ARM  Final   Special Requests   Final    BOTTLES DRAWN AEROBIC AND ANAEROBIC Blood Culture adequate volume   Culture   Final    NO GROWTH 5 DAYS Performed at [facility], [place] [address] [place], [place] [address]    Report Status 11/02/2019 FINAL  Final  Blood culture (routine x 2)     Status: Abnormal   Collection Time: 10/28/19  5:02 AM   Specimen: BLOOD LEFT ARM  Result Value Ref Range Status   Specimen Description BLOOD LEFT ARM  Final   Special Requests   Final    BOTTLES DRAWN AEROBIC AND ANAEROBIC Blood Culture adequate volume   Culture  Setup Time   Final    GRAM POSITIVE COCCI IN CHAINS AEROBIC BOTTLE ONLY CRITICAL RESULT CALLED TO, READ BACK BY AND VERIFIED WITH: PHARMD LAURA SEAY AT2252 BY MESSAN H. ON 10/28/2019 Performed at [facility], [place] [address] [place], [place] [address]  Culture GROUP B STREP(S.AGALACTIAE)ISOLATED (A)  Final   Report Status 10/31/2019 FINAL  Final   Organism ID, Bacteria GROUP B STREP(S.AGALACTIAE)ISOLATED  Final      Susceptibility   Group b strep(s.agalactiae)isolated - MIC*    CLINDAMYCIN <=0.25 SENSITIVE Sensitive      AMPICILLIN <=0.25 SENSITIVE Sensitive     ERYTHROMYCIN <=0.12 SENSITIVE Sensitive     VANCOMYCIN 0.5 SENSITIVE Sensitive     CEFTRIAXONE <=0.12 SENSITIVE Sensitive     LEVOFLOXACIN 1 SENSITIVE Sensitive     PENICILLIN Value in next row Sensitive      SENSITIVE<=0.06    * GROUP B STREP(S.AGALACTIAE)ISOLATED  Blood Culture ID Panel (Reflexed)     Status: Abnormal   Collection Time: 10/28/19  5:02 AM  Result Value Ref Range Status   Enterococcus species NOT DETECTED NOT DETECTED Final   Listeria monocytogenes NOT DETECTED NOT DETECTED Final   Staphylococcus species NOT DETECTED NOT DETECTED Final   Staphylococcus aureus (BCID) NOT DETECTED NOT DETECTED Final   Streptococcus species DETECTED (A) NOT DETECTED Final    Comment: CRITICAL RESULT CALLED TO, READ BACK BY AND VERIFIED WITH: PHARMD LAURA SEAY AT 2252 BY MESSAN H. ON  10/28/2019    Streptococcus agalactiae DETECTED (A) NOT DETECTED Final    Comment: CRITICAL RESULT CALLED TO, READ BACK BY AND VERIFIED WITH: PHARMD LAURA SEAY AT 2252 BY MESSAN H. ON  10/28/2019    Streptococcus pneumoniae NOT DETECTED NOT DETECTED Final   Streptococcus pyogenes NOT DETECTED NOT DETECTED Final   Acinetobacter baumannii NOT DETECTED NOT DETECTED Final   Enterobacteriaceae species NOT DETECTED NOT DETECTED Final   Enterobacter cloacae complex NOT DETECTED NOT DETECTED Final   Escherichia coli NOT DETECTED NOT DETECTED Final   Klebsiella oxytoca NOT DETECTED NOT DETECTED Final   Klebsiella pneumoniae NOT DETECTED NOT DETECTED Final   Proteus species NOT DETECTED NOT DETECTED Final   Serratia marcescens NOT DETECTED NOT DETECTED Final   Haemophilus influenzae NOT DETECTED NOT DETECTED Final   Neisseria meningitidis NOT DETECTED NOT DETECTED Final   Pseudomonas aeruginosa NOT DETECTED NOT DETECTED Final   Candida albicans NOT DETECTED NOT DETECTED Final   Candida glabrata NOT DETECTED NOT DETECTED Final   Candida krusei NOT DETECTED NOT DETECTED  Final   Candida parapsilosis NOT DETECTED NOT DETECTED Final   Candida tropicalis NOT DETECTED NOT DETECTED Final    Comment: Performed at [facility], [place]. [address] [place], [place] [address]  Urine culture     Status: Abnormal   Collection Time: 10/28/19  5:09 AM   Specimen: Urine, Random  Result Value Ref Range Status   Specimen Description URINE, RANDOM  Final   Special Requests NONE  Final   Culture (A)  Final    <10,000 COLONIES/mL INSIGNIFICANT GROWTH Performed at [facility], [place] [address], [place], [place] [address]    Report Status 10/29/2019 FINAL  Final  SARS Coronavirus 2 by RT PCR (hospital order, performed in [facility] hospital lab) Nasopharyngeal Nasopharyngeal Swab     Status: None   Collection Time: 10/28/19  7:37 AM   Specimen: Nasopharyngeal Swab  Result Value Ref Range Status   SARS Coronavirus 2 NEGATIVE NEGATIVE Final    Comment: (NOTE) SARS-CoV-2 target nucleic acids are NOT DETECTED.  The SARS-CoV-2 RNA is generally detectable in upper and lower respiratory specimens during the acute phase of infection. The lowest concentration of SARS-CoV-2 viral copies this assay can detect is 250 copies / mL. A  negative result does not preclude SARS-CoV-2 infection and should not be used as the sole basis for treatment or other patient management decisions.  A negative result may occur with improper specimen collection / handling, submission of specimen other than nasopharyngeal swab, presence of viral mutation(s) within the areas targeted by this assay, and inadequate number of viral copies (<250 copies / mL). A negative result must be combined with clinical observations, patient history, and epidemiological information.  Fact Sheet for Patients:   StrictlyIdeas.no  Fact Sheet for Healthcare Providers: BankingDealers.co.za  This test is not yet approved or  cleared by the [place] FDA and has  been authorized for detection and/or diagnosis of SARS-CoV-2 by FDA under an Emergency Use Authorization (EUA).  This EUA will remain in effect (meaning this test can be used) for the duration of the COVID-19 declaration under Section 564(b)(1) of the Act, 21 U.S.C. section 360bbb-3(b)(1), unless the authorization is terminated or revoked sooner.  Performed at [facility], [place] [address] [place], [place] [address]   Culture, blood (routine x 2)     Status: None   Collection Time: 10/30/19  4:53 PM   Specimen: BLOOD RIGHT HAND  Result Value Ref Range Status   Specimen Description BLOOD RIGHT HAND  Final   Special Requests   Final    BOTTLES DRAWN AEROBIC AND ANAEROBIC Blood Culture adequate volume   Culture   Final    NO GROWTH 5 DAYS Performed at [facility], [place] [address], [place], [place] [address]    Report Status 11/04/2019 FINAL  Final  Culture, blood (routine x 2)     Status: None   Collection Time: 10/30/19  5:01 PM   Specimen: BLOOD LEFT HAND  Result Value Ref Range Status   Specimen Description BLOOD LEFT HAND  Final   Special Requests   Final    BOTTLES DRAWN AEROBIC AND ANAEROBIC Blood Culture adequate volume   Culture   Final    NO GROWTH 5 DAYS Performed at [facility], [place] [address] [place], [place] [address]    Report Status 11/04/2019 FINAL  Final  MRSA PCR Screening     Status: None   Collection Time: 11/02/19  5:41 PM   Specimen: Nasal Mucosa; Nasopharyngeal  Result Value Ref Range Status   MRSA by PCR NEGATIVE NEGATIVE Final    Comment:        The GeneXpert MRSA Assay (FDA approved for NASAL specimens only), is one component of a comprehensive MRSA colonization surveillance program. It is not intended to diagnose MRSA infection nor to guide or monitor treatment for MRSA infections. Performed at [facility], [place] [address], [place], [place] [address]   Surgical pcr screen     Status: None   Collection Time: 11/03/19   2:10 AM   Specimen: Nasal Mucosa; Nasal Swab  Result Value Ref Range Status   MRSA, PCR NEGATIVE NEGATIVE Final   Staphylococcus aureus NEGATIVE NEGATIVE Final    Comment: (NOTE) The Xpert SA Assay (FDA approved for NASAL specimens in patients 28 years of age and older), is one component of a comprehensive surveillance program. It is not intended to diagnose infection nor to guide or monitor treatment. Performed at [facility], [place] [address], [place], [place] [address]   Aerobic/Anaerobic Culture (surgical/deep wound)     Status: None (Preliminary result)   Collection Time: 11/03/19  8:16 AM   Specimen: Wound  Result Value  Ref Range Status   Specimen Description WOUND RIGHT FOOT  Final   Special Requests NONE  Final   Gram Stain   Final    RARE WBC PRESENT, PREDOMINANTLY PMN FEW GRAM POSITIVE COCCI IN PAIRS    Culture RARE PROTEUS MIRABILIS  Final   Report Status PENDING  Incomplete   Organism ID, Bacteria PROTEUS MIRABILIS  Final      Susceptibility   Proteus mirabilis - MIC*    AMPICILLIN <=2 SENSITIVE Sensitive     CEFAZOLIN <=4 SENSITIVE Sensitive     CEFEPIME <=0.12 SENSITIVE Sensitive     CEFTAZIDIME <=1 SENSITIVE Sensitive     CEFTRIAXONE <=0.25 SENSITIVE Sensitive     CIPROFLOXACIN <=0.25 SENSITIVE Sensitive     GENTAMICIN <=1 SENSITIVE Sensitive     IMIPENEM 4 SENSITIVE Sensitive     TRIMETH/SULFA <=20 SENSITIVE Sensitive     AMPICILLIN/SULBACTAM <=2 SENSITIVE Sensitive     PIP/TAZO Value in next row Sensitive      <=4 SENSITIVEPerformed at [place] [address], [place], [place] [address]    * RARE PROTEUS MIRABILIS  Aerobic/Anaerobic Culture (surgical/deep wound)     Status: None (Preliminary result)   Collection Time: 11/03/19  8:39 AM   Specimen: Wound  Result Value Ref Range Status   Specimen Description WOUND LEFT FOOT  Final   Special Requests NONE  Final   Gram Stain   Final    RARE WBC PRESENT, PREDOMINANTLY PMN ABUNDANT GRAM  POSITIVE COCCI IN PAIRS IN CLUSTERS FEW GRAM NEGATIVE RODS    Culture   Final    MODERATE GROUP B STREP(S.AGALACTIAE)ISOLATED TESTING AGAINST S. AGALACTIAE NOT ROUTINELY PERFORMED DUE TO PREDICTABILITY OF AMP/PEN/VAN SUSCEPTIBILITY. CULTURE REINCUBATED FOR BETTER GROWTH Performed at [facility], [place] [address], [place], [place] [address]    Report Status PENDING  Incomplete    Please note: You were cared for by a hospitalist during your hospital stay. Once you are discharged, your primary care physician will handle any further medical issues. Please note that NO REFILLS for any discharge medications will be authorized once you are discharged, as it is imperative that you return to your primary care physician (or establish a relationship with a primary care physician if you do not have one) for your post hospital discharge needs so that they can reassess your need for medications and monitor your lab values.    Time coordinating discharge: 40 minutes  SIGNED:   Shelly Coss, MD  [facility] 11/05/2019, 11:26 AM Pager [phone]  If 7PM-7AM, please contact night-coverage [email or web address] Password [email or web address]

## 2019-11-05 NOTE — Telephone Encounter (Signed)
Anthony Blanchard, I wasn't sure if this goes to you or [place]. Thanks.

## 2019-11-05 NOTE — Care Management Important Message (Signed)
Important Message  Patient Details  Name: Anthony Blanchard MRN: [id number] Date of Birth: [birth year]   Medicare Important Message Given:  Yes     Shelda Altes 11/05/2019, 11:59 AM

## 2019-11-06 LAB — SURGICAL PATHOLOGY

## 2019-11-06 NOTE — Telephone Encounter (Signed)
This is supposed to go to [place] so an appointment can be schedule.

## 2019-11-08 LAB — AEROBIC/ANAEROBIC CULTURE W GRAM STAIN (SURGICAL/DEEP WOUND)

## 2019-11-09 DIAGNOSIS — Z940 Kidney transplant status: Principal | ICD-10-CM

## 2019-11-09 LAB — AEROBIC/ANAEROBIC CULTURE W GRAM STAIN (SURGICAL/DEEP WOUND)

## 2019-11-09 NOTE — Unmapped (Signed)
[facility] Refill Coordination Note    Specialty Medication(s) to be Shipped:   Transplant: mycophenolate mofetil 180mg  and tacrolimus 1mg     Other medication(s) to be shipped: pen needles, omeprazole 20mg  and lantus     Benjamin Maldonado, DOB: [birth year]  Phone: [phone] (home)       All above HIPAA information was verified with patient.     Was a Nurse, learning disability used for this call? No    Completed refill call assessment today to schedule patient's medication shipment from the [facility] [phone]).       Specialty medication(s) and dose(s) confirmed: Regimen is correct and unchanged.   Changes to medications: Benjamin Maldonado reports no changes at this time.  Changes to insurance: No  Questions for the pharmacist: No    Confirmed patient received Welcome Packet with first shipment. The patient will receive a drug information handout for each medication shipped and additional FDA Medication Guides as required.       DISEASE/MEDICATION-SPECIFIC INFORMATION        N/A    SPECIALTY MEDICATION ADHERENCE     Medication Adherence    Patient reported X missed doses in the last month: 0  Specialty Medication: Mycophenolate 180mg   Patient is on additional specialty medications: Yes  Additional Specialty Medications: Tacrolimus 1mg   Patient Reported Additional Medication X Missed Doses in the Last Month: 0  Patient is on more than two specialty medications: No  Adherence tools used: patient uses a pill box to manage medications  Support network for adherence: family member        Mycophenoalte 180 mg: 10 days of medicine on hand   Tacrolimus 1 mg: 10 days of medicine on hand     SHIPPING     Shipping address confirmed in Epic.     Delivery Scheduled: Yes, Expected medication delivery date: 11/15/2019.     Medication will be delivered via UPS to the prescription address in Epic WAM.    Lorelei Pont [facility] Specialty Technician

## 2019-11-09 NOTE — Unmapped (Signed)
Spoke to patient.  Reports he is out of the hospital and feeling so much better. Wounds healing to feet and BS under control. Currently staying with sister in [place]. Denies any needs.    Due for follow up apt with Dr Margaretmary Bayley. Will message to get annual apt set up    Patient denies any needs, states he has all of his medications, and is eating and drinking well.  Will let me know if he has not heard about apt.

## 2019-11-10 DIAGNOSIS — A401 Sepsis due to streptococcus, group B: Secondary | ICD-10-CM | POA: Diagnosis not present

## 2019-11-10 DIAGNOSIS — T86821 Skin graft (allograft) (autograft) failure: Secondary | ICD-10-CM | POA: Diagnosis not present

## 2019-11-10 DIAGNOSIS — G9341 Metabolic encephalopathy: Secondary | ICD-10-CM | POA: Diagnosis not present

## 2019-11-10 DIAGNOSIS — L97329 Non-pressure chronic ulcer of left ankle with unspecified severity: Secondary | ICD-10-CM | POA: Diagnosis not present

## 2019-11-10 DIAGNOSIS — E1310 Other specified diabetes mellitus with ketoacidosis without coma: Secondary | ICD-10-CM | POA: Diagnosis not present

## 2019-11-10 DIAGNOSIS — E11621 Type 2 diabetes mellitus with foot ulcer: Secondary | ICD-10-CM | POA: Diagnosis not present

## 2019-11-10 DIAGNOSIS — N179 Acute kidney failure, unspecified: Secondary | ICD-10-CM | POA: Diagnosis not present

## 2019-11-10 DIAGNOSIS — L97429 Non-pressure chronic ulcer of left heel and midfoot with unspecified severity: Secondary | ICD-10-CM | POA: Diagnosis not present

## 2019-11-10 DIAGNOSIS — L97419 Non-pressure chronic ulcer of right heel and midfoot with unspecified severity: Secondary | ICD-10-CM | POA: Diagnosis not present

## 2019-11-10 DIAGNOSIS — B964 Proteus (mirabilis) (morganii) as the cause of diseases classified elsewhere: Secondary | ICD-10-CM | POA: Diagnosis not present

## 2019-11-12 DIAGNOSIS — Z940 Kidney transplant status: Principal | ICD-10-CM

## 2019-11-12 DIAGNOSIS — E11621 Type 2 diabetes mellitus with foot ulcer: Secondary | ICD-10-CM | POA: Diagnosis not present

## 2019-11-12 DIAGNOSIS — N179 Acute kidney failure, unspecified: Secondary | ICD-10-CM | POA: Diagnosis not present

## 2019-11-12 DIAGNOSIS — B964 Proteus (mirabilis) (morganii) as the cause of diseases classified elsewhere: Secondary | ICD-10-CM | POA: Diagnosis not present

## 2019-11-12 DIAGNOSIS — L97419 Non-pressure chronic ulcer of right heel and midfoot with unspecified severity: Secondary | ICD-10-CM | POA: Diagnosis not present

## 2019-11-12 DIAGNOSIS — L97429 Non-pressure chronic ulcer of left heel and midfoot with unspecified severity: Secondary | ICD-10-CM | POA: Diagnosis not present

## 2019-11-12 DIAGNOSIS — L97329 Non-pressure chronic ulcer of left ankle with unspecified severity: Secondary | ICD-10-CM | POA: Diagnosis not present

## 2019-11-12 DIAGNOSIS — A401 Sepsis due to streptococcus, group B: Secondary | ICD-10-CM | POA: Diagnosis not present

## 2019-11-12 DIAGNOSIS — T86821 Skin graft (allograft) (autograft) failure: Secondary | ICD-10-CM | POA: Diagnosis not present

## 2019-11-12 DIAGNOSIS — G9341 Metabolic encephalopathy: Secondary | ICD-10-CM | POA: Diagnosis not present

## 2019-11-13 ENCOUNTER — Emergency Department (HOSPITAL_COMMUNITY)
Admission: EM | Admit: 2019-11-13 | Discharge: 2019-11-13 | Disposition: A | Discharge status: Home / Self Care | Payer: Medicare HMO | Attending: Emergency Medicine | Admitting: Emergency Medicine

## 2019-11-13 ENCOUNTER — Ambulatory Visit: Payer: Medicare HMO | Admitting: Sports Medicine

## 2019-11-13 ENCOUNTER — Telehealth: Payer: Self-pay | Admitting: *Deleted

## 2019-11-13 DIAGNOSIS — M79672 Pain in left foot: Secondary | ICD-10-CM | POA: Insufficient documentation

## 2019-11-13 DIAGNOSIS — R531 Weakness: Secondary | ICD-10-CM | POA: Diagnosis not present

## 2019-11-13 DIAGNOSIS — M79671 Pain in right foot: Secondary | ICD-10-CM | POA: Insufficient documentation

## 2019-11-13 DIAGNOSIS — Z5321 Procedure and treatment not carried out due to patient leaving prior to being seen by health care provider: Secondary | ICD-10-CM | POA: Insufficient documentation

## 2019-11-13 DIAGNOSIS — R109 Unspecified abdominal pain: Secondary | ICD-10-CM | POA: Insufficient documentation

## 2019-11-13 LAB — CBC WITH DIFFERENTIAL/PLATELET
Abs Immature Granulocytes: 0.06 10*3/uL (ref 0.00–0.07)
Basophils Absolute: 0 10*3/uL (ref 0.0–0.1)
Basophils Relative: 0 %
Eosinophils Absolute: 0 10*3/uL (ref 0.0–0.5)
Eosinophils Relative: 0 %
HCT: 33.8 % — ABNORMAL LOW (ref 39.0–52.0)
Hemoglobin: 9.5 g/dL — ABNORMAL LOW (ref 13.0–17.0)
Immature Granulocytes: 0 %
Lymphocytes Relative: 5 %
Lymphs Abs: 0.7 10*3/uL (ref 0.7–4.0)
MCH: 24.7 pg — ABNORMAL LOW (ref 26.0–34.0)
MCHC: 28.1 g/dL — ABNORMAL LOW (ref 30.0–36.0)
MCV: 88 fL (ref 80.0–100.0)
Monocytes Absolute: 0.7 10*3/uL (ref 0.1–1.0)
Monocytes Relative: 5 %
Neutro Abs: 12.8 10*3/uL — ABNORMAL HIGH (ref 1.7–7.7)
Neutrophils Relative %: 90 %
Platelets: 290 10*3/uL (ref 150–400)
RBC: 3.84 MIL/uL — ABNORMAL LOW (ref 4.22–5.81)
RDW: 16.1 % — ABNORMAL HIGH (ref 11.5–15.5)
WBC: 14.2 10*3/uL — ABNORMAL HIGH (ref 4.0–10.5)
nRBC: 0 % (ref 0.0–0.2)

## 2019-11-13 LAB — COMPREHENSIVE METABOLIC PANEL
ALT: 16 U/L (ref 0–44)
AST: 15 U/L (ref 15–41)
Albumin: 2.8 g/dL — ABNORMAL LOW (ref 3.5–5.0)
Alkaline Phosphatase: 52 U/L (ref 38–126)
Anion gap: 13 (ref 5–15)
BUN: 17 mg/dL (ref 6–20)
CO2: 24 mmol/L (ref 22–32)
Calcium: 9.6 mg/dL (ref 8.9–10.3)
Chloride: 99 mmol/L (ref 98–111)
Creatinine, Ser: 1.35 mg/dL — ABNORMAL HIGH (ref 0.61–1.24)
GFR calc Af Amer: >60 mL/min (ref >60)
GFR calc non Af Amer: >60 mL/min (ref >60)
Glucose, Bld: 225 mg/dL — ABNORMAL HIGH (ref 70–99)
Potassium: 4.7 mmol/L (ref 3.5–5.1)
Sodium: 136 mmol/L (ref 135–145)
Total Bilirubin: 0.8 mg/dL (ref 0.3–1.2)
Total Protein: 7.7 g/dL (ref 6.5–8.1)

## 2019-11-13 LAB — LACTIC ACID, PLASMA: Lactic Acid, Venous: 1.5 mmol/L (ref 0.5–1.9)

## 2019-11-13 MED ORDER — SODIUM CHLORIDE 0.9% FLUSH: 3.0000 mL | Freq: Once | INTRAVENOUS | Status: DC

## 2019-11-13 NOTE — Telephone Encounter (Signed)
I called [facility] and was told [name] was at lunch to call again in 15 minutes.

## 2019-11-13 NOTE — ED Notes (Signed)
Per Dr. Cannon Kettle: Patient has a graft on his foot. Patient's graph needs to be maintained. If patient has to be admitted, then podiatry should be called for consult. If patient is d/c then office should be called for a new apt with any available physician.

## 2019-11-13 NOTE — ED Triage Notes (Signed)
Pt reports bilateral foot pain following surgery with Dr. Cannon Kettle. Patient reports he was not given followup instructions and the wounds are leaking. Patient also reports left sided pain

## 2019-11-13 NOTE — Telephone Encounter (Signed)
Pt's sister, Ozella Rocks states they are coming in from out of town and will be late, they were told pt may have to reschedule.

## 2019-11-13 NOTE — Telephone Encounter (Signed)
I spoke with Dr. Cannon Kettle and she states pt grafts should be left in place cover with Adaptic non-stick type dressing and cover with dry sterile dressing, if pt is admitted have referral sent to podiatry and Dr. Sherryle Lis will see in hospital.

## 2019-11-13 NOTE — Telephone Encounter (Signed)
[facility] called stating pt is at the ED and they would like to know what to do, or call pt's sister.

## 2019-11-13 NOTE — Telephone Encounter (Signed)
I spoke with Anthony Blanchard and she states pt WBC is slightly elevated but nothing abnormal for what he had done. I informed [name] of Dr. Leeanne Rio orders and if pt was not admitted to have schedule with our office. [name] states understanding.

## 2019-11-14 ENCOUNTER — Ambulatory Visit (INDEPENDENT_AMBULATORY_CARE_PROVIDER_SITE_OTHER): Payer: Medicare HMO | Admitting: Podiatry

## 2019-11-14 ENCOUNTER — Encounter: Payer: Self-pay | Admitting: Podiatry

## 2019-11-14 ENCOUNTER — Other Ambulatory Visit: Payer: Self-pay

## 2019-11-14 DIAGNOSIS — E11621 Type 2 diabetes mellitus with foot ulcer: Secondary | ICD-10-CM

## 2019-11-14 DIAGNOSIS — Z9889 Other specified postprocedural states: Secondary | ICD-10-CM

## 2019-11-14 DIAGNOSIS — L97424 Non-pressure chronic ulcer of left heel and midfoot with necrosis of bone: Secondary | ICD-10-CM | POA: Diagnosis not present

## 2019-11-14 DIAGNOSIS — E1142 Type 2 diabetes mellitus with diabetic polyneuropathy: Secondary | ICD-10-CM | POA: Diagnosis not present

## 2019-11-14 MED ORDER — SANTYL 250 UNIT/GM EX OINT
1.0000 | TOPICAL_OINTMENT | Freq: Every day | CUTANEOUS | 0 refills | Status: DC
Start: 2019-11-14 — End: 2023-02-15

## 2019-11-14 MED ORDER — CEPHALEXIN 500 MG PO CAPS: 500.0000 mg | ORAL_CAPSULE | Freq: Three times a day (TID) | ORAL | 0 refills | Status: AC

## 2019-11-14 MED FILL — ULTICARE PEN NEEDLE 32 GAUGE X 5/32" (4 MM): 25 days supply | Qty: 100 | Fill #7

## 2019-11-14 MED FILL — ULTICARE PEN NEEDLE 32 GAUGE X 5/32" (4 MM): 25 days supply | Qty: 100 | Fill #7 | Status: AC

## 2019-11-14 MED FILL — OMEPRAZOLE 20 MG CAPSULE,DELAYED RELEASE: ORAL | 30 days supply | Qty: 30 | Fill #1

## 2019-11-14 MED FILL — LANTUS SOLOSTAR U-100 INSULIN 100 UNIT/ML (3 ML) SUBCUTANEOUS PEN: 34 days supply | Qty: 15 | Fill #1 | Status: AC

## 2019-11-14 MED FILL — OMEPRAZOLE 20 MG CAPSULE,DELAYED RELEASE: 30 days supply | Qty: 30 | Fill #1 | Status: AC

## 2019-11-14 MED FILL — LANTUS SOLOSTAR U-100 INSULIN 100 UNIT/ML (3 ML) SUBCUTANEOUS PEN: SUBCUTANEOUS | 34 days supply | Qty: 15 | Fill #1

## 2019-11-14 MED FILL — TACROLIMUS 1 MG CAPSULE, IMMEDIATE-RELEASE: ORAL | 30 days supply | Qty: 360 | Fill #11

## 2019-11-14 MED FILL — MYCOPHENOLATE SODIUM 180 MG TABLET,DELAYED RELEASE: ORAL | 30 days supply | Qty: 180 | Fill #6

## 2019-11-14 MED FILL — TACROLIMUS 1 MG CAPSULE, IMMEDIATE-RELEASE: 30 days supply | Qty: 360 | Fill #11 | Status: AC

## 2019-11-14 MED FILL — MYCOPHENOLATE SODIUM 180 MG TABLET,DELAYED RELEASE: 30 days supply | Qty: 180 | Fill #6 | Status: AC

## 2019-11-14 NOTE — Progress Notes (Signed)
  Subjective:  Patient ID: Anthony Blanchard, male    DOB: 11-08-1975,  MRN: [id number]  Chief Complaint  Patient presents with  . Diabetic Ulcer    bilateral 2 week follow up    44 y.o. male presents with the above complaint. History confirmed with patient. Presents today with his sister, who is been his primary caretaker. He is 11 days postop from inpatient surgery at [facility]. Went to the ER yesterday because he missed his appointment and thought maybe he should have it changed. Underwent bilateral wound debridements, xenograft applications. Almost finished with his course of Keflex. Dressings have not been changed.  Objective:  Physical Exam: Both feet are warm with capillary refill intact, thin shiny dry skin is present, on the right foot the xenograft's are intact with staples applied, healing well, no signs of infection on the left foot the lateral malleolar xenograft is healing well with staples intact, no signs of infection; the lateral foot xenograft has drainage strep knee foot and fibrotic base nearly 100% of the graft. Malodor here as well. No cellulitis.      7/17 culture from the OR with MSSA  Leukocytosis yesterday in ED Assessment:   1. Diabetic peripheral neuropathy associated with type 2 diabetes mellitus ([place])   2. Diabetic ulcer of left midfoot associated with type 2 diabetes mellitus, with necrosis of bone ([place])   3. Post-operative state      Plan:  Patient was evaluated and treated and all questions answered.  -Xenograft on the left lateral ankle and right foot were left intact, covered with sterile dry dressings -on the left lateral foot the xenograft had dropped with significant bone drainage and fibrotic material. Staples were removed and the silicone layer was removed. 0% graft take. Wound was debrided of devitalized and fibrous tissue. See procedure note below. Wet-to-dry saline dressings were applied. -He currently has home care from [facility], will  send referral for home care changes 3 times weekly with Santyl and wet-to-dry gauze on the lateral wound, and dry dressings on the remaining wounds. -Refill of Keflex sent today. He should continue this as he appears to have a continued mild soft tissue infection. -Strict return precautions were reviewed -Return in 1 week for reevaluation with Dr. Cannon Kettle    Procedure: Excisional Debridement of Wound Indication: Removal of non-viable soft tissue from the wound to promote healing.  Anesthesia: none Pre-Debridement Wound Measurements: 9.0 cm x 3.5 cm x 0.5 cm  Post-Debridement Wound Measurements: 9.0 cm x 3.5 cm x 0.5 cm  Type of Debridement: Sharp Excisional Tissue Removed: Non-viable soft tissue Instrumentation: 15 blade and tissue nipper Depth of Debridement: subcutaneous tissue, fibrotic fascia Technique: Sharp excisional debridement to bleeding, viable wound base.  Dressing: Dry, sterile, compression dressing. Disposition: Patient tolerated procedure well. Patient to return in 1 week for follow-up.      Return in about 6 days (around 11/20/2019) for diabetic ulcer bilateral w/ Dr Cannon Kettle; post op from hospital.

## 2019-11-15 ENCOUNTER — Telehealth: Payer: Self-pay | Admitting: *Deleted

## 2019-11-15 DIAGNOSIS — N179 Acute kidney failure, unspecified: Secondary | ICD-10-CM | POA: Diagnosis not present

## 2019-11-15 DIAGNOSIS — L97429 Non-pressure chronic ulcer of left heel and midfoot with unspecified severity: Secondary | ICD-10-CM | POA: Diagnosis not present

## 2019-11-15 DIAGNOSIS — E11621 Type 2 diabetes mellitus with foot ulcer: Secondary | ICD-10-CM | POA: Diagnosis not present

## 2019-11-15 DIAGNOSIS — G9341 Metabolic encephalopathy: Secondary | ICD-10-CM | POA: Diagnosis not present

## 2019-11-15 DIAGNOSIS — L97329 Non-pressure chronic ulcer of left ankle with unspecified severity: Secondary | ICD-10-CM | POA: Diagnosis not present

## 2019-11-15 DIAGNOSIS — B964 Proteus (mirabilis) (morganii) as the cause of diseases classified elsewhere: Secondary | ICD-10-CM | POA: Diagnosis not present

## 2019-11-15 DIAGNOSIS — T86821 Skin graft (allograft) (autograft) failure: Secondary | ICD-10-CM | POA: Diagnosis not present

## 2019-11-15 DIAGNOSIS — A401 Sepsis due to streptococcus, group B: Secondary | ICD-10-CM | POA: Diagnosis not present

## 2019-11-15 DIAGNOSIS — L97419 Non-pressure chronic ulcer of right heel and midfoot with unspecified severity: Secondary | ICD-10-CM | POA: Diagnosis not present

## 2019-11-15 NOTE — Telephone Encounter (Signed)
-----   Message from Criselda Peaches, DPM sent at 11/14/2019 12:27 PM EDT ----- Regarding: [place],  Can you place orders for home wound care:   change every other day with santyl to left lateral foot ulcer with wet to dry saline dressings, gauze dressings over left and right foot. Has grafts with staples in place that should remain intact.   He is staying with his sister currently and they apparently are known to [facility] and have an active account with them and can come out.  Thanks! Quita Skye

## 2019-11-15 NOTE — Telephone Encounter (Signed)
Attempted to call patient to have scheduled. Number is restricted and unavailable. Letter has been sent to schedule.

## 2019-11-15 NOTE — Telephone Encounter (Signed)
[name], OT states during OT pulse 114 and end of therapy 124.

## 2019-11-15 NOTE — Telephone Encounter (Signed)
[place] form, clinicals and demographics faxed for santyl. Faxed referral form to [facility] for [facility].

## 2019-11-15 NOTE — Telephone Encounter (Signed)
I spoke with [facility], OT and informed in would tell Dr. Sherryle Lis, but pt's PCP would also need to know. Montrell states he will find out the PCP and inform them as well.

## 2019-11-16 DIAGNOSIS — T8611 Kidney transplant rejection: Secondary | ICD-10-CM | POA: Diagnosis not present

## 2019-11-16 DIAGNOSIS — S91302A Unspecified open wound, left foot, initial encounter: Secondary | ICD-10-CM | POA: Diagnosis not present

## 2019-11-16 DIAGNOSIS — J1282 Pneumonia due to coronavirus disease 2019: Secondary | ICD-10-CM | POA: Diagnosis not present

## 2019-11-16 DIAGNOSIS — U071 COVID-19: Secondary | ICD-10-CM | POA: Diagnosis not present

## 2019-11-16 DIAGNOSIS — R079 Chest pain, unspecified: Secondary | ICD-10-CM | POA: Diagnosis not present

## 2019-11-16 DIAGNOSIS — R9431 Abnormal electrocardiogram [ECG] [EKG]: Secondary | ICD-10-CM | POA: Diagnosis not present

## 2019-11-16 DIAGNOSIS — I129 Hypertensive chronic kidney disease with stage 1 through stage 4 chronic kidney disease, or unspecified chronic kidney disease: Secondary | ICD-10-CM | POA: Diagnosis not present

## 2019-11-16 DIAGNOSIS — T8619 Other complication of kidney transplant: Secondary | ICD-10-CM | POA: Diagnosis not present

## 2019-11-16 DIAGNOSIS — I38 Endocarditis, valve unspecified: Secondary | ICD-10-CM | POA: Diagnosis not present

## 2019-11-16 DIAGNOSIS — B964 Proteus (mirabilis) (morganii) as the cause of diseases classified elsewhere: Secondary | ICD-10-CM | POA: Diagnosis not present

## 2019-11-16 DIAGNOSIS — Z794 Long term (current) use of insulin: Secondary | ICD-10-CM | POA: Diagnosis not present

## 2019-11-16 DIAGNOSIS — R630 Anorexia: Secondary | ICD-10-CM | POA: Diagnosis not present

## 2019-11-16 DIAGNOSIS — R531 Weakness: Secondary | ICD-10-CM | POA: Diagnosis not present

## 2019-11-16 DIAGNOSIS — D849 Immunodeficiency, unspecified: Secondary | ICD-10-CM | POA: Diagnosis not present

## 2019-11-16 DIAGNOSIS — S91301A Unspecified open wound, right foot, initial encounter: Secondary | ICD-10-CM | POA: Diagnosis not present

## 2019-11-16 DIAGNOSIS — R410 Disorientation, unspecified: Secondary | ICD-10-CM | POA: Diagnosis not present

## 2019-11-16 DIAGNOSIS — M86672 Other chronic osteomyelitis, left ankle and foot: Secondary | ICD-10-CM | POA: Diagnosis not present

## 2019-11-16 DIAGNOSIS — N186 End stage renal disease: Secondary | ICD-10-CM | POA: Diagnosis not present

## 2019-11-16 DIAGNOSIS — M869 Osteomyelitis, unspecified: Secondary | ICD-10-CM | POA: Diagnosis not present

## 2019-11-16 DIAGNOSIS — L97329 Non-pressure chronic ulcer of left ankle with unspecified severity: Secondary | ICD-10-CM | POA: Diagnosis not present

## 2019-11-16 DIAGNOSIS — E11621 Type 2 diabetes mellitus with foot ulcer: Secondary | ICD-10-CM | POA: Diagnosis not present

## 2019-11-16 DIAGNOSIS — I739 Peripheral vascular disease, unspecified: Secondary | ICD-10-CM | POA: Diagnosis not present

## 2019-11-16 DIAGNOSIS — R519 Headache, unspecified: Secondary | ICD-10-CM | POA: Diagnosis not present

## 2019-11-16 DIAGNOSIS — E1122 Type 2 diabetes mellitus with diabetic chronic kidney disease: Secondary | ICD-10-CM | POA: Diagnosis not present

## 2019-11-16 DIAGNOSIS — N184 Chronic kidney disease, stage 4 (severe): Secondary | ICD-10-CM | POA: Diagnosis not present

## 2019-11-16 DIAGNOSIS — D84821 Immunodeficiency due to drugs: Secondary | ICD-10-CM | POA: Diagnosis not present

## 2019-11-16 DIAGNOSIS — I2510 Atherosclerotic heart disease of native coronary artery without angina pectoris: Secondary | ICD-10-CM | POA: Diagnosis not present

## 2019-11-16 DIAGNOSIS — A409 Streptococcal sepsis, unspecified: Secondary | ICD-10-CM | POA: Diagnosis not present

## 2019-11-16 DIAGNOSIS — A4189 Other specified sepsis: Secondary | ICD-10-CM | POA: Diagnosis not present

## 2019-11-16 DIAGNOSIS — T86821 Skin graft (allograft) (autograft) failure: Secondary | ICD-10-CM | POA: Diagnosis not present

## 2019-11-16 DIAGNOSIS — Z940 Kidney transplant status: Secondary | ICD-10-CM | POA: Diagnosis not present

## 2019-11-16 DIAGNOSIS — R509 Fever, unspecified: Secondary | ICD-10-CM | POA: Diagnosis not present

## 2019-11-16 DIAGNOSIS — G9341 Metabolic encephalopathy: Secondary | ICD-10-CM | POA: Diagnosis not present

## 2019-11-16 DIAGNOSIS — L97429 Non-pressure chronic ulcer of left heel and midfoot with unspecified severity: Secondary | ICD-10-CM | POA: Diagnosis not present

## 2019-11-16 DIAGNOSIS — A412 Sepsis due to unspecified staphylococcus: Secondary | ICD-10-CM | POA: Diagnosis not present

## 2019-11-16 DIAGNOSIS — E1152 Type 2 diabetes mellitus with diabetic peripheral angiopathy with gangrene: Secondary | ICD-10-CM | POA: Diagnosis not present

## 2019-11-16 DIAGNOSIS — R7881 Bacteremia: Secondary | ICD-10-CM | POA: Diagnosis not present

## 2019-11-16 DIAGNOSIS — L03116 Cellulitis of left lower limb: Secondary | ICD-10-CM | POA: Diagnosis not present

## 2019-11-16 DIAGNOSIS — L97529 Non-pressure chronic ulcer of other part of left foot with unspecified severity: Secondary | ICD-10-CM | POA: Diagnosis not present

## 2019-11-16 DIAGNOSIS — Z0389 Encounter for observation for other suspected diseases and conditions ruled out: Secondary | ICD-10-CM | POA: Diagnosis not present

## 2019-11-16 DIAGNOSIS — E1165 Type 2 diabetes mellitus with hyperglycemia: Secondary | ICD-10-CM | POA: Diagnosis not present

## 2019-11-16 DIAGNOSIS — L03115 Cellulitis of right lower limb: Secondary | ICD-10-CM | POA: Diagnosis not present

## 2019-11-16 DIAGNOSIS — A401 Sepsis due to streptococcus, group B: Secondary | ICD-10-CM | POA: Diagnosis not present

## 2019-11-16 DIAGNOSIS — L97419 Non-pressure chronic ulcer of right heel and midfoot with unspecified severity: Secondary | ICD-10-CM | POA: Diagnosis not present

## 2019-11-16 DIAGNOSIS — I96 Gangrene, not elsewhere classified: Secondary | ICD-10-CM | POA: Diagnosis not present

## 2019-11-16 DIAGNOSIS — N189 Chronic kidney disease, unspecified: Secondary | ICD-10-CM | POA: Diagnosis not present

## 2019-11-16 DIAGNOSIS — A491 Streptococcal infection, unspecified site: Secondary | ICD-10-CM | POA: Diagnosis not present

## 2019-11-16 DIAGNOSIS — L97509 Non-pressure chronic ulcer of other part of unspecified foot with unspecified severity: Secondary | ICD-10-CM | POA: Diagnosis not present

## 2019-11-16 DIAGNOSIS — E1169 Type 2 diabetes mellitus with other specified complication: Secondary | ICD-10-CM | POA: Diagnosis not present

## 2019-11-16 DIAGNOSIS — E871 Hypo-osmolality and hyponatremia: Secondary | ICD-10-CM | POA: Diagnosis not present

## 2019-11-16 DIAGNOSIS — A419 Sepsis, unspecified organism: Secondary | ICD-10-CM | POA: Diagnosis not present

## 2019-11-16 DIAGNOSIS — R000 Tachycardia, unspecified: Secondary | ICD-10-CM | POA: Diagnosis not present

## 2019-11-16 DIAGNOSIS — N179 Acute kidney failure, unspecified: Secondary | ICD-10-CM | POA: Diagnosis not present

## 2019-11-16 DIAGNOSIS — R918 Other nonspecific abnormal finding of lung field: Secondary | ICD-10-CM | POA: Diagnosis not present

## 2019-11-19 NOTE — Unmapped (Signed)
Spoke to Diane with [facility] kidney transplant ([phone]).  Pt admitted there on 7.30 for sepsis with COVID 19.  Infection to foot wounds.    Diane calling asking for recent clinic notes on patient as patient is not as alert to give full medical history.  Sent last clinic visit to her and gave her number for his sister [name].    Faxed recent clinic note [phone] and gave history on patient. Asked her to let me know if she has any other questions.    Updated Dr Margaretmary Bayley

## 2019-11-19 NOTE — Unmapped (Signed)
[facility] form update

## 2019-11-20 ENCOUNTER — Telehealth: Payer: Self-pay | Admitting: Sports Medicine

## 2019-11-20 NOTE — Telephone Encounter (Signed)
I spoke with the sister. Thanks Dr. Chauncey Cruel

## 2019-11-20 NOTE — Telephone Encounter (Signed)
Pts sister called stating pt has been admitted to the hospital in [place] and the doctors are wanting to do another debridement and possible amputation.   Pts sister would like a call back from Dr Cannon Kettle to discuss before having them move forward with treatment in [place].

## 2019-11-20 NOTE — Unmapped (Signed)
Returned VM to sister Benjamin Maldonado [phone]    Left VM letting her know that yes, I was notified yesterday that her brother was in the hospital    Christy called again and let me know he is doing OK.  Wants to be transferred to [facility]. Had his left foot debrided and needs it again with possible amputation. Family understands the need for amputation but patient not agreeing at this time.  Encouraged her to talk with his care team to see if there are other resources for patient.  She can also discuss transfer with team there but that [facility] may be full.    Will call with any additional questions or concerns

## 2019-11-22 ENCOUNTER — Ambulatory Visit: Payer: Medicare HMO | Admitting: Sports Medicine

## 2019-12-10 DIAGNOSIS — L97419 Non-pressure chronic ulcer of right heel and midfoot with unspecified severity: Secondary | ICD-10-CM | POA: Diagnosis not present

## 2019-12-10 DIAGNOSIS — N179 Acute kidney failure, unspecified: Secondary | ICD-10-CM | POA: Diagnosis not present

## 2019-12-10 DIAGNOSIS — T86821 Skin graft (allograft) (autograft) failure: Secondary | ICD-10-CM | POA: Diagnosis not present

## 2019-12-10 DIAGNOSIS — B964 Proteus (mirabilis) (morganii) as the cause of diseases classified elsewhere: Secondary | ICD-10-CM | POA: Diagnosis not present

## 2019-12-10 DIAGNOSIS — E11621 Type 2 diabetes mellitus with foot ulcer: Secondary | ICD-10-CM | POA: Diagnosis not present

## 2019-12-10 DIAGNOSIS — A401 Sepsis due to streptococcus, group B: Secondary | ICD-10-CM | POA: Diagnosis not present

## 2019-12-10 DIAGNOSIS — L97329 Non-pressure chronic ulcer of left ankle with unspecified severity: Secondary | ICD-10-CM | POA: Diagnosis not present

## 2019-12-10 DIAGNOSIS — L97429 Non-pressure chronic ulcer of left heel and midfoot with unspecified severity: Secondary | ICD-10-CM | POA: Diagnosis not present

## 2019-12-10 DIAGNOSIS — G9341 Metabolic encephalopathy: Secondary | ICD-10-CM | POA: Diagnosis not present

## 2019-12-14 NOTE — Unmapped (Signed)
The [facility] has made a third and final attempt to reach this patient to refill the following medication:tacrolimus, mycophenolate.      We have left voicemails on the following phone numbers: [phone] one time, mailbox full on next 2 attempts.    Dates contacted: 8/17, 8/23, 8/27  Last scheduled delivery: 7/28- based on fill history patient may be out of medication today    The patient may be at risk of non-compliance with this medication. The patient should call the [facility] at [phone] (option 4) to refill medication.    [name]   [facility] Specialty Pharmacist

## 2019-12-19 DIAGNOSIS — G9341 Metabolic encephalopathy: Secondary | ICD-10-CM | POA: Diagnosis not present

## 2019-12-19 DIAGNOSIS — A401 Sepsis due to streptococcus, group B: Secondary | ICD-10-CM | POA: Diagnosis not present

## 2019-12-19 DIAGNOSIS — L97329 Non-pressure chronic ulcer of left ankle with unspecified severity: Secondary | ICD-10-CM | POA: Diagnosis not present

## 2019-12-19 DIAGNOSIS — L97429 Non-pressure chronic ulcer of left heel and midfoot with unspecified severity: Secondary | ICD-10-CM | POA: Diagnosis not present

## 2019-12-19 DIAGNOSIS — T86821 Skin graft (allograft) (autograft) failure: Secondary | ICD-10-CM | POA: Diagnosis not present

## 2019-12-19 DIAGNOSIS — N179 Acute kidney failure, unspecified: Secondary | ICD-10-CM | POA: Diagnosis not present

## 2019-12-19 DIAGNOSIS — E11621 Type 2 diabetes mellitus with foot ulcer: Secondary | ICD-10-CM | POA: Diagnosis not present

## 2019-12-19 DIAGNOSIS — L97419 Non-pressure chronic ulcer of right heel and midfoot with unspecified severity: Secondary | ICD-10-CM | POA: Diagnosis not present

## 2019-12-19 DIAGNOSIS — B964 Proteus (mirabilis) (morganii) as the cause of diseases classified elsewhere: Secondary | ICD-10-CM | POA: Diagnosis not present

## 2019-12-20 DIAGNOSIS — L97429 Non-pressure chronic ulcer of left heel and midfoot with unspecified severity: Secondary | ICD-10-CM | POA: Diagnosis not present

## 2019-12-20 DIAGNOSIS — A401 Sepsis due to streptococcus, group B: Secondary | ICD-10-CM | POA: Diagnosis not present

## 2019-12-20 DIAGNOSIS — G9341 Metabolic encephalopathy: Secondary | ICD-10-CM | POA: Diagnosis not present

## 2019-12-20 DIAGNOSIS — B964 Proteus (mirabilis) (morganii) as the cause of diseases classified elsewhere: Secondary | ICD-10-CM | POA: Diagnosis not present

## 2019-12-20 DIAGNOSIS — T86821 Skin graft (allograft) (autograft) failure: Secondary | ICD-10-CM | POA: Diagnosis not present

## 2019-12-20 DIAGNOSIS — L97419 Non-pressure chronic ulcer of right heel and midfoot with unspecified severity: Secondary | ICD-10-CM | POA: Diagnosis not present

## 2019-12-20 DIAGNOSIS — E11621 Type 2 diabetes mellitus with foot ulcer: Secondary | ICD-10-CM | POA: Diagnosis not present

## 2019-12-20 DIAGNOSIS — L97329 Non-pressure chronic ulcer of left ankle with unspecified severity: Secondary | ICD-10-CM | POA: Diagnosis not present

## 2019-12-20 DIAGNOSIS — N179 Acute kidney failure, unspecified: Secondary | ICD-10-CM | POA: Diagnosis not present

## 2019-12-20 NOTE — Unmapped (Signed)
Patient called to let me know he will be unable to come to his apt tomorrow since he just got home from rehab and he needs to reschedule his apt.    Called and left patient VM that I will cancel his apts and will reach out to [facility] to reschedule

## 2019-12-21 DIAGNOSIS — Z940 Kidney transplant status: Principal | ICD-10-CM

## 2019-12-21 DIAGNOSIS — N186 End stage renal disease: Principal | ICD-10-CM

## 2019-12-21 DIAGNOSIS — Z794 Long term (current) use of insulin: Principal | ICD-10-CM

## 2019-12-21 DIAGNOSIS — E1165 Type 2 diabetes mellitus with hyperglycemia: Principal | ICD-10-CM

## 2019-12-21 NOTE — Unmapped (Signed)
Patient called to verify tac dose. States he has always taken 6mg  BID but on his new prescription it says 4mg  BID. Just discharged from rehab on Tuesday and unable to see any recent tac levels in [facility]. Advised him to stay on discharge dose and get labs next week to recheck level.    [facility]    Denies any other needs

## 2019-12-26 DIAGNOSIS — L97429 Non-pressure chronic ulcer of left heel and midfoot with unspecified severity: Secondary | ICD-10-CM | POA: Diagnosis not present

## 2019-12-26 DIAGNOSIS — T86821 Skin graft (allograft) (autograft) failure: Secondary | ICD-10-CM | POA: Diagnosis not present

## 2019-12-26 DIAGNOSIS — A401 Sepsis due to streptococcus, group B: Secondary | ICD-10-CM | POA: Diagnosis not present

## 2019-12-26 DIAGNOSIS — B964 Proteus (mirabilis) (morganii) as the cause of diseases classified elsewhere: Secondary | ICD-10-CM | POA: Diagnosis not present

## 2019-12-26 DIAGNOSIS — L97419 Non-pressure chronic ulcer of right heel and midfoot with unspecified severity: Secondary | ICD-10-CM | POA: Diagnosis not present

## 2019-12-26 DIAGNOSIS — N179 Acute kidney failure, unspecified: Secondary | ICD-10-CM | POA: Diagnosis not present

## 2019-12-26 DIAGNOSIS — G9341 Metabolic encephalopathy: Secondary | ICD-10-CM | POA: Diagnosis not present

## 2019-12-26 DIAGNOSIS — E11621 Type 2 diabetes mellitus with foot ulcer: Secondary | ICD-10-CM | POA: Diagnosis not present

## 2019-12-26 DIAGNOSIS — L97329 Non-pressure chronic ulcer of left ankle with unspecified severity: Secondary | ICD-10-CM | POA: Diagnosis not present

## 2019-12-28 DIAGNOSIS — L97429 Non-pressure chronic ulcer of left heel and midfoot with unspecified severity: Secondary | ICD-10-CM | POA: Diagnosis not present

## 2019-12-28 DIAGNOSIS — M6281 Muscle weakness (generalized): Secondary | ICD-10-CM | POA: Diagnosis not present

## 2019-12-28 DIAGNOSIS — A401 Sepsis due to streptococcus, group B: Secondary | ICD-10-CM | POA: Diagnosis not present

## 2019-12-28 DIAGNOSIS — G9341 Metabolic encephalopathy: Secondary | ICD-10-CM | POA: Diagnosis not present

## 2019-12-28 DIAGNOSIS — E11621 Type 2 diabetes mellitus with foot ulcer: Secondary | ICD-10-CM | POA: Diagnosis not present

## 2019-12-28 DIAGNOSIS — N179 Acute kidney failure, unspecified: Secondary | ICD-10-CM | POA: Diagnosis not present

## 2019-12-28 DIAGNOSIS — L97329 Non-pressure chronic ulcer of left ankle with unspecified severity: Secondary | ICD-10-CM | POA: Diagnosis not present

## 2019-12-28 DIAGNOSIS — T86821 Skin graft (allograft) (autograft) failure: Secondary | ICD-10-CM | POA: Diagnosis not present

## 2019-12-28 DIAGNOSIS — G4733 Obstructive sleep apnea (adult) (pediatric): Secondary | ICD-10-CM | POA: Diagnosis not present

## 2019-12-28 DIAGNOSIS — B964 Proteus (mirabilis) (morganii) as the cause of diseases classified elsewhere: Secondary | ICD-10-CM | POA: Diagnosis not present

## 2019-12-28 DIAGNOSIS — L97419 Non-pressure chronic ulcer of right heel and midfoot with unspecified severity: Secondary | ICD-10-CM | POA: Diagnosis not present

## 2019-12-29 DIAGNOSIS — L97329 Non-pressure chronic ulcer of left ankle with unspecified severity: Secondary | ICD-10-CM | POA: Diagnosis not present

## 2019-12-29 DIAGNOSIS — A401 Sepsis due to streptococcus, group B: Secondary | ICD-10-CM | POA: Diagnosis not present

## 2019-12-29 DIAGNOSIS — T86821 Skin graft (allograft) (autograft) failure: Secondary | ICD-10-CM | POA: Diagnosis not present

## 2019-12-29 DIAGNOSIS — E11621 Type 2 diabetes mellitus with foot ulcer: Secondary | ICD-10-CM | POA: Diagnosis not present

## 2019-12-29 DIAGNOSIS — N179 Acute kidney failure, unspecified: Secondary | ICD-10-CM | POA: Diagnosis not present

## 2019-12-29 DIAGNOSIS — B964 Proteus (mirabilis) (morganii) as the cause of diseases classified elsewhere: Secondary | ICD-10-CM | POA: Diagnosis not present

## 2019-12-29 DIAGNOSIS — L97419 Non-pressure chronic ulcer of right heel and midfoot with unspecified severity: Secondary | ICD-10-CM | POA: Diagnosis not present

## 2019-12-29 DIAGNOSIS — G9341 Metabolic encephalopathy: Secondary | ICD-10-CM | POA: Diagnosis not present

## 2019-12-29 DIAGNOSIS — L97429 Non-pressure chronic ulcer of left heel and midfoot with unspecified severity: Secondary | ICD-10-CM | POA: Diagnosis not present

## 2019-12-31 DIAGNOSIS — G9341 Metabolic encephalopathy: Secondary | ICD-10-CM | POA: Diagnosis not present

## 2019-12-31 DIAGNOSIS — E1152 Type 2 diabetes mellitus with diabetic peripheral angiopathy with gangrene: Secondary | ICD-10-CM | POA: Diagnosis not present

## 2019-12-31 DIAGNOSIS — Z713 Dietary counseling and surveillance: Secondary | ICD-10-CM | POA: Diagnosis not present

## 2019-12-31 DIAGNOSIS — T86821 Skin graft (allograft) (autograft) failure: Secondary | ICD-10-CM | POA: Diagnosis not present

## 2019-12-31 DIAGNOSIS — L97512 Non-pressure chronic ulcer of other part of right foot with fat layer exposed: Secondary | ICD-10-CM | POA: Diagnosis not present

## 2019-12-31 DIAGNOSIS — Z940 Kidney transplant status: Secondary | ICD-10-CM | POA: Diagnosis not present

## 2019-12-31 DIAGNOSIS — L97419 Non-pressure chronic ulcer of right heel and midfoot with unspecified severity: Secondary | ICD-10-CM | POA: Diagnosis not present

## 2019-12-31 DIAGNOSIS — L97329 Non-pressure chronic ulcer of left ankle with unspecified severity: Secondary | ICD-10-CM | POA: Diagnosis not present

## 2019-12-31 DIAGNOSIS — Z89512 Acquired absence of left leg below knee: Secondary | ICD-10-CM | POA: Diagnosis not present

## 2019-12-31 DIAGNOSIS — N179 Acute kidney failure, unspecified: Secondary | ICD-10-CM | POA: Diagnosis not present

## 2019-12-31 DIAGNOSIS — L97429 Non-pressure chronic ulcer of left heel and midfoot with unspecified severity: Secondary | ICD-10-CM | POA: Diagnosis not present

## 2019-12-31 DIAGNOSIS — A401 Sepsis due to streptococcus, group B: Secondary | ICD-10-CM | POA: Diagnosis not present

## 2019-12-31 DIAGNOSIS — Z794 Long term (current) use of insulin: Secondary | ICD-10-CM | POA: Diagnosis not present

## 2019-12-31 DIAGNOSIS — B964 Proteus (mirabilis) (morganii) as the cause of diseases classified elsewhere: Secondary | ICD-10-CM | POA: Diagnosis not present

## 2019-12-31 DIAGNOSIS — E11621 Type 2 diabetes mellitus with foot ulcer: Secondary | ICD-10-CM | POA: Diagnosis not present

## 2019-12-31 DIAGNOSIS — I96 Gangrene, not elsewhere classified: Secondary | ICD-10-CM | POA: Diagnosis not present

## 2020-01-02 DIAGNOSIS — G9341 Metabolic encephalopathy: Secondary | ICD-10-CM | POA: Diagnosis not present

## 2020-01-02 DIAGNOSIS — E11621 Type 2 diabetes mellitus with foot ulcer: Secondary | ICD-10-CM | POA: Diagnosis not present

## 2020-01-02 DIAGNOSIS — L97419 Non-pressure chronic ulcer of right heel and midfoot with unspecified severity: Secondary | ICD-10-CM | POA: Diagnosis not present

## 2020-01-02 DIAGNOSIS — A401 Sepsis due to streptococcus, group B: Secondary | ICD-10-CM | POA: Diagnosis not present

## 2020-01-02 DIAGNOSIS — N179 Acute kidney failure, unspecified: Secondary | ICD-10-CM | POA: Diagnosis not present

## 2020-01-02 DIAGNOSIS — L97329 Non-pressure chronic ulcer of left ankle with unspecified severity: Secondary | ICD-10-CM | POA: Diagnosis not present

## 2020-01-02 DIAGNOSIS — L97429 Non-pressure chronic ulcer of left heel and midfoot with unspecified severity: Secondary | ICD-10-CM | POA: Diagnosis not present

## 2020-01-02 DIAGNOSIS — T86821 Skin graft (allograft) (autograft) failure: Secondary | ICD-10-CM | POA: Diagnosis not present

## 2020-01-02 DIAGNOSIS — B964 Proteus (mirabilis) (morganii) as the cause of diseases classified elsewhere: Secondary | ICD-10-CM | POA: Diagnosis not present

## 2020-01-03 DIAGNOSIS — G9341 Metabolic encephalopathy: Secondary | ICD-10-CM | POA: Diagnosis not present

## 2020-01-03 DIAGNOSIS — E11621 Type 2 diabetes mellitus with foot ulcer: Secondary | ICD-10-CM | POA: Diagnosis not present

## 2020-01-03 DIAGNOSIS — L97429 Non-pressure chronic ulcer of left heel and midfoot with unspecified severity: Secondary | ICD-10-CM | POA: Diagnosis not present

## 2020-01-03 DIAGNOSIS — B964 Proteus (mirabilis) (morganii) as the cause of diseases classified elsewhere: Secondary | ICD-10-CM | POA: Diagnosis not present

## 2020-01-03 DIAGNOSIS — L97419 Non-pressure chronic ulcer of right heel and midfoot with unspecified severity: Secondary | ICD-10-CM | POA: Diagnosis not present

## 2020-01-03 DIAGNOSIS — L97329 Non-pressure chronic ulcer of left ankle with unspecified severity: Secondary | ICD-10-CM | POA: Diagnosis not present

## 2020-01-03 DIAGNOSIS — N179 Acute kidney failure, unspecified: Secondary | ICD-10-CM | POA: Diagnosis not present

## 2020-01-03 DIAGNOSIS — A401 Sepsis due to streptococcus, group B: Secondary | ICD-10-CM | POA: Diagnosis not present

## 2020-01-03 DIAGNOSIS — T86821 Skin graft (allograft) (autograft) failure: Secondary | ICD-10-CM | POA: Diagnosis not present

## 2020-01-04 DIAGNOSIS — E11621 Type 2 diabetes mellitus with foot ulcer: Secondary | ICD-10-CM | POA: Diagnosis not present

## 2020-01-04 DIAGNOSIS — L97329 Non-pressure chronic ulcer of left ankle with unspecified severity: Secondary | ICD-10-CM | POA: Diagnosis not present

## 2020-01-04 DIAGNOSIS — T86821 Skin graft (allograft) (autograft) failure: Secondary | ICD-10-CM | POA: Diagnosis not present

## 2020-01-04 DIAGNOSIS — L97419 Non-pressure chronic ulcer of right heel and midfoot with unspecified severity: Secondary | ICD-10-CM | POA: Diagnosis not present

## 2020-01-04 DIAGNOSIS — G9341 Metabolic encephalopathy: Secondary | ICD-10-CM | POA: Diagnosis not present

## 2020-01-04 DIAGNOSIS — L97429 Non-pressure chronic ulcer of left heel and midfoot with unspecified severity: Secondary | ICD-10-CM | POA: Diagnosis not present

## 2020-01-04 DIAGNOSIS — B964 Proteus (mirabilis) (morganii) as the cause of diseases classified elsewhere: Secondary | ICD-10-CM | POA: Diagnosis not present

## 2020-01-04 DIAGNOSIS — N179 Acute kidney failure, unspecified: Secondary | ICD-10-CM | POA: Diagnosis not present

## 2020-01-04 DIAGNOSIS — A401 Sepsis due to streptococcus, group B: Secondary | ICD-10-CM | POA: Diagnosis not present

## 2020-01-07 DIAGNOSIS — T86821 Skin graft (allograft) (autograft) failure: Secondary | ICD-10-CM | POA: Diagnosis not present

## 2020-01-07 DIAGNOSIS — N179 Acute kidney failure, unspecified: Secondary | ICD-10-CM | POA: Diagnosis not present

## 2020-01-07 DIAGNOSIS — A401 Sepsis due to streptococcus, group B: Secondary | ICD-10-CM | POA: Diagnosis not present

## 2020-01-07 DIAGNOSIS — G9341 Metabolic encephalopathy: Secondary | ICD-10-CM | POA: Diagnosis not present

## 2020-01-07 DIAGNOSIS — B964 Proteus (mirabilis) (morganii) as the cause of diseases classified elsewhere: Secondary | ICD-10-CM | POA: Diagnosis not present

## 2020-01-07 DIAGNOSIS — L97429 Non-pressure chronic ulcer of left heel and midfoot with unspecified severity: Secondary | ICD-10-CM | POA: Diagnosis not present

## 2020-01-07 DIAGNOSIS — L97329 Non-pressure chronic ulcer of left ankle with unspecified severity: Secondary | ICD-10-CM | POA: Diagnosis not present

## 2020-01-07 DIAGNOSIS — L97419 Non-pressure chronic ulcer of right heel and midfoot with unspecified severity: Secondary | ICD-10-CM | POA: Diagnosis not present

## 2020-01-07 DIAGNOSIS — E11621 Type 2 diabetes mellitus with foot ulcer: Secondary | ICD-10-CM | POA: Diagnosis not present

## 2020-01-08 DIAGNOSIS — L97419 Non-pressure chronic ulcer of right heel and midfoot with unspecified severity: Secondary | ICD-10-CM | POA: Diagnosis not present

## 2020-01-08 DIAGNOSIS — L97429 Non-pressure chronic ulcer of left heel and midfoot with unspecified severity: Secondary | ICD-10-CM | POA: Diagnosis not present

## 2020-01-08 DIAGNOSIS — E11621 Type 2 diabetes mellitus with foot ulcer: Secondary | ICD-10-CM | POA: Diagnosis not present

## 2020-01-08 DIAGNOSIS — N179 Acute kidney failure, unspecified: Secondary | ICD-10-CM | POA: Diagnosis not present

## 2020-01-08 DIAGNOSIS — B964 Proteus (mirabilis) (morganii) as the cause of diseases classified elsewhere: Secondary | ICD-10-CM | POA: Diagnosis not present

## 2020-01-08 DIAGNOSIS — T86821 Skin graft (allograft) (autograft) failure: Secondary | ICD-10-CM | POA: Diagnosis not present

## 2020-01-08 DIAGNOSIS — A401 Sepsis due to streptococcus, group B: Secondary | ICD-10-CM | POA: Diagnosis not present

## 2020-01-08 DIAGNOSIS — G9341 Metabolic encephalopathy: Secondary | ICD-10-CM | POA: Diagnosis not present

## 2020-01-08 DIAGNOSIS — L97329 Non-pressure chronic ulcer of left ankle with unspecified severity: Secondary | ICD-10-CM | POA: Diagnosis not present

## 2020-01-09 DIAGNOSIS — I208 Other forms of angina pectoris: Secondary | ICD-10-CM | POA: Diagnosis not present

## 2020-01-09 DIAGNOSIS — R0789 Other chest pain: Secondary | ICD-10-CM | POA: Diagnosis not present

## 2020-01-09 DIAGNOSIS — E1169 Type 2 diabetes mellitus with other specified complication: Secondary | ICD-10-CM | POA: Diagnosis not present

## 2020-01-09 DIAGNOSIS — E1122 Type 2 diabetes mellitus with diabetic chronic kidney disease: Secondary | ICD-10-CM | POA: Diagnosis not present

## 2020-01-09 DIAGNOSIS — N189 Chronic kidney disease, unspecified: Secondary | ICD-10-CM | POA: Diagnosis not present

## 2020-01-09 DIAGNOSIS — I2510 Atherosclerotic heart disease of native coronary artery without angina pectoris: Secondary | ICD-10-CM | POA: Diagnosis not present

## 2020-01-09 DIAGNOSIS — J90 Pleural effusion, not elsewhere classified: Secondary | ICD-10-CM | POA: Diagnosis not present

## 2020-01-09 DIAGNOSIS — E785 Hyperlipidemia, unspecified: Secondary | ICD-10-CM | POA: Diagnosis not present

## 2020-01-09 DIAGNOSIS — A401 Sepsis due to streptococcus, group B: Secondary | ICD-10-CM | POA: Diagnosis not present

## 2020-01-09 DIAGNOSIS — J9811 Atelectasis: Secondary | ICD-10-CM | POA: Diagnosis not present

## 2020-01-09 DIAGNOSIS — R079 Chest pain, unspecified: Secondary | ICD-10-CM | POA: Diagnosis not present

## 2020-01-09 DIAGNOSIS — J189 Pneumonia, unspecified organism: Secondary | ICD-10-CM | POA: Diagnosis not present

## 2020-01-10 DIAGNOSIS — R0789 Other chest pain: Secondary | ICD-10-CM | POA: Diagnosis not present

## 2020-01-10 DIAGNOSIS — E1169 Type 2 diabetes mellitus with other specified complication: Secondary | ICD-10-CM | POA: Diagnosis not present

## 2020-01-10 DIAGNOSIS — Z940 Kidney transplant status: Secondary | ICD-10-CM | POA: Diagnosis not present

## 2020-01-10 DIAGNOSIS — I2510 Atherosclerotic heart disease of native coronary artery without angina pectoris: Secondary | ICD-10-CM | POA: Diagnosis not present

## 2020-01-10 DIAGNOSIS — N189 Chronic kidney disease, unspecified: Secondary | ICD-10-CM | POA: Diagnosis not present

## 2020-01-10 DIAGNOSIS — R0609 Other forms of dyspnea: Secondary | ICD-10-CM | POA: Diagnosis not present

## 2020-01-10 DIAGNOSIS — I120 Hypertensive chronic kidney disease with stage 5 chronic kidney disease or end stage renal disease: Secondary | ICD-10-CM | POA: Diagnosis not present

## 2020-01-10 DIAGNOSIS — R079 Chest pain, unspecified: Secondary | ICD-10-CM | POA: Diagnosis not present

## 2020-01-10 DIAGNOSIS — J9811 Atelectasis: Secondary | ICD-10-CM | POA: Diagnosis not present

## 2020-01-10 DIAGNOSIS — E11319 Type 2 diabetes mellitus with unspecified diabetic retinopathy without macular edema: Secondary | ICD-10-CM | POA: Diagnosis not present

## 2020-01-10 DIAGNOSIS — E441 Mild protein-calorie malnutrition: Secondary | ICD-10-CM | POA: Diagnosis not present

## 2020-01-10 DIAGNOSIS — E785 Hyperlipidemia, unspecified: Secondary | ICD-10-CM | POA: Diagnosis not present

## 2020-01-10 DIAGNOSIS — J189 Pneumonia, unspecified organism: Secondary | ICD-10-CM | POA: Diagnosis not present

## 2020-01-10 DIAGNOSIS — E1122 Type 2 diabetes mellitus with diabetic chronic kidney disease: Secondary | ICD-10-CM | POA: Diagnosis not present

## 2020-01-10 DIAGNOSIS — J90 Pleural effusion, not elsewhere classified: Secondary | ICD-10-CM | POA: Diagnosis not present

## 2020-01-10 DIAGNOSIS — I208 Other forms of angina pectoris: Secondary | ICD-10-CM | POA: Diagnosis not present

## 2020-01-11 DIAGNOSIS — N189 Chronic kidney disease, unspecified: Secondary | ICD-10-CM | POA: Diagnosis not present

## 2020-01-11 DIAGNOSIS — I2510 Atherosclerotic heart disease of native coronary artery without angina pectoris: Secondary | ICD-10-CM | POA: Diagnosis not present

## 2020-01-11 DIAGNOSIS — R079 Chest pain, unspecified: Secondary | ICD-10-CM | POA: Diagnosis not present

## 2020-01-11 DIAGNOSIS — J90 Pleural effusion, not elsewhere classified: Secondary | ICD-10-CM | POA: Diagnosis not present

## 2020-01-11 DIAGNOSIS — E1169 Type 2 diabetes mellitus with other specified complication: Secondary | ICD-10-CM | POA: Diagnosis not present

## 2020-01-12 DIAGNOSIS — E1169 Type 2 diabetes mellitus with other specified complication: Secondary | ICD-10-CM | POA: Diagnosis not present

## 2020-01-12 DIAGNOSIS — R079 Chest pain, unspecified: Secondary | ICD-10-CM | POA: Diagnosis not present

## 2020-01-12 DIAGNOSIS — I2510 Atherosclerotic heart disease of native coronary artery without angina pectoris: Secondary | ICD-10-CM | POA: Diagnosis not present

## 2020-01-12 DIAGNOSIS — N189 Chronic kidney disease, unspecified: Secondary | ICD-10-CM | POA: Diagnosis not present

## 2020-01-12 DIAGNOSIS — J90 Pleural effusion, not elsewhere classified: Secondary | ICD-10-CM | POA: Diagnosis not present

## 2020-01-13 DIAGNOSIS — J90 Pleural effusion, not elsewhere classified: Secondary | ICD-10-CM | POA: Diagnosis not present

## 2020-01-13 DIAGNOSIS — I2510 Atherosclerotic heart disease of native coronary artery without angina pectoris: Secondary | ICD-10-CM | POA: Diagnosis not present

## 2020-01-13 DIAGNOSIS — R079 Chest pain, unspecified: Secondary | ICD-10-CM | POA: Diagnosis not present

## 2020-01-13 DIAGNOSIS — N189 Chronic kidney disease, unspecified: Secondary | ICD-10-CM | POA: Diagnosis not present

## 2020-01-13 DIAGNOSIS — E1169 Type 2 diabetes mellitus with other specified complication: Secondary | ICD-10-CM | POA: Diagnosis not present

## 2020-01-14 DIAGNOSIS — Z940 Kidney transplant status: Principal | ICD-10-CM

## 2020-01-14 DIAGNOSIS — E1152 Type 2 diabetes mellitus with diabetic peripheral angiopathy with gangrene: Secondary | ICD-10-CM | POA: Diagnosis not present

## 2020-01-14 DIAGNOSIS — E11621 Type 2 diabetes mellitus with foot ulcer: Secondary | ICD-10-CM | POA: Diagnosis not present

## 2020-01-14 DIAGNOSIS — Z713 Dietary counseling and surveillance: Secondary | ICD-10-CM | POA: Diagnosis not present

## 2020-01-14 DIAGNOSIS — I96 Gangrene, not elsewhere classified: Secondary | ICD-10-CM | POA: Diagnosis not present

## 2020-01-14 DIAGNOSIS — L97512 Non-pressure chronic ulcer of other part of right foot with fat layer exposed: Secondary | ICD-10-CM | POA: Diagnosis not present

## 2020-01-14 DIAGNOSIS — Z794 Long term (current) use of insulin: Secondary | ICD-10-CM | POA: Diagnosis not present

## 2020-01-14 DIAGNOSIS — Z89512 Acquired absence of left leg below knee: Secondary | ICD-10-CM | POA: Diagnosis not present

## 2020-01-15 DIAGNOSIS — I2510 Atherosclerotic heart disease of native coronary artery without angina pectoris: Secondary | ICD-10-CM | POA: Diagnosis not present

## 2020-01-15 DIAGNOSIS — L97511 Non-pressure chronic ulcer of other part of right foot limited to breakdown of skin: Secondary | ICD-10-CM | POA: Diagnosis not present

## 2020-01-15 DIAGNOSIS — I129 Hypertensive chronic kidney disease with stage 1 through stage 4 chronic kidney disease, or unspecified chronic kidney disease: Secondary | ICD-10-CM | POA: Diagnosis not present

## 2020-01-15 DIAGNOSIS — I70203 Unspecified atherosclerosis of native arteries of extremities, bilateral legs: Secondary | ICD-10-CM | POA: Diagnosis not present

## 2020-01-15 DIAGNOSIS — E1151 Type 2 diabetes mellitus with diabetic peripheral angiopathy without gangrene: Secondary | ICD-10-CM | POA: Diagnosis not present

## 2020-01-15 DIAGNOSIS — N184 Chronic kidney disease, stage 4 (severe): Secondary | ICD-10-CM | POA: Diagnosis not present

## 2020-01-15 DIAGNOSIS — E1122 Type 2 diabetes mellitus with diabetic chronic kidney disease: Secondary | ICD-10-CM | POA: Diagnosis not present

## 2020-01-15 DIAGNOSIS — E11621 Type 2 diabetes mellitus with foot ulcer: Secondary | ICD-10-CM | POA: Diagnosis not present

## 2020-01-15 DIAGNOSIS — D631 Anemia in chronic kidney disease: Secondary | ICD-10-CM | POA: Diagnosis not present

## 2020-01-16 DIAGNOSIS — L97511 Non-pressure chronic ulcer of other part of right foot limited to breakdown of skin: Secondary | ICD-10-CM | POA: Diagnosis not present

## 2020-01-16 DIAGNOSIS — I129 Hypertensive chronic kidney disease with stage 1 through stage 4 chronic kidney disease, or unspecified chronic kidney disease: Secondary | ICD-10-CM | POA: Diagnosis not present

## 2020-01-16 DIAGNOSIS — E1122 Type 2 diabetes mellitus with diabetic chronic kidney disease: Secondary | ICD-10-CM | POA: Diagnosis not present

## 2020-01-16 DIAGNOSIS — N184 Chronic kidney disease, stage 4 (severe): Secondary | ICD-10-CM | POA: Diagnosis not present

## 2020-01-16 DIAGNOSIS — E1151 Type 2 diabetes mellitus with diabetic peripheral angiopathy without gangrene: Secondary | ICD-10-CM | POA: Diagnosis not present

## 2020-01-16 DIAGNOSIS — D631 Anemia in chronic kidney disease: Secondary | ICD-10-CM | POA: Diagnosis not present

## 2020-01-16 DIAGNOSIS — E11621 Type 2 diabetes mellitus with foot ulcer: Secondary | ICD-10-CM | POA: Diagnosis not present

## 2020-01-16 DIAGNOSIS — I70203 Unspecified atherosclerosis of native arteries of extremities, bilateral legs: Secondary | ICD-10-CM | POA: Diagnosis not present

## 2020-01-16 DIAGNOSIS — I2510 Atherosclerotic heart disease of native coronary artery without angina pectoris: Secondary | ICD-10-CM | POA: Diagnosis not present

## 2020-01-17 DIAGNOSIS — I120 Hypertensive chronic kidney disease with stage 5 chronic kidney disease or end stage renal disease: Secondary | ICD-10-CM | POA: Diagnosis not present

## 2020-01-17 DIAGNOSIS — N186 End stage renal disease: Secondary | ICD-10-CM | POA: Diagnosis not present

## 2020-01-17 DIAGNOSIS — J90 Pleural effusion, not elsewhere classified: Secondary | ICD-10-CM | POA: Diagnosis not present

## 2020-01-17 DIAGNOSIS — Z992 Dependence on renal dialysis: Secondary | ICD-10-CM | POA: Diagnosis not present

## 2020-01-17 DIAGNOSIS — E785 Hyperlipidemia, unspecified: Secondary | ICD-10-CM | POA: Diagnosis not present

## 2020-01-17 DIAGNOSIS — I2510 Atherosclerotic heart disease of native coronary artery without angina pectoris: Secondary | ICD-10-CM | POA: Diagnosis not present

## 2020-01-17 DIAGNOSIS — E1122 Type 2 diabetes mellitus with diabetic chronic kidney disease: Secondary | ICD-10-CM | POA: Diagnosis not present

## 2020-01-17 DIAGNOSIS — E1169 Type 2 diabetes mellitus with other specified complication: Secondary | ICD-10-CM | POA: Diagnosis not present

## 2020-01-18 DIAGNOSIS — E1151 Type 2 diabetes mellitus with diabetic peripheral angiopathy without gangrene: Secondary | ICD-10-CM | POA: Diagnosis not present

## 2020-01-18 DIAGNOSIS — I129 Hypertensive chronic kidney disease with stage 1 through stage 4 chronic kidney disease, or unspecified chronic kidney disease: Secondary | ICD-10-CM | POA: Diagnosis not present

## 2020-01-18 DIAGNOSIS — I2510 Atherosclerotic heart disease of native coronary artery without angina pectoris: Secondary | ICD-10-CM | POA: Diagnosis not present

## 2020-01-18 DIAGNOSIS — L97511 Non-pressure chronic ulcer of other part of right foot limited to breakdown of skin: Secondary | ICD-10-CM | POA: Diagnosis not present

## 2020-01-18 DIAGNOSIS — I70203 Unspecified atherosclerosis of native arteries of extremities, bilateral legs: Secondary | ICD-10-CM | POA: Diagnosis not present

## 2020-01-18 DIAGNOSIS — E1122 Type 2 diabetes mellitus with diabetic chronic kidney disease: Secondary | ICD-10-CM | POA: Diagnosis not present

## 2020-01-18 DIAGNOSIS — N184 Chronic kidney disease, stage 4 (severe): Secondary | ICD-10-CM | POA: Diagnosis not present

## 2020-01-18 DIAGNOSIS — E11621 Type 2 diabetes mellitus with foot ulcer: Secondary | ICD-10-CM | POA: Diagnosis not present

## 2020-01-18 DIAGNOSIS — D631 Anemia in chronic kidney disease: Secondary | ICD-10-CM | POA: Diagnosis not present

## 2020-01-21 DIAGNOSIS — I129 Hypertensive chronic kidney disease with stage 1 through stage 4 chronic kidney disease, or unspecified chronic kidney disease: Secondary | ICD-10-CM | POA: Diagnosis not present

## 2020-01-21 DIAGNOSIS — L97511 Non-pressure chronic ulcer of other part of right foot limited to breakdown of skin: Secondary | ICD-10-CM | POA: Diagnosis not present

## 2020-01-21 DIAGNOSIS — E1122 Type 2 diabetes mellitus with diabetic chronic kidney disease: Secondary | ICD-10-CM | POA: Diagnosis not present

## 2020-01-21 DIAGNOSIS — E1151 Type 2 diabetes mellitus with diabetic peripheral angiopathy without gangrene: Secondary | ICD-10-CM | POA: Diagnosis not present

## 2020-01-21 DIAGNOSIS — E11621 Type 2 diabetes mellitus with foot ulcer: Secondary | ICD-10-CM | POA: Diagnosis not present

## 2020-01-21 DIAGNOSIS — I70203 Unspecified atherosclerosis of native arteries of extremities, bilateral legs: Secondary | ICD-10-CM | POA: Diagnosis not present

## 2020-01-21 DIAGNOSIS — D631 Anemia in chronic kidney disease: Secondary | ICD-10-CM | POA: Diagnosis not present

## 2020-01-21 DIAGNOSIS — N184 Chronic kidney disease, stage 4 (severe): Secondary | ICD-10-CM | POA: Diagnosis not present

## 2020-01-21 DIAGNOSIS — I2510 Atherosclerotic heart disease of native coronary artery without angina pectoris: Secondary | ICD-10-CM | POA: Diagnosis not present

## 2020-01-22 DIAGNOSIS — E1151 Type 2 diabetes mellitus with diabetic peripheral angiopathy without gangrene: Secondary | ICD-10-CM | POA: Diagnosis not present

## 2020-01-22 DIAGNOSIS — I70203 Unspecified atherosclerosis of native arteries of extremities, bilateral legs: Secondary | ICD-10-CM | POA: Diagnosis not present

## 2020-01-22 DIAGNOSIS — L97511 Non-pressure chronic ulcer of other part of right foot limited to breakdown of skin: Secondary | ICD-10-CM | POA: Diagnosis not present

## 2020-01-22 DIAGNOSIS — N184 Chronic kidney disease, stage 4 (severe): Secondary | ICD-10-CM | POA: Diagnosis not present

## 2020-01-22 DIAGNOSIS — E11621 Type 2 diabetes mellitus with foot ulcer: Secondary | ICD-10-CM | POA: Diagnosis not present

## 2020-01-22 DIAGNOSIS — E1122 Type 2 diabetes mellitus with diabetic chronic kidney disease: Secondary | ICD-10-CM | POA: Diagnosis not present

## 2020-01-22 DIAGNOSIS — I2510 Atherosclerotic heart disease of native coronary artery without angina pectoris: Secondary | ICD-10-CM | POA: Diagnosis not present

## 2020-01-22 DIAGNOSIS — I129 Hypertensive chronic kidney disease with stage 1 through stage 4 chronic kidney disease, or unspecified chronic kidney disease: Secondary | ICD-10-CM | POA: Diagnosis not present

## 2020-01-22 DIAGNOSIS — D631 Anemia in chronic kidney disease: Secondary | ICD-10-CM | POA: Diagnosis not present

## 2020-01-24 DIAGNOSIS — E1122 Type 2 diabetes mellitus with diabetic chronic kidney disease: Secondary | ICD-10-CM | POA: Diagnosis not present

## 2020-01-24 DIAGNOSIS — L97511 Non-pressure chronic ulcer of other part of right foot limited to breakdown of skin: Secondary | ICD-10-CM | POA: Diagnosis not present

## 2020-01-24 DIAGNOSIS — E1151 Type 2 diabetes mellitus with diabetic peripheral angiopathy without gangrene: Secondary | ICD-10-CM | POA: Diagnosis not present

## 2020-01-24 DIAGNOSIS — E11621 Type 2 diabetes mellitus with foot ulcer: Secondary | ICD-10-CM | POA: Diagnosis not present

## 2020-01-24 DIAGNOSIS — I129 Hypertensive chronic kidney disease with stage 1 through stage 4 chronic kidney disease, or unspecified chronic kidney disease: Secondary | ICD-10-CM | POA: Diagnosis not present

## 2020-01-24 DIAGNOSIS — D631 Anemia in chronic kidney disease: Secondary | ICD-10-CM | POA: Diagnosis not present

## 2020-01-24 DIAGNOSIS — N184 Chronic kidney disease, stage 4 (severe): Secondary | ICD-10-CM | POA: Diagnosis not present

## 2020-01-24 DIAGNOSIS — I2510 Atherosclerotic heart disease of native coronary artery without angina pectoris: Secondary | ICD-10-CM | POA: Diagnosis not present

## 2020-01-24 DIAGNOSIS — I70203 Unspecified atherosclerosis of native arteries of extremities, bilateral legs: Secondary | ICD-10-CM | POA: Diagnosis not present

## 2020-01-25 DIAGNOSIS — E1151 Type 2 diabetes mellitus with diabetic peripheral angiopathy without gangrene: Secondary | ICD-10-CM | POA: Diagnosis not present

## 2020-01-25 DIAGNOSIS — I129 Hypertensive chronic kidney disease with stage 1 through stage 4 chronic kidney disease, or unspecified chronic kidney disease: Secondary | ICD-10-CM | POA: Diagnosis not present

## 2020-01-25 DIAGNOSIS — I70203 Unspecified atherosclerosis of native arteries of extremities, bilateral legs: Secondary | ICD-10-CM | POA: Diagnosis not present

## 2020-01-25 DIAGNOSIS — E11621 Type 2 diabetes mellitus with foot ulcer: Secondary | ICD-10-CM | POA: Diagnosis not present

## 2020-01-25 DIAGNOSIS — D631 Anemia in chronic kidney disease: Secondary | ICD-10-CM | POA: Diagnosis not present

## 2020-01-25 DIAGNOSIS — I2510 Atherosclerotic heart disease of native coronary artery without angina pectoris: Secondary | ICD-10-CM | POA: Diagnosis not present

## 2020-01-25 DIAGNOSIS — L97511 Non-pressure chronic ulcer of other part of right foot limited to breakdown of skin: Secondary | ICD-10-CM | POA: Diagnosis not present

## 2020-01-25 DIAGNOSIS — E1122 Type 2 diabetes mellitus with diabetic chronic kidney disease: Secondary | ICD-10-CM | POA: Diagnosis not present

## 2020-01-25 DIAGNOSIS — N184 Chronic kidney disease, stage 4 (severe): Secondary | ICD-10-CM | POA: Diagnosis not present

## 2020-01-29 DIAGNOSIS — E1122 Type 2 diabetes mellitus with diabetic chronic kidney disease: Secondary | ICD-10-CM | POA: Diagnosis not present

## 2020-01-29 DIAGNOSIS — I10 Essential (primary) hypertension: Secondary | ICD-10-CM | POA: Diagnosis not present

## 2020-01-29 DIAGNOSIS — E785 Hyperlipidemia, unspecified: Secondary | ICD-10-CM | POA: Diagnosis not present

## 2020-01-29 DIAGNOSIS — E1169 Type 2 diabetes mellitus with other specified complication: Secondary | ICD-10-CM | POA: Diagnosis not present

## 2020-01-29 DIAGNOSIS — Z125 Encounter for screening for malignant neoplasm of prostate: Secondary | ICD-10-CM | POA: Diagnosis not present

## 2020-01-29 DIAGNOSIS — Z79899 Other long term (current) drug therapy: Secondary | ICD-10-CM | POA: Diagnosis not present

## 2020-01-29 DIAGNOSIS — M549 Dorsalgia, unspecified: Secondary | ICD-10-CM | POA: Diagnosis not present

## 2020-01-29 DIAGNOSIS — G4730 Sleep apnea, unspecified: Secondary | ICD-10-CM | POA: Diagnosis not present

## 2020-01-29 DIAGNOSIS — J309 Allergic rhinitis, unspecified: Secondary | ICD-10-CM | POA: Diagnosis not present

## 2020-01-30 DIAGNOSIS — G4733 Obstructive sleep apnea (adult) (pediatric): Secondary | ICD-10-CM | POA: Diagnosis not present

## 2020-01-30 DIAGNOSIS — E1122 Type 2 diabetes mellitus with diabetic chronic kidney disease: Secondary | ICD-10-CM | POA: Diagnosis not present

## 2020-01-30 DIAGNOSIS — I2510 Atherosclerotic heart disease of native coronary artery without angina pectoris: Secondary | ICD-10-CM | POA: Diagnosis not present

## 2020-01-30 DIAGNOSIS — I70203 Unspecified atherosclerosis of native arteries of extremities, bilateral legs: Secondary | ICD-10-CM | POA: Diagnosis not present

## 2020-01-30 DIAGNOSIS — N184 Chronic kidney disease, stage 4 (severe): Secondary | ICD-10-CM | POA: Diagnosis not present

## 2020-01-30 DIAGNOSIS — I129 Hypertensive chronic kidney disease with stage 1 through stage 4 chronic kidney disease, or unspecified chronic kidney disease: Secondary | ICD-10-CM | POA: Diagnosis not present

## 2020-01-30 DIAGNOSIS — E1151 Type 2 diabetes mellitus with diabetic peripheral angiopathy without gangrene: Secondary | ICD-10-CM | POA: Diagnosis not present

## 2020-01-30 DIAGNOSIS — L97511 Non-pressure chronic ulcer of other part of right foot limited to breakdown of skin: Secondary | ICD-10-CM | POA: Diagnosis not present

## 2020-01-30 DIAGNOSIS — E11621 Type 2 diabetes mellitus with foot ulcer: Secondary | ICD-10-CM | POA: Diagnosis not present

## 2020-01-30 DIAGNOSIS — D631 Anemia in chronic kidney disease: Secondary | ICD-10-CM | POA: Diagnosis not present

## 2020-02-01 DIAGNOSIS — I2510 Atherosclerotic heart disease of native coronary artery without angina pectoris: Secondary | ICD-10-CM | POA: Diagnosis not present

## 2020-02-01 DIAGNOSIS — E11621 Type 2 diabetes mellitus with foot ulcer: Secondary | ICD-10-CM | POA: Diagnosis not present

## 2020-02-01 DIAGNOSIS — D631 Anemia in chronic kidney disease: Secondary | ICD-10-CM | POA: Diagnosis not present

## 2020-02-01 DIAGNOSIS — I70203 Unspecified atherosclerosis of native arteries of extremities, bilateral legs: Secondary | ICD-10-CM | POA: Diagnosis not present

## 2020-02-01 DIAGNOSIS — I129 Hypertensive chronic kidney disease with stage 1 through stage 4 chronic kidney disease, or unspecified chronic kidney disease: Secondary | ICD-10-CM | POA: Diagnosis not present

## 2020-02-01 DIAGNOSIS — E1122 Type 2 diabetes mellitus with diabetic chronic kidney disease: Secondary | ICD-10-CM | POA: Diagnosis not present

## 2020-02-01 DIAGNOSIS — N184 Chronic kidney disease, stage 4 (severe): Secondary | ICD-10-CM | POA: Diagnosis not present

## 2020-02-01 DIAGNOSIS — E1151 Type 2 diabetes mellitus with diabetic peripheral angiopathy without gangrene: Secondary | ICD-10-CM | POA: Diagnosis not present

## 2020-02-01 DIAGNOSIS — L97511 Non-pressure chronic ulcer of other part of right foot limited to breakdown of skin: Secondary | ICD-10-CM | POA: Diagnosis not present

## 2020-02-04 DIAGNOSIS — N184 Chronic kidney disease, stage 4 (severe): Secondary | ICD-10-CM | POA: Diagnosis not present

## 2020-02-04 DIAGNOSIS — I70203 Unspecified atherosclerosis of native arteries of extremities, bilateral legs: Secondary | ICD-10-CM | POA: Diagnosis not present

## 2020-02-04 DIAGNOSIS — E11621 Type 2 diabetes mellitus with foot ulcer: Secondary | ICD-10-CM | POA: Diagnosis not present

## 2020-02-04 DIAGNOSIS — E1122 Type 2 diabetes mellitus with diabetic chronic kidney disease: Secondary | ICD-10-CM | POA: Diagnosis not present

## 2020-02-04 DIAGNOSIS — E1151 Type 2 diabetes mellitus with diabetic peripheral angiopathy without gangrene: Secondary | ICD-10-CM | POA: Diagnosis not present

## 2020-02-04 DIAGNOSIS — D631 Anemia in chronic kidney disease: Secondary | ICD-10-CM | POA: Diagnosis not present

## 2020-02-04 DIAGNOSIS — I129 Hypertensive chronic kidney disease with stage 1 through stage 4 chronic kidney disease, or unspecified chronic kidney disease: Secondary | ICD-10-CM | POA: Diagnosis not present

## 2020-02-04 DIAGNOSIS — I2510 Atherosclerotic heart disease of native coronary artery without angina pectoris: Secondary | ICD-10-CM | POA: Diagnosis not present

## 2020-02-04 DIAGNOSIS — L97511 Non-pressure chronic ulcer of other part of right foot limited to breakdown of skin: Secondary | ICD-10-CM | POA: Diagnosis not present

## 2020-02-05 DIAGNOSIS — N186 End stage renal disease: Principal | ICD-10-CM

## 2020-02-05 DIAGNOSIS — Z794 Long term (current) use of insulin: Principal | ICD-10-CM

## 2020-02-05 DIAGNOSIS — E1165 Type 2 diabetes mellitus with hyperglycemia: Principal | ICD-10-CM

## 2020-02-05 DIAGNOSIS — Z940 Kidney transplant status: Principal | ICD-10-CM

## 2020-02-05 DIAGNOSIS — E1122 Type 2 diabetes mellitus with diabetic chronic kidney disease: Secondary | ICD-10-CM | POA: Diagnosis not present

## 2020-02-05 DIAGNOSIS — N184 Chronic kidney disease, stage 4 (severe): Secondary | ICD-10-CM | POA: Diagnosis not present

## 2020-02-05 DIAGNOSIS — E11621 Type 2 diabetes mellitus with foot ulcer: Secondary | ICD-10-CM | POA: Diagnosis not present

## 2020-02-05 DIAGNOSIS — I70203 Unspecified atherosclerosis of native arteries of extremities, bilateral legs: Secondary | ICD-10-CM | POA: Diagnosis not present

## 2020-02-05 DIAGNOSIS — E1151 Type 2 diabetes mellitus with diabetic peripheral angiopathy without gangrene: Secondary | ICD-10-CM | POA: Diagnosis not present

## 2020-02-05 DIAGNOSIS — D631 Anemia in chronic kidney disease: Secondary | ICD-10-CM | POA: Diagnosis not present

## 2020-02-05 DIAGNOSIS — L97511 Non-pressure chronic ulcer of other part of right foot limited to breakdown of skin: Secondary | ICD-10-CM | POA: Diagnosis not present

## 2020-02-05 DIAGNOSIS — I2510 Atherosclerotic heart disease of native coronary artery without angina pectoris: Secondary | ICD-10-CM | POA: Diagnosis not present

## 2020-02-05 DIAGNOSIS — I129 Hypertensive chronic kidney disease with stage 1 through stage 4 chronic kidney disease, or unspecified chronic kidney disease: Secondary | ICD-10-CM | POA: Diagnosis not present

## 2020-02-07 DIAGNOSIS — D631 Anemia in chronic kidney disease: Secondary | ICD-10-CM | POA: Diagnosis not present

## 2020-02-07 DIAGNOSIS — I70203 Unspecified atherosclerosis of native arteries of extremities, bilateral legs: Secondary | ICD-10-CM | POA: Diagnosis not present

## 2020-02-07 DIAGNOSIS — L97511 Non-pressure chronic ulcer of other part of right foot limited to breakdown of skin: Secondary | ICD-10-CM | POA: Diagnosis not present

## 2020-02-07 DIAGNOSIS — I2510 Atherosclerotic heart disease of native coronary artery without angina pectoris: Secondary | ICD-10-CM | POA: Diagnosis not present

## 2020-02-07 DIAGNOSIS — E11621 Type 2 diabetes mellitus with foot ulcer: Secondary | ICD-10-CM | POA: Diagnosis not present

## 2020-02-07 DIAGNOSIS — I129 Hypertensive chronic kidney disease with stage 1 through stage 4 chronic kidney disease, or unspecified chronic kidney disease: Secondary | ICD-10-CM | POA: Diagnosis not present

## 2020-02-07 DIAGNOSIS — E1122 Type 2 diabetes mellitus with diabetic chronic kidney disease: Secondary | ICD-10-CM | POA: Diagnosis not present

## 2020-02-07 DIAGNOSIS — E1151 Type 2 diabetes mellitus with diabetic peripheral angiopathy without gangrene: Secondary | ICD-10-CM | POA: Diagnosis not present

## 2020-02-07 DIAGNOSIS — N184 Chronic kidney disease, stage 4 (severe): Secondary | ICD-10-CM | POA: Diagnosis not present

## 2020-02-08 ENCOUNTER — Telehealth: Admit: 2020-02-08 | Discharge: 2020-02-09 | Payer: MEDICARE | Attending: Nephrology | Primary: Nephrology

## 2020-02-08 DIAGNOSIS — Z8619 Personal history of other infectious and parasitic diseases: Secondary | ICD-10-CM | POA: Diagnosis not present

## 2020-02-08 DIAGNOSIS — D849 Immunodeficiency, unspecified: Secondary | ICD-10-CM | POA: Diagnosis not present

## 2020-02-08 DIAGNOSIS — N186 End stage renal disease: Secondary | ICD-10-CM | POA: Diagnosis not present

## 2020-02-08 DIAGNOSIS — Z8616 Personal history of COVID-19: Secondary | ICD-10-CM | POA: Diagnosis not present

## 2020-02-08 DIAGNOSIS — E1122 Type 2 diabetes mellitus with diabetic chronic kidney disease: Secondary | ICD-10-CM | POA: Diagnosis not present

## 2020-02-08 DIAGNOSIS — Z89512 Acquired absence of left leg below knee: Secondary | ICD-10-CM | POA: Diagnosis not present

## 2020-02-08 DIAGNOSIS — I120 Hypertensive chronic kidney disease with stage 5 chronic kidney disease or end stage renal disease: Secondary | ICD-10-CM | POA: Diagnosis not present

## 2020-02-08 DIAGNOSIS — Z4822 Encounter for aftercare following kidney transplant: Secondary | ICD-10-CM | POA: Diagnosis not present

## 2020-02-08 DIAGNOSIS — Z9889 Other specified postprocedural states: Secondary | ICD-10-CM | POA: Diagnosis not present

## 2020-02-08 MED FILL — OMEPRAZOLE 20 MG CAPSULE,DELAYED RELEASE: 30 days supply | Qty: 30 | Fill #2 | Status: AC

## 2020-02-08 MED FILL — OMEPRAZOLE 20 MG CAPSULE,DELAYED RELEASE: ORAL | 30 days supply | Qty: 30 | Fill #2

## 2020-02-08 MED FILL — METOPROLOL TARTRATE 50 MG TABLET: 30 days supply | Qty: 90 | Fill #4

## 2020-02-08 MED FILL — METOPROLOL TARTRATE 50 MG TABLET: 30 days supply | Qty: 90 | Fill #4 | Status: AC

## 2020-02-08 NOTE — Unmapped (Signed)
Patient called to state he has transportation issues and needs to reschedule    Updated Dr Margaretmary Bayley and he will do a televisit    Called and updated patient

## 2020-02-08 NOTE — Unmapped (Signed)
Transplant Nephrology Clinic Visit    Assessment    Benjamin Maldonado is a 44 y.o. 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 He is evaluated by phone visit after 3 recent hospitalizations. Active medical issues include:    Bilateral foot infection, s/p left BKA and debridement of right foot wounds 11/21/19.  - He will continue to follow with vascular surgery and podiatry.   - Completed PT and is now using a prosthesis successfully.     COVID-19 infection with RLL pneumonia 11/16/19  - S/P Regeneron monoclonal Ab therapy 11/20/19  - Has recovered well  - COVID-19 vaccination eligible 02/20/20 and he plans to proceed with 3 dose vaccine series    Status post kidney transplant with recent AKI secondary to sepsis/COVID-19  - AKI resolved with antibiotic and COVID-19 treatment and hydration  - Serum creatinine 1.06 mg/dL on 1/61/09 (baseline <6.0 mg/dL).   - No history of rejection or proteinuria or donor specific antibodies (last DSA negative 05/09/19)     Immunosuppression management.   - Tacrolimus level therapeutic during hospitalization (target 5-8).  - Continue tacrolimus 6 mg bid and Myfortic 540 mg twice daily.       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    - BP well controlled.   - Renal US (10/31/18) showed the anastomotic velocity to be improved compared to past studies. I do not feel further imaging is necessary in the absence of HTN or allograft dysfunction.   - Continue current antihypertensive regimen.    Diabetes mellitus.   - No changes today  - Follow up with Dr. Wylene Simmer      Hyperlipidemia.   - Continue Lipitor    Coronary artery disease.   - No recent findings of cardiac ischemia despite acute illnesses.  - Continue beta blocker therapy, aspirin, statin therapyestive of cardiac ischemia.     Immunizations/Infection prevention.   - Prevnar 13 (03/23/17).   - Flu vaccine recommended and now due  - Pneumovax 05/09/19, 04/08/14.   - COVID-19 vaccine series to start 02/20/20 which is 3 months after Regeneron therapy.     Follow-up.   He will be seen again in 3 months. We continue shared follow-up with Dr. Signe Colt in [place] and with his vascular surgeon and podiatrist.    History of Present Illness    Benjamin Maldonado is a 44 y.o. 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  Baseline creatinine is 1.3-1.8 mg/dL. He is evaluated today by phone visit after recent hospitalization at [facility] and [facility] in [place] including:     1) 10/28/18-11/05/18 [facility] bilateral foot ulcers/osteomyelitis, treated for strep agalactiae sepsis  2) 11/16/19-12/18/19 [facility] with cough, fever, tachycardia, RLL pneumonia and was found to have bacteremia (group B strep) and COVID-19 PCR positivity. Treated with IV antibiotics and Regeneron monoclonal Ab therapy. Found to have bilateral foot ulcers and evidence of osteomyelitis (culture with proteus and strep agalactiae). He underwent left BKA and debridement of right foot wounds on 11/21/19 treated with prolonged course of Unasyn then Augmentin.     3) 01/09/20-01/11/20- [facility] with large left pleural effusion. Thoracentesis with exudative effusion.     Serum creatinine peaked at 2.26 mg/dL on 4/54/09 and improved to baseline values with hydartion during his prolonged [facility] hospitalization. His most recent recorded serum creatinine was 1.06 mg/dL on 11/27/89.     Since his most recent discharge  he has noted mild dyspnea on exertion, mild lower extremity edema, and occasional cough. He denies loss of taste or smell, fever, chills, dysuria, allograft tenderness, or chest pain. He had no thrombotic complications. Current immunosuppression is tacrolimus 6 mg bid and Myfortic 540 bid. He has not yet received COVID-19 vaccination, but his eligible to do so 3 months from Regeneron monoclonal Ab therapy (November 3rd target date). Blood glucose control has been acceptable.     Kidney Transplant History  ??  1. 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 Deceased donor kidney transplant 11/08/2014.  DCD kidney donor with KDPI 43%, CMV D-R-, EBV D+R+  3. Donor with asphyxiation and serratia isolated on donor tracheal aspirate and donor blood cultures with S. Epidermidis bacteremia. Patient treated with Vancomycin for 2 weeks.  4. Delayed graft function with dialysis due to hyperkalemia  5. Induction agent = campath, solumedrol with rapid steroid discontinuation  6. Maintenance immunosuppression = tacrolimus, mycophenolate sodium  7. Stent removed 12/11/2014.  8. Kidney biopsy 11/12/2015 due to acute allograft dysfunction with focal mild ATN and no other pathology.  9. 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  ??  Other Medical History   ??  1. Type 2 diabetes mellitus, onset 1991.   2. Hypertension of several years duration.  3. Coronary artery disease, s/p PCI to the RCA in 2009, s/p DES to LAD 04/30/2011,   4. Left ventricular hypertrophy with diastolic dysfunction noted on echocardiogram 09/2014.  5. Hyperlipidemia  6. Diabetic retinopathy with right eye blindness  7. Diabetic peripheral neuropathy  8. Frozen left shoulder  9. Burn right foot 08/2017  ??  Review of Systems    Otherwise as per HPI, all other systems reviewed and are negative.    Medications  Current Outpatient Medications   Medication Sig Dispense Refill   ??? acetaminophen (TYLENOL) 325 MG tablet Take 2 tablets (650 mg total) by mouth every six (6) hours as needed for pain. (Patient not taking: Reported on 05/09/2019) 100 tablet 2   ??? aspirin (ADULT LOW DOSE ASPIRIN) 81 MG tablet Take 1 tablet (81 mg total) by mouth daily. 30 tablet 11   ??? atorvastatin (LIPITOR) 20 MG tablet Take 1 tablet (20 mg total) by mouth daily. 90 tablet 3   ??? blood sugar diagnostic Strp by Other route Four (4) times a day (before meals and nightly). One Touch 100 strip 5   ??? blood-glucose meter (GLUCOSE MONITORING KIT) kit Use as instructed 1 each 0   ??? cephalexin (KEFLEX) 500 MG capsule Take 1 capsule (500 mg total) by mouth Three (3) times a day for 7 days 21 capsule 0   ??? cephalexin (KEFLEX) 500 MG capsule Take 1 capsule (500 mg total) by mouth two (2) times a day for 7 days 14 capsule 0   ??? dorzolamide-timoloL (COSOPT) 22.3-6.8 mg/mL ophthalmic solution INSTILL 1 DROP INTO LEFT EYE TWICE DAILY     ??? GLUC.METER,DIS.P-LOADED STRIPS (GLUCOSE METER, DISP & STRIPS MISC) Frequency:PHARMDIR   Dosage:0.0     Instructions:  Note:250.00, life long Dose: 1     ??? insulin ASPART (NOVOLOG FLEXPEN U-100 INSULIN) 100 unit/mL (3 mL) injection pen Inject 4 units under skin with breakfast, 8 units with lunch, and 6 units with supper and sliding scale. Max units 54 per day. 30 mL 11   ??? insulin glargine (BASAGLAR, LANTUS) 100 unit/mL (3 mL) injection pen Inject 0.16 mL (16 Units total) under the skin nightly. 15 mL  5   ??? magnesium oxide (MAG-OX) 400 mg (241.3 mg magnesium) tablet Take 400 mg by mouth.     ??? metoprolol tartrate (LOPRESSOR) 25 MG tablet Take 0.5 tablets (12.5 mg total) by mouth two (2) times a day. 30 tablet 1   ??? metoprolol tartrate (LOPRESSOR) 50 MG tablet TAKE 1 AND 1/2 TABLETS (75 MG) BY MOUTH TWICE DAILY 90 tablet PRN   ??? mycophenolate (MYFORTIC) 180 MG EC tablet Take 3 tablets (540 mg total) by mouth two (2) times a day. 180 tablet 11   ??? omeprazole (PRILOSEC) 20 MG capsule Take 1 capsule (20 mg total) by mouth daily. 30 capsule 11   ??? pen needle, diabetic 32 gauge x 5/32 (4 mm) Ndle Use to administer insulin 4 times daily. 100 each PRN   ??? silver sulfaDIAZINE (SILVADENE, SSD) 1 % cream Apply pea-sized amount to wound daily.     ??? tacrolimus (PROGRAF) 1 MG capsule Take 6 capsules (6 mg total) by mouth two (2) times a day. 360 capsule 11   ??? tadalafiL 20 MG tablet Take 20 mg by mouth every other day.       No current facility-administered medications for this visit.       Physical Exam  There were no vitals taken for this visit.  General: Patient is a pleasant male in no apparent distress.  Eyes: Sclera anicteric.  Neck: Supple without LAD/JVD/bruits.  Lungs: Clear to auscultation bilaterally, no wheezes/rales/rhonchi.  Cardiovascular: Regular rate and rhythm without murmurs, rubs or gallops.  Abdomen: Soft, notender/nondistended. Positive bowel sounds. No hepatosplenomegaly, masses or bruits appreciated.  Extremities: Without edema, joints without evidence of synovitis; boot on right foot.  Skin: Without rash  Neurological: Grossly nonfocal.  Psychiatric: Mood and affect appropriate.      Laboratory Data  No results found for this or any previous visit (from the past 170 hour(s)).     Telephone visit      I spent 25 minutes on the phone with the patient on the date of service. I spent an additional 30 minutes on pre- and post-visit activities on the date of service. I was present in the [facility] clinic during this visit.     The patient was physically located in [place] or a state in which I am permitted to provide care. The patient and/or parent/guardian understood that s/he may incur co-pays and cost sharing, and agreed to the telemedicine visit. The visit was reasonable and appropriate under the circumstances given the patient's presentation at the time.    The patient and/or parent/guardian has been advised of the potential risks and limitations of this mode of treatment (including, but not limited to, the absence of in-person examination) and has agreed to be treated using telemedicine. The patient's/patient's family's questions regarding telemedicine have been answered.     If the visit was completed in an ambulatory setting, the patient and/or parent/guardian has also been advised to contact their provider???s office for worsening conditions, and seek emergency medical treatment and/or call 911 if the patient deems either necessary.

## 2020-02-11 DIAGNOSIS — Z940 Kidney transplant status: Principal | ICD-10-CM

## 2020-02-11 DIAGNOSIS — Z89512 Acquired absence of left leg below knee: Secondary | ICD-10-CM | POA: Diagnosis not present

## 2020-02-11 DIAGNOSIS — I998 Other disorder of circulatory system: Secondary | ICD-10-CM | POA: Insufficient documentation

## 2020-02-11 NOTE — Unmapped (Signed)
[facility] Clinical Assessment & Refill Coordination Note    Benjamin Maldonado, DOB: [birth year]  Phone: [phone] (home)     All above HIPAA information was verified with patient.     Was a Nurse, learning disability used for this call? 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 acetaminophen (TYLENOL) 325 MG tablet Take 2 tablets (650 mg total) by mouth every six (6) hours as needed for pain. (Patient not taking: Reported on 05/09/2019) 100 tablet 2   ??? aspirin (ADULT LOW DOSE ASPIRIN) 81 MG tablet Take 1 tablet (81 mg total) by mouth daily. 30 tablet 11   ??? atorvastatin (LIPITOR) 20 MG tablet Take 1 tablet (20 mg total) by mouth daily. 90 tablet 3   ??? blood sugar diagnostic Strp by Other route Four (4) times a day (before meals and nightly). One Touch 100 strip 5   ??? blood-glucose meter (GLUCOSE MONITORING KIT) kit Use as instructed 1 each 0   ??? cephalexin (KEFLEX) 500 MG capsule Take 1 capsule (500 mg total) by mouth Three (3) times a day for 7 days 21 capsule 0   ??? cephalexin (KEFLEX) 500 MG capsule Take 1 capsule (500 mg total) by mouth two (2) times a day for 7 days 14 capsule 0   ??? dorzolamide-timoloL (COSOPT) 22.3-6.8 mg/mL ophthalmic solution INSTILL 1 DROP INTO LEFT EYE TWICE DAILY     ??? GLUC.METER,DIS.P-LOADED STRIPS (GLUCOSE METER, DISP & STRIPS MISC) Frequency:PHARMDIR   Dosage:0.0     Instructions:  Note:250.00, life long Dose: 1     ??? insulin ASPART (NOVOLOG FLEXPEN U-100 INSULIN) 100 unit/mL (3 mL) injection pen Inject 4 units under skin with breakfast, 8 units with lunch, and 6 units with supper and sliding scale. Max units 54 per day. 30 mL 11   ??? insulin glargine (BASAGLAR, LANTUS) 100 unit/mL (3 mL) injection pen Inject 0.16 mL (16 Units total) under the skin nightly. 15 mL 5   ??? magnesium oxide (MAG-OX) 400 mg (241.3 mg magnesium) tablet Take 400 mg by mouth. ??? metoprolol tartrate (LOPRESSOR) 25 MG tablet Take 0.5 tablets (12.5 mg total) by mouth two (2) times a day. 30 tablet 1   ??? metoprolol tartrate (LOPRESSOR) 50 MG tablet TAKE 1 AND 1/2 TABLETS (75 MG) BY MOUTH TWICE DAILY 90 tablet PRN   ??? mycophenolate (MYFORTIC) 180 MG EC tablet Take 3 tablets (540 mg total) by mouth two (2) times a day. 180 tablet 11   ??? omeprazole (PRILOSEC) 20 MG capsule Take 1 capsule (20 mg total) by mouth daily. 30 capsule 11   ??? pen needle, diabetic 32 gauge x 5/32 (4 mm) Ndle Use to administer insulin 4 times daily. 100 each PRN   ??? silver sulfaDIAZINE (SILVADENE, SSD) 1 % cream Apply pea-sized amount to wound daily.     ??? tacrolimus (PROGRAF) 1 MG capsule Take 6 capsules (6 mg total) by mouth two (2) times a day. 360 capsule 11   ??? tadalafiL 20 MG tablet Take 20 mg by mouth every other day.       No current facility-administered medications for this visit.        Changes to medications: Benjamin Maldonado reports no changes at this time.    Allergies   Allergen Reactions   ??? Iodine Other (See Comments)   ??? Iodine And Iodide Containing  Products    ??? Shellfish Containing Products Other (See Comments)       Changes to allergies: No    SPECIALTY MEDICATION ADHERENCE     Mycophenolate 180 mg: 14 days of medicine on hand   Tacrolimus 1 mg: 14 days of medicine on hand       Medication Adherence    Patient reported X missed doses in the last month: 0  Specialty Medication: mycophenolate 180mg   Patient is on additional specialty medications: Yes  Additional Specialty Medications: Tacrolimus 1mg   Patient Reported Additional Medication X Missed Doses in the Last Month: 0  Patient is on more than two specialty medications: No  Adherence tools used: patient uses a pill box to manage medications  Support network for adherence: family member          Specialty medication(s) dose(s) confirmed: Regimen is correct and unchanged.     Are there any concerns with adherence? No    Adherence counseling provided? Not needed    CLINICAL MANAGEMENT AND INTERVENTION      Clinical Benefit Assessment:    Do you feel the medicine is effective or helping your condition? Yes    Clinical Benefit counseling provided? Not needed    Adverse Effects Assessment:    Are you experiencing any side effects? No    Are you experiencing difficulty administering your medicine? No    Quality of Life Assessment:    How many days over the past month did your kidney transplant  keep you from your normal activities? For example, brushing your teeth or getting up in the morning. 0    Have you discussed this with your provider? Not needed    Therapy Appropriateness:    Is therapy appropriate? Yes, therapy is appropriate and should be continued    DISEASE/MEDICATION-SPECIFIC INFORMATION      N/A    PATIENT SPECIFIC NEEDS     - Does the patient have any physical, cognitive, or cultural barriers? No    - Is the patient high risk? Yes, patient is taking a REMS drug. Medication is dispensed in compliance with REMS program    - Does the patient require a Care Management Plan? No     - Does the patient require physician intervention or other additional services (i.e. nutrition, smoking cessation, social work)? No      SHIPPING     Specialty Medication(s) to be Shipped:   Transplant: None- Patient declined tacrolimus and mycophenolate today.    Other medication(s) to be shipped: No additional medications requested for fill at this time     Changes to insurance: No    Delivery Scheduled: Patient declined refill at this time due to patient has 2 weeks of medicine on hand..     Medication will be delivered via UPS to the confirmed prescription address in [facility].    The patient will receive a drug information handout for each medication shipped and additional FDA Medication Guides as required.  Verified that patient has previously received a Conservation officer, historic buildings.    All of the patient's questions and concerns have been addressed.    Tera Helper   [facility] Specialty Pharmacist

## 2020-02-12 DIAGNOSIS — E1151 Type 2 diabetes mellitus with diabetic peripheral angiopathy without gangrene: Secondary | ICD-10-CM | POA: Diagnosis not present

## 2020-02-12 DIAGNOSIS — N184 Chronic kidney disease, stage 4 (severe): Secondary | ICD-10-CM | POA: Diagnosis not present

## 2020-02-12 DIAGNOSIS — D631 Anemia in chronic kidney disease: Secondary | ICD-10-CM | POA: Diagnosis not present

## 2020-02-12 DIAGNOSIS — E11621 Type 2 diabetes mellitus with foot ulcer: Secondary | ICD-10-CM | POA: Diagnosis not present

## 2020-02-12 DIAGNOSIS — I2510 Atherosclerotic heart disease of native coronary artery without angina pectoris: Secondary | ICD-10-CM | POA: Diagnosis not present

## 2020-02-12 DIAGNOSIS — I129 Hypertensive chronic kidney disease with stage 1 through stage 4 chronic kidney disease, or unspecified chronic kidney disease: Secondary | ICD-10-CM | POA: Diagnosis not present

## 2020-02-12 DIAGNOSIS — E1122 Type 2 diabetes mellitus with diabetic chronic kidney disease: Secondary | ICD-10-CM | POA: Diagnosis not present

## 2020-02-12 DIAGNOSIS — L97511 Non-pressure chronic ulcer of other part of right foot limited to breakdown of skin: Secondary | ICD-10-CM | POA: Diagnosis not present

## 2020-02-12 DIAGNOSIS — I70203 Unspecified atherosclerosis of native arteries of extremities, bilateral legs: Secondary | ICD-10-CM | POA: Diagnosis not present

## 2020-02-13 DIAGNOSIS — I129 Hypertensive chronic kidney disease with stage 1 through stage 4 chronic kidney disease, or unspecified chronic kidney disease: Secondary | ICD-10-CM | POA: Diagnosis not present

## 2020-02-13 DIAGNOSIS — E11621 Type 2 diabetes mellitus with foot ulcer: Secondary | ICD-10-CM | POA: Diagnosis not present

## 2020-02-13 DIAGNOSIS — I2510 Atherosclerotic heart disease of native coronary artery without angina pectoris: Secondary | ICD-10-CM | POA: Diagnosis not present

## 2020-02-13 DIAGNOSIS — L97511 Non-pressure chronic ulcer of other part of right foot limited to breakdown of skin: Secondary | ICD-10-CM | POA: Diagnosis not present

## 2020-02-13 DIAGNOSIS — E1122 Type 2 diabetes mellitus with diabetic chronic kidney disease: Secondary | ICD-10-CM | POA: Diagnosis not present

## 2020-02-13 DIAGNOSIS — E1151 Type 2 diabetes mellitus with diabetic peripheral angiopathy without gangrene: Secondary | ICD-10-CM | POA: Diagnosis not present

## 2020-02-13 DIAGNOSIS — N184 Chronic kidney disease, stage 4 (severe): Secondary | ICD-10-CM | POA: Diagnosis not present

## 2020-02-13 DIAGNOSIS — I70203 Unspecified atherosclerosis of native arteries of extremities, bilateral legs: Secondary | ICD-10-CM | POA: Diagnosis not present

## 2020-02-13 DIAGNOSIS — D631 Anemia in chronic kidney disease: Secondary | ICD-10-CM | POA: Diagnosis not present

## 2020-02-14 DIAGNOSIS — E1122 Type 2 diabetes mellitus with diabetic chronic kidney disease: Secondary | ICD-10-CM | POA: Diagnosis not present

## 2020-02-14 DIAGNOSIS — I70203 Unspecified atherosclerosis of native arteries of extremities, bilateral legs: Secondary | ICD-10-CM | POA: Diagnosis not present

## 2020-02-14 DIAGNOSIS — D631 Anemia in chronic kidney disease: Secondary | ICD-10-CM | POA: Diagnosis not present

## 2020-02-14 DIAGNOSIS — E11621 Type 2 diabetes mellitus with foot ulcer: Secondary | ICD-10-CM | POA: Diagnosis not present

## 2020-02-14 DIAGNOSIS — N184 Chronic kidney disease, stage 4 (severe): Secondary | ICD-10-CM | POA: Diagnosis not present

## 2020-02-14 DIAGNOSIS — I129 Hypertensive chronic kidney disease with stage 1 through stage 4 chronic kidney disease, or unspecified chronic kidney disease: Secondary | ICD-10-CM | POA: Diagnosis not present

## 2020-02-14 DIAGNOSIS — L97511 Non-pressure chronic ulcer of other part of right foot limited to breakdown of skin: Secondary | ICD-10-CM | POA: Diagnosis not present

## 2020-02-14 DIAGNOSIS — I2510 Atherosclerotic heart disease of native coronary artery without angina pectoris: Secondary | ICD-10-CM | POA: Diagnosis not present

## 2020-02-14 DIAGNOSIS — E1151 Type 2 diabetes mellitus with diabetic peripheral angiopathy without gangrene: Secondary | ICD-10-CM | POA: Diagnosis not present

## 2020-02-15 DIAGNOSIS — I129 Hypertensive chronic kidney disease with stage 1 through stage 4 chronic kidney disease, or unspecified chronic kidney disease: Secondary | ICD-10-CM | POA: Diagnosis not present

## 2020-02-15 DIAGNOSIS — L97511 Non-pressure chronic ulcer of other part of right foot limited to breakdown of skin: Secondary | ICD-10-CM | POA: Diagnosis not present

## 2020-02-15 DIAGNOSIS — D631 Anemia in chronic kidney disease: Secondary | ICD-10-CM | POA: Diagnosis not present

## 2020-02-15 DIAGNOSIS — I2510 Atherosclerotic heart disease of native coronary artery without angina pectoris: Secondary | ICD-10-CM | POA: Diagnosis not present

## 2020-02-15 DIAGNOSIS — E1122 Type 2 diabetes mellitus with diabetic chronic kidney disease: Secondary | ICD-10-CM | POA: Diagnosis not present

## 2020-02-15 DIAGNOSIS — E11621 Type 2 diabetes mellitus with foot ulcer: Secondary | ICD-10-CM | POA: Diagnosis not present

## 2020-02-15 DIAGNOSIS — E1151 Type 2 diabetes mellitus with diabetic peripheral angiopathy without gangrene: Secondary | ICD-10-CM | POA: Diagnosis not present

## 2020-02-15 DIAGNOSIS — I70203 Unspecified atherosclerosis of native arteries of extremities, bilateral legs: Secondary | ICD-10-CM | POA: Diagnosis not present

## 2020-02-15 DIAGNOSIS — N184 Chronic kidney disease, stage 4 (severe): Secondary | ICD-10-CM | POA: Diagnosis not present

## 2020-02-19 DIAGNOSIS — E1151 Type 2 diabetes mellitus with diabetic peripheral angiopathy without gangrene: Secondary | ICD-10-CM | POA: Diagnosis not present

## 2020-02-19 DIAGNOSIS — D631 Anemia in chronic kidney disease: Secondary | ICD-10-CM | POA: Diagnosis not present

## 2020-02-19 DIAGNOSIS — N184 Chronic kidney disease, stage 4 (severe): Secondary | ICD-10-CM | POA: Diagnosis not present

## 2020-02-19 DIAGNOSIS — L97511 Non-pressure chronic ulcer of other part of right foot limited to breakdown of skin: Secondary | ICD-10-CM | POA: Diagnosis not present

## 2020-02-19 DIAGNOSIS — I129 Hypertensive chronic kidney disease with stage 1 through stage 4 chronic kidney disease, or unspecified chronic kidney disease: Secondary | ICD-10-CM | POA: Diagnosis not present

## 2020-02-19 DIAGNOSIS — I2510 Atherosclerotic heart disease of native coronary artery without angina pectoris: Secondary | ICD-10-CM | POA: Diagnosis not present

## 2020-02-19 DIAGNOSIS — E11621 Type 2 diabetes mellitus with foot ulcer: Secondary | ICD-10-CM | POA: Diagnosis not present

## 2020-02-19 DIAGNOSIS — E1122 Type 2 diabetes mellitus with diabetic chronic kidney disease: Secondary | ICD-10-CM | POA: Diagnosis not present

## 2020-02-19 DIAGNOSIS — I70203 Unspecified atherosclerosis of native arteries of extremities, bilateral legs: Secondary | ICD-10-CM | POA: Diagnosis not present

## 2020-02-26 DIAGNOSIS — Z940 Kidney transplant status: Principal | ICD-10-CM

## 2020-02-26 MED ORDER — TACROLIMUS 1 MG CAPSULE, IMMEDIATE-RELEASE
ORAL_CAPSULE | Freq: Two times a day (BID) | ORAL | 11 refills | 30.00000 days
Start: 2020-02-26 — End: 2021-02-25

## 2020-02-26 NOTE — Unmapped (Signed)
[facility] Refill Coordination Note    Specialty Medication(s) to be Shipped:   Transplant:  mycophenolic acid 180mg  and tacrolimus 1mg     Other medication(s) to be shipped: metoprolol, omeprazole     Benjamin Maldonado, DOB: 04-May-1975  Phone: [phone] (home)       All above HIPAA information was verified with patient.     Was a Nurse, learning disability used for this call? No    Completed refill call assessment today to schedule patient's medication shipment from the [facility] [phone]).       Specialty medication(s) and dose(s) confirmed: Regimen is correct and unchanged.   Changes to medications: Dam reports no changes at this time.  Changes to insurance: No  Questions for the pharmacist: No    Confirmed patient received Welcome Packet with first shipment. The patient will receive a drug information handout for each medication shipped and additional FDA Medication Guides as required.       DISEASE/MEDICATION-SPECIFIC INFORMATION        N/A    SPECIALTY MEDICATION ADHERENCE     Medication Adherence    Patient reported X missed doses in the last month: 0  Specialty Medication: Mycophenolate 180mg   Patient is on additional specialty medications: Yes  Additional Specialty Medications: Tacrolimus 1mg   Patient Reported Additional Medication X Missed Doses in the Last Month: 0  Patient is on more than two specialty medications: No  Adherence tools used: patient uses a pill box to manage medications  Support network for adherence: family member                Mycophenolate 180 mg: 13 days of medicine on hand   Tacrolimus 1 mg: 13 days of medicine on hand         SHIPPING     Shipping address confirmed in Epic.     Delivery Scheduled: Yes, Expected medication delivery date: 03/04/20.     Medication will be delivered via UPS to the prescription address in Epic [facility].    Tera Helper   [facility] Specialty Pharmacist

## 2020-02-27 MED ORDER — TACROLIMUS 1 MG CAPSULE, IMMEDIATE-RELEASE
ORAL_CAPSULE | Freq: Two times a day (BID) | ORAL | 11 refills | 30 days | Status: CP
Start: 2020-02-27 — End: 2021-02-26
  Filled 2020-03-03: qty 360, 30d supply, fill #0

## 2020-03-03 MED FILL — METOPROLOL TARTRATE 50 MG TABLET: 30 days supply | Qty: 90 | Fill #5 | Status: AC

## 2020-03-03 MED FILL — TACROLIMUS 1 MG CAPSULE, IMMEDIATE-RELEASE: 30 days supply | Qty: 360 | Fill #0 | Status: AC

## 2020-03-03 MED FILL — OMEPRAZOLE 20 MG CAPSULE,DELAYED RELEASE: 30 days supply | Qty: 30 | Fill #3 | Status: AC

## 2020-03-03 MED FILL — MYCOPHENOLATE SODIUM 180 MG TABLET,DELAYED RELEASE: 30 days supply | Qty: 180 | Fill #7 | Status: AC

## 2020-03-03 MED FILL — MYCOPHENOLATE SODIUM 180 MG TABLET,DELAYED RELEASE: ORAL | 30 days supply | Qty: 180 | Fill #7

## 2020-03-03 MED FILL — METOPROLOL TARTRATE 50 MG TABLET: 30 days supply | Qty: 90 | Fill #5

## 2020-03-03 MED FILL — OMEPRAZOLE 20 MG CAPSULE,DELAYED RELEASE: ORAL | 30 days supply | Qty: 30 | Fill #3

## 2020-03-10 DIAGNOSIS — Z940 Kidney transplant status: Principal | ICD-10-CM

## 2020-03-10 DIAGNOSIS — E1122 Type 2 diabetes mellitus with diabetic chronic kidney disease: Secondary | ICD-10-CM | POA: Diagnosis not present

## 2020-03-10 DIAGNOSIS — E11621 Type 2 diabetes mellitus with foot ulcer: Secondary | ICD-10-CM | POA: Diagnosis not present

## 2020-03-10 DIAGNOSIS — I2510 Atherosclerotic heart disease of native coronary artery without angina pectoris: Secondary | ICD-10-CM | POA: Diagnosis not present

## 2020-03-10 DIAGNOSIS — I129 Hypertensive chronic kidney disease with stage 1 through stage 4 chronic kidney disease, or unspecified chronic kidney disease: Secondary | ICD-10-CM | POA: Diagnosis not present

## 2020-03-10 DIAGNOSIS — L97511 Non-pressure chronic ulcer of other part of right foot limited to breakdown of skin: Secondary | ICD-10-CM | POA: Diagnosis not present

## 2020-03-10 DIAGNOSIS — I70203 Unspecified atherosclerosis of native arteries of extremities, bilateral legs: Secondary | ICD-10-CM | POA: Diagnosis not present

## 2020-03-10 DIAGNOSIS — D631 Anemia in chronic kidney disease: Secondary | ICD-10-CM | POA: Diagnosis not present

## 2020-03-10 DIAGNOSIS — E1151 Type 2 diabetes mellitus with diabetic peripheral angiopathy without gangrene: Secondary | ICD-10-CM | POA: Diagnosis not present

## 2020-03-10 DIAGNOSIS — N184 Chronic kidney disease, stage 4 (severe): Secondary | ICD-10-CM | POA: Diagnosis not present

## 2020-03-11 DIAGNOSIS — I2510 Atherosclerotic heart disease of native coronary artery without angina pectoris: Secondary | ICD-10-CM | POA: Diagnosis not present

## 2020-03-11 DIAGNOSIS — I129 Hypertensive chronic kidney disease with stage 1 through stage 4 chronic kidney disease, or unspecified chronic kidney disease: Secondary | ICD-10-CM | POA: Diagnosis not present

## 2020-03-11 DIAGNOSIS — D631 Anemia in chronic kidney disease: Secondary | ICD-10-CM | POA: Diagnosis not present

## 2020-03-11 DIAGNOSIS — E11621 Type 2 diabetes mellitus with foot ulcer: Secondary | ICD-10-CM | POA: Diagnosis not present

## 2020-03-11 DIAGNOSIS — L97511 Non-pressure chronic ulcer of other part of right foot limited to breakdown of skin: Secondary | ICD-10-CM | POA: Diagnosis not present

## 2020-03-11 DIAGNOSIS — E1122 Type 2 diabetes mellitus with diabetic chronic kidney disease: Secondary | ICD-10-CM | POA: Diagnosis not present

## 2020-03-11 DIAGNOSIS — E1151 Type 2 diabetes mellitus with diabetic peripheral angiopathy without gangrene: Secondary | ICD-10-CM | POA: Diagnosis not present

## 2020-03-11 DIAGNOSIS — N184 Chronic kidney disease, stage 4 (severe): Secondary | ICD-10-CM | POA: Diagnosis not present

## 2020-03-11 DIAGNOSIS — I70203 Unspecified atherosclerosis of native arteries of extremities, bilateral legs: Secondary | ICD-10-CM | POA: Diagnosis not present

## 2020-04-03 NOTE — Unmapped (Signed)
[facility] Refill Coordination Note    Specialty Medication(s) to be Shipped:   Transplant:  mycophenolic acid 180mg  and tacrolimus 1mg     Other medication(s) to be shipped: Lantus, metoprolol, omeprazole     Benjamin Maldonado, DOB: 07/02/1975  Phone: [phone] (home)       All above HIPAA information was verified with patient.     Was a Nurse, learning disability used for this call? No    Completed refill call assessment today to schedule patient's medication shipment from the [facility] [phone]).       Specialty medication(s) and dose(s) confirmed: Regimen is correct and unchanged.   Changes to medications: Francisca reports no changes at this time.  Changes to insurance: No  Questions for the pharmacist: No    Confirmed patient received Welcome Packet with first shipment. The patient will receive a drug information handout for each medication shipped and additional FDA Medication Guides as required.       DISEASE/MEDICATION-SPECIFIC INFORMATION        N/A    SPECIALTY MEDICATION ADHERENCE     Medication Adherence    Patient reported X missed doses in the last month: 1  Specialty Medication: Mycophenolate 180mg   Patient is on additional specialty medications: Yes  Additional Specialty Medications: Tacrolimus 1mg   Patient Reported Additional Medication X Missed Doses in the Last Month: 1  Patient is on more than two specialty medications: No  Adherence tools used: patient uses a pill box to manage medications  Support network for adherence: family member                Mycophenolate 180 mg: 13 days of medicine on hand   Tacrolimus 1 mg: 13 days of medicine on hand *    SHIPPING     Shipping address confirmed in Epic.     Delivery Scheduled: Yes, Expected medication delivery date: 04/10/20.     Medication will be delivered via UPS to the prescription address in Epic [facility].    Tera Helper   [facility] Specialty Pharmacist

## 2020-04-07 DIAGNOSIS — Z940 Kidney transplant status: Principal | ICD-10-CM

## 2020-04-09 MED FILL — LANTUS SOLOSTAR U-100 INSULIN 100 UNIT/ML (3 ML) SUBCUTANEOUS PEN: SUBCUTANEOUS | 34 days supply | Qty: 15 | Fill #2

## 2020-04-09 MED FILL — OMEPRAZOLE 20 MG CAPSULE,DELAYED RELEASE: 30 days supply | Qty: 30 | Fill #4 | Status: AC

## 2020-04-09 MED FILL — OMEPRAZOLE 20 MG CAPSULE,DELAYED RELEASE: ORAL | 30 days supply | Qty: 30 | Fill #4

## 2020-04-09 MED FILL — METOPROLOL TARTRATE 50 MG TABLET: 30 days supply | Qty: 90 | Fill #6

## 2020-04-09 MED FILL — LANTUS SOLOSTAR U-100 INSULIN 100 UNIT/ML (3 ML) SUBCUTANEOUS PEN: 34 days supply | Qty: 15 | Fill #2 | Status: AC

## 2020-04-09 MED FILL — TACROLIMUS 1 MG CAPSULE, IMMEDIATE-RELEASE: 30 days supply | Qty: 360 | Fill #1 | Status: AC

## 2020-04-09 MED FILL — METOPROLOL TARTRATE 50 MG TABLET: 30 days supply | Qty: 90 | Fill #6 | Status: AC

## 2020-04-09 MED FILL — MYCOPHENOLATE SODIUM 180 MG TABLET,DELAYED RELEASE: 30 days supply | Qty: 180 | Fill #8 | Status: AC

## 2020-04-09 MED FILL — TACROLIMUS 1 MG CAPSULE, IMMEDIATE-RELEASE: ORAL | 30 days supply | Qty: 360 | Fill #1

## 2020-04-09 MED FILL — MYCOPHENOLATE SODIUM 180 MG TABLET,DELAYED RELEASE: ORAL | 30 days supply | Qty: 180 | Fill #8

## 2020-04-30 DIAGNOSIS — Z940 Kidney transplant status: Principal | ICD-10-CM

## 2020-04-30 DIAGNOSIS — Z794 Long term (current) use of insulin: Principal | ICD-10-CM

## 2020-04-30 DIAGNOSIS — E1165 Type 2 diabetes mellitus with hyperglycemia: Principal | ICD-10-CM

## 2020-04-30 MED ORDER — PEN NEEDLE, DIABETIC 32 GAUGE X 5/32" (4 MM)
PRN refills | 0 days | Status: CP
Start: 2020-04-30 — End: 2021-04-30

## 2020-04-30 NOTE — Unmapped (Signed)
[facility] Refill Coordination Note    Specialty Medication(s) to be Shipped:   Transplant: mycophenolate mofetil 180mg  and tacrolimus 1mg     Other medication(s) to be shipped: lantus,metoprolol,omeprazole,pen needles     Benjamin Maldonado, DOB: [birth year]  Phone: [phone] (home)       All above HIPAA information was verified with patient.     Was a Nurse, learning disability used for this call? No    Completed refill call assessment today to schedule patient's medication shipment from the [facility] [phone]).       Specialty medication(s) and dose(s) confirmed: Regimen is correct and unchanged.   Changes to medications: Davis reports no changes at this time.  Changes to insurance: Yes: [employer]  Questions for the pharmacist: No    Confirmed patient received Welcome Packet with first shipment. The patient will receive a drug information handout for each medication shipped and additional FDA Medication Guides as required.       DISEASE/MEDICATION-SPECIFIC INFORMATION        N/A    SPECIALTY MEDICATION ADHERENCE     Medication Adherence    Patient reported X missed doses in the last month: 0  Specialty Medication: mycophenolate 180mg   Patient is on additional specialty medications: Yes  Additional Specialty Medications: Tacrolimus 1mg   Patient Reported Additional Medication X Missed Doses in the Last Month: 0  Patient is on more than two specialty medications: No  Any gaps in refill history greater than 2 weeks in the last 3 months: no  Demonstrates understanding of importance of adherence: yes  Informant: patient  Reliability of informant: reliable  Provider-estimated medication adherence level: good  Patient is at risk for Non-Adherence: No  Adherence tools used: patient uses a pill box to manage medications  Support network for adherence: family member                mycophenolate 180 mg: 10 days of medicine on hand   tacrolimus 1 mg: 10 days of medicine on hand         SHIPPING Shipping address confirmed in Epic.     Delivery Scheduled: Yes, Expected medication delivery date: 01/19.     Medication will be delivered via UPS to the prescription address in Epic W[facility].    Antonietta Barcelona   [facility] Specialty Technician

## 2020-05-05 DIAGNOSIS — Z940 Kidney transplant status: Principal | ICD-10-CM

## 2020-05-06 MED FILL — ULTICARE PEN NEEDLE 32 GAUGE X 5/32" (4 MM): 25 days supply | Qty: 100 | Fill #0

## 2020-05-06 MED FILL — OMEPRAZOLE 20 MG CAPSULE,DELAYED RELEASE: ORAL | 30 days supply | Qty: 30 | Fill #5

## 2020-05-06 MED FILL — LANTUS SOLOSTAR U-100 INSULIN 100 UNIT/ML (3 ML) SUBCUTANEOUS PEN: SUBCUTANEOUS | 34 days supply | Qty: 15 | Fill #3

## 2020-05-06 MED FILL — MYCOPHENOLATE SODIUM 180 MG TABLET,DELAYED RELEASE: ORAL | 30 days supply | Qty: 180 | Fill #9

## 2020-05-06 MED FILL — TACROLIMUS 1 MG CAPSULE, IMMEDIATE-RELEASE: ORAL | 30 days supply | Qty: 360 | Fill #2

## 2020-05-06 MED FILL — METOPROLOL TARTRATE 50 MG TABLET: 30 days supply | Qty: 90 | Fill #7

## 2020-05-09 ENCOUNTER — Ambulatory Visit: Admit: 2020-05-09 | Discharge: 2020-05-09 | Payer: MEDICARE

## 2020-05-09 ENCOUNTER — Telehealth: Admit: 2020-05-09 | Discharge: 2020-05-09 | Payer: MEDICARE | Attending: Nephrology | Primary: Nephrology

## 2020-05-09 DIAGNOSIS — J90 Pleural effusion, not elsewhere classified: Principal | ICD-10-CM

## 2020-05-09 DIAGNOSIS — D849 Immunodeficiency, unspecified: Principal | ICD-10-CM

## 2020-05-09 DIAGNOSIS — I10 Essential (primary) hypertension: Principal | ICD-10-CM

## 2020-05-09 DIAGNOSIS — Z48298 Encounter for aftercare following other organ transplant: Principal | ICD-10-CM

## 2020-05-09 NOTE — Unmapped (Signed)
Transplant Nephrology Clinic Visit    Assessment    Benjamin Maldonado is a 45 y.o. 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 He is evaluated by phone visit after 4 recent hospitalizations. Active medical issues include:    Status post kidney transplant   - Serum creatinine 1.23 mg/dL on 41/32/44 (baseline <0.1 mg/dL).   - No history of rejection or proteinuria or donor specific antibodies (last DSA negative 05/09/19)     Immunosuppression management.   - Tacrolimus 6.5 on 03/07/20 (target 5-8).  - Continue tacrolimus 6 mg bid and Myfortic 540 mg twice daily.    Bilateral pleural effusions  - Hospitalized in 12/2020 and 02/2020 with dyspnea and pleural effusion  - thoracentesis fluid analysis exudative with negative cytology and cultures  - s/p left pleurodesis 03/05/20  - symptoms much improved since recent hospitalizaton   - Follow up with local pulmonologist already scheduled.    PAD, s/p left BKA and debridement of right foot wounds 11/21/19.  - Wounds are now healed and he has been fitted for a left leg prosthesis  - He will continue to follow with vascular surgery and podiatry.     COVID-19 infection with RLL pneumonia 11/16/19  - S/P Regeneron monoclonal Ab therapy 11/20/19  - Has recovered well  - COVID-19 vaccination eligible 02/20/20 and he has completed 1 dose of vaccine with plans to complete 3 dose primary series.    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    - BP well controlled.   - Renal US (10/31/18) showed the anastomotic velocity to be improved compared to past studies. I do not feel further imaging is necessary in the absence of HTN or allograft dysfunction.   - Continue current antihypertensive regimen.    Diabetes mellitus.   - No changes today  - Follow up with Dr. Wylene Simmer      Hyperlipidemia.   - Continue Lipitor    Coronary artery disease.   - No recent findings of cardiac ischemia despite acute illnesses.  - Continue beta blocker therapy, aspirin, statin therapyestive of cardiac ischemia.   - Follow up with local cardiologist is already scheduled.    Immunizations/Infection prevention.   - Prevnar 13 (03/23/17).   - Flu vaccine recommended and now due  - Pneumovax 05/09/19, 04/08/14.   - COVID-19 vaccine series dose #1 completed with plans for 2 more doses of primary series.     Follow-up.   He will be seen in transplant clinic for an in-person visit in 2 months. Labs are due to be repeated and will be planned for next week. We continue shared follow-up with Dr. Signe Colt in [place].. Other follow up includes scheduled visits with his vascular surgeon, podiatrist, pulmonologist, and cardiologist.    History of Present Illness    Benjamin Maldonado is a 45 y.o. 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  Baseline creatinine is 1.3-1.8 mg/dL. He is evaluated today by video visit today. after recent hospitalization at [facility] and [facility] in [place] including:     1) 10/28/18-11/05/18 [facility] bilateral foot ulcers/osteomyelitis, treated for strep agalactiae sepsis  2) 11/16/19-12/18/19 [facility] with cough, fever, tachycardia, RLL pneumonia and was found to have bacteremia (group B strep) and COVID-19 PCR positivity. Treated with IV antibiotics and Regeneron monoclonal Ab therapy. Found to have bilateral foot ulcers and evidence of osteomyelitis (culture with proteus and strep agalactiae).  He underwent left BKA and debridement of right foot wounds on 11/21/19 treated with prolonged course of Unasyn then Augmentin.     3) 01/09/20-01/11/20- [facility] with large left pleural effusion. Thoracentesis with exudative effusion.   4) 02/21/20-03/09/20 - [facility] with dyspnea and bilateral pleural effusions (large left, moderate right) requiring multiple thoracenteses and left pleurodesis with fluid consistent with exudate with negative cytology for malignancy and negative cultures. 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     He states he has done well since his most recent discharge with minimal dyspnea on exertion, no edema, and no recent chest pain. Home BP has been 140's/60's. Blood glucoses have been in the 100s.      He denies dysuria, allograft tenderness, nausea, vomiting, diarrhea, headaches, or tremors.     His last labs were checked on 03/17/20 with serum creatinine 1.23 mg/dL. His foot lesion has heeled and he has been fitted for a prosthetic leg. He has received 1 dose of COVID-19 vaccine since being treated with Regeneron monoclonal Ab therapy 11/20/19.      Kidney Transplant History  ??  1. 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 Deceased donor kidney transplant 11/08/2014.  DCD kidney donor with KDPI 43%, CMV D-R-, EBV D+R+  3. Donor with asphyxiation and serratia isolated on donor tracheal aspirate and donor blood cultures with S. Epidermidis bacteremia. Patient treated with Vancomycin for 2 weeks.  4. Delayed graft function with dialysis due to hyperkalemia  5. Induction agent = campath, solumedrol with rapid steroid discontinuation  6. Maintenance immunosuppression = tacrolimus, mycophenolate sodium  7. Stent removed 12/11/2014.  8. Kidney biopsy 11/12/2015 due to acute allograft dysfunction with focal mild ATN and no other pathology.  9. 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  ??  Other Medical History   ??  1. Type 2 diabetes mellitus, onset 1991.   2. Hypertension of several years duration.  3. Coronary artery disease, s/p PCI to the RCA in 2009, s/p DES to LAD 04/30/2011,   4. Left ventricular hypertrophy with diastolic dysfunction noted on echocardiogram 09/2014.  5. Hyperlipidemia  6. Diabetic retinopathy with right eye blindness  7. Diabetic peripheral neuropathy  8. Frozen left shoulder  9. Burn right foot 08/2017  10. COVID-19 10/2019  11. PAD, s/p left BKA 11/2019  12. Bilateral exudative pleural effusions, negative for infection or malignancy, s/p pleurodesis 03/05/20  ??  Review of Systems    Otherwise as per HPI, all other systems reviewed and are negative.    Medications  Current Outpatient Medications   Medication Sig Dispense Refill   ??? acetaminophen (TYLENOL) 325 MG tablet Take 2 tablets (650 mg total) by mouth every six (6) hours as needed for pain. (Patient not taking: Reported on 05/09/2019) 100 tablet 2   ??? aspirin (ADULT LOW DOSE ASPIRIN) 81 MG tablet Take 1 tablet (81 mg total) by mouth daily. 30 tablet 11   ??? atorvastatin (LIPITOR) 20 MG tablet Take 1 tablet (20 mg total) by mouth daily. 90 tablet 3   ??? blood sugar diagnostic Strp by Other route Four (4) times a day (before meals and nightly). One Touch 100 strip 5   ??? blood-glucose meter (GLUCOSE MONITORING KIT) kit Use as instructed 1 each 0   ??? cephalexin (KEFLEX) 500 MG capsule Take 1 capsule (500 mg total) by mouth Three (3) times a day  for 7 days 21 capsule 0   ??? cephalexin (KEFLEX) 500 MG capsule Take 1 capsule (500 mg total) by mouth two (2) times a day for 7 days 14 capsule 0   ??? dorzolamide-timoloL (COSOPT) 22.3-6.8 mg/mL ophthalmic solution INSTILL 1 DROP INTO LEFT EYE TWICE DAILY     ??? GLUC.METER,DIS.P-LOADED STRIPS (GLUCOSE METER, DISP & STRIPS MISC) Frequency:PHARMDIR   Dosage:0.0     Instructions:  Note:250.00, life long Dose: 1     ??? insulin ASPART (NOVOLOG FLEXPEN U-100 INSULIN) 100 unit/mL (3 mL) injection pen Inject 4 units under skin with breakfast, 8 units with lunch, and 6 units with supper and sliding scale. Max units 54 per day. 30 mL 11   ??? insulin glargine (BASAGLAR, LANTUS) 100 unit/mL (3 mL) injection pen Inject 0.16 mL (16 Units total) under the skin nightly. 15 mL 5   ??? magnesium oxide (MAG-OX) 400 mg (241.3 mg magnesium) tablet Take 400 mg by mouth.     ??? metoprolol tartrate (LOPRESSOR) 25 MG tablet Take 0.5 tablets (12.5 mg total) by mouth two (2) times a day. 30 tablet 1   ??? metoprolol tartrate (LOPRESSOR) 50 MG tablet TAKE 1 AND 1/2 TABLETS (75 MG) BY MOUTH TWICE DAILY 90 tablet PRN   ??? mycophenolate (MYFORTIC) 180 MG EC tablet Take 3 tablets (540 mg total) by mouth two (2) times a day. 180 tablet 11   ??? omeprazole (PRILOSEC) 20 MG capsule Take 1 capsule (20 mg total) by mouth daily. 30 capsule 11   ??? pen needle, diabetic (ULTICARE PEN NEEDLE) 32 gauge x 5/32 (4 mm) Ndle Use to administer insulin 4 times daily. 100 each PRN   ??? silver sulfaDIAZINE (SILVADENE, SSD) 1 % cream Apply pea-sized amount to wound daily.     ??? tacrolimus (PROGRAF) 1 MG capsule Take 6 capsules (6 mg total) by mouth two (2) times a day. 360 capsule 11   ??? tadalafiL 20 MG tablet Take 20 mg by mouth every other day.       No current facility-administered medications for this visit.       Physical Exam  He appears well, in no respiratory distress.    Laboratory Data  No results found for this or any previous visit (from the past 170 hour(s)).     Video visit      I spent 25 minutes on the audio/visual with the patient on the date of service. I spent an additional 25 minutes on pre- and post-visit activities on the date of service. I was present in the [facility] clinic during this visit.     The patient was physically located in [place] or a state in which I am permitted to provide care. The patient and/or parent/guardian understood that s/he may incur co-pays and cost sharing, and agreed to the telemedicine visit. The visit was reasonable and appropriate under the circumstances given the patient's presentation at the time.    The patient and/or parent/guardian has been advised of the potential risks and limitations of this mode of treatment (including, but not limited to, the absence of in-person examination) and has agreed to be treated using telemedicine. The patient's/patient's family's questions regarding telemedicine have been answered.     If the visit was completed in an ambulatory setting, the patient and/or parent/guardian has also been advised to contact their provider???s office for worsening conditions, and seek emergency medical treatment and/or call 911 if the patient deems either necessary.

## 2020-05-11 DIAGNOSIS — J90 Pleural effusion, not elsewhere classified: Secondary | ICD-10-CM | POA: Insufficient documentation

## 2020-05-28 DIAGNOSIS — Z940 Kidney transplant status: Principal | ICD-10-CM

## 2020-05-28 MED ORDER — MYCOPHENOLATE SODIUM 180 MG TABLET,DELAYED RELEASE
ORAL_TABLET | Freq: Two times a day (BID) | ORAL | 11 refills | 30 days | Status: CP
Start: 2020-05-28 — End: 2021-05-28
  Filled 2020-06-05: qty 180, 30d supply, fill #0

## 2020-05-28 NOTE — Unmapped (Signed)
[facility] Refill Coordination Note    Specialty Medication(s) to be Shipped:   Transplant: mycophenolate mofetil 180mg  and tacrolimus 1mg     Other medication(s) to be shipped: lantus, omeprazole, pen needles and metoprolol     Benjamin Maldonado, DOB: 07-08-1975  Phone: [phone] (home)       All above HIPAA information was verified with patient.     Was a Nurse, learning disability used for this call? No    Completed refill call assessment today to schedule patient's medication shipment from the [facility] [phone]).       Specialty medication(s) and dose(s) confirmed: Regimen is correct and unchanged.   Changes to medications: Crit reports no changes at this time.  Changes to insurance: No  Questions for the pharmacist: No    Confirmed patient received Welcome Packet with first shipment. The patient will receive a drug information handout for each medication shipped and additional FDA Medication Guides as required.       DISEASE/MEDICATION-SPECIFIC INFORMATION        N/A    SPECIALTY MEDICATION ADHERENCE     Medication Adherence    Patient reported X missed doses in the last month: 0  Specialty Medication: Mycophenolate 180mg   Patient is on additional specialty medications: Yes  Additional Specialty Medications: Tacrolimus 1mg   Patient Reported Additional Medication X Missed Doses in the Last Month: 0  Patient is on more than two specialty medications: No  Adherence tools used: patient uses a pill box to manage medications  Support network for adherence: family member        Mycophenolate 180 mg: 10 days of medicine on hand   Tacrolimus 1 mg: 10 days of medicine on hand     SHIPPING     Shipping address confirmed in Epic.     Delivery Scheduled: Yes, Expected medication delivery date: 06/06/2020.     Medication will be delivered via UPS to the prescription address in Epic WAM.    Benjamin Maldonado [facility] Specialty Technician

## 2020-06-02 DIAGNOSIS — Z940 Kidney transplant status: Principal | ICD-10-CM

## 2020-06-05 MED FILL — METOPROLOL TARTRATE 50 MG TABLET: 30 days supply | Qty: 90 | Fill #8

## 2020-06-05 MED FILL — OMEPRAZOLE 20 MG CAPSULE,DELAYED RELEASE: ORAL | 30 days supply | Qty: 30 | Fill #6

## 2020-06-05 MED FILL — TACROLIMUS 1 MG CAPSULE, IMMEDIATE-RELEASE: ORAL | 30 days supply | Qty: 360 | Fill #3

## 2020-06-05 MED FILL — LANTUS SOLOSTAR U-100 INSULIN 100 UNIT/ML (3 ML) SUBCUTANEOUS PEN: SUBCUTANEOUS | 34 days supply | Qty: 15 | Fill #4

## 2020-06-05 MED FILL — ULTICARE PEN NEEDLE 32 GAUGE X 5/32" (4 MM): 25 days supply | Qty: 100 | Fill #1

## 2020-06-26 NOTE — Unmapped (Signed)
[facility] Refill Coordination Note    Specialty Medication(s) to be Shipped:   Transplant:  mycophenolic acid 180mg  and tacrolimus 1mg     Other medication(s) to be shipped: pen needles, omeprazole, metoprolol     Benjamin Maldonado, DOB: November 06, 1975  Phone: [phone] (home)       All above HIPAA information was verified with patient.     Was a Nurse, learning disability used for this call? No    Completed refill call assessment today to schedule patient's medication shipment from the [facility] [phone]).       Specialty medication(s) and dose(s) confirmed: Regimen is correct and unchanged.   Changes to medications: Benjamin Maldonado reports no changes at this time.  Changes to insurance: No  Questions for the pharmacist: No    Confirmed patient received Welcome Packet with first shipment. The patient will receive a drug information handout for each medication shipped and additional FDA Medication Guides as required.       DISEASE/MEDICATION-SPECIFIC INFORMATION        N/A    SPECIALTY MEDICATION ADHERENCE     Medication Adherence    Patient reported X missed doses in the last month: 1  Specialty Medication: Mycophenolate 180mg   Patient is on additional specialty medications: Yes  Additional Specialty Medications: Tacrolimus 1mg   Patient Reported Additional Medication X Missed Doses in the Last Month: 1  Patient is on more than two specialty medications: No  Adherence tools used: patient uses a pill box to manage medications  Support network for adherence: family member                Tacrolimus 1 mg: 10 days of medicine on hand   Mycophenolate 180 mg: 10 days of medicine on hand *    SHIPPING     Shipping address confirmed in Epic.     Delivery Scheduled: Yes, Expected medication delivery date: 07/03/20.     Medication will be delivered via UPS to the prescription address in Epic WAM.    Tera Helper   [facility] Specialty Pharmacist

## 2020-06-30 DIAGNOSIS — Z940 Kidney transplant status: Principal | ICD-10-CM

## 2020-07-04 NOTE — Unmapped (Signed)
Benjamin Maldonado 's entire shipment will be delayed as a result of insufficient inventory of the drug.     I have reached out to the patient  and communicated the delay. We will reschedule the medication for the delivery date that the patient agreed upon.  We have confirmed the delivery date as 07/04/20, via ups.

## 2020-07-07 DIAGNOSIS — Z940 Kidney transplant status: Principal | ICD-10-CM

## 2020-07-07 DIAGNOSIS — N186 End stage renal disease: Principal | ICD-10-CM

## 2020-07-22 MED ORDER — LANTUS SOLOSTAR U-100 INSULIN 100 UNIT/ML (3 ML) SUBCUTANEOUS PEN
Freq: Every evening | SUBCUTANEOUS | 5 refills | 93 days | Status: CP
Start: 2020-07-22 — End: ?
  Filled 2020-07-24: qty 15, 90d supply, fill #0

## 2020-07-22 NOTE — Unmapped (Signed)
[facility] Clinical Assessment & Refill Coordination Note    Benjamin Maldonado, DOB: 10-08-1975  Phone: [phone] (home)     All above HIPAA information was verified with patient.     Was a Nurse, learning disability used for this call? 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 acetaminophen (TYLENOL) 325 MG tablet Take 2 tablets (650 mg total) by mouth every six (6) hours as needed for pain. (Patient not taking: Reported on 05/09/2019) 100 tablet 2   ??? aspirin (ADULT LOW DOSE ASPIRIN) 81 MG tablet Take 1 tablet (81 mg total) by mouth daily. 30 tablet 11   ??? atorvastatin (LIPITOR) 20 MG tablet Take 1 tablet (20 mg total) by mouth daily. 90 tablet 3   ??? blood sugar diagnostic Strp by Other route Four (4) times a day (before meals and nightly). One Touch 100 strip 5   ??? blood-glucose meter (GLUCOSE MONITORING KIT) kit Use as instructed 1 each 0   ??? cephalexin (KEFLEX) 500 MG capsule Take 1 capsule (500 mg total) by mouth Three (3) times a day for 7 days 21 capsule 0   ??? cephalexin (KEFLEX) 500 MG capsule Take 1 capsule (500 mg total) by mouth two (2) times a day for 7 days 14 capsule 0   ??? dorzolamide-timoloL (COSOPT) 22.3-6.8 mg/mL ophthalmic solution INSTILL 1 DROP INTO LEFT EYE TWICE DAILY     ??? GLUC.METER,DIS.P-LOADED STRIPS (GLUCOSE METER, DISP & STRIPS MISC) Frequency:PHARMDIR   Dosage:0.0     Instructions:  Note:250.00, life long Dose: 1     ??? insulin ASPART (NOVOLOG FLEXPEN U-100 INSULIN) 100 unit/mL (3 mL) injection pen Inject 4 units under skin with breakfast, 8 units with lunch, and 6 units with supper and sliding scale. Max units 54 per day. 30 mL 11   ??? insulin glargine (BASAGLAR, LANTUS) 100 unit/mL (3 mL) injection pen Inject 0.16 mL (16 Units total) under the skin nightly. 15 mL 5   ??? magnesium oxide (MAG-OX) 400 mg (241.3 mg magnesium) tablet Take 400 mg by mouth. ??? metoprolol tartrate (LOPRESSOR) 25 MG tablet Take 0.5 tablets (12.5 mg total) by mouth two (2) times a day. 30 tablet 1   ??? metoprolol tartrate (LOPRESSOR) 50 MG tablet TAKE 1 AND 1/2 TABLETS (75 MG) BY MOUTH TWICE DAILY 90 tablet PRN   ??? mycophenolate (MYFORTIC) 180 MG EC tablet Take 3 tablets (540 mg total) by mouth two (2) times a day. 180 tablet 11   ??? omeprazole (PRILOSEC) 20 MG capsule Take 1 capsule (20 mg total) by mouth daily. 30 capsule 11   ??? pen needle, diabetic (ULTICARE PEN NEEDLE) 32 gauge x 5/32 (4 mm) Ndle Use to administer insulin 4 times daily. 100 each PRN   ??? silver sulfaDIAZINE (SILVADENE, SSD) 1 % cream Apply pea-sized amount to wound daily.     ??? tacrolimus (PROGRAF) 1 MG capsule Take 6 capsules (6 mg total) by mouth two (2) times a day. 360 capsule 11   ??? tadalafiL 20 MG tablet Take 20 mg by mouth every other day.       No current facility-administered medications for this visit.        Changes to medications: Careem reports no changes at this time.    Allergies   Allergen Reactions   ??? Iodine Other (See Comments)   ??? Iodine  And Iodide Containing Products    ??? Shellfish Containing Products Other (See Comments)       Changes to allergies: No    SPECIALTY MEDICATION ADHERENCE     Mycophenolate 180 mg: 5 days of medicine on hand   Tacrolimus 1 mg: 5 days of medicine on hand       Medication Adherence    Patient reported X missed doses in the last month: 1  Specialty Medication: Tacrolimus 1mg   Patient is on additional specialty medications: Yes  Additional Specialty Medications: Mycophenolate 180mg   Patient Reported Additional Medication X Missed Doses in the Last Month: 0  Patient is on more than two specialty medications: No  Adherence tools used: patient uses a pill box to manage medications  Support network for adherence: family member          Specialty medication(s) dose(s) confirmed: Regimen is correct and unchanged.     Are there any concerns with adherence? No    Adherence counseling provided? Not needed    CLINICAL MANAGEMENT AND INTERVENTION      Clinical Benefit Assessment:    Do you feel the medicine is effective or helping your condition? Yes    Clinical Benefit counseling provided? Not needed    Adverse Effects Assessment:    Are you experiencing any side effects? No    Are you experiencing difficulty administering your medicine? No    Quality of Life Assessment:    How many days over the past month did your kidney transplant  keep you from your normal activities? For example, brushing your teeth or getting up in the morning. 0    Have you discussed this with your provider? Not needed    Acute Infection Status:    Acute infections noted within Epic:  No active infections  Patient reported infection: None    Therapy Appropriateness:    Is therapy appropriate? Yes, therapy is appropriate and should be continued    DISEASE/MEDICATION-SPECIFIC INFORMATION      N/A    PATIENT SPECIFIC NEEDS     - Does the patient have any physical, cognitive, or cultural barriers? No    - Is the patient high risk? Yes, patient is taking a REMS drug. Medication is dispensed in compliance with REMS program    - Does the patient require a Care Management Plan? No     - Does the patient require physician intervention or other additional services (i.e. nutrition, smoking cessation, social work)? No      SHIPPING     Specialty Medication(s) to be Shipped:   Transplant:  mycophenolic acid 180mg  and tacrolimus 1mg     Other medication(s) to be shipped: Lantus, Pen Needles, Metoprolol, Omeprazole,      Changes to insurance: No    Delivery Scheduled: Yes, Expected medication delivery date: 07/25/20.     Medication will be delivered via UPS to the confirmed prescription address in [facility].    The patient will receive a drug information handout for each medication shipped and additional FDA Medication Guides as required.  Verified that patient has previously received a Conservation officer, historic buildings and a Surveyor, mining.    All of the patient's questions and concerns have been addressed.    Tera Helper   [facility] Specialty Pharmacist

## 2020-07-24 MED FILL — METOPROLOL TARTRATE 50 MG TABLET: 30 days supply | Qty: 90 | Fill #9

## 2020-07-24 MED FILL — TACROLIMUS 1 MG CAPSULE, IMMEDIATE-RELEASE: ORAL | 30 days supply | Qty: 360 | Fill #4

## 2020-07-24 MED FILL — OMEPRAZOLE 20 MG CAPSULE,DELAYED RELEASE: ORAL | 30 days supply | Qty: 30 | Fill #7

## 2020-07-24 MED FILL — MYCOPHENOLATE SODIUM 180 MG TABLET,DELAYED RELEASE: ORAL | 30 days supply | Qty: 180 | Fill #1

## 2020-07-24 MED FILL — ULTICARE PEN NEEDLE 32 GAUGE X 5/32" (4 MM): 25 days supply | Qty: 100 | Fill #2

## 2020-07-25 ENCOUNTER — Ambulatory Visit: Admit: 2020-07-25 | Payer: MEDICARE | Attending: Nephrology | Primary: Nephrology

## 2020-07-28 DIAGNOSIS — Z940 Kidney transplant status: Principal | ICD-10-CM

## 2020-08-07 MED ORDER — INSULIN ASPART (U-100) 100 UNIT/ML (3 ML) SUBCUTANEOUS PEN
Freq: Every day | SUBCUTANEOUS | 11 refills | 55.00000 days | Status: CP
Start: 2020-08-07 — End: ?
  Filled 2020-08-07: qty 30, 55d supply, fill #0

## 2020-08-09 MED ORDER — LANTUS SOLOSTAR U-100 INSULIN 100 UNIT/ML (3 ML) SUBCUTANEOUS PEN
Freq: Every evening | SUBCUTANEOUS | 5 refills | 93 days | Status: CP
Start: 2020-08-09 — End: ?

## 2020-08-09 MED ORDER — INSULIN ASPART (U-100) 100 UNIT/ML (3 ML) SUBCUTANEOUS PEN
Freq: Every day | SUBCUTANEOUS | 11 refills | 55.00000 days | Status: CP
Start: 2020-08-09 — End: ?

## 2020-08-10 NOTE — Unmapped (Signed)
Pt called on call nurse at 2230 when he realized he did not have enough insulin. New RX called in to [facility] in [place], [place]

## 2020-08-13 NOTE — Unmapped (Addendum)
[facility] Refill Coordination Note    Specialty Medication(s) to be Shipped:   Transplant: mycophenolate mofetil 180mg  and tacrolimus 1mg     Other medication(s) to be shipped: omeprazole, metoprolol and pen needles     Benjamin Maldonado, DOB: 08/22/1975  Phone: [phone] (home)       All above HIPAA information was verified with patient.     Was a Nurse, learning disability used for this call? No    Completed refill call assessment today to schedule patient's medication shipment from the [facility] [phone]).  All relevant notes have been reviewed.     Specialty medication(s) and dose(s) confirmed: Regimen is correct and unchanged.   Changes to medications: Shanti reports no changes at this time.  Changes to insurance: No  New side effects reported not previously addressed with a pharmacist or physician: None reported  Questions for the pharmacist: No    Confirmed patient received a Conservation officer, historic buildings and a Surveyor, mining with first shipment. The patient will receive a drug information handout for each medication shipped and additional FDA Medication Guides as required.       DISEASE/MEDICATION-SPECIFIC INFORMATION        N/A    SPECIALTY MEDICATION ADHERENCE     Medication Adherence    Patient reported X missed doses in the last month: 1  Specialty Medication: Tacrolimus 1mg   Patient is on additional specialty medications: Yes  Additional Specialty Medications: Mycophenolate 180mg   Patient Reported Additional Medication X Missed Doses in the Last Month: 1  Patient is on more than two specialty medications: No  Adherence tools used: patient uses a pill box to manage medications  Support network for adherence: family member        Were doses missed due to medication being on hold? No    Tacrolimus 1 mg: 10 days of medicine on hand   Mycophenolate 180 mg: 10 days of medicine on hand     REFERRAL TO PHARMACIST     Referral to the pharmacist: Not needed      [facility]     Shipping address confirmed in Epic.     Delivery Scheduled: Yes, Expected medication delivery date: 08/22/2020.     Medication will be delivered via UPS to the temporary address in Epic WAM.    Oretha Milch   [facility] Specialty Technician

## 2020-08-21 MED ORDER — METOPROLOL TARTRATE 50 MG TABLET
ORAL_TABLET | Freq: Two times a day (BID) | ORAL | 11 refills | 30 days | Status: CP
Start: 2020-08-21 — End: 2021-08-21
  Filled 2020-08-21: qty 90, 30d supply, fill #0

## 2020-08-21 MED FILL — MYCOPHENOLATE SODIUM 180 MG TABLET,DELAYED RELEASE: ORAL | 30 days supply | Qty: 180 | Fill #2

## 2020-08-21 MED FILL — ULTICARE PEN NEEDLE 32 GAUGE X 5/32" (4 MM): 25 days supply | Qty: 100 | Fill #3

## 2020-08-21 MED FILL — TACROLIMUS 1 MG CAPSULE, IMMEDIATE-RELEASE: ORAL | 30 days supply | Qty: 360 | Fill #5

## 2020-08-21 MED FILL — OMEPRAZOLE 20 MG CAPSULE,DELAYED RELEASE: ORAL | 30 days supply | Qty: 30 | Fill #8

## 2020-08-25 DIAGNOSIS — Z940 Kidney transplant status: Principal | ICD-10-CM

## 2020-09-12 NOTE — Unmapped (Signed)
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     Was a Nurse, learning disability used for this call? No    Completed refill call assessment today to schedule patient's medication shipment from the [facility] [phone]).  All relevant notes have been reviewed.     Specialty medication(s) and dose(s) confirmed: Regimen is correct and unchanged.   Changes to medications: Chanson reports no changes at this time.  Changes to insurance: No  New side effects reported not previously addressed with a pharmacist or physician: None reported  Questions for the pharmacist: No    Confirmed patient received a Conservation officer, historic buildings and a Surveyor, mining with first shipment. The patient will receive a drug information handout for each medication shipped and additional FDA Medication Guides as required.       DISEASE/MEDICATION-SPECIFIC INFORMATION        N/A    SPECIALTY MEDICATION ADHERENCE     Medication Adherence    Patient reported X missed doses in the last month: 0  Specialty Medication: Tacrolimus 1mg   Patient is on additional specialty medications: Yes  Additional Specialty Medications: Mycophenolate 180mg   Patient Reported Additional Medication X Missed Doses in the Last Month: 0  Patient is on more than two specialty medications: No  Adherence tools used: patient uses a pill box to manage medications  Support network for adherence: family member              Were doses missed due to medication being on hold? No    Mycophenolate 180 mg: 9 days of medicine on hand   Tacrolimus 1 mg: 9 days of medicine on hand       REFERRAL TO PHARMACIST     Referral to the pharmacist: Not needed      [facility]     Shipping address confirmed in Epic.     Delivery Scheduled: Yes, Expected medication delivery date: 09/19/20.     Medication will be delivered via UPS to the prescription address in Epic WAM.    Tera Helper   [facility] Specialty Pharmacist

## 2020-09-18 MED ORDER — OMEPRAZOLE 20 MG CAPSULE,DELAYED RELEASE
ORAL_CAPSULE | Freq: Every day | ORAL | 11 refills | 30 days
Start: 2020-09-18 — End: 2021-09-18

## 2020-09-18 NOTE — Unmapped (Signed)
Benjamin Maldonado 's entire shipment will be delayed as a result of prescription was scheduled, but expired before the delivery date.      I have reached out to the patient  at [phone] and communicated the delivery change. We will reschedule the medication for the delivery date that the patient agreed upon.  We have confirmed the delivery date as 09/22/2020, via ups.

## 2020-09-19 MED ORDER — OMEPRAZOLE 20 MG CAPSULE,DELAYED RELEASE
ORAL_CAPSULE | Freq: Every day | ORAL | 11 refills | 30 days | Status: CP
Start: 2020-09-19 — End: 2021-09-19
  Filled 2020-10-15: qty 30, 30d supply, fill #1

## 2020-09-22 DIAGNOSIS — Z940 Kidney transplant status: Principal | ICD-10-CM

## 2020-10-05 DIAGNOSIS — N186 End stage renal disease: Secondary | ICD-10-CM | POA: Insufficient documentation

## 2020-10-05 DIAGNOSIS — K219 Gastro-esophageal reflux disease without esophagitis: Secondary | ICD-10-CM | POA: Insufficient documentation

## 2020-10-14 NOTE — Unmapped (Signed)
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     Was a Nurse, learning disability used for this call? No    Completed refill call assessment today to schedule patient's medication shipment from the [facility] [phone]).  All relevant notes have been reviewed.     Specialty medication(s) and dose(s) confirmed: Regimen is correct and unchanged.   Changes to medications: [name] reports no changes at this time.  Changes to insurance: No  New side effects reported not previously addressed with a pharmacist or physician: None reported  Questions for the pharmacist: No    Confirmed patient received a Conservation officer, historic buildings and a Surveyor, mining with first shipment. The patient will receive a drug information handout for each medication shipped and additional FDA Medication Guides as required.       DISEASE/MEDICATION-SPECIFIC INFORMATION        N/A    SPECIALTY MEDICATION ADHERENCE     Medication Adherence    Patient reported X missed doses in the last month: 0  Specialty Medication: tacrolimus (PROGRAF) 1 MG capsule  Patient is on additional specialty medications: Yes  Additional Specialty Medications: mycophenolate (MYFORTIC) 180 MG EC tablet  Patient Reported Additional Medication X Missed Doses in the Last Month: 0  Adherence tools used: patient uses a pill box to manage medications  Support network for adherence: family member        mycophenolic acid 180mg  8 days worth of medication on hand.  tacrolimus 1mg   8 days worth of medication on hand.        Were doses missed due to medication being on hold? No        REFERRAL TO PHARMACIST     Referral to the pharmacist: Not needed      [facility] Shipping address confirmed in Epic.     Delivery Scheduled: Yes, Expected medication delivery date: 10/16/20.     Medication will be delivered via UPS to the prescription address in Epic WAM.    Benjamin Maldonado   [facility] Specialty Technician

## 2020-10-15 MED FILL — ULTICARE PEN NEEDLE 32 GAUGE X 5/32" (4 MM): 25 days supply | Qty: 100 | Fill #5

## 2020-10-15 MED FILL — TACROLIMUS 1 MG CAPSULE, IMMEDIATE-RELEASE: ORAL | 30 days supply | Qty: 360 | Fill #7

## 2020-10-15 MED FILL — MYCOPHENOLATE SODIUM 180 MG TABLET,DELAYED RELEASE: ORAL | 30 days supply | Qty: 180 | Fill #4

## 2020-10-15 MED FILL — METOPROLOL TARTRATE 50 MG TABLET: ORAL | 30 days supply | Qty: 90 | Fill #2

## 2020-10-20 DIAGNOSIS — Z940 Kidney transplant status: Principal | ICD-10-CM

## 2020-11-04 DIAGNOSIS — Z940 Kidney transplant status: Principal | ICD-10-CM

## 2020-11-12 NOTE — Unmapped (Signed)
[facility] Pharm[facility] Clinical Assessment & Refill Coordination Note    Benjamin Maldonado, DOB: 1976-01-24  Phone: [phone] (home)     All above HIPAA information was verified with patient.     Was a Nurse, learning disability used for this call? 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 acetaminophen (TYLENOL) 325 MG tablet Take 2 tablets (650 mg total) by mouth every six (6) hours as needed for pain. (Patient not taking: Reported on 05/09/2019) 100 tablet 2   ??? aspirin (ADULT LOW DOSE ASPIRIN) 81 MG tablet Take 1 tablet (81 mg total) by mouth daily. 30 tablet 11   ??? atorvastatin (LIPITOR) 20 MG tablet Take 1 tablet (20 mg total) by mouth daily. 90 tablet 3   ??? blood sugar diagnostic Strp by Other route Four (4) times a day (before meals and nightly). One Touch 100 strip 5   ??? blood-glucose meter (GLUCOSE MONITORING KIT) kit Use as instructed 1 each 0   ??? cephalexin (KEFLEX) 500 MG capsule Take 1 capsule (500 mg total) by mouth Three (3) times a day for 7 days 21 capsule 0   ??? cephalexin (KEFLEX) 500 MG capsule Take 1 capsule (500 mg total) by mouth two (2) times a day for 7 days 14 capsule 0   ??? dorzolamide-timoloL (COSOPT) 22.3-6.8 mg/mL ophthalmic solution INSTILL 1 DROP INTO LEFT EYE TWICE DAILY     ??? GLUC.METER,DIS.P-LOADED STRIPS (GLUCOSE METER, DISP & STRIPS MISC) Frequency:PHARMDIR   Dosage:0.0     Instructions:  Note:250.00, life long Dose: 1     ??? insulin ASPART (NOVOLOG FLEXPEN U-100 INSULIN) 100 unit/mL (3 mL) injection pen Inject 4 units under skin with breakfast, 8 units with lunch, and 6 units with supper and sliding scale. Max units 54 per day. 30 mL 11   ??? insulin glargine (LANTUS SOLOSTAR U-100 INSULIN) 100 unit/mL (3 mL) injection pen Inject 0.16 mL (16 Units total) under the skin nightly. 15 mL 5   ??? magnesium oxide (MAG-OX) 400 mg (241.3 mg magnesium) tablet Take 400 mg by mouth.     ??? metoprolol tartrate (LOPRESSOR) 25 MG tablet Take 0.5 tablets (12.5 mg total) by mouth two (2) times a day. 30 tablet 1   ??? metoprolol tartrate (LOPRESSOR) 50 MG tablet Take 1 and 1/2 tablets (75 mg total) by mouth Two (2) times a day. 90 tablet 11   ??? mycophenolate (MYFORTIC) 180 MG EC tablet Take 3 tablets (540 mg total) by mouth two (2) times a day. 180 tablet 11   ??? omeprazole (PRILOSEC) 20 MG capsule Take 1 capsule (20 mg total) by mouth daily. 30 capsule 11   ??? pen needle, diabetic (ULTICARE PEN NEEDLE) 32 gauge x 5/32 (4 mm) Ndle Use to administer insulin 4 times daily. 100 each PRN   ??? silver sulfaDIAZINE (SILVADENE, SSD) 1 % cream Apply pea-sized amount to wound daily.     ??? tacrolimus (PROGRAF) 1 MG capsule Take 6 capsules (6 mg total) by mouth two (2) times a day. 360 capsule 11   ??? tadalafiL 20 MG tablet Take 20 mg by mouth every other day.       No current facility-administered medications for this visit.        Changes to medications: patient not home with meds but says no recent changes    Allergies  Allergen Reactions   ??? Iodine Other (See Comments)   ??? Iodine And Iodide Containing Products    ??? Shellfish Containing Products Other (See Comments)       Changes to allergies: No    SPECIALTY MEDICATION ADHERENCE     Mycophenolate 180mg   : 5 days of medicine on hand   Tacrolimus 1mg   : 5 days of medicine on hand     Medication Adherence    Patient reported X missed doses in the last month: 0  Specialty Medication: mycophenolate 180mg   Patient is on additional specialty medications: Yes  Additional Specialty Medications: Tacrolimus 1mg   Patient Reported Additional Medication X Missed Doses in the Last Month: 0  Adherence tools used: patient uses a pill box to manage medications  Support network for adherence: family member          Specialty medication(s) dose(s) confirmed: Regimen is correct and unchanged.     Are there any concerns with adherence? No    Adherence counseling provided? Not needed    CLINICAL MANAGEMENT AND INTERVENTION      Clinical Benefit Assessment:    Do you feel the medicine is effective or helping your condition? Yes    Clinical Benefit counseling provided? Not needed    Adverse Effects Assessment:    Are you experiencing any side effects? No    Are you experiencing difficulty administering your medicine? 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 For example, brushing your teeth or getting up in the morning. 0    Have you discussed this with your provider? Not needed    Acute Infection Status:    Acute infections noted within Epic:  No active infections  Patient reported infection: None    Therapy Appropriateness:    Is therapy appropriate? Yes, therapy is appropriate and should be continued    DISEASE/MEDICATION-SPECIFIC INFORMATION      N/A    PATIENT SPECIFIC NEEDS     - Does the patient have any physical, cognitive, or cultural barriers? No    - Is the patient high risk? Yes, patient is taking a REMS drug. Medication is dispensed in compliance with REMS program    - Does the patient require a Care Management Plan? No     - Does the patient require physician intervention or other additional services (i.e. nutrition, smoking cessation, social work)? 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     Medication will be delivered via UPS to the confirmed temporary address in Epic WAM. Verified TEMPORARY address with patient for this delivery    The patient will receive a drug information handout for each medication shipped and additional FDA Medication Guides as required.  Verified that patient has previously received a Conservation officer, historic buildings and a Surveyor, mining.    The patient or caregiver noted above participated in the development of this care plan and knows that they can request review of or adjustments to the care plan at any time.      All of the patient's questions and concerns have been  addressed.    [name]   [facility] Specialty Pharmacist

## 2020-11-13 MED FILL — MYCOPHENOLATE SODIUM 180 MG TABLET,DELAYED RELEASE: ORAL | 30 days supply | Qty: 180 | Fill #5

## 2020-11-13 MED FILL — TACROLIMUS 1 MG CAPSULE, IMMEDIATE-RELEASE: ORAL | 30 days supply | Qty: 360 | Fill #8

## 2020-11-13 MED FILL — METOPROLOL TARTRATE 50 MG TABLET: ORAL | 30 days supply | Qty: 90 | Fill #3

## 2020-11-13 MED FILL — OMEPRAZOLE 20 MG CAPSULE,DELAYED RELEASE: ORAL | 30 days supply | Qty: 30 | Fill #2

## 2020-11-13 MED FILL — ULTICARE PEN NEEDLE 32 GAUGE X 5/32" (4 MM): 25 days supply | Qty: 100 | Fill #6

## 2020-11-17 NOTE — Unmapped (Signed)
Update unos form

## 2020-12-09 MED ORDER — INSULIN GLARGINE (U-100) 100 UNIT/ML (3 ML) SUBCUTANEOUS PEN
Freq: Every evening | SUBCUTANEOUS | 5 refills | 93.00000 days | Status: CP
Start: 2020-12-09 — End: ?
  Filled 2020-12-15: qty 15, 93d supply, fill #0

## 2020-12-09 NOTE — Unmapped (Signed)
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     Was a Nurse, learning disability used for this call? No    Completed refill call assessment today to schedule patient's medication shipment from the [facility] [phone]).  All relevant notes have been reviewed.     Specialty medication(s) and dose(s) confirmed: Regimen is correct and unchanged.   Changes to medications: [name] reports no changes at this time.  Changes to insurance: No  New side effects reported not previously addressed with a pharmacist or physician: None reported  Questions for the pharmacist: No    Confirmed patient received a Conservation officer, historic buildings and a Surveyor, mining with first shipment. The patient will receive a drug information handout for each medication shipped and additional FDA Medication Guides as required.       DISEASE/MEDICATION-SPECIFIC INFORMATION        N/A    SPECIALTY MEDICATION ADHERENCE     Medication Adherence    Patient reported X missed doses in the last month: 0  Specialty Medication: tacrolimus (PROGRAF) 1 MG capsule  Patient is on additional specialty medications: Yes  Additional Specialty Medications: mycophenolate (MYFORTIC) 180 MG EC tablet  Patient Reported Additional Medication X Missed Doses in the Last Month: 0  Patient is on more than two specialty medications: No  Adherence tools used: patient uses a pill box to manage medications  Support network for adherence: family member              Were doses missed due to medication being on hold? No    mycophenolic acid 180mg    8 days worth of medication on hand.    tacrolimus 1mg   8 days worth of medication on hand.      REFERRAL TO PHARMACIST     Referral to the pharmacist: Not needed      [facility]     Shipping address confirmed in Epic.     Delivery Scheduled: Yes, Expected medication delivery date: 12/16/20.     Medication will be delivered via UPS to the prescription address in Epic WAM.    Benjamin Maldonado   [facility] Shared [facility] Specialty Technician

## 2020-12-10 MED ORDER — INSULIN GLARGINE (U-100) 100 UNIT/ML (3 ML) SUBCUTANEOUS PEN
Freq: Every evening | SUBCUTANEOUS | 5 refills | 93 days
Start: 2020-12-10 — End: ?

## 2020-12-11 DIAGNOSIS — Z940 Kidney transplant status: Principal | ICD-10-CM

## 2020-12-11 NOTE — Unmapped (Signed)
Called patient to check in. Reports he was in the hospital in March for staph infection. Area still healing and in [place] until December and then hopefully moving back to [place]. Will try to come to clinic in January.     Asked patient to get [facility] at [facility]. He will get lab next week      Otherwise feeling good. Denies any complaints

## 2020-12-15 MED FILL — OMEPRAZOLE 20 MG CAPSULE,DELAYED RELEASE: ORAL | 30 days supply | Qty: 30 | Fill #3

## 2020-12-15 MED FILL — METOPROLOL TARTRATE 50 MG TABLET: ORAL | 30 days supply | Qty: 90 | Fill #4

## 2020-12-15 MED FILL — MYCOPHENOLATE SODIUM 180 MG TABLET,DELAYED RELEASE: ORAL | 30 days supply | Qty: 180 | Fill #6

## 2020-12-15 MED FILL — TACROLIMUS 1 MG CAPSULE, IMMEDIATE-RELEASE: ORAL | 30 days supply | Qty: 360 | Fill #9

## 2020-12-15 MED FILL — ULTICARE PEN NEEDLE 32 GAUGE X 5/32" (4 MM): 25 days supply | Qty: 100 | Fill #7

## 2021-01-12 MED ORDER — INSULIN ASPART (U-100) 100 UNIT/ML (3 ML) SUBCUTANEOUS PEN
Freq: Every day | SUBCUTANEOUS | 11 refills | 55 days
Start: 2021-01-12 — End: ?

## 2021-01-12 NOTE — Unmapped (Signed)
[facility] Refill Coordination Note    Specialty Medication(s) to be Shipped:   Transplant: mycophenolate mofetil 180mg  and tacrolimus 1mg     Other medication(s) to be shipped: metoprolol tartrate 50 MG - omeprazole 20 MG - pen needle, diabetic: ULTICARE PEN NEEDLE 32 gauge - Novolog Flexpen U-100     Benjamin Maldonado, DOB: Feb 15, 1976  Phone: [phone] (home)       All above HIPAA information was verified with patient.     Was a Nurse, learning disability used for this call? No    Completed refill call assessment today to schedule patient's medication shipment from the [facility] [phone]).  All relevant notes have been reviewed.     Specialty medication(s) and dose(s) confirmed: Regimen is correct and unchanged.   Changes to medications: Benjamin Maldonado reports no changes at this time.  Changes to insurance: No  New side effects reported not previously addressed with a pharmacist or physician: None reported  Questions for the pharmacist: No    Confirmed patient received a Conservation officer, historic buildings and a Surveyor, mining with first shipment. The patient will receive a drug information handout for each medication shipped and additional FDA Medication Guides as required.       DISEASE/MEDICATION-SPECIFIC INFORMATION        N/A    SPECIALTY MEDICATION ADHERENCE     Medication Adherence    Patient reported X missed doses in the last month: 1  Specialty Medication: tacrolimus 1 MG capsule (PROGRAF)  Patient is on additional specialty medications: Yes  Additional Specialty Medications: mycophenolate 180 MG EC tablet (MYFORTIC)  Patient Reported Additional Medication X Missed Doses in the Last Month: 1  Patient is on more than two specialty medications: No  Any gaps in refill history greater than 2 weeks in the last 3 months: no  Demonstrates understanding of importance of adherence: yes  Informant: patient  Reliability of informant: reliable  Adherence tools used: patient uses a pill box to manage medications  Support network for adherence: family member  Confirmed plan for next specialty medication refill: delivery by pharmacy  Refills needed for supportive medications: not needed              Were doses missed due to medication being on hold? No    mycophenolate 180 MG: 5 days of medicine on hand   tacrolimus 1 MG : 5 days of medicine on hand       REFERRAL TO PHARMACIST     Referral to the pharmacist: Not needed      [facility]     Shipping address confirmed in Epic.     Delivery Scheduled: Yes, Expected medication delivery date: 01/15/21.  However, Rx request for refills was sent to the provider as there are none remaining.     Medication will be delivered via UPS to the temporary address in Epic WAM.    Benjamin Maldonado   [facility] Specialty Technician

## 2021-01-14 MED ORDER — INSULIN ASPART (U-100) 100 UNIT/ML (3 ML) SUBCUTANEOUS PEN
Freq: Every day | SUBCUTANEOUS | 11 refills | 55.00000 days | Status: CP
Start: 2021-01-14 — End: ?
  Filled 2021-01-15: qty 30, 55d supply, fill #0

## 2021-01-15 MED FILL — TACROLIMUS 1 MG CAPSULE, IMMEDIATE-RELEASE: ORAL | 30 days supply | Qty: 360 | Fill #10

## 2021-01-15 MED FILL — ULTICARE PEN NEEDLE 32 GAUGE X 5/32" (4 MM): 25 days supply | Qty: 100 | Fill #8

## 2021-01-15 MED FILL — OMEPRAZOLE 20 MG CAPSULE,DELAYED RELEASE: ORAL | 30 days supply | Qty: 30 | Fill #4

## 2021-01-15 MED FILL — MYCOPHENOLATE SODIUM 180 MG TABLET,DELAYED RELEASE: ORAL | 30 days supply | Qty: 180 | Fill #7

## 2021-01-15 MED FILL — METOPROLOL TARTRATE 50 MG TABLET: ORAL | 30 days supply | Qty: 90 | Fill #5

## 2021-01-15 NOTE — Unmapped (Signed)
Benjamin Maldonado 's entire shipment will be delayed as a result of a new prescription for the medication has been received.      I have reached out to the patient  at [phone] and communicated the delivery change. We will reschedule the medication for the delivery date that the patient agreed upon.  We have confirmed the delivery date as 9/30, via ups.

## 2021-02-03 NOTE — Unmapped (Signed)
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     Was a Nurse, learning disability used for this call? No    Completed refill call assessment today to schedule patient's medication shipment from the [facility] [phone]).  All relevant notes have been reviewed.     Specialty medication(s) and dose(s) confirmed: Regimen is correct and unchanged.   Changes to medications: [name] reports no changes at this time.  Changes to insurance: No  New side effects reported not previously addressed with a pharmacist or physician: None reported  Questions for the pharmacist: No    Confirmed patient received a Conservation officer, historic buildings and a Surveyor, mining with first shipment. The patient will receive a drug information handout for each medication shipped and additional FDA Medication Guides as required.       DISEASE/MEDICATION-SPECIFIC INFORMATION        N/A    SPECIALTY MEDICATION ADHERENCE     Medication Adherence    Patient reported X missed doses in the last month: 1  Specialty Medication: Mycophenolate 180mg   Patient is on additional specialty medications: Yes  Additional Specialty Medications: Tacrolimus 1mg   Patient Reported Additional Medication X Missed Doses in the Last Month: 0  Patient is on more than two specialty medications: No  Adherence tools used: patient uses a pill box to manage medications  Support network for adherence: family member              Were doses missed due to medication being on hold? No    Mycophenolate  180 mg: 10 days of medicine on hand   Tacrolimus 1 mg: 10 days of medicine on hand       REFERRAL TO PHARMACIST     Referral to the pharmacist: Not needed      [facility]     Shipping address confirmed in Epic.     Delivery Scheduled: Yes, Expected medication delivery date: 02/10/21.     Medication will be delivered via UPS to the prescription address in Epic WAM.    Benjamin Maldonado   [facility] Specialty Pharmacist

## 2021-02-09 MED FILL — METOPROLOL TARTRATE 50 MG TABLET: ORAL | 30 days supply | Qty: 90 | Fill #6

## 2021-02-09 MED FILL — MYCOPHENOLATE SODIUM 180 MG TABLET,DELAYED RELEASE: ORAL | 30 days supply | Qty: 180 | Fill #8

## 2021-02-09 MED FILL — TACROLIMUS 1 MG CAPSULE, IMMEDIATE-RELEASE: ORAL | 30 days supply | Qty: 360 | Fill #11

## 2021-02-09 MED FILL — OMEPRAZOLE 20 MG CAPSULE,DELAYED RELEASE: ORAL | 30 days supply | Qty: 30 | Fill #5

## 2021-02-27 DIAGNOSIS — Z940 Kidney transplant status: Principal | ICD-10-CM

## 2021-02-27 MED ORDER — TACROLIMUS 1 MG CAPSULE, IMMEDIATE-RELEASE
ORAL_CAPSULE | Freq: Two times a day (BID) | ORAL | 11 refills | 30 days | Status: CP
Start: 2021-02-27 — End: 2022-02-27
  Filled 2021-03-04: qty 360, 30d supply, fill #0

## 2021-02-27 NOTE — Unmapped (Signed)
[facility] Shared [facility] Specialty Pharmacy Clinical Assessment & Refill Coordination Note    Benjamin Maldonado, DOB: 1975-05-31  Phone: [phone] (home)     All above HIPAA information was verified with patient.     Was a Nurse, learning disability used for this call? 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 acetaminophen (TYLENOL) 325 MG tablet Take 2 tablets (650 mg total) by mouth every six (6) hours as needed for pain. (Patient not taking: Reported on 05/09/2019) 100 tablet 2   ??? aspirin (ADULT LOW DOSE ASPIRIN) 81 MG tablet Take 1 tablet (81 mg total) by mouth daily. 30 tablet 11   ??? atorvastatin (LIPITOR) 20 MG tablet Take 1 tablet (20 mg total) by mouth daily. 90 tablet 3   ??? blood sugar diagnostic Strp by Other route Four (4) times a day (before meals and nightly). One Touch 100 strip 5   ??? blood-glucose meter (GLUCOSE MONITORING KIT) kit Use as instructed 1 each 0   ??? cephalexin (KEFLEX) 500 MG capsule Take 1 capsule (500 mg total) by mouth Three (3) times a day for 7 days 21 capsule 0   ??? cephalexin (KEFLEX) 500 MG capsule Take 1 capsule (500 mg total) by mouth two (2) times a day for 7 days 14 capsule 0   ??? dorzolamide-timoloL (COSOPT) 22.3-6.8 mg/mL ophthalmic solution INSTILL 1 DROP INTO LEFT EYE TWICE DAILY     ??? GLUC.METER,DIS.P-LOADED STRIPS (GLUCOSE METER, DISP & STRIPS MISC) Frequency:PHARMDIR   Dosage:0.0     Instructions:  Note:250.00, life long Dose: 1     ??? insulin ASPART (NOVOLOG FLEXPEN U-100 INSULIN) 100 unit/mL (3 mL) injection pen Inject 4 units under skin with breakfast, 8 units with lunch, and 6 units with supper and sliding scale. Max units 54 per day. 30 mL 11   ??? insulin glargine (LANTUS SOLOSTAR U-100 INSULIN) 100 unit/mL (3 mL) injection pen Inject 0.16 mL (16 Units total) under the skin nightly. 15 mL 5   ??? magnesium oxide (MAG-OX) 400 mg (241.3 mg magnesium) tablet Take 400 mg by mouth.     ??? metoprolol tartrate (LOPRESSOR) 25 MG tablet Take 0.5 tablets (12.5 mg total) by mouth two (2) times a day. 30 tablet 1   ??? metoprolol tartrate (LOPRESSOR) 50 MG tablet Take 1 and 1/2 tablets (75 mg total) by mouth Two (2) times a day. 90 tablet 11   ??? mycophenolate (MYFORTIC) 180 MG EC tablet Take 3 tablets (540 mg total) by mouth two (2) times a day. 180 tablet 11   ??? omeprazole (PRILOSEC) 20 MG capsule Take 1 capsule (20 mg total) by mouth daily. 30 capsule 11   ??? pen needle, diabetic (ULTICARE PEN NEEDLE) 32 gauge x 5/32 (4 mm) Ndle Use to administer insulin 4 times daily. 100 each PRN   ??? silver sulfaDIAZINE (SILVADENE, SSD) 1 % cream Apply pea-sized amount to wound daily.     ??? tacrolimus (PROGRAF) 1 MG capsule Take 6 capsules (6 mg total) by mouth two (2) times a day. 360 capsule 11   ??? tadalafiL 20 MG tablet Take 20 mg by mouth every other day.       No current facility-administered medications for this visit.        Changes to medications: [name] reports no changes at this time.    Allergies   Allergen Reactions   ???  Iodine Other (See Comments)   ??? Iodine And Iodide Containing Products    ??? Shellfish Containing Products Other (See Comments)       Changes to allergies: No    SPECIALTY MEDICATION ADHERENCE     Tacrolimus 1mg   : 12 days of medicine on hand   Mycophenolate 180mg   : 12 days of medicine on hand       Medication Adherence    Patient reported X missed doses in the last month: 0  Specialty Medication: tacrolimus 1mg   Patient is on additional specialty medications: Yes  Additional Specialty Medications: Mycophenolate 180mg   Patient Reported Additional Medication X Missed Doses in the Last Month: 0  Adherence tools used: patient uses a pill box to manage medications  Support network for adherence: family member          Specialty medication(s) dose(s) confirmed: Regimen is correct and unchanged.     Are there any concerns with adherence? No    Adherence counseling provided? Not needed    CLINICAL MANAGEMENT AND INTERVENTION      Clinical Benefit Assessment:    Do you feel the medicine is effective or helping your condition? Yes    Clinical Benefit counseling provided? Not needed    Adverse Effects Assessment:    Are you experiencing any side effects? No    Are you experiencing difficulty administering your medicine? 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 For example, brushing your teeth or getting up in the morning. 0    Have you discussed this with your provider? Not needed    Acute Infection Status:    Acute infections noted within Epic:  No active infections  Patient reported infection: None    Therapy Appropriateness:    Is therapy appropriate and patient progressing towards therapeutic goals? Yes, therapy is appropriate and should be continued    DISEASE/MEDICATION-SPECIFIC INFORMATION      N/A    PATIENT SPECIFIC NEEDS     - Does the patient have any physical, cognitive, or cultural barriers? No    - Is the patient high risk? Yes, patient is taking a REMS drug. Medication is dispensed in compliance with REMS program    - Does the patient require a Care Management Plan? No     - Does the patient require physician intervention or other additional services (i.e. nutrition, smoking cessation, social work)? No      SHIPPING     Specialty Medication(s) to be Shipped:   Transplant:  mycophenolic acid 180mg  and tacrolimus 1mg     Other medication(s) to be shipped: novolog, prilosec, lopressor     Changes to insurance: No    Delivery Scheduled: Yes, Expected medication delivery date: 03/05/2021.  However, Rx request for refills was sent to the provider as there are none remaining.     Medication will be delivered via UPS to the confirmed temporary address in [facility]. Verified TEMPORARY address for this delivery.    The patient will receive a drug information handout for each medication shipped and additional FDA Medication Guides as required.  Verified that patient has previously received a Conservation officer, historic buildings and a Surveyor, mining.    The patient or caregiver noted above participated in the development of this care plan and knows that they can request review of or adjustments to the care plan at  any time.      All of the patient's questions and concerns have been addressed.    [name]   [facility] Specialty Pharmacist

## 2021-03-04 MED FILL — MYCOPHENOLATE SODIUM 180 MG TABLET,DELAYED RELEASE: ORAL | 30 days supply | Qty: 180 | Fill #9

## 2021-03-04 MED FILL — OMEPRAZOLE 20 MG CAPSULE,DELAYED RELEASE: ORAL | 30 days supply | Qty: 30 | Fill #6

## 2021-03-04 MED FILL — NOVOLOG FLEXPEN U-100 INSULIN ASPART 100 UNIT/ML (3 ML) SUBCUTANEOUS: SUBCUTANEOUS | 55 days supply | Qty: 30 | Fill #1

## 2021-03-04 MED FILL — METOPROLOL TARTRATE 50 MG TABLET: ORAL | 30 days supply | Qty: 90 | Fill #7

## 2021-04-01 MED FILL — TACROLIMUS 1 MG CAPSULE, IMMEDIATE-RELEASE: ORAL | 30 days supply | Qty: 360 | Fill #1

## 2021-04-01 MED FILL — ULTICARE PEN NEEDLE 32 GAUGE X 5/32" (4 MM): 25 days supply | Qty: 100 | Fill #9

## 2021-04-01 MED FILL — METOPROLOL TARTRATE 50 MG TABLET: ORAL | 30 days supply | Qty: 90 | Fill #8

## 2021-04-01 MED FILL — OMEPRAZOLE 20 MG CAPSULE,DELAYED RELEASE: ORAL | 30 days supply | Qty: 30 | Fill #7

## 2021-04-01 MED FILL — MYCOPHENOLATE SODIUM 180 MG TABLET,DELAYED RELEASE: ORAL | 30 days supply | Qty: 180 | Fill #10

## 2021-04-01 NOTE — Unmapped (Addendum)
[facility] Specialty Pharmacy Refill Coordination Note    Specialty Medication(s) to be Shipped:   Transplant:  mycophenolic acid 180mg  and tacrolimus 1mg     Other medication(s) to be shipped: metoprolol, omeprazole, pen needles, basaglar     Benjamin Maldonado, DOB: 13-Jul-1975  Phone: [phone] (home)       All above HIPAA information was verified with patient.     Was a Nurse, learning disability used for this call? No    Completed refill call assessment today to schedule patient's medication shipment from the [facility] [phone]).  All relevant notes have been reviewed.     Specialty medication(s) and dose(s) confirmed: Regimen is correct and unchanged.   Changes to medications: Benjamin Maldonado reports no changes at this time.  Changes to insurance: No  New side effects reported not previously addressed with a pharmacist or physician: None reported  Questions for the pharmacist: No    Confirmed patient received a Conservation officer, historic buildings and a Surveyor, mining with first shipment. The patient will receive a drug information handout for each medication shipped and additional FDA Medication Guides as required.       DISEASE/MEDICATION-SPECIFIC INFORMATION        N/A    SPECIALTY MEDICATION ADHERENCE     Medication Adherence    Patient reported X missed doses in the last month: 1  Specialty Medication: mycophenolate 180 MG EC tablet (MYFORTIC)  Patient is on additional specialty medications: Yes  Additional Specialty Medications: tacrolimus 1 MG capsule (PROGRAF)  Patient Reported Additional Medication X Missed Doses in the Last Month: 1  Adherence tools used: patient uses a pill box to manage medications  Support network for adherence: family member              Were doses missed due to medication being on hold? No    mycophenolate 180mg   : 2 days of medicine on hand   Tacrolimus 1mg   : 2 days of medicine on hand       REFERRAL TO PHARMACIST     Referral to the pharmacist: Yes - routine compliance concerns. Patient has missed 1-3 doses of medication. Refills were scheduled and concern routed to pharmacist for evaluation.   I spoke with patient, he uses pillbox but will consider alarm to aid in adherence, understand importance of adherence      SHIPPING     Shipping address confirmed in Epic.     Delivery Scheduled: Yes, Expected medication delivery date: 04/02/2021.     Medication will be delivered via UPS to the temporary address in Epic WAM.    Benjamin Maldonado   [facility] Specialty Pharmacist

## 2021-04-03 IMAGING — MR MR FOOT*L* W/O CM
4 of 5 series · 19 of 40 positions shown · non-contrast
Comparison: October 29, 2019 radiograph

CLINICAL DATA: Fifth metatarsal ulcer and possible osteomyelitis

EXAM:
MRI OF THE LEFT FOOT WITHOUT CONTRAST
TECHNIQUE: Multiplanar, multisequence MR imaging of the left was performed. No
intravenous contrast was administered.

[Series 6: T2 fat-sat · oblique · 3.0mm · 0.29mm/px · 9 of 39 slices shown (1 of 2)]
[im 1/39]
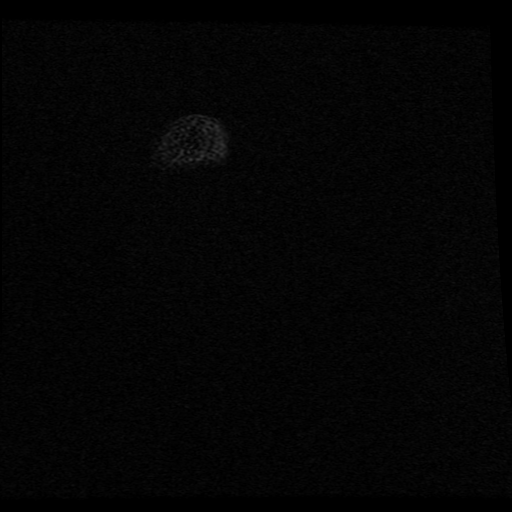
[im 5/39]
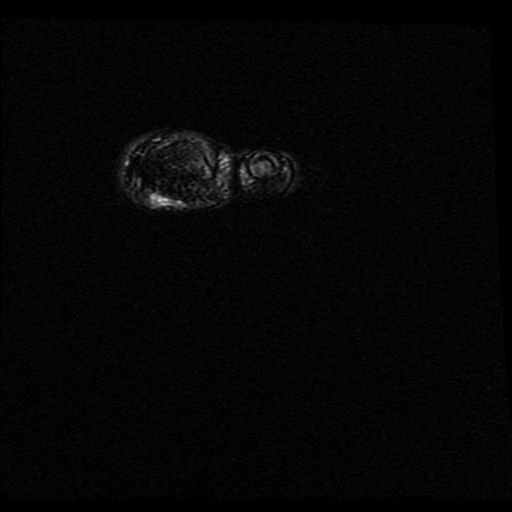
[im 10/39]
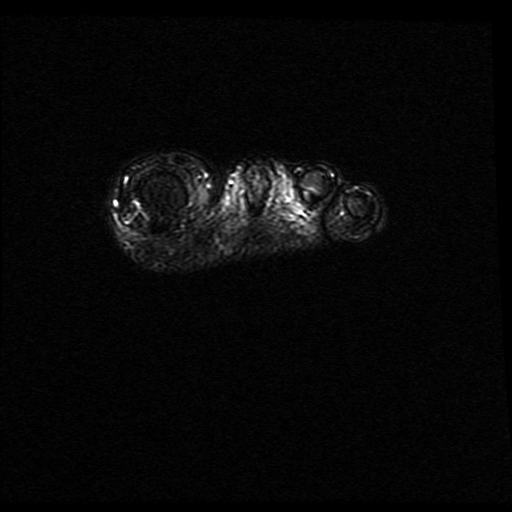
[im 15/39]
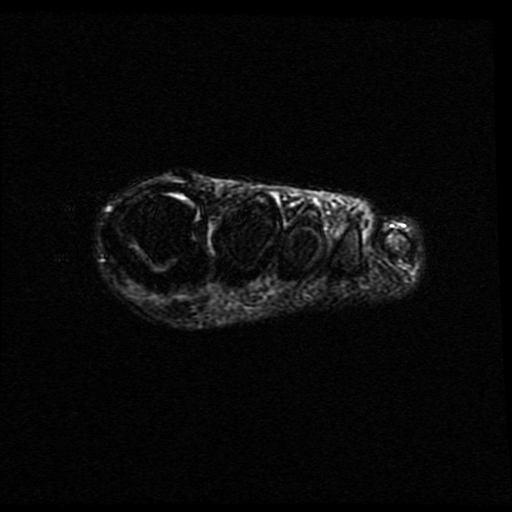
[im 20/39]
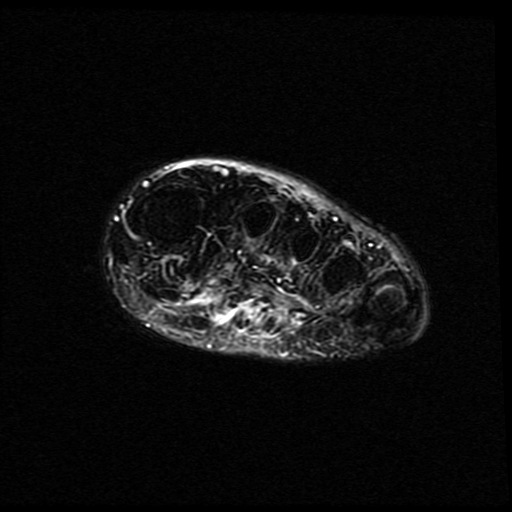
[im 24/39]
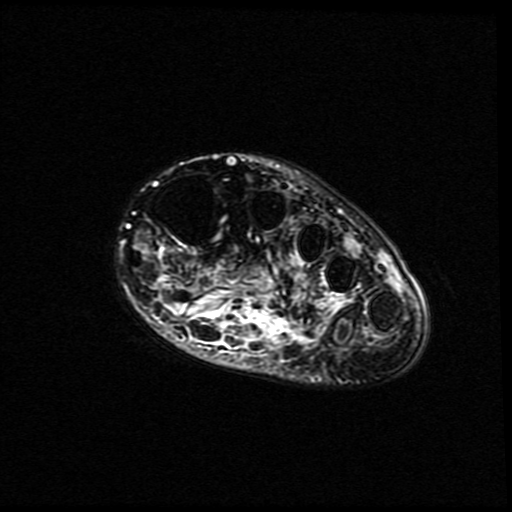
[im 29/39]
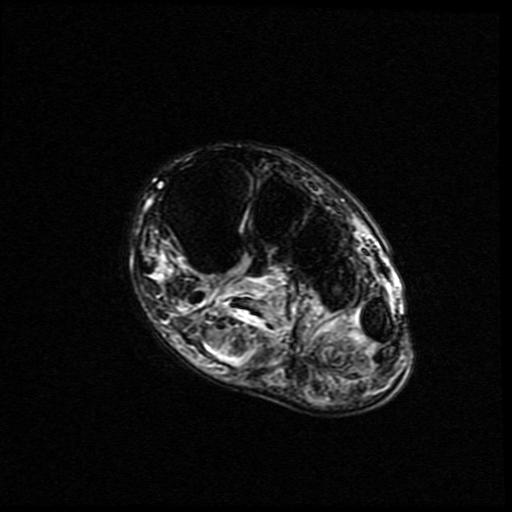
[im 34/39]
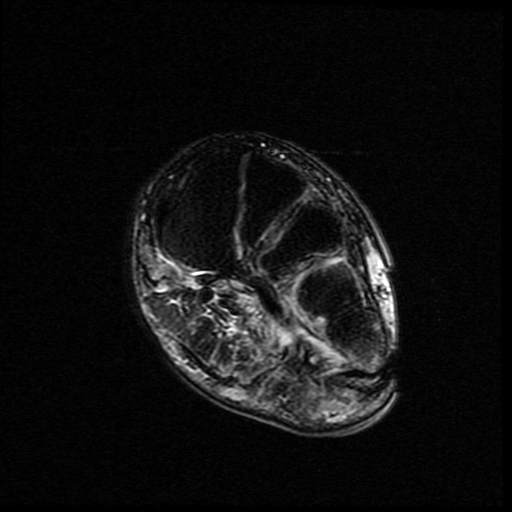
[im 39/39]
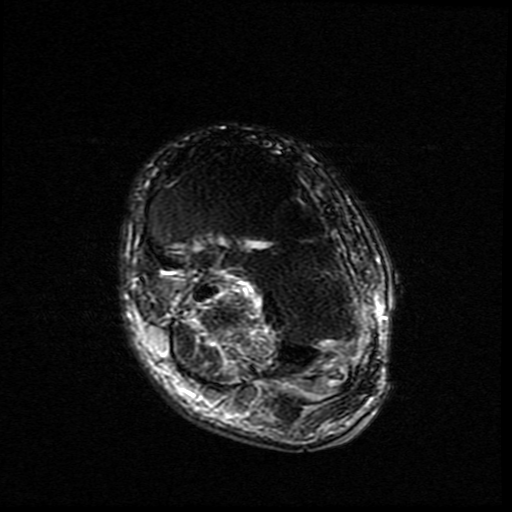

[Series 7: T1 · oblique · 3.0mm · 0.29mm/px · 3 of 39 slices shown (1 of 2)]
[im 5/39]
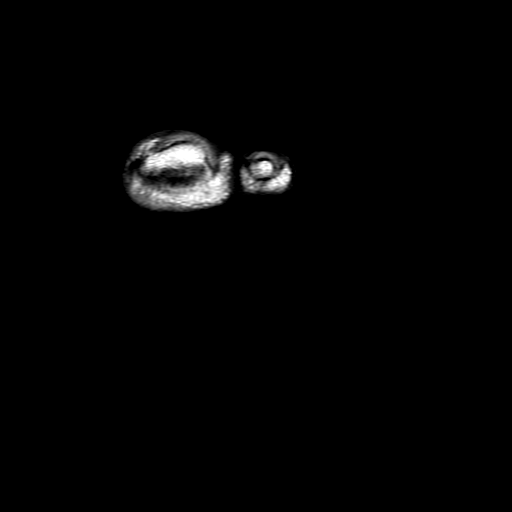
[im 20/39]
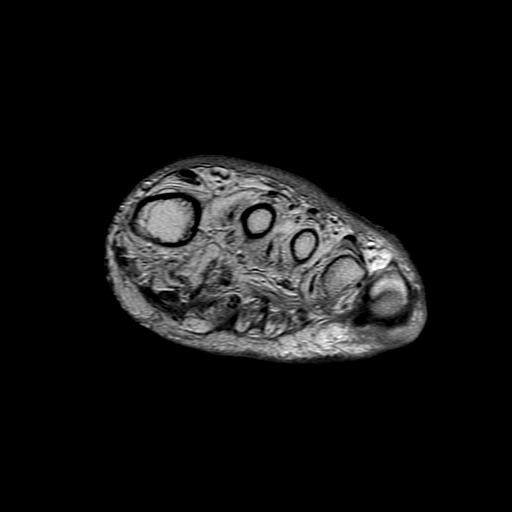
[im 34/39]
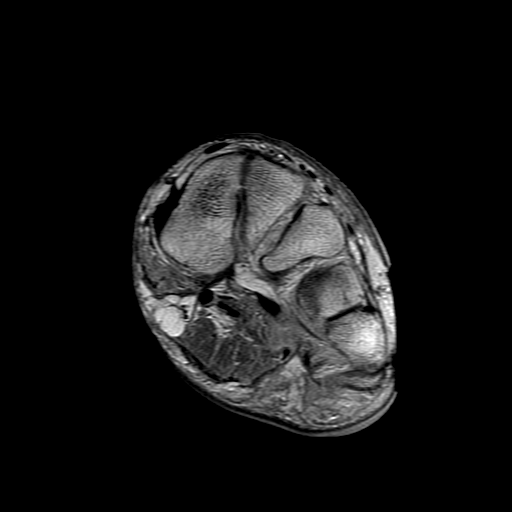

[Series 8: T1 · oblique · 3.0mm · 0.33mm/px · 3 of 36 slices shown (2 of 2)]
[im 6/36]
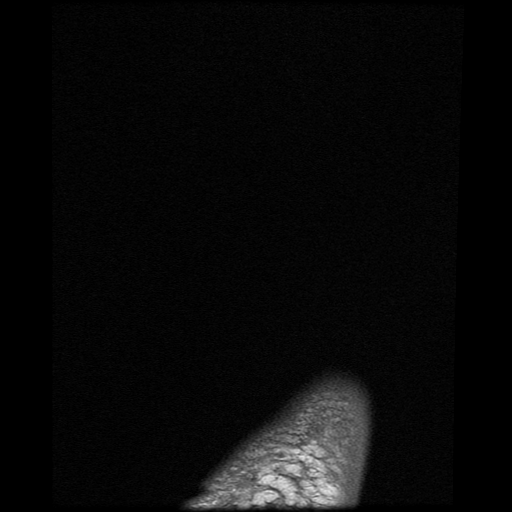
[im 21/36]
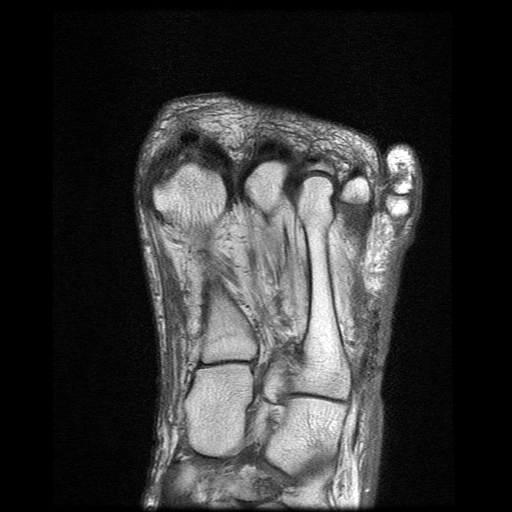
[im 31/36]
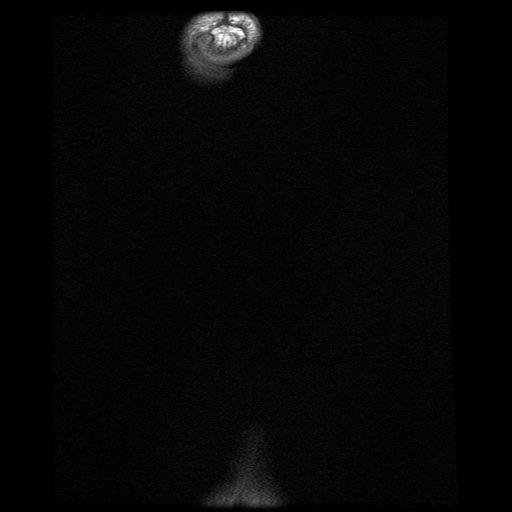

[Series 9: T2 fat-sat · oblique · 3.0mm · 0.33mm/px · 4 of 36 slices shown (2 of 2)]
[im 1/36]
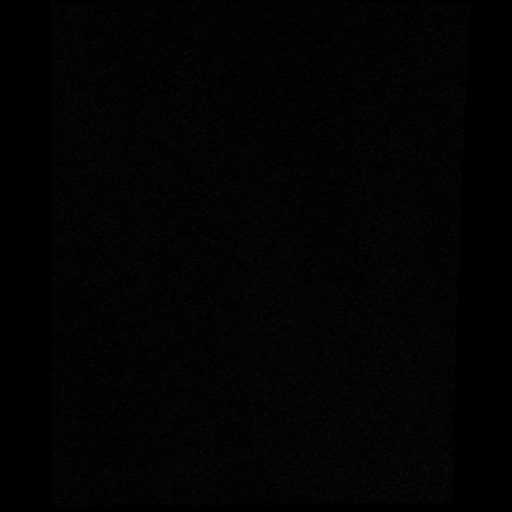
[im 6/36]
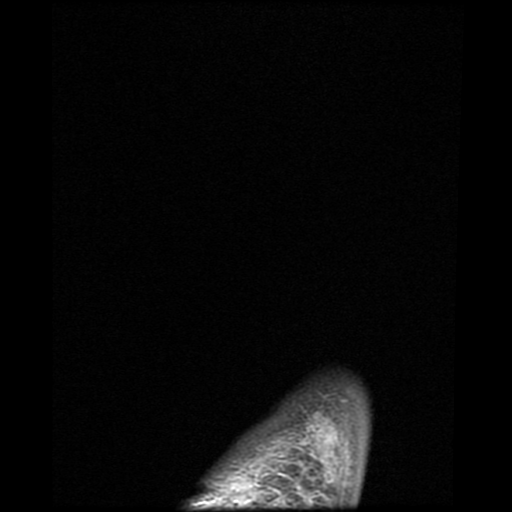
[im 21/36]
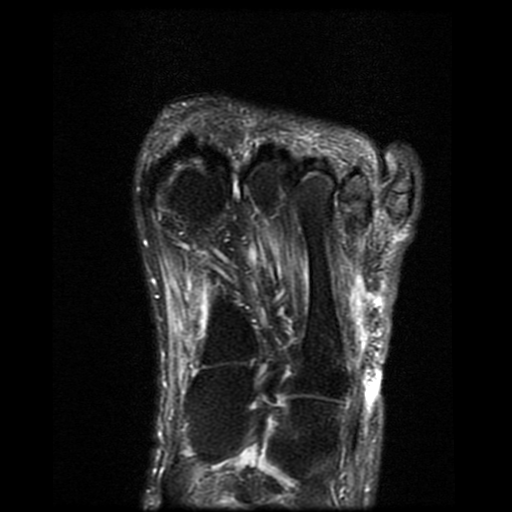
[im 31/36]
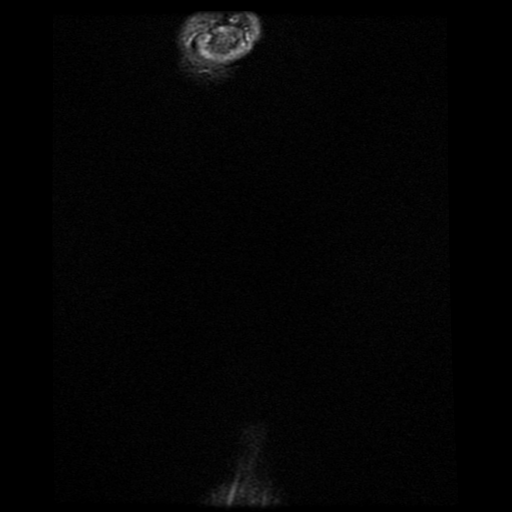

[19 of 40 positions shown; findings below may reference images not displayed]

FINDINGS: Bones/Joint/Cartilage

There is minimally increased T2 hyperintense signal seen at the
lateral base of the fifth metatarsal. There is possible subtle T1
hypointensity seen the plantar surface of the fifth metatarsal base,
series 7, image 5. No osseous fracture, cortical destruction or
periosteal reaction. There is an overlying soft tissue ulceration
with subcutaneous emphysema. Normal osseous marrow signal seen
throughout the remainder of the forefoot. The articular surfaces
appear to be maintained. No large joint effusions.

Ligaments

The Lisfranc ligaments are intact.

Muscles and Tendons

Mild fatty atrophy noted within the musculature with diffusely
increased feathery signal. The flexor and extensor tendons appear to
be intact.

Soft tissues

Area of a superficial ulceration seen overlying the lateral base of
the fifth metatarsal with subcutaneous emphysema. Overlying skin
thickening and subcutaneous edema is noted. No loculated fluid
collections are noted.
IMPRESSION: Findings which could be suggestive of early osteomyelitis of the
lateral plantar base of the fifth metatarsal. No osseous fracture.
Overlying superficial ulceration with subcutaneous emphysema and
diffuse edema. No soft tissue abscess or sinus tract.

## 2021-04-14 MED FILL — BASAGLAR KWIKPEN U-100 INSULIN 100 UNIT/ML (3 ML) SUBCUTANEOUS: SUBCUTANEOUS | 93 days supply | Qty: 15 | Fill #1

## 2021-05-02 DIAGNOSIS — R079 Chest pain, unspecified: Secondary | ICD-10-CM | POA: Insufficient documentation

## 2021-05-06 NOTE — Unmapped (Signed)
[facility] Specialty Pharmacy Refill Coordination Note    Specialty Medication(s) to be Shipped:   Transplant:  mycophenolic acid 180mg  and tacrolimus 1mg     Other medication(s) to be shipped:     metoprolol  Omeprazole  novolog      Benjamin Maldonado, DOB: 07-11-1975  Phone: [phone] (home)       All above HIPAA information was verified with patient.     Was a Nurse, learning disability used for this call? No    Completed refill call assessment today to schedule patient's medication shipment from the [facility] [phone]).  All relevant notes have been reviewed.     Specialty medication(s) and dose(s) confirmed: Regimen is correct and unchanged.   Changes to medications: [name] reports no changes at this time.  Changes to insurance: No  New side effects reported not previously addressed with a pharmacist or physician: None reported  Questions for the pharmacist: No    Confirmed patient received a Conservation officer, historic buildings and a Surveyor, mining with first shipment. The patient will receive a drug information handout for each medication shipped and additional FDA Medication Guides as required.       DISEASE/MEDICATION-SPECIFIC INFORMATION        N/A    SPECIALTY MEDICATION ADHERENCE     Medication Adherence    Patient reported X missed doses in the last month: 0  Specialty Medication: tacrolimus 1mg   Patient is on additional specialty medications: Yes  Additional Specialty Medications: mycophenolate 180 MG EC tablet (MYFORTIC)  Patient Reported Additional Medication X Missed Doses in the Last Month: 0  Patient is on more than two specialty medications: No  Adherence tools used: patient uses a pill box to manage medications  Support network for adherence: family member        mycophenolic acid 180mg   5 days worth of medication on hand.  tacrolimus 1mg   5 days worth of medication on hand.        Were doses missed due to medication being on hold? No      REFERRAL TO PHARMACIST     Referral to the pharmacist: Not needed      [facility]     Shipping address confirmed in Epic.     Delivery Scheduled: Yes, Expected medication delivery date: 05/08/21.     Medication will be delivered via UPS to the prescription address in Epic WAM.    [place] A Giuseppe Duchemin   [facility] Shared [facility] Specialty Technician

## 2021-05-07 MED FILL — OMEPRAZOLE 20 MG CAPSULE,DELAYED RELEASE: ORAL | 30 days supply | Qty: 30 | Fill #8

## 2021-05-07 MED FILL — TACROLIMUS 1 MG CAPSULE, IMMEDIATE-RELEASE: ORAL | 30 days supply | Qty: 360 | Fill #2

## 2021-05-07 MED FILL — METOPROLOL TARTRATE 50 MG TABLET: ORAL | 30 days supply | Qty: 90 | Fill #9

## 2021-05-07 MED FILL — MYCOPHENOLATE SODIUM 180 MG TABLET,DELAYED RELEASE: ORAL | 30 days supply | Qty: 180 | Fill #11

## 2021-05-07 MED FILL — NOVOLOG FLEXPEN U-100 INSULIN ASPART 100 UNIT/ML (3 ML) SUBCUTANEOUS: SUBCUTANEOUS | 55 days supply | Qty: 30 | Fill #2

## 2021-06-08 DIAGNOSIS — Z794 Long term (current) use of insulin: Principal | ICD-10-CM

## 2021-06-08 DIAGNOSIS — E1165 Type 2 diabetes mellitus with hyperglycemia: Principal | ICD-10-CM

## 2021-06-08 DIAGNOSIS — Z940 Kidney transplant status: Principal | ICD-10-CM

## 2021-06-08 DIAGNOSIS — N186 End stage renal disease: Principal | ICD-10-CM

## 2021-06-08 NOTE — Unmapped (Signed)
Patient called to see if we can get his RUS and chest xray at the hospital closer to his house due to transportation issues.    Called [facility] in [place], [place] and they state they can do RUS. Order faxed to [phone]    Called and updated patient who will call once he has apt scheduled. Then I will cancel [facility].    Denies any other needs

## 2021-06-10 DIAGNOSIS — Z940 Kidney transplant status: Principal | ICD-10-CM

## 2021-06-10 MED ORDER — MYCOPHENOLATE SODIUM 180 MG TABLET,DELAYED RELEASE
ORAL_TABLET | Freq: Two times a day (BID) | ORAL | 11 refills | 30.00000 days | Status: CN
Start: 2021-06-10 — End: 2022-06-10

## 2021-06-10 NOTE — Unmapped (Signed)
[facility] Specialty Pharmacy Refill Coordination Note    Specialty Medication(s) to be Shipped:   Transplant:  mycophenolic acid 180mg  and tacrolimus 1mg     Other medication(s) to be shipped: metoprolol, novolog, omeprazole     Benjamin Maldonado, DOB: 1976/03/10  Phone: [phone] (home)       All above HIPAA information was verified with patient.     Was a Nurse, learning disability used for this call? No    Completed refill call assessment today to schedule patient's medication shipment from the [facility] [phone]).  All relevant notes have been reviewed.     Specialty medication(s) and dose(s) confirmed: Regimen is correct and unchanged.   Changes to medications: Yamin reports no changes at this time.  Changes to insurance: No  New side effects reported not previously addressed with a pharmacist or physician: None reported  Questions for the pharmacist: No    Confirmed patient received a Conservation officer, historic buildings and a Surveyor, mining with first shipment. The patient will receive a drug information handout for each medication shipped and additional FDA Medication Guides as required.       DISEASE/MEDICATION-SPECIFIC INFORMATION        N/A    SPECIALTY MEDICATION ADHERENCE     Medication Adherence    Patient reported X missed doses in the last month: 0  Specialty Medication: Tacrolimus 1mg   Patient is on additional specialty medications: Yes  Additional Specialty Medications: Mycophenolate 180mg   Patient Reported Additional Medication X Missed Doses in the Last Month: 0  Patient is on more than two specialty medications: No  Adherence tools used: patient uses a pill box to manage medications  Support network for adherence: family member              Were doses missed due to medication being on hold? No    Tacrolimus 1 mg: 13 days of medicine on hand   Mycophenolate 180 mg: 13 days of medicine on hand     REFERRAL TO PHARMACIST     Referral to the pharmacist: Not needed      SHIPPING     Shipping address confirmed in Epic.     Delivery Scheduled: Yes, Expected medication delivery date: 06/18/21.     Medication will be delivered via UPS to the prescription address in Epic WAM.    Tera Helper   [facility] Specialty Pharmacist

## 2021-06-17 DIAGNOSIS — Z940 Kidney transplant status: Principal | ICD-10-CM

## 2021-06-17 MED ORDER — MYCOPHENOLATE SODIUM 180 MG TABLET,DELAYED RELEASE
ORAL_TABLET | Freq: Two times a day (BID) | ORAL | 11 refills | 30 days | Status: CP
Start: 2021-06-17 — End: 2022-06-17
  Filled 2021-06-18: qty 180, 30d supply, fill #0

## 2021-06-17 NOTE — Unmapped (Signed)
Benjamin Maldonado 's entire shipment will be delayed as a result of no refills remain on the prescription (Mycophenolate).      I have reached out to the patient  at [phone] and communicated the delivery change pending receipt of new Rx. We will reschedule the medication for the delivery date that the patient agreed upon.  We have confirmed the delivery date as 06/19/2021, via ups.

## 2021-06-18 MED FILL — METOPROLOL TARTRATE 50 MG TABLET: ORAL | 30 days supply | Qty: 90 | Fill #10

## 2021-06-18 MED FILL — TACROLIMUS 1 MG CAPSULE, IMMEDIATE-RELEASE: ORAL | 30 days supply | Qty: 360 | Fill #3

## 2021-06-18 MED FILL — OMEPRAZOLE 20 MG CAPSULE,DELAYED RELEASE: ORAL | 30 days supply | Qty: 30 | Fill #9

## 2021-06-22 MED FILL — NOVOLOG FLEXPEN U-100 INSULIN ASPART 100 UNIT/ML (3 ML) SUBCUTANEOUS: SUBCUTANEOUS | 55 days supply | Qty: 30 | Fill #3

## 2021-06-25 NOTE — Unmapped (Signed)
Patient able to get rus chest locally. Canceled [facility]

## 2021-07-13 NOTE — Unmapped (Addendum)
[facility] Shared [facility] Specialty Pharmacy Clinical Assessment & Refill Coordination Note    Benjamin Maldonado, DOB: 07-Oct-1975  Phone: [phone] (home)     All above HIPAA information was verified with patient.     Was a Nurse, learning disability used for this call? 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 acetaminophen (TYLENOL) 325 MG tablet Take 2 tablets (650 mg total) by mouth every six (6) hours as needed for pain. (Patient not taking: Reported on 05/09/2019) 100 tablet 2   ??? aspirin (ADULT LOW DOSE ASPIRIN) 81 MG tablet Take 1 tablet (81 mg total) by mouth daily. 30 tablet 11   ??? atorvastatin (LIPITOR) 20 MG tablet Take 1 tablet (20 mg total) by mouth daily. 90 tablet 3   ??? blood sugar diagnostic Strp by Other route Four (4) times a day (before meals and nightly). One Touch 100 strip 5   ??? blood-glucose meter (GLUCOSE MONITORING KIT) kit Use as instructed 1 each 0   ??? cephalexin (KEFLEX) 500 MG capsule Take 1 capsule (500 mg total) by mouth Three (3) times a day for 7 days 21 capsule 0   ??? cephalexin (KEFLEX) 500 MG capsule Take 1 capsule (500 mg total) by mouth two (2) times a day for 7 days 14 capsule 0   ??? dorzolamide-timoloL (COSOPT) 22.3-6.8 mg/mL ophthalmic solution INSTILL 1 DROP INTO LEFT EYE TWICE DAILY     ??? GLUC.METER,DIS.P-LOADED STRIPS (GLUCOSE METER, DISP & STRIPS MISC) Frequency:PHARMDIR   Dosage:0.0     Instructions:  Note:250.00, life long Dose: 1     ??? insulin ASPART (NOVOLOG FLEXPEN U-100 INSULIN) 100 unit/mL (3 mL) injection pen Inject 4 units under skin with breakfast, 8 units with lunch, and 6 units with supper and sliding scale. Max units 54 per day. 30 mL 11   ??? insulin glargine (LANTUS SOLOSTAR U-100 INSULIN) 100 unit/mL (3 mL) injection pen Inject 0.16 mL (16 Units total) under the skin nightly. 15 mL 5   ??? magnesium oxide (MAG-OX) 400 mg (241.3 mg magnesium) tablet Take 400 mg by mouth.     ??? metoprolol tartrate (LOPRESSOR) 25 MG tablet Take 0.5 tablets (12.5 mg total) by mouth two (2) times a day. 30 tablet 1   ??? metoprolol tartrate (LOPRESSOR) 50 MG tablet Take 1 and 1/2 tablets (75 mg total) by mouth Two (2) times a day. 90 tablet 11   ??? mycophenolate (MYFORTIC) 180 MG EC tablet Take 3 tablets (540 mg total) by mouth two (2) times a day. 180 tablet 11   ??? omeprazole (PRILOSEC) 20 MG capsule Take 1 capsule (20 mg total) by mouth daily. 30 capsule 11   ??? silver sulfaDIAZINE (SILVADENE, SSD) 1 % cream Apply pea-sized amount to wound daily.     ??? tacrolimus (PROGRAF) 1 MG capsule Take 6 capsules (6 mg total) by mouth two (2) times a day. 360 capsule 11   ??? tadalafiL 20 MG tablet Take 20 mg by mouth every other day.       No current facility-administered medications for this visit.        Changes to medications: [name] reports no changes at this time.    Allergies   Allergen Reactions   ??? Iodine Other (See Comments)   ??? Iodine And Iodide Containing Products    ??? Shellfish Containing Products Other (See Comments)  Changes to allergies: No    SPECIALTY MEDICATION ADHERENCE     Tacrolimus 1mg   : 7 days of medicine on hand   Mycophenolate 180mg   : 7 days of medicine on hand       Medication Adherence    Patient reported X missed doses in the last month: 0  Specialty Medication: tacrolimus 1mg   Patient is on additional specialty medications: Yes  Additional Specialty Medications: Mycophenolate 180mg   Patient Reported Additional Medication X Missed Doses in the Last Month: 0  Adherence tools used: patient uses a pill box to manage medications  Support network for adherence: family member          Specialty medication(s) dose(s) confirmed: Regimen is correct and unchanged.     Are there any concerns with adherence? No    Adherence counseling provided? Not needed    CLINICAL MANAGEMENT AND INTERVENTION      Clinical Benefit Assessment:    Do you feel the medicine is effective or helping your condition? Yes    Clinical Benefit counseling provided? Not needed    Adverse Effects Assessment:    Are you experiencing any side effects? No    Are you experiencing difficulty administering your medicine? 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 For example, brushing your teeth or getting up in the morning. 0    Have you discussed this with your provider? Not needed    Acute Infection Status:    Acute infections noted within Epic:  No active infections  Patient reported infection: None    Therapy Appropriateness:    Is therapy appropriate and patient progressing towards therapeutic goals? Yes, therapy is appropriate and should be continued    DISEASE/MEDICATION-SPECIFIC INFORMATION      N/A    PATIENT SPECIFIC NEEDS     - Does the patient have any physical, cognitive, or cultural barriers? No    - Is the patient high risk? Yes, patient is taking a REMS drug. Medication is dispensed in compliance with REMS program    - Does the patient require a Care Management Plan? No       SHIPPING     Specialty Medication(s) to be Shipped:   Transplant:  mycophenolic acid 180mg  and tacrolimus 1\mg    Other medication(s) to be shipped: metoprolol, omeprazole, basaglar     Changes to insurance: No    Delivery Scheduled: Yes, Expected medication delivery date: 07/16/2021.     Medication will be delivered via UPS to the confirmed temporary address in [facility]. Verified temporary address for this delivery.    The patient will receive a drug information handout for each medication shipped and additional FDA Medication Guides as required.  Verified that patient has previously received a Conservation officer, historic buildings and a Surveyor, mining.    The patient or caregiver noted above participated in the development of this care plan and knows that they can request review of or adjustments to the care plan at any time.      All of the patient's questions and concerns have been addressed.    [name]   [facility] Specialty Pharmacist

## 2021-07-15 ENCOUNTER — Ambulatory Visit: Admit: 2021-07-15 | Discharge: 2021-07-15 | Payer: MEDICARE

## 2021-07-15 ENCOUNTER — Ambulatory Visit: Admit: 2021-07-15 | Discharge: 2021-07-15 | Payer: MEDICARE | Attending: Nephrology | Primary: Nephrology

## 2021-07-15 DIAGNOSIS — Z940 Kidney transplant status: Principal | ICD-10-CM

## 2021-07-15 DIAGNOSIS — D849 Immunodeficiency, unspecified: Principal | ICD-10-CM

## 2021-07-15 DIAGNOSIS — I2510 Atherosclerotic heart disease of native coronary artery without angina pectoris: Principal | ICD-10-CM

## 2021-07-15 DIAGNOSIS — Z794 Long term (current) use of insulin: Principal | ICD-10-CM

## 2021-07-15 DIAGNOSIS — N186 End stage renal disease: Principal | ICD-10-CM

## 2021-07-15 DIAGNOSIS — I998 Other disorder of circulatory system: Principal | ICD-10-CM

## 2021-07-15 DIAGNOSIS — Z48298 Encounter for aftercare following other organ transplant: Principal | ICD-10-CM

## 2021-07-15 DIAGNOSIS — E1165 Type 2 diabetes mellitus with hyperglycemia: Principal | ICD-10-CM

## 2021-07-15 DIAGNOSIS — T8611 Kidney transplant rejection: Principal | ICD-10-CM

## 2021-07-15 DIAGNOSIS — T8789 Other complications of amputation stump: Principal | ICD-10-CM

## 2021-07-15 DIAGNOSIS — I119 Hypertensive heart disease without heart failure: Principal | ICD-10-CM

## 2021-07-15 LAB — LIPID PANEL
CHOLESTEROL/HDL RATIO SCREEN: 3.2 (ref 1.0–4.5)
CHOLESTEROL: 185 mg/dL (ref <=200)
HDL CHOLESTEROL: 57 mg/dL (ref 40–60)
LDL CHOLESTEROL CALCULATED: 113 mg/dL — ABNORMAL HIGH (ref 40–99)
NON-HDL CHOLESTEROL: 128 mg/dL (ref 70–130)
TRIGLYCERIDES: 73 mg/dL (ref 0–150)
VLDL CHOLESTEROL CAL: 14.6 mg/dL (ref 11–50)

## 2021-07-15 LAB — URINALYSIS WITH MICROSCOPY
BILIRUBIN UA: NEGATIVE
GLUCOSE UA: 250 — AB
KETONES UA: NEGATIVE
LEUKOCYTE ESTERASE UA: NEGATIVE
NITRITE UA: NEGATIVE
PH UA: 6 (ref 5.0–9.0)
PROTEIN UA: 100 — AB
RBC UA: 5 /HPF — ABNORMAL HIGH (ref <3)
SPECIFIC GRAVITY UA: 1.025 (ref 1.005–1.030)
SQUAMOUS EPITHELIAL: 1 /HPF (ref 0–5)
UROBILINOGEN UA: 0.2
WBC UA: 1 /HPF (ref <2)

## 2021-07-15 LAB — HEPATIC FUNCTION PANEL
ALBUMIN: 4.4 g/dL (ref 3.4–5.0)
ALKALINE PHOSPHATASE: 93 U/L (ref 46–116)
ALT (SGPT): 12 U/L (ref 10–49)
AST (SGOT): 16 U/L (ref <=34)
BILIRUBIN DIRECT: 0.2 mg/dL (ref 0.00–0.30)
BILIRUBIN TOTAL: 0.7 mg/dL (ref 0.3–1.2)
PROTEIN TOTAL: 7.5 g/dL (ref 5.7–8.2)

## 2021-07-15 LAB — BASIC METABOLIC PANEL
ANION GAP: 7 mmol/L (ref 5–14)
BLOOD UREA NITROGEN: 24 mg/dL — ABNORMAL HIGH (ref 9–23)
BUN / CREAT RATIO: 18
CALCIUM: 10.7 mg/dL — ABNORMAL HIGH (ref 8.7–10.4)
CHLORIDE: 107 mmol/L (ref 98–107)
CO2: 24.8 mmol/L (ref 20.0–31.0)
CREATININE: 1.32 mg/dL — ABNORMAL HIGH
EGFR CKD-EPI (2021) MALE: 68 mL/min/{1.73_m2} (ref >=60)
GLUCOSE RANDOM: 223 mg/dL — ABNORMAL HIGH (ref 70–99)
POTASSIUM: 4.5 mmol/L (ref 3.4–4.8)
SODIUM: 139 mmol/L (ref 135–145)

## 2021-07-15 LAB — PROTEIN / CREATININE RATIO, URINE
CREATININE, URINE: 109 mg/dL
PROTEIN URINE: 104.6 mg/dL
PROTEIN/CREAT RATIO, URINE: 0.96

## 2021-07-15 LAB — MAGNESIUM: MAGNESIUM: 1.5 mg/dL — ABNORMAL LOW (ref 1.6–2.6)

## 2021-07-15 LAB — PHOSPHORUS: PHOSPHORUS: 3.4 mg/dL (ref 2.4–5.1)

## 2021-07-15 LAB — PARATHYROID HORMONE (PTH): PARATHYROID HORMONE INTACT: 129.5 pg/mL — ABNORMAL HIGH (ref 18.4–80.1)

## 2021-07-15 MED ORDER — ATORVASTATIN 20 MG TABLET
ORAL_TABLET | Freq: Every day | ORAL | 3 refills | 90 days | Status: CP
Start: 2021-07-15 — End: 2022-07-15
  Filled 2021-08-13: qty 90, 90d supply, fill #0

## 2021-07-15 MED ORDER — DORZOLAMIDE 22.3 MG-TIMOLOL 6.8 MG/ML EYE DROPS
Freq: Two times a day (BID) | OPHTHALMIC | 12 refills | 100 days | Status: CP
Start: 2021-07-15 — End: ?
  Filled 2021-08-21: qty 10, 100d supply, fill #0

## 2021-07-15 MED FILL — BASAGLAR KWIKPEN U-100 INSULIN 100 UNIT/ML (3 ML) SUBCUTANEOUS: SUBCUTANEOUS | 93 days supply | Qty: 15 | Fill #2

## 2021-07-15 MED FILL — MYCOPHENOLATE SODIUM 180 MG TABLET,DELAYED RELEASE: ORAL | 30 days supply | Qty: 180 | Fill #1

## 2021-07-15 MED FILL — OMEPRAZOLE 20 MG CAPSULE,DELAYED RELEASE: ORAL | 30 days supply | Qty: 30 | Fill #10

## 2021-07-15 MED FILL — TACROLIMUS 1 MG CAPSULE, IMMEDIATE-RELEASE: ORAL | 30 days supply | Qty: 360 | Fill #4

## 2021-07-15 MED FILL — METOPROLOL TARTRATE 50 MG TABLET: ORAL | 30 days supply | Qty: 90 | Fill #11

## 2021-07-15 NOTE — Unmapped (Signed)
Saw patient and sister in clinic.    Admitted a month and a half ago and had a heart cath which was normal. No CP since admission. 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     PT twice a week and nursing  once a week for wound care on left leg    BS better. High 297 and low 69 (symptomatic).     BP: not checking    NP coming to the house now every 6 weeks.    Drinking about 70 ounces    Appetite: good    Sleeping: varies    Not wearing CPAP. Needs new one and will talk to NP.    +RLE occasionally    Denies HA, dizziness, CP, heart palpitations, abdominal pain, n/v/d, urinary problems, neuropathy,  tremors, or fevers.    Will get labs today and took tac at 915pm

## 2021-07-15 NOTE — Unmapped (Signed)
Transplant Nephrology Clinic Visit    Assessment    Benjamin Maldonado is a 46 y.o. 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 Active medical issues include:    Status post kidney transplant with no history of rejection or diabetic nephropathy recurrence  - Serum creatinine is stable at 1.32 mg/dL  (baseline <1.6 mg/dL).   - No history of rejection or proteinuria or donor specific antibodies (last DSA negative 05/09/19)   - UPC today 0.112  - UA without hematuria or pyuria.    Immunosuppression management.   - Tacrolimus level is pending and will be a >17 hour trough level (target 12 hour trough is 5-8).  - Continue tacrolimus 6 mg bid and Myfortic 540 mg twice daily.    Bilateral pleural effusions  - Hospitalized in 12/2019 and 02/2020 with dyspnea and pleural effusion  - thoracentesis fluid analysis exudative with negative cytology and cultures  - s/p left pleurodesis 03/05/20  - CXR 05/01/21 revealed no pleural effusions    PAD, s/p left BKA.  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    - BP 173/85 today. Home BP 160's/70's   - Renal transplant [place] (10/31/18) showed the anastomotic velocity to be improved compared to past studies. I do not feel further imaging is necessary in the absence of HTN or allograft dysfunction.   - Continue current antihypertensive regimen.    Diabetes mellitus, type 1.   - A1C today was clotted so no value obtained  - Follow up with Dr. Wylene Simmer      Hyperlipidemia.   - Continue Lipitor    Coronary artery disease.   - Denies symptoms of cardiac ischemia since 04/2021 admission  - Cardiac cath 05/04/21 with all lesions <50%  - Continue beta blocker therapy, aspirin, statin therapyestive of cardiac ischemia.   - Follow up with local cardiologist    Cerebrovascular disease  - Head CT 2021 with old left frontal lobe infarct,     COVID-19 infection with RLL pneumonia 11/16/19  - S/P Regeneron monoclonal Ab therapy 11/20/19  - Has recovered well  - COVID-19 vaccination eligible 02/20/20 and he has completed 1 dose of vaccine with plans to complete 3 dose primary series.    Immunizations/Infection prevention.   - Prevnar 13 (03/23/17).   - Flu vaccine recommended and now due  - Pneumovax 05/09/19  - COVID-19 vaccine series never completed. Will give bivalent COVID vaccine today in clinic.      Follow-up.   Transplant clinic in 6 months  Shared follow up with Dr. Signe Colt in [place].   Vascular surgery, podiatry, and wound follow up  Local Cardiology follow up.    History of Present Illness    Benjamin Maldonado is a 46 y.o. 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  He has no history of rejection or DSAs with baseline creatinine of 1.3-1.8 mg/dL. He is seen today for annual follow up and was last seen in transplant clinic 05/10/20.      Hospitalizations over the past 18 months include:  1) 11/16/19-12/18/19 [facility] with cough, fever, tachycardia, RLL pneumonia and was found to have bacteremia (group B strep) and COVID-19 PCR positivity. Treated with IV antibiotics and Regeneron monoclonal Ab therapy. Found to have bilateral foot ulcers and evidence of osteomyelitis (culture with proteus and strep agalactiae). He underwent left BKA and debridement of right foot wounds on 11/21/19 treated with prolonged course of Unasyn then Augmentin.     2) 01/09/20-01/11/20- [facility] with large left pleural effusion. Thoracentesis with exudative effusion.   3) 02/21/20-03/09/20 - [facility] with dyspnea and bilateral pleural effusions (large left, moderate right) requiring multiple thoracenteses and left pleurodesis with fluid consistent with exudate with negative cytology for malignancy and negative cultures. 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     4) 10/04/20-10/13/20 [facility] with nausea, vomiting, diarrhea, glucose 850, left stump drainage with cultures positive for MSSA. Underwent debridement with no evidence of osteomyelitis at the stump site. Left chest wall dermal abscess was also drained.     5) 05/02/21-05/05/21 [facility] for chest pain. Cardiac cath 05/04/21 with 20% mid RCA, 20% mid LAD, 30% mid-LAD 2, 50% first diagonal. Echo with EF 55-60%, borderline LVH. Recommended medical management including statin, aspirin, prn NTG.     His most recent serum creatinine was 1.48 mg/dL on 1/61/09 on the day of hospital discharge. He denies dysuria, allograft tenderness, headaches, hand tremors, chest pain or shortness of breath. He has mild edema in the right leg. The stump wound site is healing and he receives home wound care and home PT. He is ambulatory with his left leg prosthesis. He denies nausea, vomiting or diarrhea.     He is now living in [place], [place].  He has not been wearing CPAP. Home BP has been 160's systolic during home nurse visits. Blood glucose control has improved with treatment of infections. He has never received a COVID vaccine.     Immunosuppression is unchanged. His last dose of tac was at 9:15 PM    Kidney Transplant History     1. 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 Deceased donor kidney transplant 11/08/2014.  DCD kidney donor with KDPI 43%, CMV D-R-, EBV D+R+  3. Donor with asphyxiation and serratia isolated on donor tracheal aspirate and donor blood cultures with S. Epidermidis bacteremia. Patient treated with Vancomycin for 2 weeks.  4. Delayed graft function with dialysis due to hyperkalemia  5. Induction agent = campath, solumedrol with rapid steroid discontinuation  6. Maintenance immunosuppression = tacrolimus, mycophenolate sodium  7. Stent removed 12/11/2014.  8. Kidney biopsy 11/12/2015 due to acute allograft dysfunction with focal mild ATN and no other pathology.  9. 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     Other Medical History      1. Type 2 diabetes mellitus, onset 1991.   2. Hypertension of several years duration.  3. Coronary artery disease, s/p PCI to the RCA in 2009, s/p DES to LAD 04/30/2011, s/p cardiac cath 05/04/21 with 20% mid RCA, 20% mid LAD, 30% mid-LAD 2, 50% first diagonal.  4. Left ventricular hypertrophy with diastolic dysfunction noted on echocardiogram 09/2014 and mild LVH 04/2021.  5. Hyperlipidemia  6. Diabetic retinopathy with right eye blindness  7. Diabetic peripheral neuropathy  8. Frozen left shoulder  9. Burn right foot 08/2017  10. COVID-19 10/2019  11. PAD, s/p left BKA 11/2019  12. Bilateral  exudative pleural effusions, negative for infection or malignancy, s/p pleurodesis 03/05/20     Review of Systems    Otherwise as per HPI, all other systems reviewed and are negative.    Medications  Current Outpatient Medications   Medication Sig Dispense Refill   ??? acetaminophen (TYLENOL) 325 MG tablet Take 2 tablets (650 mg total) by mouth every six (6) hours as needed for pain. (Patient not taking: Reported on 05/09/2019) 100 tablet 2   ??? aspirin (ADULT LOW DOSE ASPIRIN) 81 MG tablet Take 1 tablet (81 mg total) by mouth daily. 30 tablet 11   ??? atorvastatin (LIPITOR) 20 MG tablet Take 1 tablet (20 mg total) by mouth daily. 90 tablet 3   ??? blood sugar diagnostic Strp by Other route Four (4) times a day (before meals and nightly). One Touch 100 strip 5   ??? blood-glucose meter (GLUCOSE MONITORING KIT) kit Use as instructed 1 each 0   ??? cephalexin (KEFLEX) 500 MG capsule Take 1 capsule (500 mg total) by mouth Three (3) times a day for 7 days 21 capsule 0   ??? cephalexin (KEFLEX) 500 MG capsule Take 1 capsule (500 mg total) by mouth two (2) times a day for 7 days 14 capsule 0   ??? dorzolamide-timoloL (COSOPT) 22.3-6.8 mg/mL ophthalmic solution INSTILL 1 DROP INTO LEFT EYE TWICE DAILY     ??? GLUC.METER,DIS.P-LOADED STRIPS (GLUCOSE METER, DISP & STRIPS MISC) Frequency:PHARMDIR   Dosage:0.0     Instructions:  Note:250.00, life long Dose: 1     ??? insulin ASPART (NOVOLOG FLEXPEN U-100 INSULIN) 100 unit/mL (3 mL) injection pen Inject 4 units under skin with breakfast, 8 units with lunch, and 6 units with supper and sliding scale. Max units 54 per day. 30 mL 11   ??? insulin glargine (LANTUS SOLOSTAR U-100 INSULIN) 100 unit/mL (3 mL) injection pen Inject 0.16 mL (16 Units total) under the skin nightly. 15 mL 5   ??? magnesium oxide (MAG-OX) 400 mg (241.3 mg magnesium) tablet Take 400 mg by mouth.     ??? metoprolol tartrate (LOPRESSOR) 25 MG tablet Take 0.5 tablets (12.5 mg total) by mouth two (2) times a day. 30 tablet 1   ??? metoprolol tartrate (LOPRESSOR) 50 MG tablet Take 1 and 1/2 tablets (75 mg total) by mouth Two (2) times a day. 90 tablet 11   ??? mycophenolate (MYFORTIC) 180 MG EC tablet Take 3 tablets (540 mg total) by mouth two (2) times a day. 180 tablet 11   ??? omeprazole (PRILOSEC) 20 MG capsule Take 1 capsule (20 mg total) by mouth daily. 30 capsule 11   ??? silver sulfaDIAZINE (SILVADENE, SSD) 1 % cream Apply pea-sized amount to wound daily.     ??? tacrolimus (PROGRAF) 1 MG capsule Take 6 capsules (6 mg total) by mouth two (2) times a day. 360 capsule 11   ??? tadalafiL 20 MG tablet Take 20 mg by mouth every other day.       No current facility-administered medications for this visit.       Physical Exam  BP 173/85 (BP Site: R Arm, BP Position: Sitting, BP Cuff Size: Medium) Comment: Aobp Average - Pulse 64  - Temp 35.9 ??C (96.6 ??F) (Temporal)  - Ht 175.3 cm (5' 9)  - Wt (!) 108 kg (238 lb)  - BMI 35.15 kg/m??   General: Patient is a pleasant male in no apparent distress.  Eyes: Sclera anicteric.  Neck: Supple without LAD/JVD/bruits.  Lungs: Clear to auscultation  bilaterally, no wheezes/rales/rhonchi.  Cardiovascular:  grade 1/6 systolic murmur .  Abdomen: Soft, notender/nondistended. Positive bowel sounds. No hepatosplenomegaly, masses or bruits appreciated.  Extremities:  left BKA with prosthesis, right LE with 1+ edema  Skin:  stump wound site not examined today  Neurological: Grossly nonfocal.  Psychiatric: Mood and affect appropriate.     Laboratory Data and Imaging Reviewed in EMR

## 2021-07-15 NOTE — Unmapped (Signed)
AOBP:Right arm  medium cuff   Average:173/85  Pulse:64  1st reading:177/87  Pulse:64  2nd reading:173/83  Pulse:62  3rd reading:170/85  Pulse:65

## 2021-07-16 LAB — TACROLIMUS LEVEL, TROUGH: TACROLIMUS, TROUGH: 5.1 ng/mL (ref 5.0–15.0)

## 2021-07-17 LAB — CMV DNA, QUANTITATIVE, PCR: CMV VIRAL LD: NOT DETECTED

## 2021-07-21 LAB — BK VIRUS QUANTITATIVE PCR, BLOOD: BK BLOOD RESULT: NOT DETECTED

## 2021-07-21 LAB — VITAMIN D 1,25 DIHYDROXY: VITAMIN D 1,25-DIHYDROXY: 34 pg/mL

## 2021-07-21 LAB — VITAMIN D 25 HYDROXY: VITAMIN D, TOTAL (25OH): 9.2 ng/mL — ABNORMAL LOW (ref 20.0–80.0)

## 2021-07-22 LAB — HLA DS POST TRANSPLANT
ANTI-DONOR DRW #1 MFI: 25 MFI
ANTI-DONOR HLA-A #1 MFI: 48 MFI
ANTI-DONOR HLA-A #2 MFI: 88 MFI
ANTI-DONOR HLA-B #1 MFI: 28 MFI
ANTI-DONOR HLA-B #2 MFI: 64 MFI
ANTI-DONOR HLA-C #1 MFI: 29 MFI
ANTI-DONOR HLA-C #2 MFI: 77 MFI
ANTI-DONOR HLA-DQB #1 MFI: 6 MFI
ANTI-DONOR HLA-DQB #2 MFI: 10 MFI
ANTI-DONOR HLA-DR #1 MFI: 13 MFI
ANTI-DONOR HLA-DR #2 MFI: 0 MFI

## 2021-07-22 LAB — FSAB CLASS 1 ANTIBODY SPECIFICITY: HLA CLASS 1 ANTIBODY RESULT: NEGATIVE

## 2021-07-22 LAB — FSAB CLASS 2 ANTIBODY SPECIFICITY: HLA CL2 AB RESULT: NEGATIVE

## 2021-08-10 MED ORDER — METOPROLOL TARTRATE 50 MG TABLET
ORAL_TABLET | Freq: Two times a day (BID) | ORAL | 11 refills | 30 days
Start: 2021-08-10 — End: 2022-08-10

## 2021-08-10 NOTE — Unmapped (Signed)
[facility] Refill Coordination Note    Specialty Medication(s) to be Shipped:   Transplant:  mycophenolic acid 180mg  and tacrolimus 1mg     Other medication(s) to be shipped:     metoprolol  Omeprazole  Novolog    Atorvastatin    Cosopt         Benjamin Maldonado, DOB: 1975-08-14  Phone: [phone] (home)       All above HIPAA information was verified with patient.     Was a Nurse, learning disability used for this call? No    Completed refill call assessment today to schedule patient's medication shipment from the [facility] [phone]).  All relevant notes have been reviewed.     Specialty medication(s) and dose(s) confirmed: Regimen is correct and unchanged.   Changes to medications: Joh reports no changes at this time.  Changes to insurance: No  New side effects reported not previously addressed with a pharmacist or physician: None reported  Questions for the pharmacist: No    Confirmed patient received a Conservation officer, historic buildings and a Surveyor, mining with first shipment. The patient will receive a drug information handout for each medication shipped and additional FDA Medication Guides as required.       DISEASE/MEDICATION-SPECIFIC INFORMATION        N/A    SPECIALTY MEDICATION ADHERENCE     Medication Adherence    Patient reported X missed doses in the last month: 1  Specialty Medication: Mycophenolate 180mg   Patient is on additional specialty medications: Yes  Additional Specialty Medications: Tacrolimus 1mg   Patient Reported Additional Medication X Missed Doses in the Last Month: 1  Patient is on more than two specialty medications: No  Adherence tools used: patient uses a pill box to manage medications  Support network for adherence: family member        mycophenolic acid 180mg -  7  days worth of medication on hand.  tacrolimus 1mg   -   7 days worth of medication on hand.        Were doses missed due to medication being on hold? No      REFERRAL TO PHARMACIST     Referral to the pharmacist: Not needed      [facility]     Shipping address confirmed in Epic.     Delivery Scheduled: Yes, Expected medication delivery date: 08/14/21 .     Medication will be delivered via UPS to the prescription address in Epic WAM.    Ricci Barker   [facility] Specialty Technician

## 2021-08-13 MED ORDER — METOPROLOL TARTRATE 50 MG TABLET
ORAL_TABLET | Freq: Two times a day (BID) | ORAL | 11 refills | 30 days | Status: CP
Start: 2021-08-13 — End: 2022-08-13
  Filled 2021-08-13: qty 90, 30d supply, fill #0

## 2021-08-13 MED FILL — NOVOLOG FLEXPEN U-100 INSULIN ASPART 100 UNIT/ML (3 ML) SUBCUTANEOUS: SUBCUTANEOUS | 55 days supply | Qty: 30 | Fill #4

## 2021-08-13 MED FILL — TACROLIMUS 1 MG CAPSULE, IMMEDIATE-RELEASE: ORAL | 30 days supply | Qty: 360 | Fill #5

## 2021-08-13 MED FILL — MYCOPHENOLATE SODIUM 180 MG TABLET,DELAYED RELEASE: ORAL | 30 days supply | Qty: 180 | Fill #2

## 2021-08-13 MED FILL — OMEPRAZOLE 20 MG CAPSULE,DELAYED RELEASE: ORAL | 30 days supply | Qty: 30 | Fill #11

## 2021-08-18 DIAGNOSIS — Z940 Kidney transplant status: Principal | ICD-10-CM

## 2021-09-04 MED ORDER — OMEPRAZOLE 20 MG CAPSULE,DELAYED RELEASE
ORAL_CAPSULE | Freq: Every day | ORAL | 11 refills | 30 days | Status: CP
Start: 2021-09-04 — End: 2022-09-04
  Filled 2021-09-16: qty 30, 30d supply, fill #0

## 2021-09-04 NOTE — Unmapped (Signed)
[facility] Clinical Assessment & Refill Coordination Note    Benjamin Maldonado, DOB: November 05, 1975  Phone: [phone] (home)     All above HIPAA information was verified with patient.     Was a Nurse, learning disability used for this call? No    Specialty Medication(s):   Transplant:  mycophenolic acid 180mg  and tacrolimus 1mg      Current Outpatient Medications   Medication Sig Dispense Refill    acetaminophen (TYLENOL) 325 MG tablet Take 2 tablets (650 mg total) by mouth every six (6) hours as needed for pain. 100 tablet 2    aspirin (ADULT LOW DOSE ASPIRIN) 81 MG tablet Take 1 tablet (81 mg total) by mouth daily. 30 tablet 11    atorvastatin (LIPITOR) 20 MG tablet Take 1 tablet (20 mg total) by mouth daily. 90 tablet 3    blood sugar diagnostic Strp by Other route Four (4) times a day (before meals and nightly). One Touch 100 strip 5    blood-glucose meter (GLUCOSE MONITORING KIT) kit Use as instructed (Patient not taking: Reported on 07/15/2021) 1 each 0    dorzolamide-timoloL (COSOPT) 22.3-6.8 mg/mL ophthalmic solution Administer 1 drop into the left eye Two (2) times a day. 10 mL 12    GLUC.METER,DIS.P-LOADED STRIPS (GLUCOSE METER, DISP & STRIPS MISC) Frequency:PHARMDIR   Dosage:0.0     Instructions:  Note:250.00, life long Dose: 1      insulin ASPART (NOVOLOG FLEXPEN U-100 INSULIN) 100 unit/mL (3 mL) injection pen Inject 4 units under skin with breakfast, 8 units with lunch, and 6 units with supper and sliding scale. Max units 54 per day. 30 mL 11    insulin glargine (LANTUS SOLOSTAR U-100 INSULIN) 100 unit/mL (3 mL) injection pen Inject 0.16 mL (16 Units total) under the skin nightly. 15 mL 5    magnesium oxide (MAG-OX) 400 mg (241.3 mg magnesium) tablet Take 1 tablet (400 mg total) by mouth.      metoprolol tartrate (LOPRESSOR) 50 MG tablet Take 1 and 1/2 tablets (75 mg total) by mouth Two (2) times a day. 90 tablet 11    mycophenolate (MYFORTIC) 180 MG EC tablet Take 3 tablets (540 mg total) by mouth two (2) times a day. 180 tablet 11    omeprazole (PRILOSEC) 20 MG capsule Take 1 capsule (20 mg total) by mouth daily. 30 capsule 11    tacrolimus (PROGRAF) 1 MG capsule Take 6 capsules (6 mg total) by mouth two (2) times a day. 360 capsule 11     No current facility-administered medications for this visit.        Changes to medications: Benjamin Maldonado reports no changes at this time.    Allergies   Allergen Reactions    Iodine Other (See Comments)    Iodine And Iodide Containing Products     Shellfish Containing Products Other (See Comments)       Changes to allergies: No    SPECIALTY MEDICATION ADHERENCE     Tacrolimus 1 mg: 15 days of medicine on hand   Mycophenolate 180 mg: 15 days of medicine on hand     Medication Adherence    Patient reported X missed doses in the last month: 1  Specialty Medication: Mycophenolate 180mg   Patient is on additional specialty medications: Yes  Additional Specialty Medications: Tacrolimus 1mg   Patient Reported Additional Medication X Missed Doses in the Last Month: 1  Patient is on more than two specialty medications: No  Adherence tools used: patient uses a  pill box to manage medications  Support network for adherence: family member          Specialty medication(s) dose(s) confirmed: Regimen is correct and unchanged.     Are there any concerns with adherence? No    Adherence counseling provided? Not needed    CLINICAL MANAGEMENT AND INTERVENTION      Clinical Benefit Assessment:    Do you feel the medicine is effective or helping your condition? Yes    Clinical Benefit counseling provided? Not needed    Adverse Effects Assessment:    Are you experiencing any side effects? No    Are you experiencing difficulty administering your medicine? No    Quality of Life Assessment:           How many days over the past month did your kidney transplant  keep you from your normal activities? For example, brushing your teeth or getting up in the morning. 0    Have you discussed this with your provider? Not needed    Acute Infection Status:    Acute infections noted within Epic:  No active infections  Patient reported infection: None    Therapy Appropriateness:    Is therapy appropriate and patient progressing towards therapeutic goals? Yes, therapy is appropriate and should be continued    DISEASE/MEDICATION-SPECIFIC INFORMATION      N/A    PATIENT SPECIFIC NEEDS     Does the patient have any physical, cognitive, or cultural barriers? No    Is the patient high risk? Yes, patient is taking a REMS drug. Medication is dispensed in compliance with REMS program    Does the patient require a Care Management Plan? No     SOCIAL DETERMINANTS OF HEALTH     At the [facility], we have learned that life circumstances - like trouble affording food, housing, utilities, or transportation can affect the health of many of our patients.   That is why we wanted to ask: are you currently experiencing any life circumstances that are negatively impacting your health and/or quality of life? Patient declined to answer    Social Determinants of Health     Financial Resource Strain: Not on file   Internet Connectivity: Not on file   Food Insecurity: Not on file   Tobacco Use: Low Risk     Smoking Tobacco Use: Never    Smokeless Tobacco Use: Never    Passive Exposure: Not on file   Housing/Utilities: Not on file   Alcohol Use: Not on file   Transportation Needs: Not on file   Substance Use: Not on file   Health Literacy: Not on file   Physical Activity: Not on file   Interpersonal Safety: Not on file   Stress: Not on file   Intimate Partner Violence: Not on file   Depression: Not on file   Social Connections: Not on file       Would you be willing to receive help with any of the needs that you have identified today? Not applicable       SHIPPING     Specialty Medication(s) to be Shipped:   Transplant: patient declined mycophenolate and tacrolimus today.    Other medication(s) to be shipped: No additional medications requested for fill at this time     Changes to insurance: No    Delivery Scheduled: Patient declined refill at this time due to has more than 2 weeks on hand..     Medication will be delivered via UPS  to the confirmed prescription address in [facility].    The patient will receive a drug information handout for each medication shipped and additional FDA Medication Guides as required.  Verified that patient has previously received a Conservation officer, historic buildings and a Surveyor, mining.    The patient or caregiver noted above participated in the development of this care plan and knows that they can request review of or adjustments to the care plan at any time.      All of the patient's questions and concerns have been addressed.    Tera Helper   [facility] Specialty Pharmacist

## 2021-09-04 NOTE — Unmapped (Signed)
Pt request for RX Refill    omeprazole (PRILOSEC) 20 MG capsule

## 2021-09-15 NOTE — Unmapped (Signed)
[facility] Refill Coordination Note    Specialty Medication(s) to be Shipped:   Transplant:  mycophenolic acid 180mg  and tacrolimus 1mg     Other medication(s) to be shipped:       metoprolol  Omeprazole         Benjamin Maldonado, DOB: 07/18/1975  Phone: [phone] (home)       All above HIPAA information was verified with patient.     Was a Nurse, learning disability used for this call? No    Completed refill call assessment today to schedule patient's medication shipment from the [facility] [phone]).  All relevant notes have been reviewed.     Specialty medication(s) and dose(s) confirmed: Regimen is correct and unchanged.   Changes to medications: Benjamin Maldonado reports no changes at this time.  Changes to insurance: No  New side effects reported not previously addressed with a pharmacist or physician: None reported  Questions for the pharmacist: No    Confirmed patient received a Conservation officer, historic buildings and a Surveyor, mining with first shipment. The patient will receive a drug information handout for each medication shipped and additional FDA Medication Guides as required.       DISEASE/MEDICATION-SPECIFIC INFORMATION        N/A    SPECIALTY MEDICATION ADHERENCE     Medication Adherence    Patient reported X missed doses in the last month: 0  Specialty Medication: tacrolimus 1 MG capsule (PROGRAF)  Patient is on additional specialty medications: Yes  Additional Specialty Medications: [id number]  Patient Reported Additional Medication X Missed Doses in the Last Month: 0  Adherence tools used: patient uses a pill box to manage medications  Support network for adherence: family member        mycophenolic acid 180mg -  4  days worth of medication on hand.  tacrolimus 1mg   -   4 days worth of medication on hand.        Were doses missed due to medication being on hold? No      REFERRAL TO PHARMACIST     Referral to the pharmacist: Not needed      [facility]     Shipping address confirmed in Epic.     Delivery Scheduled: Yes, Expected medication delivery date: 09/17/21 .     Medication will be delivered via UPS to the prescription address in Epic WAM.    Benjamin Maldonado   [facility] Specialty Technician

## 2021-09-16 MED FILL — MYCOPHENOLATE SODIUM 180 MG TABLET,DELAYED RELEASE: ORAL | 30 days supply | Qty: 180 | Fill #3

## 2021-09-16 MED FILL — METOPROLOL TARTRATE 50 MG TABLET: ORAL | 30 days supply | Qty: 90 | Fill #1

## 2021-09-16 MED FILL — TACROLIMUS 1 MG CAPSULE, IMMEDIATE-RELEASE: ORAL | 30 days supply | Qty: 360 | Fill #6

## 2021-10-07 DIAGNOSIS — E1165 Type 2 diabetes mellitus with hyperglycemia: Principal | ICD-10-CM

## 2021-10-07 DIAGNOSIS — Z794 Long term (current) use of insulin: Principal | ICD-10-CM

## 2021-10-07 DIAGNOSIS — Z940 Kidney transplant status: Principal | ICD-10-CM

## 2021-10-07 MED ORDER — PEN NEEDLE, DIABETIC 32 GAUGE X 5/32" (4 MM)
PRN refills | 0 days
Start: 2021-10-07 — End: 2022-10-07

## 2021-10-07 NOTE — Unmapped (Signed)
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     Was a Nurse, learning disability used for this call? No    Completed refill call assessment today to schedule patient's medication shipment from the [facility] [phone]).  All relevant notes have been reviewed.     Specialty medication(s) and dose(s) confirmed: Regimen is correct and unchanged.   Changes to medications: Benjamin Maldonado reports no changes at this time.  Changes to insurance: No  New side effects reported not previously addressed with a pharmacist or physician: None reported  Questions for the pharmacist: No    Confirmed patient received a Conservation officer, historic buildings and a Surveyor, mining with first shipment. The patient will receive a drug information handout for each medication shipped and additional FDA Medication Guides as required.       DISEASE/MEDICATION-SPECIFIC INFORMATION        N/A    SPECIALTY MEDICATION ADHERENCE     Medication Adherence    Patient reported X missed doses in the last month: 0  Specialty Medication: Mycophenolate  Patient is on additional specialty medications: Yes  Additional Specialty Medications: tacrolimus  Patient Reported Additional Medication X Missed Doses in the Last Month: 0  Patient is on more than two specialty medications: No  Any gaps in refill history greater than 2 weeks in the last 3 months: no  Demonstrates understanding of importance of adherence: yes  Informant: patient  Reliability of informant: reliable  Provider-estimated medication adherence level: good  Patient is at risk for Non-Adherence: No  Reasons for non-adherence: no problems identified  Adherence tools used: patient uses a pill box to manage medications  Support network for adherence: family member  Confirmed plan for next specialty medication refill: delivery by pharmacy  Refills needed for supportive medications: not needed          Refill Coordination    Has the Patients' Contact Information Changed: No  Is the Shipping Address Different: No         Were doses missed due to medication being on hold? No    tacrolimus 1 mg: 10 days of medicine on hand   Mycophneolate 180 mg: 10 days of medicine on hand       REFERRAL TO PHARMACIST     Referral to the pharmacist: Not needed      [facility]     Shipping address confirmed in Epic.     Delivery Scheduled: Yes, Expected medication delivery date: 06/29.     Medication will be delivered via UPS to the prescription address in Epic WAM.    Antonietta Barcelona   [facility] Specialty Technician

## 2021-10-14 DIAGNOSIS — Z940 Kidney transplant status: Principal | ICD-10-CM

## 2021-10-14 DIAGNOSIS — E1165 Type 2 diabetes mellitus with hyperglycemia: Principal | ICD-10-CM

## 2021-10-14 DIAGNOSIS — Z794 Long term (current) use of insulin: Principal | ICD-10-CM

## 2021-10-14 MED ORDER — PEN NEEDLE, DIABETIC 32 GAUGE X 5/32" (4 MM)
PRN refills | 0 days | Status: CP
Start: 2021-10-14 — End: 2022-10-14

## 2021-10-14 MED FILL — METOPROLOL TARTRATE 50 MG TABLET: ORAL | 30 days supply | Qty: 90 | Fill #2

## 2021-10-14 MED FILL — MYCOPHENOLATE SODIUM 180 MG TABLET,DELAYED RELEASE: ORAL | 30 days supply | Qty: 180 | Fill #4

## 2021-10-14 MED FILL — OMEPRAZOLE 20 MG CAPSULE,DELAYED RELEASE: ORAL | 30 days supply | Qty: 30 | Fill #1

## 2021-10-14 MED FILL — TACROLIMUS 1 MG CAPSULE, IMMEDIATE-RELEASE: ORAL | 30 days supply | Qty: 360 | Fill #7

## 2021-10-14 MED FILL — NOVOLOG FLEXPEN U-100 INSULIN ASPART 100 UNIT/ML (3 ML) SUBCUTANEOUS: SUBCUTANEOUS | 55 days supply | Qty: 30 | Fill #5

## 2021-10-14 MED FILL — LANTUS SOLOSTAR U-100 INSULIN 100 UNIT/ML (3 ML) SUBCUTANEOUS PEN: SUBCUTANEOUS | 90 days supply | Qty: 15 | Fill #3

## 2021-10-14 MED FILL — ULTICARE PEN NEEDLE 32 GAUGE X 5/32" (4 MM): 25 days supply | Qty: 100 | Fill #0

## 2021-11-16 NOTE — Unmapped (Signed)
Update unos

## 2021-11-17 NOTE — Unmapped (Signed)
[facility] Refill Coordination Note    Specialty Medication(s) to be Shipped:   Transplant: Myfortic 180mg  and tacrolimus 1mg     Other medication(s) to be shipped: atorvastatin, metoprolol tartrate, omeprazole     Benjamin Maldonado, DOB: Mar 24, 1976  Phone: [phone] (home)       All above HIPAA information was verified with patient.     Was a Nurse, learning disability used for this call? No    Completed refill call assessment today to schedule patient's medication shipment from the [facility] [phone]).  All relevant notes have been reviewed.     Specialty medication(s) and dose(s) confirmed: Regimen is correct and unchanged.   Changes to medications: Benjamin Maldonado reports no changes at this time.  Changes to insurance: No  New side effects reported not previously addressed with a pharmacist or physician: None reported  Questions for the pharmacist: No    Confirmed patient received a Conservation officer, historic buildings and a Surveyor, mining with first shipment. The patient will receive a drug information handout for each medication shipped and additional FDA Medication Guides as required.       DISEASE/MEDICATION-SPECIFIC INFORMATION        N/A    SPECIALTY MEDICATION ADHERENCE     Medication Adherence    Patient reported X missed doses in the last month: 0  Specialty Medication: mycophenolate 180 mg  Patient is on additional specialty medications: Yes  Additional Specialty Medications: Tacrolimus 1 mg   Patient Reported Additional Medication X Missed Doses in the Last Month: 0  Patient is on more than two specialty medications: No  Any gaps in refill history greater than 2 weeks in the last 3 months: no  Demonstrates understanding of importance of adherence: yes  Informant: patient  Adherence tools used: patient uses a pill box to manage medications  Support network for adherence: family member              Were doses missed due to medication being on hold? No    Tacrolimus 1mg : Patient has 4 days of medication on hand  Mycophenolate 180mg : Patient has 4 days of medication on hand    REFERRAL TO PHARMACIST     Referral to the pharmacist: Not needed      [facility]     Shipping address confirmed in Epic.     Delivery Scheduled: Yes, Expected medication delivery date: 8/3.     Medication will be delivered via UPS to the prescription address in Epic WAM.    Olga Millers   [facility] Specialty Technician

## 2021-11-17 NOTE — Unmapped (Signed)
The [facility] has made a third and final attempt to reach this patient to refill the following medication:mycophenolate, tacrolimus.      We have left voicemails on the following phone numbers: [phone]  and have sent a text message to the following phone numbers: [phone] and unable to send mychart message.    Dates contacted: 7/20. 7/28. 8/1  Last scheduled delivery: 6/28    The patient may be at risk of non-compliance with this medication. The patient should call the [facility] at [phone]  Option 4, then Option 2 (all other specialty patients) to refill medication.    [name]   [facility] Specialty Pharmacist

## 2021-11-18 MED FILL — OMEPRAZOLE 20 MG CAPSULE,DELAYED RELEASE: ORAL | 30 days supply | Qty: 30 | Fill #2

## 2021-11-18 MED FILL — METOPROLOL TARTRATE 50 MG TABLET: ORAL | 30 days supply | Qty: 90 | Fill #3

## 2021-11-18 MED FILL — ATORVASTATIN 20 MG TABLET: ORAL | 90 days supply | Qty: 90 | Fill #1

## 2021-11-18 MED FILL — MYCOPHENOLATE SODIUM 180 MG TABLET,DELAYED RELEASE: ORAL | 30 days supply | Qty: 180 | Fill #5

## 2021-11-18 MED FILL — TACROLIMUS 1 MG CAPSULE, IMMEDIATE-RELEASE: ORAL | 30 days supply | Qty: 360 | Fill #8

## 2021-12-24 NOTE — Unmapped (Signed)
[facility] Clinical Assessment & Refill Coordination Note    Benjamin Maldonado, DOB: March 15, 1976  Phone: [phone] (home)     All above HIPAA information was verified with patient.     Was a Nurse, learning disability used for this call? 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 acetaminophen (TYLENOL) 325 MG tablet Take 2 tablets (650 mg total) by mouth every six (6) hours as needed for pain. 100 tablet 2   ??? aspirin (ADULT LOW DOSE ASPIRIN) 81 MG tablet Take 1 tablet (81 mg total) by mouth daily. 30 tablet 11   ??? atorvastatin (LIPITOR) 20 MG tablet Take 1 tablet (20 mg total) by mouth daily. 90 tablet 3   ??? blood sugar diagnostic Strp by Other route Four (4) times a day (before meals and nightly). One Touch 100 strip 5   ??? blood-glucose meter (GLUCOSE MONITORING KIT) kit Use as instructed (Patient not taking: Reported on 07/15/2021) 1 each 0   ??? dorzolamide-timoloL (COSOPT) 22.3-6.8 mg/mL ophthalmic solution Administer 1 drop into the left eye Two (2) times a day. 10 mL 12   ??? GLUC.METER,DIS.P-LOADED STRIPS (GLUCOSE METER, DISP & STRIPS MISC) Frequency:PHARMDIR   Dosage:0.0     Instructions:  Note:250.00, life long Dose: 1     ??? insulin ASPART (NOVOLOG FLEXPEN U-100 INSULIN) 100 unit/mL (3 mL) injection pen Inject 4 units under skin with breakfast, 8 units with lunch, and 6 units with supper and sliding scale. Max units 54 per day. 30 mL 11   ??? insulin glargine (LANTUS SOLOSTAR U-100 INSULIN) 100 unit/mL (3 mL) injection pen Inject 0.16 mL (16 Units total) under the skin nightly. 15 mL 5   ??? magnesium oxide (MAG-OX) 400 mg (241.3 mg magnesium) tablet Take 1 tablet (400 mg total) by mouth.     ??? metoprolol tartrate (LOPRESSOR) 50 MG tablet Take 1 and 1/2 tablets (75 mg total) by mouth Two (2) times a day. 90 tablet 11   ??? mycophenolate (MYFORTIC) 180 MG EC tablet Take 3 tablets (540 mg total) by mouth two (2) times a day. 180 tablet 11   ??? omeprazole (PRILOSEC) 20 MG capsule Take 1 capsule (20 mg total) by mouth daily. 30 capsule 11   ??? pen needle, diabetic (ULTICARE PEN NEEDLE) 32 gauge x 5/32 (4 mm) Ndle Use to administer insulin 4 times daily. 100 each PRN   ??? tacrolimus (PROGRAF) 1 MG capsule Take 6 capsules (6 mg total) by mouth two (2) times a day. 360 capsule 11     No current facility-administered medications for this visit.        Changes to medications: [name] reports no changes at this time.    Allergies   Allergen Reactions   ??? Iodine Other (See Comments)   ??? Iodine And Iodide Containing Products    ??? Shellfish Containing Products Other (See Comments)       Changes to allergies: No    SPECIALTY MEDICATION ADHERENCE     Mycophenolate 180mg   : 12 days of medicine on hand   Tacrolimus 1mg   : 12 days of medicine on hand       Medication Adherence    Patient reported X missed doses in the last month: 0  Specialty Medication: mycophenolate 180 mg  Patient is on additional specialty medications: Yes  Additional Specialty Medications: Tacrolimus  1 mg   Patient Reported Additional Medication X Missed Doses in the Last Month: 0  Patient is on more than two specialty medications: No      Adherence tools used: patient uses a pill box to manage medications      Support network for adherence: family member              Specialty medication(s) dose(s) confirmed: Regimen is correct and unchanged.     Are there any concerns with adherence? No    Adherence counseling provided? Not needed    CLINICAL MANAGEMENT AND INTERVENTION      Clinical Benefit Assessment:    Do you feel the medicine is effective or helping your condition? Yes    Clinical Benefit counseling provided? Not needed    Adverse Effects Assessment:    Are you experiencing any side effects? No    Are you experiencing difficulty administering your medicine? No    Quality of Life Assessment:      How many days over the past month did your transplant  keep you from your normal activities? For example, brushing your teeth or getting up in the morning. 0    Have you discussed this with your provider? Not needed    Acute Infection Status:    Acute infections noted within Epic:  No active infections  Patient reported infection: None    Therapy Appropriateness:    Is therapy appropriate and patient progressing towards therapeutic goals? Yes, therapy is appropriate and should be continued    DISEASE/MEDICATION-SPECIFIC INFORMATION      N/A    PATIENT SPECIFIC NEEDS     - Does the patient have any physical, cognitive, or cultural barriers? No    - Is the patient high risk? Yes, patient is taking a REMS drug. Medication is dispensed in compliance with REMS program    - Does the patient require a Care Management Plan? No     SOCIAL DETERMINANTS OF HEALTH     At the [facility], we have learned that life circumstances - like trouble affording food, housing, utilities, or transportation can affect the health of many of our patients.   That is why we wanted to ask: are you currently experiencing any life circumstances that are negatively impacting your health and/or quality of life? No    Social Determinants of Health     Financial Resource Strain: Not on file   Internet Connectivity: Not on file   Food Insecurity: Not on file   Tobacco Use: Low Risk  (07/15/2021)    Patient History    ??? Smoking Tobacco Use: Never    ??? Smokeless Tobacco Use: Never    ??? Passive Exposure: Not on file   Housing/Utilities: Not on file   Alcohol Use: Not on file   Transportation Needs: Not on file   Substance Use: Not on file   Health Literacy: Not on file   Physical Activity: Not on file   Interpersonal Safety: Not on file   Stress: Not on file   Intimate Partner Violence: Not on file   Depression: Not on file   Social Connections: Not on file       Would you be willing to receive help with any of the needs that you have identified today? Not applicable       SHIPPING Specialty Medication(s) to be Shipped:   Transplant:  mycophenolic acid 180mg  and tacrolimus 1mg     Other medication(s) to be shipped: cosopt, lopressor,  novolog, prilosec, pen needles     Changes to insurance: No    Delivery Scheduled: Yes, Expected medication delivery date: 12/31/2021.     Medication will be delivered via UPS to the confirmed prescription address in [facility].    The patient will receive a drug information handout for each medication shipped and additional FDA Medication Guides as required.  Verified that patient has previously received a Conservation officer, historic buildings and a Surveyor, mining.    The patient or caregiver noted above participated in the development of this care plan and knows that they can request review of or adjustments to the care plan at any time.      All of the patient's questions and concerns have been addressed.    [name]   [facility] Specialty Pharmacist

## 2021-12-30 MED FILL — ULTICARE PEN NEEDLE 32 GAUGE X 5/32" (4 MM): 25 days supply | Qty: 100 | Fill #1

## 2021-12-30 MED FILL — NOVOLOG FLEXPEN U-100 INSULIN ASPART 100 UNIT/ML (3 ML) SUBCUTANEOUS: SUBCUTANEOUS | 55 days supply | Qty: 30 | Fill #6

## 2021-12-30 MED FILL — MYCOPHENOLATE SODIUM 180 MG TABLET,DELAYED RELEASE: ORAL | 30 days supply | Qty: 180 | Fill #6

## 2021-12-30 MED FILL — DORZOLAMIDE 22.3 MG-TIMOLOL 6.8 MG/ML EYE DROPS: OPHTHALMIC | 100 days supply | Qty: 10 | Fill #1

## 2021-12-30 MED FILL — TACROLIMUS 1 MG CAPSULE, IMMEDIATE-RELEASE: ORAL | 30 days supply | Qty: 360 | Fill #9

## 2021-12-30 MED FILL — OMEPRAZOLE 20 MG CAPSULE,DELAYED RELEASE: ORAL | 30 days supply | Qty: 30 | Fill #3

## 2021-12-30 MED FILL — METOPROLOL TARTRATE 50 MG TABLET: ORAL | 30 days supply | Qty: 90 | Fill #4

## 2022-01-21 MED ORDER — INSULIN GLARGINE (U-100) 100 UNIT/ML (3 ML) SUBCUTANEOUS PEN
Freq: Every evening | SUBCUTANEOUS | 5 refills | 93 days | Status: CP
Start: 2022-01-21 — End: ?
  Filled 2022-01-25: qty 15, 93d supply, fill #0

## 2022-01-21 NOTE — Unmapped (Signed)
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     Was a Nurse, learning disability used for this call? No    Completed refill call assessment today to schedule patient's medication shipment from the [facility] [phone]).  All relevant notes have been reviewed.     Specialty medication(s) and dose(s) confirmed: Regimen is correct and unchanged.   Changes to medications: [name] reports no changes at this time.  Changes to insurance: No  New side effects reported not previously addressed with a pharmacist or physician: None reported  Questions for the pharmacist: No    Confirmed patient received a Conservation officer, historic buildings and a Surveyor, mining with first shipment. The patient will receive a drug information handout for each medication shipped and additional FDA Medication Guides as required.       DISEASE/MEDICATION-SPECIFIC INFORMATION        N/A    SPECIALTY MEDICATION ADHERENCE     Medication Adherence    Patient reported X missed doses in the last month: 1  Specialty Medication: tacrolimus 1 mg  Patient is on additional specialty medications: Yes  Additional Specialty Medications: Mycophenolate 180 mg   Patient Reported Additional Medication X Missed Doses in the Last Month: 1  Patient is on more than two specialty medications: No      Adherence tools used: patient uses a pill box to manage medications      Support network for adherence: family member                    Were doses missed due to medication being on hold? No    Tacrolimus  1 mg: 10 days of medicine on hand   mycophenolate 180 mg: 10 days of medicine on hand     REFERRAL TO PHARMACIST     Referral to the pharmacist: Not needed      [facility]     Shipping address confirmed in Epic.     Delivery Scheduled: Yes, Expected medication delivery date: 01/26/22.     Medication will be delivered via UPS to the prescription address in Epic WAM.    Benjamin Maldonado   [facility] Specialty Technician

## 2022-01-25 MED FILL — MYCOPHENOLATE SODIUM 180 MG TABLET,DELAYED RELEASE: ORAL | 30 days supply | Qty: 180 | Fill #7

## 2022-01-25 MED FILL — TACROLIMUS 1 MG CAPSULE, IMMEDIATE-RELEASE: ORAL | 30 days supply | Qty: 360 | Fill #10

## 2022-01-25 MED FILL — ULTICARE PEN NEEDLE 32 GAUGE X 5/32" (4 MM): 25 days supply | Qty: 100 | Fill #2

## 2022-01-25 MED FILL — OMEPRAZOLE 20 MG CAPSULE,DELAYED RELEASE: ORAL | 30 days supply | Qty: 30 | Fill #4

## 2022-01-25 MED FILL — METOPROLOL TARTRATE 50 MG TABLET: ORAL | 30 days supply | Qty: 90 | Fill #5

## 2022-02-05 DIAGNOSIS — H5461 Unqualified visual loss, right eye, normal vision left eye: Secondary | ICD-10-CM | POA: Insufficient documentation

## 2022-02-05 DIAGNOSIS — Z89512 Acquired absence of left leg below knee: Secondary | ICD-10-CM | POA: Insufficient documentation

## 2022-02-05 DIAGNOSIS — I739 Peripheral vascular disease, unspecified: Secondary | ICD-10-CM | POA: Insufficient documentation

## 2022-02-17 MED ORDER — INSULIN ASPART (U-100) 100 UNIT/ML (3 ML) SUBCUTANEOUS PEN
Freq: Every day | SUBCUTANEOUS | 11 refills | 55 days | Status: CP
Start: 2022-02-17 — End: ?
  Filled 2022-02-22: qty 30, 55d supply, fill #0

## 2022-02-17 NOTE — Unmapped (Signed)
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     Was a Nurse, learning disability used for this call? No    Completed refill call assessment today to schedule patient's medication shipment from the [facility] [phone]).  All relevant notes have been reviewed.     Specialty medication(s) and dose(s) confirmed: Regimen is correct and unchanged.   Changes to medications: Bensen reports no changes at this time.  Changes to insurance: No  New side effects reported not previously addressed with a pharmacist or physician: None reported  Questions for the pharmacist: No    Confirmed patient received a Conservation officer, historic buildings and a Surveyor, mining with first shipment. The patient will receive a drug information handout for each medication shipped and additional FDA Medication Guides as required.       DISEASE/MEDICATION-SPECIFIC INFORMATION        N/A    SPECIALTY MEDICATION ADHERENCE     Medication Adherence    Patient reported X missed doses in the last month: 0  Specialty Medication: tacrolimus 1 MG capsule (PROGRAF)  Patient is on additional specialty medications: Yes  Additional Specialty Medications: mycophenolate 180 MG EC tablet (MYFORTIC)  Patient Reported Additional Medication X Missed Doses in the Last Month: 0  Patient is on more than two specialty medications: No      Adherence tools used: patient uses a pill box to manage medications      Support network for adherence: family member                    Were doses missed due to medication being on hold? No    Tacrolimus  1 mg: 10 days of medicine on hand   mycophenolate 180 mg: 10 days of medicine on hand REFERRAL TO PHARMACIST     Referral to the pharmacist: Not needed      [facility]     Shipping address confirmed in Epic.     Delivery Scheduled: Yes, Expected medication delivery date: 02/23/22.     Medication will be delivered via UPS to the prescription address in Epic WAM.    [place] A Ricci Paff   [facility] Specialty Technician

## 2022-02-22 MED FILL — ULTICARE PEN NEEDLE 32 GAUGE X 5/32" (4 MM): 25 days supply | Qty: 100 | Fill #3

## 2022-02-22 MED FILL — MYCOPHENOLATE SODIUM 180 MG TABLET,DELAYED RELEASE: ORAL | 30 days supply | Qty: 180 | Fill #8

## 2022-02-22 MED FILL — TACROLIMUS 1 MG CAPSULE, IMMEDIATE-RELEASE: ORAL | 30 days supply | Qty: 360 | Fill #11

## 2022-02-22 MED FILL — OMEPRAZOLE 20 MG CAPSULE,DELAYED RELEASE: ORAL | 30 days supply | Qty: 30 | Fill #5

## 2022-02-22 MED FILL — METOPROLOL TARTRATE 50 MG TABLET: ORAL | 30 days supply | Qty: 90 | Fill #6

## 2022-02-22 MED FILL — ATORVASTATIN 20 MG TABLET: ORAL | 90 days supply | Qty: 90 | Fill #2

## 2022-03-18 DIAGNOSIS — Z940 Kidney transplant status: Principal | ICD-10-CM

## 2022-03-18 MED ORDER — TACROLIMUS 1 MG CAPSULE, IMMEDIATE-RELEASE
ORAL_CAPSULE | Freq: Two times a day (BID) | ORAL | 11 refills | 30 days | Status: CP
Start: 2022-03-18 — End: 2023-03-18
  Filled 2022-03-23: qty 360, 30d supply, fill #0

## 2022-03-18 NOTE — Unmapped (Signed)
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     Was a Nurse, learning disability used for this call? No    Completed refill call assessment today to schedule patient's medication shipment from the [facility] [phone]).  All relevant notes have been reviewed.     Specialty medication(s) and dose(s) confirmed: Regimen is correct and unchanged.   Changes to medications: Benjamin Maldonado reports no changes at this time.  Changes to insurance: No  New side effects reported not previously addressed with a pharmacist or physician: None reported  Questions for the pharmacist: No    Confirmed patient received a Conservation officer, historic buildings and a Surveyor, mining with first shipment. The patient will receive a drug information handout for each medication shipped and additional FDA Medication Guides as required.       DISEASE/MEDICATION-SPECIFIC INFORMATION        N/A    SPECIALTY MEDICATION ADHERENCE     Medication Adherence        Adherence tools used: patient uses a pill box to manage medications      Support network for adherence: family member                  Were doses missed due to medication being on hold? No    Tacrolimus 1 mg: 14 days of medicine on hand   mycophenolate 180 mg: 14 days of medicine on hand     REFERRAL TO PHARMACIST     Referral to the pharmacist: Not needed      SHIPPING     Shipping address confirmed in Epic.     Delivery Scheduled: Yes, Expected medication delivery date: 03/24/22.  However, Rx request for refills was sent to the provider as there are none remaining.     Medication will be delivered via UPS to the prescription address in Epic WAM.    [name], [facility]   [facility] Specialty Pharmacist

## 2022-03-23 MED FILL — OMEPRAZOLE 20 MG CAPSULE,DELAYED RELEASE: ORAL | 30 days supply | Qty: 30 | Fill #6

## 2022-03-23 MED FILL — METOPROLOL TARTRATE 50 MG TABLET: ORAL | 30 days supply | Qty: 90 | Fill #7

## 2022-03-23 MED FILL — MYCOPHENOLATE SODIUM 180 MG TABLET,DELAYED RELEASE: ORAL | 30 days supply | Qty: 180 | Fill #9

## 2022-04-14 NOTE — Unmapped (Signed)
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     Was a Nurse, learning disability used for this call? No    Completed refill call assessment today to schedule patient's medication shipment from the [facility] [phone]).  All relevant notes have been reviewed.     Specialty medication(s) and dose(s) confirmed: Regimen is correct and unchanged.   Changes to medications: [name] reports no changes at this time.  Changes to insurance: No  New side effects reported not previously addressed with a pharmacist or physician: None reported  Questions for the pharmacist: No    Confirmed patient received a Conservation officer, historic buildings and a Surveyor, mining with first shipment. The patient will receive a drug information handout for each medication shipped and additional FDA Medication Guides as required.       DISEASE/MEDICATION-SPECIFIC INFORMATION        N/A    SPECIALTY MEDICATION ADHERENCE     Medication Adherence    Patient reported X missed doses in the last month: 0  Specialty Medication: mycophenolate 180 MG EC tablet (MYFORTIC)  Patient is on additional specialty medications: Yes  Additional Specialty Medications: tacrolimus 1 MG capsule (PROGRAF)  Patient Reported Additional Medication X Missed Doses in the Last Month: 0  Patient is on more than two specialty medications: No      Adherence tools used: patient uses a pill box to manage medications      Support network for adherence: family member                    Were doses missed due to medication being on hold? No    tacrolimus 1 mg: 10 days of medicine on hand   mycophenolate 180 mg: 10 days of medicine on hand        REFERRAL TO PHARMACIST Referral to the pharmacist: Not needed      [facility]     Shipping address confirmed in Epic.     Delivery Scheduled: Yes, Expected medication delivery date: 04/21/22.     Medication will be delivered via UPS to the prescription address in Epic WAM.    Benjamin Maldonado   [facility] Specialty Technician

## 2022-04-20 MED FILL — LANTUS SOLOSTAR U-100 INSULIN 100 UNIT/ML (3 ML) SUBCUTANEOUS PEN: SUBCUTANEOUS | 93 days supply | Qty: 15 | Fill #1

## 2022-04-20 MED FILL — OMEPRAZOLE 20 MG CAPSULE,DELAYED RELEASE: ORAL | 30 days supply | Qty: 30 | Fill #7

## 2022-04-20 MED FILL — MYCOPHENOLATE SODIUM 180 MG TABLET,DELAYED RELEASE: ORAL | 30 days supply | Qty: 180 | Fill #10

## 2022-04-20 MED FILL — METOPROLOL TARTRATE 50 MG TABLET: ORAL | 30 days supply | Qty: 90 | Fill #8

## 2022-04-20 MED FILL — TACROLIMUS 1 MG CAPSULE, IMMEDIATE-RELEASE: ORAL | 30 days supply | Qty: 360 | Fill #1

## 2022-04-27 DIAGNOSIS — K219 Gastro-esophageal reflux disease without esophagitis: Secondary | ICD-10-CM | POA: Diagnosis not present

## 2022-04-27 DIAGNOSIS — I10 Essential (primary) hypertension: Secondary | ICD-10-CM | POA: Diagnosis not present

## 2022-04-27 DIAGNOSIS — E119 Type 2 diabetes mellitus without complications: Secondary | ICD-10-CM | POA: Diagnosis not present

## 2022-04-27 DIAGNOSIS — Z89512 Acquired absence of left leg below knee: Secondary | ICD-10-CM | POA: Diagnosis not present

## 2022-05-18 NOTE — Unmapped (Signed)
[facility] Refill Coordination Note    Specialty Medication(s) to be Shipped:   Transplant: mycophenolate mofetil 180mg  and tacrolimus 1mg     Other medication(s) to be shipped:  Atovastatin 20mg  ,dorzolamide-timolol,metroprolol,omeprazole     Benjamin Maldonado, DOB: 18-Nov-1975  Phone: [phone] (home)       All above HIPAA information was verified with patient.     Was a Nurse, learning disability used for this call? No    Completed refill call assessment today to schedule patient's medication shipment from the [facility] [phone]).  All relevant notes have been reviewed.     Specialty medication(s) and dose(s) confirmed: Regimen is correct and unchanged.   Changes to medications: [name] reports no changes at this time.  Changes to insurance: No  New side effects reported not previously addressed with a pharmacist or physician: None reported  Questions for the pharmacist: No    Confirmed patient received a Conservation officer, historic buildings and a Surveyor, mining with first shipment. The patient will receive a drug information handout for each medication shipped and additional FDA Medication Guides as required.       DISEASE/MEDICATION-SPECIFIC INFORMATION        N/A    SPECIALTY MEDICATION ADHERENCE     Medication Adherence    Patient reported X missed doses in the last month: 0  Specialty Medication: mycophenolate 180mg   Patient is on additional specialty medications: Yes  Additional Specialty Medications: Tacrolimus 1mg    Patient Reported Additional Medication X Missed Doses in the Last Month: 0  Patient is on more than two specialty medications: No  Any gaps in refill history greater than 2 weeks in the last 3 months: no  Demonstrates understanding of importance of adherence: yes  Informant: patient  Reliability of informant: reliable  Provider-estimated medication adherence level: good  Patient is at risk for Non-Adherence: No  Reasons for non-adherence: no problems identified Adherence tools used: patient uses a pill box to manage medications      Support network for adherence: family member      Confirmed plan for next specialty medication refill: delivery by pharmacy  Refills needed for supportive medications: not needed          Refill Coordination    Has the Patients' Contact Information Changed: No  Is the Shipping Address Different: No         Were doses missed due to medication being on hold? No    tacrolimus 1 mg: 7 days of medicine on hand   mycophenolate 180 mg: 7 days of medicine on hand       REFERRAL TO PHARMACIST     Referral to the pharmacist: Not needed      [facility]     Shipping address confirmed in Epic.     Delivery Scheduled: Yes, Expected medication delivery date: 02/01.     Medication will be delivered via UPS to the prescription address in Epic WAM.    Antonietta Barcelona   [facility] Specialty Technician

## 2022-05-19 DIAGNOSIS — F4323 Adjustment disorder with mixed anxiety and depressed mood: Secondary | ICD-10-CM | POA: Diagnosis not present

## 2022-05-19 MED FILL — OMEPRAZOLE 20 MG CAPSULE,DELAYED RELEASE: ORAL | 30 days supply | Qty: 30 | Fill #8

## 2022-05-19 MED FILL — METOPROLOL TARTRATE 50 MG TABLET: ORAL | 30 days supply | Qty: 90 | Fill #9

## 2022-05-19 MED FILL — TACROLIMUS 1 MG CAPSULE, IMMEDIATE-RELEASE: ORAL | 30 days supply | Qty: 360 | Fill #2

## 2022-05-19 MED FILL — DORZOLAMIDE 22.3 MG-TIMOLOL 6.8 MG/ML EYE DROPS: OPHTHALMIC | 100 days supply | Qty: 10 | Fill #2

## 2022-05-19 MED FILL — MYCOPHENOLATE SODIUM 180 MG TABLET,DELAYED RELEASE: ORAL | 30 days supply | Qty: 180 | Fill #11

## 2022-05-19 MED FILL — ATORVASTATIN 20 MG TABLET: ORAL | 90 days supply | Qty: 90 | Fill #3

## 2022-05-27 DIAGNOSIS — F4323 Adjustment disorder with mixed anxiety and depressed mood: Secondary | ICD-10-CM | POA: Diagnosis not present

## 2022-06-08 DIAGNOSIS — I10 Essential (primary) hypertension: Secondary | ICD-10-CM | POA: Diagnosis not present

## 2022-06-08 DIAGNOSIS — Z89512 Acquired absence of left leg below knee: Secondary | ICD-10-CM | POA: Diagnosis not present

## 2022-06-08 DIAGNOSIS — E119 Type 2 diabetes mellitus without complications: Secondary | ICD-10-CM | POA: Diagnosis not present

## 2022-06-08 DIAGNOSIS — K219 Gastro-esophageal reflux disease without esophagitis: Secondary | ICD-10-CM | POA: Diagnosis not present

## 2022-06-10 DIAGNOSIS — Z940 Kidney transplant status: Principal | ICD-10-CM

## 2022-06-10 MED ORDER — MYCOPHENOLATE SODIUM 180 MG TABLET,DELAYED RELEASE
ORAL_TABLET | Freq: Two times a day (BID) | ORAL | 11 refills | 30 days | Status: CP
Start: 2022-06-10 — End: 2023-06-10
  Filled 2022-06-18: qty 180, 30d supply, fill #0

## 2022-06-10 NOTE — Unmapped (Signed)
[facility] Refill Coordination Note    Specialty Medication(s) to be Shipped:   Transplant: mycophenolate mofetil 180mg  and tacrolimus 1mg     Other medication(s) to be shipped:  metoprolol and omeprazole      Benjamin Maldonado, DOB: 1975/09/22  Phone: [phone] (home)       All above HIPAA information was verified with patient.     Was a Nurse, learning disability used for this call? No    Completed refill call assessment today to schedule patient's medication shipment from the [facility] [phone]).  All relevant notes have been reviewed.     Specialty medication(s) and dose(s) confirmed: Regimen is correct and unchanged.   Changes to medications: [name] reports no changes at this time.  Changes to insurance: No  New side effects reported not previously addressed with a pharmacist or physician: None reported  Questions for the pharmacist: No    Confirmed patient received a Conservation officer, historic buildings and a Surveyor, mining with first shipment. The patient will receive a drug information handout for each medication shipped and additional FDA Medication Guides as required.       DISEASE/MEDICATION-SPECIFIC INFORMATION        N/A    SPECIALTY MEDICATION ADHERENCE     Medication Adherence    Patient reported X missed doses in the last month: 0  Specialty Medication: mycophenolate 180 MG EC tablet (MYFORTIC)  Patient is on additional specialty medications: Yes  Additional Specialty Medications: tacrolimus 1 MG capsule (PROGRAF)  Patient Reported Additional Medication X Missed Doses in the Last Month: 0  Patient is on more than two specialty medications: No  Adherence tools used: patient uses a pill box to manage medications  Support network for adherence: family member              Were doses missed due to medication being on hold? No    mycophenolate 180 mg: 10 days of medicine on hand   Tacrolimus 1 mg: 10 days of medicine on hand       REFERRAL TO PHARMACIST     Referral to the pharmacist: Not needed      [facility]     Shipping address confirmed in Epic.     Patient was notified of new phone menu : No    Delivery Scheduled: Yes, Expected medication delivery date: 06/18/22.     Medication will be delivered via UPS to the prescription address in Epic WAM.    [name]   [facility] Specialty Technician

## 2022-06-18 MED FILL — TACROLIMUS 1 MG CAPSULE, IMMEDIATE-RELEASE: ORAL | 30 days supply | Qty: 360 | Fill #3

## 2022-06-18 MED FILL — METOPROLOL TARTRATE 50 MG TABLET: ORAL | 30 days supply | Qty: 90 | Fill #10

## 2022-06-18 MED FILL — OMEPRAZOLE 20 MG CAPSULE,DELAYED RELEASE: ORAL | 30 days supply | Qty: 30 | Fill #9

## 2022-06-22 DIAGNOSIS — Z940 Kidney transplant status: Principal | ICD-10-CM

## 2022-06-24 DIAGNOSIS — F4323 Adjustment disorder with mixed anxiety and depressed mood: Secondary | ICD-10-CM | POA: Diagnosis not present

## 2022-07-08 NOTE — Unmapped (Signed)
[facility] Refill Coordination Note    Specialty Medication(s) to be Shipped:   Transplant:  mycophenolic acid 180mg  and tacrolimus 1mg     Other medication(s) to be shipped:  metoprolol, omeprazole, lantus     Benjamin Maldonado, DOB: 1975/11/25  Phone: [phone] (home)       All above HIPAA information was verified with patient.     Was a Nurse, learning disability used for this call? No    Completed refill call assessment today to schedule patient's medication shipment from the [facility] [phone]).  All relevant notes have been reviewed.     Specialty medication(s) and dose(s) confirmed: Regimen is correct and unchanged.   Changes to medications: Fabien reports no changes at this time.  Changes to insurance: No  New side effects reported not previously addressed with a pharmacist or physician: None reported  Questions for the pharmacist: No    Confirmed patient received a Conservation officer, historic buildings and a Surveyor, mining with first shipment. The patient will receive a drug information handout for each medication shipped and additional FDA Medication Guides as required.       DISEASE/MEDICATION-SPECIFIC INFORMATION        N/A    SPECIALTY MEDICATION ADHERENCE     Medication Adherence    Patient reported X missed doses in the last month: 2  Specialty Medication: Mycophenolate 180mg   Patient is on additional specialty medications: Yes  Additional Specialty Medications: Tacrolimus 1mg   Patient Reported Additional Medication X Missed Doses in the Last Month: 2  Patient is on more than two specialty medications: No  Adherence tools used: patient uses a pill box to manage medications  Support network for adherence: family member              Were doses missed due to medication being on hold? No    Mycophenolate 180 mg: 10 days of medicine on hand   Tacrolimus 1 mg: 7 days of medicine on hand     REFERRAL TO PHARMACIST     Referral to the pharmacist: Not needed      [facility]     Shipping address confirmed in Epic.     Patient was notified of new phone menu : No    Delivery Scheduled: Yes, Expected medication delivery date: 07/13/22.     Medication will be delivered via UPS to the prescription address in Epic WAM.    Tera Helper, [facility]   [facility] Specialty Pharmacist

## 2022-07-12 DIAGNOSIS — I10 Essential (primary) hypertension: Secondary | ICD-10-CM | POA: Diagnosis not present

## 2022-07-12 DIAGNOSIS — S88112D Complete traumatic amputation at level between knee and ankle, left lower leg, subsequent encounter: Secondary | ICD-10-CM | POA: Diagnosis not present

## 2022-07-12 DIAGNOSIS — E103593 Type 1 diabetes mellitus with proliferative diabetic retinopathy without macular edema, bilateral: Secondary | ICD-10-CM | POA: Diagnosis not present

## 2022-07-12 DIAGNOSIS — Z940 Kidney transplant status: Secondary | ICD-10-CM | POA: Diagnosis not present

## 2022-07-12 DIAGNOSIS — Z1211 Encounter for screening for malignant neoplasm of colon: Secondary | ICD-10-CM | POA: Diagnosis not present

## 2022-07-12 MED FILL — LANTUS SOLOSTAR U-100 INSULIN 100 UNIT/ML (3 ML) SUBCUTANEOUS PEN: SUBCUTANEOUS | 93 days supply | Qty: 15 | Fill #2

## 2022-07-12 MED FILL — TACROLIMUS 1 MG CAPSULE, IMMEDIATE-RELEASE: ORAL | 30 days supply | Qty: 360 | Fill #4

## 2022-07-12 MED FILL — OMEPRAZOLE 20 MG CAPSULE,DELAYED RELEASE: ORAL | 30 days supply | Qty: 30 | Fill #10

## 2022-07-12 MED FILL — METOPROLOL TARTRATE 50 MG TABLET: ORAL | 30 days supply | Qty: 90 | Fill #11

## 2022-07-12 MED FILL — MYCOPHENOLATE SODIUM 180 MG TABLET,DELAYED RELEASE: ORAL | 30 days supply | Qty: 180 | Fill #1

## 2022-07-23 DIAGNOSIS — F4323 Adjustment disorder with mixed anxiety and depressed mood: Secondary | ICD-10-CM | POA: Diagnosis not present

## 2022-08-04 MED ORDER — METOPROLOL TARTRATE 50 MG TABLET
ORAL_TABLET | Freq: Two times a day (BID) | ORAL | 11 refills | 30 days | Status: CP
Start: 2022-08-04 — End: 2023-08-04
  Filled 2022-08-10: qty 90, 30d supply, fill #0

## 2022-08-04 MED ORDER — ATORVASTATIN 20 MG TABLET
ORAL_TABLET | Freq: Every day | ORAL | 3 refills | 90 days | Status: CP
Start: 2022-08-04 — End: 2023-08-04
  Filled 2022-08-10: qty 90, 90d supply, fill #0

## 2022-08-04 NOTE — Unmapped (Signed)
The patient is requesting a medication refill

## 2022-08-04 NOTE — Unmapped (Signed)
[facility] Refill Coordination Note    Specialty Medication(s) to be Shipped:   Transplant: mycophenolate mofetil 180 mg and tacrolimus 1mg     Other medication(s) to be shipped:  atorvastatin, metoprolol and omeprazole     Benjamin Maldonado, DOB: 1976-01-05  Phone: [phone] (home)       All above HIPAA information was verified with patient.     Was a Nurse, learning disability used for this call? No    Completed refill call assessment today to schedule patient's medication shipment from the [facility] [phone]).  All relevant notes have been reviewed.     Specialty medication(s) and dose(s) confirmed: Regimen is correct and unchanged.   Changes to medications: Benjamin Maldonado reports no changes at this time.  Changes to insurance: No  New side effects reported not previously addressed with a pharmacist or physician: None reported  Questions for the pharmacist: No    Confirmed patient received a Conservation officer, historic buildings and a Surveyor, mining with first shipment. The patient will receive a drug information handout for each medication shipped and additional FDA Medication Guides as required.       DISEASE/MEDICATION-SPECIFIC INFORMATION        N/A    SPECIALTY MEDICATION ADHERENCE     Medication Adherence    Patient reported X missed doses in the last month: 0  Specialty Medication: mycophenolate 180 MG EC tablet (MYFORTIC)  Patient is on additional specialty medications: Yes  Additional Specialty Medications: tacrolimus 1 MG capsule (PROGRAF)  Patient Reported Additional Medication X Missed Doses in the Last Month: 0  Patient is on more than two specialty medications: No  Adherence tools used: patient uses a pill box to manage medications  Support network for adherence: family member              Were doses missed due to medication being on hold? No    tacrolimus 1 mg: 10 days of medicine on hand   mycophenolate 180 mg: 10 days of medicine on hand       REFERRAL TO PHARMACIST     Referral to the pharmacist: Not needed      [facility]     Shipping address confirmed in Epic.       Delivery Scheduled: Yes, Expected medication delivery date: 08/11/22.     Medication will be delivered via UPS to the prescription address in Epic WAM.    Benjamin Maldonado   [facility] Specialty Technician

## 2022-08-10 ENCOUNTER — Ambulatory Visit: Payer: Medicare PPO | Attending: Physical Therapy | Admitting: Physical Therapy

## 2022-08-10 ENCOUNTER — Ambulatory Visit (INDEPENDENT_AMBULATORY_CARE_PROVIDER_SITE_OTHER): Payer: Medicare PPO | Admitting: Orthopedic Surgery

## 2022-08-10 DIAGNOSIS — I87332 Chronic venous hypertension (idiopathic) with ulcer and inflammation of left lower extremity: Secondary | ICD-10-CM | POA: Diagnosis not present

## 2022-08-10 MED FILL — OMEPRAZOLE 20 MG CAPSULE,DELAYED RELEASE: ORAL | 30 days supply | Qty: 30 | Fill #11

## 2022-08-10 MED FILL — TACROLIMUS 1 MG CAPSULE, IMMEDIATE-RELEASE: ORAL | 30 days supply | Qty: 360 | Fill #5

## 2022-08-10 MED FILL — MYCOPHENOLATE SODIUM 180 MG TABLET,DELAYED RELEASE: ORAL | 30 days supply | Qty: 180 | Fill #2

## 2022-08-15 ENCOUNTER — Encounter: Payer: Self-pay | Admitting: Orthopedic Surgery

## 2022-08-15 NOTE — Progress Notes (Signed)
Office Visit Note   Patient: Anthony Blanchard           Date of Birth: November 26, 1975           MRN: [id number] Visit Date: 08/10/2022              Requested by: No referring provider defined for this encounter. PCP: No primary care provider on file.  Chief Complaint  Patient presents with   Left Leg - Open Wound      HPI: Patient is a 47 year old gentleman who presents with left leg ulceration status post transtibial amputation in [place] in 2021.  Assessment & Plan: Visit Diagnoses:  1. Chronic venous hypertension (idiopathic) with ulcer and inflammation of left lower extremity (HCC)     Plan: Prescription was provided for [facility] for new socket liner materials and supplies.  Follow-Up Instructions: No follow-ups on file.   Ortho Exam  Patient is alert, oriented, no adenopathy, well-dressed, normal affect, normal respiratory effort. Examination patient has a well consolidated residual limb.  There is a ulcer over the residual limb from subsiding into the socket.  The ulcer is 2 cm in diameter.  This does not probe to bone or tendon there is no drainage no cellulitis.  Patient's socket is broken and the liner is worn. Patient is an existing left transtibial  amputee.  Patient's current comorbidities are not expected to impact the ability to function with the prescribed prosthesis. Patient verbally communicates a strong desire to use a prosthesis. Patient currently requires mobility aids to ambulate without a prosthesis.  Expects not to use mobility aids with a new prosthesis.  Patient is a K3 level ambulator that spends a lot of time walking around on uneven terrain over obstacles, up and down stairs, and ambulates with a variable cadence.     Imaging: No results found.   Labs: Lab Results  Component Value Date   HGBA1C 12.1 (H) 10/28/2019   HGBA1C 7.2 (A) 02/22/2019   REPTSTATUS 11/08/2019 FINAL 11/03/2019   GRAMSTAIN  11/03/2019    RARE WBC PRESENT,  PREDOMINANTLY PMN ABUNDANT GRAM POSITIVE COCCI IN PAIRS IN CLUSTERS FEW GRAM NEGATIVE RODS    CULT  11/03/2019    MODERATE GROUP B STREP(S.AGALACTIAE)ISOLATED TESTING AGAINST S. AGALACTIAE NOT ROUTINELY PERFORMED DUE TO PREDICTABILITY OF AMP/PEN/VAN SUSCEPTIBILITY. RARE STAPHYLOCOCCUS AUREUS FEW PREVOTELLA BIVIA BETA LACTAMASE POSITIVE Performed at [facility], [address], [place], [place] [address]    [facility] STAPHYLOCOCCUS AUREUS 11/03/2019     Lab Results  Component Value Date   ALBUMIN 2.8 (L) 11/13/2019   ALBUMIN 2.8 (L) 10/28/2019    No results found for: "MG" No results found for: "VD25OH"  No results found for: "PREALBUMIN"    Latest Ref Rng & Units 11/13/2019    1:47 PM 11/04/2019    5:53 AM 11/02/2019    3:35 AM  CBC EXTENDED  WBC 4.0 - 10.5 K/uL 14.2  10.9  13.9   RBC 4.22 - 5.81 MIL/uL 3.84  3.65  3.90   Hemoglobin 13.0 - 17.0 g/dL 9.5  9.1  9.6   HCT 19.1 - 52.0 % 33.8  30.5  32.6   Platelets 150 - 400 K/uL 290  207  183   NEUT# 1.7 - 7.7 K/uL 12.8  8.3  10.8   Lymph# 0.7 - 4.0 K/uL 0.7  1.5  1.5      There is no height or weight on file to calculate BMI.  Orders:  No orders  of the defined types were placed in this encounter.  No orders of the defined types were placed in this encounter.    Procedures: No procedures performed  Clinical Data: No additional findings.  ROS:  All other systems negative, except as noted in the HPI. Review of Systems  Objective: Vital Signs: There were no vitals taken for this visit.  Specialty Comments:  No specialty comments available.  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   Immunosuppressed status (HCC) 12/12/2014   LVH (left ventricular hypertrophy) due to hypertensive disease 12/12/2014   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/10/2014   Diabetes mellitus (HCC) 01/05/2011   Hypertension, benign 12/25/2009   Past Medical History:  Diagnosis Date   Abnormal results of thyroid function studies 08/16/2011   Chronic kidney disease    Coronary artery disease    Diabetes mellitus without complication (HCC)    Diabetic foot ulcers (HCC) 10/2019   GERD (gastroesophageal reflux disease)    Glaucoma    Hypertension    Overweight    Partial blindness     Family History  Problem Relation Age of Onset   Diabetes Mother    Alcohol abuse Father     Past Surgical History:  Procedure Laterality Date   BONE BIOPSY Bilateral 11/03/2019   Procedure: BONE BIOPSY;  Surgeon: Asencion Islam, DPM;  Location: [facility] OR;  Service: Podiatry;  Laterality: Bilateral;   CARDIAC CATHETERIZATION     EYE SURGERY     GRAFT APPLICATION Bilateral 11/03/2019   Procedure: GRAFT APPLICATION;  Surgeon: Asencion Islam, DPM;  Location: [facility] OR;  Service: Podiatry;  Laterality: Bilateral;  Integra bilayer 8x10   INCISION AND DRAINAGE OF WOUND Bilateral 11/03/2019   Procedure: IRRIGATION AND DEBRIDEMENT WOUND;  Surgeon: Asencion Islam, DPM;  Location: [facility] OR;  Service: Podiatry;  Laterality: Bilateral;   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   Social History   Occupational History   Not on file  Tobacco Use   Smoking status: Never   Smokeless tobacco: Never  Vaping Use   Vaping Use: Never used  Substance and Sexual Activity   Alcohol use: Not Currently   Drug use: Never   Sexual activity: Not on file

## 2022-08-16 ENCOUNTER — Ambulatory Visit: Payer: Medicare PPO | Admitting: Physical Therapy

## 2022-08-16 DIAGNOSIS — Z48298 Encounter for aftercare following other organ transplant: Principal | ICD-10-CM

## 2022-08-16 DIAGNOSIS — E1165 Type 2 diabetes mellitus with hyperglycemia: Principal | ICD-10-CM

## 2022-08-16 DIAGNOSIS — N186 End stage renal disease: Principal | ICD-10-CM

## 2022-08-16 DIAGNOSIS — Z940 Kidney transplant status: Principal | ICD-10-CM

## 2022-08-16 DIAGNOSIS — Z794 Long term (current) use of insulin: Principal | ICD-10-CM

## 2022-08-16 DIAGNOSIS — R2689 Other abnormalities of gait and mobility: Secondary | ICD-10-CM

## 2022-08-16 NOTE — Therapy (Signed)
OUTPATIENT PHYSICAL THERAPY PROSTHETICS EVALUATION- ARRIVED NO CHARGE   Patient Name: Anthony Blanchard MRN: [id number] DOB:1975/08/20, 47 y.o., male Today's Date: 08/16/2022  PCP: Anthony Blanchard, Anthony Brasil, MD REFERRING PROVIDER: Dorna Blanchard, Anthony Brasil, MD  END OF SESSION:  PT End of Session - 08/16/22 1321     Visit Number 1    Number of Visits 1    Authorization Type [employer]    PT Start Time 1318    PT Stop Time 1345   Arrived no charge   PT Time Calculation (min) 27 min    Equipment Utilized During Treatment Other (comment)   L BKA prosthetic   Activity Tolerance Patient tolerated treatment well    Behavior During Therapy [facility] for tasks assessed/performed             Past Medical History:  Diagnosis Date   Abnormal results of thyroid function studies 08/16/2011   Chronic kidney disease    Coronary artery disease    Diabetes mellitus without complication (HCC)    Diabetic foot ulcers (HCC) 10/2019   GERD (gastroesophageal reflux disease)    Glaucoma    Hypertension    Overweight    Partial blindness    Past Surgical History:  Procedure Laterality Date   BONE BIOPSY Bilateral 11/03/2019   Procedure: BONE BIOPSY;  Surgeon: Asencion Islam, DPM;  Location: [facility] OR;  Service: Podiatry;  Laterality: Bilateral;   CARDIAC CATHETERIZATION     EYE SURGERY     GRAFT APPLICATION Bilateral 11/03/2019   Procedure: GRAFT APPLICATION;  Surgeon: Asencion Islam, DPM;  Location: [facility];  Service: Podiatry;  Laterality: Bilateral;  Integra bilayer 8x10   INCISION AND DRAINAGE OF WOUND Bilateral 11/03/2019   Procedure: IRRIGATION AND DEBRIDEMENT WOUND;  Surgeon: Asencion Islam, DPM;  Location: [facility];  Service: Podiatry;  Laterality: Bilateral;   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   Patient Active Problem List   Diagnosis Date Noted   DKA (diabetic ketoacidosis) (HCC) 10/28/2019   Sepsis (HCC) 10/28/2019   Essential hypertension 10/28/2019   Hyperkalemia 10/28/2019   CKD (chronic  kidney disease) stage 4, GFR 15-29 ml/min (HCC) 10/28/2019   Diabetic foot ulcer (HCC) 10/28/2019   DKA (diabetic ketoacidoses) 10/28/2019   Hyperlipidemia 02/22/2019   Trigger middle finger of left hand 12/18/2018   CAD (coronary artery disease) 08/29/2017   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   Aftercare following organ transplant 12/12/2014   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   Immunosuppressed status (HCC) 12/12/2014   LVH (left ventricular hypertrophy) due to hypertensive disease 12/12/2014   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/10/2014   Diabetes mellitus (HCC) 01/05/2011   Hypertension, benign 12/25/2009    ONSET DATE: 07/27/2022 (referral)  REFERRING DIAG: W09.8119 (ICD-10-CM) - 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 Pt reports he had his initial amputation in July 2020 and has had several infections/revisions since. Saw Dr. Lajoyce Corners last week and he is recommending pt go to [facility] for revision to socket prior to PT. Pt has not contacted [facility], just recently moved to [place] from [facility]. Pt states he is on his leg for about 2 hours per day and uses knee scooter for  most of mobility. Most recent fall was in November, in which he fell off his scooter and landed on his L residual limb.   Pt accompanied by: self and Sister Anthony Blanchard - in [place])   PERTINENT HISTORY: diabetes mellitus, chronic kidney disease status post kidney transplant, movement of right eye complication of surgery, CAD, hypertension, hyperlipidemiaPatient is a K3 level ambulator that spends a lot of time walking around on uneven  terrain over obstacles, up and down stairs, and ambulates with a variable cadence.There is a ulcer over the residual limb from subsiding into the socket. The ulcer is 2 cm in diameter. This does not probe to bone or tendon there is no drainage no cellulitis. Patient's socket is broken and the liner is worn.  PAIN:  Are you having pain? No  PRECAUTIONS: Fall and Other: Legally Blind  WEIGHT BEARING RESTRICTIONS: No  FALLS: Has patient fallen in last 6 months? Yes. Number of falls 1, fell off power scooter   LIVING ENVIRONMENT: Lives with: lives alone Lives in: House/apartment Home Access: Stairs to enter Home layout: One level Stairs: Yes: External: 3 + 7 steps; set of 7 steps have a rail, 3 steps do not have a rail Has following equipment at home: Single point cane and knee scooter and L BKA prosthetic    PLOF: Requires assistive device for independence and Needs assistance with homemaking  PATIENT GOALS: "to just stand longer"   OBJECTIVE:   COGNITION: Overall cognitive status: Within functional limits for tasks assessed   SENSATION: Pt reports phantom sensation in LLE - not pain.    POSTURE: rounded shoulders and forward head   BED MOBILITY:  Independent per pt  TRANSFERS: Sit to stand: SBA Stand to sit: SBA   GAIT: Gait pattern:  reliant on furniture walking due to blindess, step through pattern, and decreased stride length Distance walked: Various clinic distances Assistive device utilized: Single point cane and L BKA prosthesis  Level of assistance: Min A Comments: Pt requires max multimodal cues for safe ambulation due to impaired vision. Pt ambulates well on prosthetic    CURRENT PROSTHETIC WEAR ASSESSMENT: Patient is independent with:  N/A Patient is dependent with: skin check, residual limb care, prosthetic cleaning, ply sock cleaning, correct ply sock adjustment, proper wear schedule/adjustment, and proper weight-bearing schedule/adjustment Donning  prosthesis: Modified independence Doffing prosthesis: Modified independence Prosthetic wear tolerance: 2 hours/day, 7 days/week Residual limb condition: 2cm ulceration on distal aspect of limb Prosthetic description: PTB prosthesis w/pin lock. Pt's liner very worn and per Dr. Lajoyce Corners, prosthesis is broken  K code/activity level with prosthetic use: Level 2  PATIENT EDUCATION: Education details: Establishing care w/[facility] and returning once prosthesis fixed and pt has new liner due to presence of wound on residual limb. Called [facility] w/pt during eval and scheduled appointment for 5/6 at 9am. Provided pt w/written sheet of address for [facility], appointment date and time and instructions to call PT clinic back once prosthesis is fixed to start PT  Person educated: Patient and Sister Education method: Chief Technology Officer Education comprehension: verbalized understanding   ASSESSMENT:  CLINICAL IMPRESSION: Evaluation not completed this date as pt has ulcer on residual limb and is not established with [facility], as he recently moved to [place] from [facility]. Called [facility] during session and established appointment to have pt provided w/new liner and updates to prosthesis. Informed pt that once he receives updates to prosthesis, we can start PT. Pt verbalized understanding.     Jill Alexanders Sabree Nuon, PT, DPT  08/16/2022, 1:48 PM

## 2022-08-25 DIAGNOSIS — E1151 Type 2 diabetes mellitus with diabetic peripheral angiopathy without gangrene: Secondary | ICD-10-CM | POA: Diagnosis not present

## 2022-08-25 DIAGNOSIS — Z89512 Acquired absence of left leg below knee: Secondary | ICD-10-CM | POA: Diagnosis not present

## 2022-08-25 DIAGNOSIS — Z940 Kidney transplant status: Secondary | ICD-10-CM | POA: Diagnosis not present

## 2022-08-25 DIAGNOSIS — S88112D Complete traumatic amputation at level between knee and ankle, left lower leg, subsequent encounter: Secondary | ICD-10-CM | POA: Diagnosis not present

## 2022-08-25 DIAGNOSIS — E1165 Type 2 diabetes mellitus with hyperglycemia: Secondary | ICD-10-CM | POA: Diagnosis not present

## 2022-08-25 DIAGNOSIS — Z79899 Other long term (current) drug therapy: Secondary | ICD-10-CM | POA: Diagnosis not present

## 2022-08-25 DIAGNOSIS — I10 Essential (primary) hypertension: Secondary | ICD-10-CM | POA: Diagnosis not present

## 2022-09-01 MED ORDER — OMEPRAZOLE 20 MG CAPSULE,DELAYED RELEASE
ORAL_CAPSULE | Freq: Every day | ORAL | 11 refills | 30 days
Start: 2022-09-01 — End: 2023-09-01

## 2022-09-01 NOTE — Unmapped (Signed)
[facility] Refill Coordination Note    Specialty Medication(s) to be Shipped:   Transplant:  mycophenolic acid 180mg  and tacrolimus 1mg     Other medication(s) to be shipped:  metoprolol, pen needle, omeprazole     Benjamin Maldonado, DOB: [birth year]  Phone: [phone] (home)       All above HIPAA information was verified with patient.     Was a Nurse, learning disability used for this call? No    Completed refill call assessment today to schedule patient's medication shipment from the [facility] [phone]).  All relevant notes have been reviewed.     Specialty medication(s) and dose(s) confirmed: Regimen is correct and unchanged.   Changes to medications: [name] reports no changes at this time.  Changes to insurance: No  New side effects reported not previously addressed with a pharmacist or physician: None reported  Questions for the pharmacist: No    Confirmed patient received a Conservation officer, historic buildings and a Surveyor, mining with first shipment. The patient will receive a drug information handout for each medication shipped and additional FDA Medication Guides as required.       DISEASE/MEDICATION-SPECIFIC INFORMATION        N/A    SPECIALTY MEDICATION ADHERENCE     Medication Adherence    Patient reported X missed doses in the last month: 2  Specialty Medication: tacrolimus 1mg   Patient is on additional specialty medications: Yes  Additional Specialty Medications: Mycophenolate 180mg   Patient Reported Additional Medication X Missed Doses in the Last Month: 0  Patient is on more than two specialty medications: No  Adherence tools used: patient uses a pill box to manage medications  Support network for adherence: family member              Were doses missed due to medication being on hold? No    Tacrolimus 1 mg: 13 days of medicine on hand   Mycophenolate 180 mg: 13 days of medicine on hand     REFERRAL TO PHARMACIST     Referral to the pharmacist: Not needed      SHIPPING     Shipping address confirmed in Epic.       Delivery Scheduled: Yes, Expected medication delivery date: 09/08/22.     Medication will be delivered via UPS to the prescription address in Epic [place].    Tera Helper, [facility]   [facility] Specialty Pharmacist

## 2022-09-02 MED ORDER — OMEPRAZOLE 20 MG CAPSULE,DELAYED RELEASE
ORAL_CAPSULE | Freq: Every day | ORAL | 11 refills | 30 days | Status: CP
Start: 2022-09-02 — End: 2023-09-02
  Filled 2022-09-07: qty 30, 30d supply, fill #0

## 2022-09-02 NOTE — Unmapped (Signed)
Pt request for RX Refill

## 2022-09-07 MED FILL — METOPROLOL TARTRATE 50 MG TABLET: ORAL | 30 days supply | Qty: 90 | Fill #1

## 2022-09-07 MED FILL — MYCOPHENOLATE SODIUM 180 MG TABLET,DELAYED RELEASE: ORAL | 30 days supply | Qty: 180 | Fill #3

## 2022-09-07 MED FILL — TACROLIMUS 1 MG CAPSULE, IMMEDIATE-RELEASE: ORAL | 30 days supply | Qty: 360 | Fill #6

## 2022-09-08 MED FILL — BD ULTRA-FINE NANO PEN NEEDLE 32 GAUGE X 5/32" (4 MM): 25 days supply | Qty: 100 | Fill #4

## 2022-09-29 MED ORDER — DORZOLAMIDE 22.3 MG-TIMOLOL 6.8 MG/ML EYE DROPS
Freq: Two times a day (BID) | OPHTHALMIC | 12 refills | 100 days | Status: CP
Start: 2022-09-29 — End: ?
  Filled 2022-10-04: qty 10, 100d supply, fill #0

## 2022-09-29 NOTE — Unmapped (Signed)
[facility] Refill Coordination Note    Specialty Medication(s) to be Shipped:   Transplant: mycophenolate mofetil 180mg  and tacrolimus 1mg     Other medication(s) to be shipped:  dorozolomide-timolol,metoprolol,omeprazole,     Benjamin Maldonado, DOB: 06-Aug-1975  Phone: [phone] (home)       All above HIPAA information was verified with patient.     Was a Nurse, learning disability used for this call? No    Completed refill call assessment today to schedule patient's medication shipment from the [facility] [phone]).  All relevant notes have been reviewed.     Specialty medication(s) and dose(s) confirmed: Regimen is correct and unchanged.   Changes to medications: Trip reports no changes at this time.  Changes to insurance: No  New side effects reported not previously addressed with a pharmacist or physician: None reported  Questions for the pharmacist: No    Confirmed patient received a Conservation officer, historic buildings and a Surveyor, mining with first shipment. The patient will receive a drug information handout for each medication shipped and additional FDA Medication Guides as required.       DISEASE/MEDICATION-SPECIFIC INFORMATION        N/A    SPECIALTY MEDICATION ADHERENCE     Medication Adherence    Patient reported X missed doses in the last month: 1  Specialty Medication: mycophenolate 180 MG EC tablet (MYFORTIC)  Patient is on additional specialty medications: Yes  Additional Specialty Medications: tacrolimus 1 MG capsule (PROGRAF)  Patient Reported Additional Medication X Missed Doses in the Last Month: 0  Patient is on more than two specialty medications: No  Any gaps in refill history greater than 2 weeks in the last 3 months: no  Demonstrates understanding of importance of adherence: yes  Informant: patient  Provider-estimated medication adherence level: good  Patient is at risk for Non-Adherence: No  Reasons for non-adherence: no problems identified  Adherence tools used: patient uses a pill box to manage medications  Support network for adherence: family member  Confirmed plan for next specialty medication refill: delivery by pharmacy  Refills needed for supportive medications: not needed          Refill Coordination    Has the Patients' Contact Information Changed: No  Is the Shipping Address Different: No         Were doses missed due to medication being on hold? No    Mycophenolate  180 mg: 8 days of medicine on hand   tacrolimus 1 mg: 8 days of medicine on hand       REFERRAL TO PHARMACIST     Referral to the pharmacist: Not needed      [facility]     Shipping address confirmed in Epic.       Delivery Scheduled: Yes, Expected medication delivery date: 06/18.     Medication will be delivered via UPS to the prescription address in Epic WAM.    Antonietta Barcelona   [facility] Specialty Technician

## 2022-10-01 DIAGNOSIS — I129 Hypertensive chronic kidney disease with stage 1 through stage 4 chronic kidney disease, or unspecified chronic kidney disease: Secondary | ICD-10-CM | POA: Diagnosis not present

## 2022-10-01 DIAGNOSIS — E1129 Type 2 diabetes mellitus with other diabetic kidney complication: Secondary | ICD-10-CM | POA: Diagnosis not present

## 2022-10-01 DIAGNOSIS — Z940 Kidney transplant status: Secondary | ICD-10-CM | POA: Diagnosis not present

## 2022-10-01 DIAGNOSIS — H409 Unspecified glaucoma: Secondary | ICD-10-CM | POA: Diagnosis not present

## 2022-10-01 DIAGNOSIS — H5440 Blindness, one eye, unspecified eye: Secondary | ICD-10-CM | POA: Diagnosis not present

## 2022-10-04 MED FILL — OMEPRAZOLE 20 MG CAPSULE,DELAYED RELEASE: ORAL | 30 days supply | Qty: 30 | Fill #1

## 2022-10-04 MED FILL — TACROLIMUS 1 MG CAPSULE, IMMEDIATE-RELEASE: ORAL | 30 days supply | Qty: 360 | Fill #7

## 2022-10-04 MED FILL — MYCOPHENOLATE SODIUM 180 MG TABLET,DELAYED RELEASE: ORAL | 30 days supply | Qty: 180 | Fill #4

## 2022-10-04 MED FILL — METOPROLOL TARTRATE 50 MG TABLET: ORAL | 30 days supply | Qty: 90 | Fill #2

## 2022-10-08 DIAGNOSIS — H2522 Age-related cataract, morgagnian type, left eye: Secondary | ICD-10-CM | POA: Diagnosis not present

## 2022-10-25 DIAGNOSIS — Z940 Kidney transplant status: Secondary | ICD-10-CM | POA: Diagnosis not present

## 2022-10-25 DIAGNOSIS — E103593 Type 1 diabetes mellitus with proliferative diabetic retinopathy without macular edema, bilateral: Secondary | ICD-10-CM | POA: Diagnosis not present

## 2022-10-25 DIAGNOSIS — I10 Essential (primary) hypertension: Secondary | ICD-10-CM | POA: Diagnosis not present

## 2022-10-25 DIAGNOSIS — E1036 Type 1 diabetes mellitus with diabetic cataract: Secondary | ICD-10-CM | POA: Diagnosis not present

## 2022-10-25 DIAGNOSIS — Z89512 Acquired absence of left leg below knee: Secondary | ICD-10-CM | POA: Diagnosis not present

## 2022-10-25 DIAGNOSIS — N289 Disorder of kidney and ureter, unspecified: Secondary | ICD-10-CM | POA: Diagnosis not present

## 2022-10-25 DIAGNOSIS — D849 Immunodeficiency, unspecified: Secondary | ICD-10-CM | POA: Diagnosis not present

## 2022-10-26 NOTE — Unmapped (Addendum)
Spoke to patient about changing their accord tacrolimus to Sandoz.  Told the patient to finish the old mfg then start the new one.  Told patient to give the clinic a call to schedule labs when they start the new sandoz brand.  Pt acknowledged understanding.  dcr          [facility] Refill Coordination Note    Specialty Medication(s) to be Shipped:   Transplant: mycophenolate mofetil 180mg  and tacrolimus 1mg     Other medication(s) to be shipped:  metoprolol and omeprazole     Benjamin Maldonado, DOB: [birth year]  Phone: [phone] (home)       All above HIPAA information was verified with patient.     Was a Nurse, learning disability used for this call? No    Completed refill call assessment today to schedule patient's medication shipment from the [facility] [phone]).  All relevant notes have been reviewed.     Specialty medication(s) and dose(s) confirmed: Regimen is correct and unchanged.   Changes to medications: Benjamin Maldonado reports no changes at this time.  Changes to insurance: No  New side effects reported not previously addressed with a pharmacist or physician: None reported  Questions for the pharmacist: No    Confirmed patient received a Conservation officer, historic buildings and a Surveyor, mining with first shipment. The patient will receive a drug information handout for each medication shipped and additional FDA Medication Guides as required.       DISEASE/MEDICATION-SPECIFIC INFORMATION        N/A    SPECIALTY MEDICATION ADHERENCE     Medication Adherence    Patient reported X missed doses in the last month: 1  Specialty Medication: mycophenolate 180 MG EC tablet (MYFORTIC)  Patient is on additional specialty medications: Yes  Additional Specialty Medications: tacrolimus 1 MG capsule (PROGRAF)  Patient Reported Additional Medication X Missed Doses in the Last Month: 1  Patient is on more than two specialty medications: No  Adherence tools used: patient uses a pill box to manage medications  Support network for adherence: family member              Were doses missed due to medication being on hold? No    mycophenolate 180 mg: 7 days of medicine on hand   tacrolimus 1 mg: 7 days of medicine on hand        REFERRAL TO PHARMACIST     Referral to the pharmacist: Not needed      [facility]     Shipping address confirmed in Epic.       Delivery Scheduled: Yes, Expected medication delivery date: 10/29/22.     Medication will be delivered via UPS to the prescription address in Epic WAM.    Benjamin Maldonado   [facility] Specialty Technician

## 2022-10-26 NOTE — Unmapped (Signed)
[facility] Clinical Intervention    Type of intervention: Narrow therapeutic index drug    Medication involved: Tacrolimus    Problem identified: Pt has been taking accord tacrolimus and we only stock sandoz    Intervention performed: Spoke to patient about changing their accord tacrolimus to Sandoz.  Told the patient to finish the old mfg then start the new one.  Told patient to give the clinic a call to schedule labs when they start the new sandoz brand. Pt acknowledged understanding.      Follow-up needed: Pt will contact clinic to schedule labs when starting the new mfg.    Approximate time spent: 5-10 minutes    Clinical evidence used to support intervention: FDA Orange Book    Result of the intervention: Prevention of an adverse drug event    Haleem Hanner C Annalese Stiner, RPH   [facility] Specialty Pharmacist

## 2022-10-28 MED FILL — TACROLIMUS 1 MG CAPSULE, IMMEDIATE-RELEASE: ORAL | 30 days supply | Qty: 360 | Fill #8

## 2022-10-28 MED FILL — METOPROLOL TARTRATE 50 MG TABLET: ORAL | 30 days supply | Qty: 90 | Fill #3

## 2022-10-28 MED FILL — MYCOPHENOLATE SODIUM 180 MG TABLET,DELAYED RELEASE: ORAL | 30 days supply | Qty: 180 | Fill #5

## 2022-10-28 MED FILL — OMEPRAZOLE 20 MG CAPSULE,DELAYED RELEASE: ORAL | 30 days supply | Qty: 30 | Fill #2

## 2022-11-10 DIAGNOSIS — H25812 Combined forms of age-related cataract, left eye: Secondary | ICD-10-CM | POA: Diagnosis not present

## 2022-11-10 DIAGNOSIS — H2522 Age-related cataract, morgagnian type, left eye: Secondary | ICD-10-CM | POA: Diagnosis not present

## 2022-11-10 DIAGNOSIS — H21562 Pupillary abnormality, left eye: Secondary | ICD-10-CM | POA: Diagnosis not present

## 2022-11-10 DIAGNOSIS — H268 Other specified cataract: Secondary | ICD-10-CM | POA: Diagnosis not present

## 2022-11-10 NOTE — Unmapped (Signed)
Patient called to say he had cataract surgery today and will need to reschedule his apt.    Will reschedule annual apt

## 2022-11-15 NOTE — Unmapped (Signed)
Update TRF

## 2022-11-19 DIAGNOSIS — Z89512 Acquired absence of left leg below knee: Secondary | ICD-10-CM | POA: Diagnosis not present

## 2022-11-22 MED ORDER — LANTUS SOLOSTAR U-100 INSULIN 100 UNIT/ML (3 ML) SUBCUTANEOUS PEN
Freq: Every evening | SUBCUTANEOUS | 5 refills | 93.00000 days | Status: CN
Start: 2022-11-22 — End: ?

## 2022-11-22 NOTE — Unmapped (Signed)
In error

## 2022-11-22 NOTE — Unmapped (Signed)
[facility] Clinical Assessment & Refill Coordination Note    Benjamin Maldonado, DOB: 12/22/1975  Phone: [phone] (home)     All above HIPAA information was verified with patient.     Was a Nurse, learning disability used for this call? No    Specialty Medication(s):   Transplant:  mycophenolic acid 180mg  and tacrolimus 1mg      Current Outpatient Medications   Medication Sig Dispense Refill    insulin glargine (LANTUS SOLOSTAR U-100 INSULIN) 100 unit/mL (3 mL) injection pen Inject 0.16 mL (16 Units total) under the skin nightly. (Patient taking differently: Inject under the skin nightly. Alternating 20 and 23units nightly) 15 mL 5    acetaminophen (TYLENOL) 325 MG tablet Take 2 tablets (650 mg total) by mouth every six (6) hours as needed for pain. 100 tablet 2    aspirin (ADULT LOW DOSE ASPIRIN) 81 MG tablet Take 1 tablet (81 mg total) by mouth daily. 30 tablet 11    atorvastatin (LIPITOR) 20 MG tablet Take 1 tablet (20 mg total) by mouth daily. 90 tablet 3    blood sugar diagnostic Strp by Other route Four (4) times a day (before meals and nightly). One Touch 100 strip 5    blood-glucose meter (GLUCOSE MONITORING KIT) kit Use as instructed (Patient not taking: Reported on 07/15/2021) 1 each 0    dorzolamide-timolol (COSOPT) 22.3-6.8 mg/mL ophthalmic solution Administer 1 drop into the left eye Two (2) times a day. 10 mL 12    GLUC.METER,DIS.P-LOADED STRIPS (GLUCOSE METER, DISP & STRIPS MISC) Frequency:PHARMDIR   Dosage:0.0     Instructions:  Note:250.00, life long Dose: 1      insulin aspart (NOVOLOG FLEXPEN U-100 INSULIN) 100 unit/mL (3 mL) injection pen Inject 4 units under skin with breakfast, 8 units with lunch, and 6 units with supper and sliding scale. Max units 54 per day. 30 mL 11    magnesium oxide (MAG-OX) 400 mg (241.3 mg magnesium) tablet Take 1 tablet (400 mg total) by mouth.      metoPROLOL tartrate (LOPRESSOR) 50 MG tablet Take 1 and 1/2 tablets (75 mg total) by mouth Two (2) times a day. 90 tablet 11    mycophenolate (MYFORTIC) 180 MG EC tablet Take 3 tablets (540 mg total) by mouth two (2) times a day. 180 tablet 11    omeprazole (PRILOSEC) 20 MG capsule Take 1 capsule (20 mg total) by mouth daily. 30 capsule 11    tacrolimus (PROGRAF) 1 MG capsule Take 6 capsules (6 mg total) by mouth two (2) times a day. 360 capsule 11     No current facility-administered medications for this visit.        Changes to medications: Enoc reports no changes at this time.    Allergies   Allergen Reactions    Iodine Other (See Comments)    Iodine And Iodide Containing Products     Shellfish Containing Products Other (See Comments)       Changes to allergies: No    SPECIALTY MEDICATION ADHERENCE     Tacrolimus 1mg   : 7 days of medicine on hand   Mycophenolate 180mg   : 7 days of medicine on hand       Medication Adherence    Patient reported X missed doses in the last month: 0  Specialty Medication: tacrolimus 1mg   Patient is on additional specialty medications: Yes  Additional Specialty Medications: Mycophenolate 180mg   Patient Reported Additional Medication X Missed Doses in the Last Month: 0  Patient is on more than two specialty medications: No  Adherence tools used: patient uses a pill box to manage medications  Support network for adherence: family member          Specialty medication(s) dose(s) confirmed: Regimen is correct and unchanged.     Are there any concerns with adherence? No    Adherence counseling provided? Not needed    CLINICAL MANAGEMENT AND INTERVENTION      Clinical Benefit Assessment:    Do you feel the medicine is effective or helping your condition? Yes    Clinical Benefit counseling provided? Not needed    Adverse Effects Assessment:    Are you experiencing any side effects? No    Are you experiencing difficulty administering your medicine? No    Quality of Life Assessment:    Quality of Life    Rheumatology  Oncology  Dermatology  Cystic Fibrosis          How many days over the past month did your transplant  keep you from your normal activities? For example, brushing your teeth or getting up in the morning. 0    Have you discussed this with your provider? Not needed    Acute Infection Status:    Acute infections noted within Epic:  No active infections  Patient reported infection: None    Therapy Appropriateness:    Is therapy appropriate based on current medication list, adverse reactions, adherence, clinical benefit and progress toward achieving therapeutic goals? Yes, therapy is appropriate and should be continued     DISEASE/MEDICATION-SPECIFIC INFORMATION      N/A    Solid Organ Transplant: Not Applicable    PATIENT SPECIFIC NEEDS     Does the patient have any physical, cognitive, or cultural barriers? No    Is the patient high risk? Yes, patient is taking a REMS drug. Medication is dispensed in compliance with REMS program    Did the patient require a clinical intervention? No    Does the patient require physician intervention or other additional services (i.e., nutrition, smoking cessation, social work)? No    SOCIAL DETERMINANTS OF HEALTH     At the [facility], we have learned that life circumstances - like trouble affording food, housing, utilities, or transportation can affect the health of many of our patients.   That is why we wanted to ask: are you currently experiencing any life circumstances that are negatively impacting your health and/or quality of life? No    Social Determinants of Health     Financial Resource Strain: Not on file   Internet Connectivity: Not on file   Food Insecurity: Not on file   Tobacco Use: Low Risk  (08/15/2022)    Received from [facility]    Patient History     Smoking Tobacco Use: Never     Smokeless Tobacco Use: Never     Passive Exposure: Not on file   Housing/Utilities: Not on file   Alcohol Use: Not At Risk (12/18/2018)    Received from [facility] visits prior to 06/19/2022.    AUDIT-C     Frequency of Alcohol Consumption: Never     Average Number of Drinks: Not on file     Frequency of Binge Drinking: Not on file   Transportation Needs: Not on file   Substance Use: Not on file   Health Literacy: Not on file   Physical Activity: Not on file   Interpersonal Safety: Unknown (11/22/2022)  Interpersonal Safety     Unsafe Where You Currently Live: Not on file     Physically Hurt by Anyone: Not on file     Abused by Anyone: Not on file   Stress: Not on file   Intimate Partner Violence: Unknown (07/24/2021)    Received from [facility]    HITS     Physically Hurt: Not on file     Insult or Talk Down To: Not on file     Threaten Physical Harm: Not on file     Scream or Curse: Not on file   Depression: Not on file   Social Connections: Unknown (09/01/2021)    Received from [facility]    Social Network     Social Network: Not on file       Would you be willing to receive help with any of the needs that you have identified today? Not applicable       SHIPPING     Specialty Medication(s) to be Shipped:   Transplant:  mycophenolic acid 180mg  and tacrolimus 1mg     Other medication(s) to be shipped:  omeprazole, novolog, lantus, metoprolol,  atorvastatin     Changes to insurance: No    Delivery Scheduled: Yes, Expected medication delivery date: 11/25/2022.  However, Rx request for refills was sent to the provider as there are none remaining.     Medication will be delivered via UPS to the confirmed prescription address in [facility]    The patient will receive a drug information handout for each medication shipped and additional FDA Medication Guides as required.  Verified that patient has previously received a Conservation officer, historic buildings and a Surveyor, mining.    The patient or caregiver noted above participated in the development of this care plan and knows that they can request review of or adjustments to the care plan at any time.      All of the patient's questions and concerns have been addressed.    [name], PharmD   [facility] Specialty Pharmacist

## 2022-11-24 MED FILL — NOVOLOG FLEXPEN U-100 INSULIN ASPART 100 UNIT/ML (3 ML) SUBCUTANEOUS: SUBCUTANEOUS | 55 days supply | Qty: 30 | Fill #1

## 2022-11-24 MED FILL — OMEPRAZOLE 20 MG CAPSULE,DELAYED RELEASE: ORAL | 30 days supply | Qty: 30 | Fill #3

## 2022-11-24 MED FILL — ATORVASTATIN 20 MG TABLET: ORAL | 90 days supply | Qty: 90 | Fill #1

## 2022-11-24 MED FILL — MYCOPHENOLATE SODIUM 180 MG TABLET,DELAYED RELEASE: ORAL | 30 days supply | Qty: 180 | Fill #6

## 2022-11-24 MED FILL — TACROLIMUS 1 MG CAPSULE, IMMEDIATE-RELEASE: ORAL | 30 days supply | Qty: 360 | Fill #9

## 2022-11-24 MED FILL — METOPROLOL TARTRATE 50 MG TABLET: ORAL | 30 days supply | Qty: 90 | Fill #4

## 2022-12-02 DIAGNOSIS — H33192 Other retinoschisis and retinal cysts, left eye: Secondary | ICD-10-CM | POA: Diagnosis not present

## 2022-12-02 DIAGNOSIS — H35032 Hypertensive retinopathy, left eye: Secondary | ICD-10-CM | POA: Diagnosis not present

## 2022-12-02 DIAGNOSIS — H3582 Retinal ischemia: Secondary | ICD-10-CM | POA: Diagnosis not present

## 2022-12-02 DIAGNOSIS — H31092 Other chorioretinal scars, left eye: Secondary | ICD-10-CM | POA: Diagnosis not present

## 2022-12-02 DIAGNOSIS — E113522 Type 2 diabetes mellitus with proliferative diabetic retinopathy with traction retinal detachment involving the macula, left eye: Secondary | ICD-10-CM | POA: Diagnosis not present

## 2022-12-16 NOTE — Unmapped (Signed)
[facility] Refill Coordination Note    Specialty Medication(s) to be Shipped:   Transplant: mycophenolate mofetil 180 mg and tacrolimus 1mg     Other medication(s) to be shipped:  metoprolol and omeprazole     Benjamin Maldonado, DOB: 1975/05/21  Phone: [phone] (home)       All above HIPAA information was verified with patient.     Was a Nurse, learning disability used for this call? No    Completed refill call assessment today to schedule patient's medication shipment from the [facility] [phone]).  All relevant notes have been reviewed.     Specialty medication(s) and dose(s) confirmed: Regimen is correct and unchanged.   Changes to medications: [name] reports no changes at this time.  Changes to insurance: No  New side effects reported not previously addressed with a pharmacist or physician: None reported  Questions for the pharmacist: No    Confirmed patient received a Conservation officer, historic buildings and a Surveyor, mining with first shipment. The patient will receive a drug information handout for each medication shipped and additional FDA Medication Guides as required.       DISEASE/MEDICATION-SPECIFIC INFORMATION        N/A    SPECIALTY MEDICATION ADHERENCE     Medication Adherence    Patient reported X missed doses in the last month: 0  Specialty Medication: mycophenolate 180 MG EC tablet (MYFORTIC)  Patient is on additional specialty medications: Yes  Additional Specialty Medications: tacrolimus 1 MG capsule (PROGRAF)  Patient Reported Additional Medication X Missed Doses in the Last Month: 0  Adherence tools used: patient uses a pill box to manage medications  Support network for adherence: family member              Were doses missed due to medication being on hold? No      mycophenolate 180 MG EC tablet (MYFORTIC): 13 days of medicine on hand     tacrolimus 1 MG capsule (PROGRAF): 13 days of medicine on hand       REFERRAL TO PHARMACIST     Referral to the pharmacist: Not needed      [facility]     Shipping address confirmed in Epic.       Delivery Scheduled: Yes, Expected medication delivery date: 12/24/2022.     Medication will be delivered via UPS to the prescription address in Epic [place].    Benjamin Maldonado   [facility] Specialty Technician

## 2022-12-23 MED FILL — METOPROLOL TARTRATE 50 MG TABLET: ORAL | 30 days supply | Qty: 90 | Fill #5

## 2022-12-23 MED FILL — MYCOPHENOLATE SODIUM 180 MG TABLET,DELAYED RELEASE: ORAL | 30 days supply | Qty: 180 | Fill #7

## 2022-12-23 MED FILL — OMEPRAZOLE 20 MG CAPSULE,DELAYED RELEASE: ORAL | 30 days supply | Qty: 30 | Fill #4

## 2022-12-23 MED FILL — TACROLIMUS 1 MG CAPSULE, IMMEDIATE-RELEASE: ORAL | 30 days supply | Qty: 360 | Fill #10

## 2022-12-30 DIAGNOSIS — H401121 Primary open-angle glaucoma, left eye, mild stage: Secondary | ICD-10-CM | POA: Diagnosis not present

## 2023-01-13 DIAGNOSIS — Z794 Long term (current) use of insulin: Principal | ICD-10-CM

## 2023-01-13 DIAGNOSIS — E1165 Type 2 diabetes mellitus with hyperglycemia: Principal | ICD-10-CM

## 2023-01-13 DIAGNOSIS — Z940 Kidney transplant status: Principal | ICD-10-CM

## 2023-01-13 MED ORDER — PEN NEEDLE, DIABETIC 32 GAUGE X 5/32" (4 MM)
PRN refills | 0 days
Start: 2023-01-13 — End: 2024-01-13

## 2023-01-13 NOTE — Unmapped (Signed)
Patient was notified of operational disruptions. Patient opted to: schedule their refill with understanding of potential delay until 10/1 or later. This was facilitated by pharmacy staff     [facility] Refill Coordination Note    Specialty Medication(s) to be Shipped:   Transplant: mycophenolate mofetil 180 mg and tacrolimus 1mg     Other medication(s) to be shipped:  metoprolol,omeprazole,novolog , pen needles     Benjamin Maldonado, DOB: [birth year]  Phone: [phone] (home)       All above HIPAA information was verified with patient.     Was a Nurse, learning disability used for this call? No    Completed refill call assessment today to schedule patient's medication shipment from the [facility]  [phone]).  All relevant notes have been reviewed.     Specialty medication(s) and dose(s) confirmed: Regimen is correct and unchanged.   Changes to medications: Veronica reports no changes at this time.  Changes to insurance: No  New side effects reported not previously addressed with a pharmacist or physician: None reported  Questions for the pharmacist: No    Confirmed patient received a Conservation officer, historic buildings and a Surveyor, mining with first shipment. The patient will receive a drug information handout for each medication shipped and additional FDA Medication Guides as required.       DISEASE/MEDICATION-SPECIFIC INFORMATION        N/A    SPECIALTY MEDICATION ADHERENCE     Medication Adherence    Patient reported X missed doses in the last month: 0  Specialty Medication: tacrolimus 1 MG capsule (PROGRAF)  Patient is on additional specialty medications: Yes  Additional Specialty Medications: mycophenolate 180 MG EC tablet (MYFORTIC)  Patient Reported Additional Medication X Missed Doses in the Last Month: 0  Patient is on more than two specialty medications: No  Adherence tools used: patient uses a pill box to manage medications  Support network for adherence: family member              Were doses missed due to medication being on hold? No      tacrolimus 1 MG capsule (PROGRAF): 14 days of medicine on hand     mycophenolate 180 MG EC tablet (MYFORTIC): 14 days of medicine on hand       REFERRAL TO PHARMACIST     Referral to the pharmacist: Not needed      [facility]     Shipping address confirmed in Epic.       Delivery Scheduled: Yes, Expected medication delivery date: 01/24/2023.     Medication will be delivered via UPS to the prescription address in Epic WAM.    [facility]  Specialty Technician

## 2023-01-14 MED ORDER — PEN NEEDLE, DIABETIC 32 GAUGE X 5/32" (4 MM)
PRN refills | 0 days | Status: CP
Start: 2023-01-14 — End: 2024-01-14
  Filled 2023-01-24: qty 100, 25d supply, fill #0

## 2023-01-14 NOTE — Unmapped (Signed)
The patient is requesting a medication refill

## 2023-01-24 MED FILL — MYCOPHENOLATE SODIUM 180 MG TABLET,DELAYED RELEASE: ORAL | 30 days supply | Qty: 180 | Fill #8

## 2023-01-24 MED FILL — TACROLIMUS 1 MG CAPSULE, IMMEDIATE-RELEASE: ORAL | 30 days supply | Qty: 360 | Fill #11

## 2023-01-24 MED FILL — OMEPRAZOLE 20 MG CAPSULE,DELAYED RELEASE: ORAL | 30 days supply | Qty: 30 | Fill #5

## 2023-01-24 MED FILL — NOVOLOG FLEXPEN U-100 INSULIN ASPART 100 UNIT/ML (3 ML) SUBCUTANEOUS: SUBCUTANEOUS | 55 days supply | Qty: 30 | Fill #2

## 2023-01-24 MED FILL — METOPROLOL TARTRATE 50 MG TABLET: ORAL | 30 days supply | Qty: 90 | Fill #6

## 2023-02-01 NOTE — Unmapped (Signed)
Patient called to say that he will had to reschedule his apt    He did establish care with Dr Signe Colt. Dr Signe Colt will order imaging locally and monthly lab work    He will reschedule with Dr Margaretmary Bayley for annual apts and see Dr Signe Colt every few months

## 2023-02-02 DIAGNOSIS — Z0000 Encounter for general adult medical examination without abnormal findings: Secondary | ICD-10-CM | POA: Diagnosis not present

## 2023-02-02 DIAGNOSIS — I10 Essential (primary) hypertension: Secondary | ICD-10-CM | POA: Diagnosis not present

## 2023-02-02 DIAGNOSIS — Z89511 Acquired absence of right leg below knee: Secondary | ICD-10-CM | POA: Diagnosis not present

## 2023-02-02 DIAGNOSIS — E103593 Type 1 diabetes mellitus with proliferative diabetic retinopathy without macular edema, bilateral: Secondary | ICD-10-CM | POA: Diagnosis not present

## 2023-02-02 DIAGNOSIS — D849 Immunodeficiency, unspecified: Secondary | ICD-10-CM | POA: Diagnosis not present

## 2023-02-02 DIAGNOSIS — Z794 Long term (current) use of insulin: Secondary | ICD-10-CM | POA: Diagnosis not present

## 2023-02-02 DIAGNOSIS — Z23 Encounter for immunization: Secondary | ICD-10-CM | POA: Diagnosis not present

## 2023-02-02 DIAGNOSIS — Z940 Kidney transplant status: Secondary | ICD-10-CM | POA: Diagnosis not present

## 2023-02-02 DIAGNOSIS — Z1331 Encounter for screening for depression: Secondary | ICD-10-CM | POA: Diagnosis not present

## 2023-02-15 ENCOUNTER — Ambulatory Visit (INDEPENDENT_AMBULATORY_CARE_PROVIDER_SITE_OTHER): Payer: Medicare PPO | Admitting: Podiatry

## 2023-02-15 DIAGNOSIS — L84 Corns and callosities: Secondary | ICD-10-CM | POA: Diagnosis not present

## 2023-02-15 DIAGNOSIS — M79674 Pain in right toe(s): Secondary | ICD-10-CM | POA: Diagnosis not present

## 2023-02-15 DIAGNOSIS — E1142 Type 2 diabetes mellitus with diabetic polyneuropathy: Secondary | ICD-10-CM

## 2023-02-15 DIAGNOSIS — B351 Tinea unguium: Secondary | ICD-10-CM | POA: Diagnosis not present

## 2023-02-15 NOTE — Patient Instructions (Signed)
Look for urea 40% cream or ointment and apply to the thickened dry skin / calluses. This can be bought over the counter, at a pharmacy or online such as [employer].  

## 2023-02-15 NOTE — Progress Notes (Signed)
Subjective:  Patient ID: Anthony Blanchard, male    DOB: 1976/04/01,  MRN: [id number]  Chief Complaint  Patient presents with   Nail Problem    Below knee amputation of left leg. C/o toenail fungus on right foot and callus. Diabetic patient, last A1c 12.1.     Discussed the use of AI scribe software for clinical note transcription with the patient, who gave verbal consent to proceed.  History of Present Illness   The patient, with a history of diabetes and a below knee amputation on the left side, presents for a foot care visit. He reports a callus on the bottom of his right foot and a recent bump from hitting his foot on a scooter at home. He has been managing the callus with Vaseline and lotion every other day. His diabetes has been fluctuating, with a recent A1c that initially decreased but then increased again. The patient is unsure of his current A1c level, but he was told it was high at his last check. He also reports trying to trim his toenails, but not too deeply, to avoid causing a wound.          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 Right foot is warm with a palpable dorsalis pedis pulse and weakly palpable posterior tibial pulse. SKIN: Diffuse callus submetatarsal area without pre-ulceration. Thick and elongated dystrophic mycotic nails.  Healing scab posterior heel       No images are attached to the encounter.    Results   Nails x10 debrided in length and thickness with a sharp nail nipper to a comfortable level.   Callus debrided in thickness with a sharp scalpel      Assessment:   1. Pain due to onychomycosis of toenail of right foot   2. Callus of foot   3. Diabetic polyneuropathy associated with type 2 diabetes mellitus (HCC)      Plan:  Patient was evaluated and treated and all questions answered.  Assessment and Plan    Diabetic Foot Care   He exhibits callus formation on the right foot,  with no signs of ulceration or infection, and dystrophic mycotic nails, alongside diffuse polyneuropathy. We will debride and manage his toenails regularly, use a pumice stone daily for callus management, and apply urea 40% cream daily to soften dead skin. Regular appointments for diabetic foot care are advisable. Doubt topical antifungals will be effective and not a good candidate for oral therapy          Return in about 3 months (around 05/18/2023) for at risk diabetic foot care.

## 2023-02-24 DIAGNOSIS — D849 Immunodeficiency, unspecified: Secondary | ICD-10-CM | POA: Diagnosis not present

## 2023-02-24 DIAGNOSIS — H5440 Blindness, one eye, unspecified eye: Secondary | ICD-10-CM | POA: Diagnosis not present

## 2023-02-24 DIAGNOSIS — E1129 Type 2 diabetes mellitus with other diabetic kidney complication: Secondary | ICD-10-CM | POA: Diagnosis not present

## 2023-02-24 DIAGNOSIS — Z940 Kidney transplant status: Secondary | ICD-10-CM | POA: Diagnosis not present

## 2023-02-24 DIAGNOSIS — E785 Hyperlipidemia, unspecified: Secondary | ICD-10-CM | POA: Diagnosis not present

## 2023-02-24 DIAGNOSIS — I129 Hypertensive chronic kidney disease with stage 1 through stage 4 chronic kidney disease, or unspecified chronic kidney disease: Secondary | ICD-10-CM | POA: Diagnosis not present

## 2023-03-04 ENCOUNTER — Other Ambulatory Visit: Payer: Self-pay

## 2023-03-04 DIAGNOSIS — E1165 Type 2 diabetes mellitus with hyperglycemia: Secondary | ICD-10-CM

## 2023-03-04 NOTE — Unmapped (Signed)
The [facility] has made a second and final attempt to reach this patient to refill the following medication:tacrolimus, mycophenolate.      We have been unable to leave messages on the following phone numbers: [phone] .    Dates contacted: 11/6, 11/14  Last scheduled delivery: 01/24/23    The patient may be at risk of non-compliance with this medication. The patient should call the [facility] at [phone]  Option 4, then Option 4: Infectious Disease, Transplant to refill medication.    Tera Helper, [facility]   [facility] Specialty Pharmacist

## 2023-03-07 DIAGNOSIS — Z940 Kidney transplant status: Principal | ICD-10-CM

## 2023-03-07 MED ORDER — INSULIN ASPART (U-100) 100 UNIT/ML (3 ML) SUBCUTANEOUS PEN
Freq: Every day | SUBCUTANEOUS | 11 refills | 55 days | Status: CP
Start: 2023-03-07 — End: ?

## 2023-03-07 MED ORDER — TACROLIMUS 1 MG CAPSULE, IMMEDIATE-RELEASE
ORAL_CAPSULE | Freq: Two times a day (BID) | ORAL | 11 refills | 30 days | Status: CP
Start: 2023-03-07 — End: 2024-03-06
  Filled 2023-03-09: qty 360, 30d supply, fill #0

## 2023-03-07 MED ORDER — LANTUS SOLOSTAR U-100 INSULIN 100 UNIT/ML (3 ML) SUBCUTANEOUS PEN
Freq: Every evening | SUBCUTANEOUS | 5 refills | 93 days
Start: 2023-03-07 — End: ?

## 2023-03-07 NOTE — Unmapped (Signed)
[facility] Refill Coordination Note    Specialty Medication(s) to be Shipped:   Transplant: mycophenolate mofetil 180 mg and tacrolimus 1mg     Other medication(s) to be shipped:  BD Pen Needle, Metoprolol Tartrate 50 mg, Novolog Flexpen , Lantus Solostar, Omeprazole 20 mg,      Benjamin Maldonado, DOB: [birth year]  Phone: [phone] (home)       All above HIPAA information was verified with patient.     Was a Nurse, learning disability used for this call? No    Completed refill call assessment today to schedule patient's medication shipment from the [facility]  [phone]).  All relevant notes have been reviewed.     Specialty medication(s) and dose(s) confirmed: Regimen is correct and unchanged.   Changes to medications: Benjamin Maldonado reports no changes at this time.  Changes to insurance: No  New side effects reported not previously addressed with a pharmacist or physician: None reported  Questions for the pharmacist: No    Confirmed patient received a Conservation officer, historic buildings and a Surveyor, mining with first shipment. The patient will receive a drug information handout for each medication shipped and additional FDA Medication Guides as required.       DISEASE/MEDICATION-SPECIFIC INFORMATION        N/A    SPECIALTY MEDICATION ADHERENCE     Medication Adherence    Patient reported X missed doses in the last month: 1  Specialty Medication: mycophenolate 180 MG EC tablet (MYFORTIC)  Patient is on additional specialty medications: Yes  Additional Specialty Medications: tacrolimus 1 MG capsule (PROGRAF)  Patient Reported Additional Medication X Missed Doses in the Last Month: 1  Adherence tools used: patient uses a pill box to manage medications  Support network for adherence: family member              Were doses missed due to medication being on hold? No    Mycophenolate 180 mg: 8 days of medicine on hand   Tacrolimus 1 mg: 8  days  of medicine on hand        REFERRAL TO PHARMACIST     Referral to the pharmacist: Not needed      [facility]     Shipping address confirmed in Epic.       Delivery Scheduled: Yes, Expected medication delivery date: 03/10/23.  However, Rx request for refills was sent to the provider as there are none remaining.     Medication will be delivered via UPS to the prescription address in Epic WAM.    Willette Pa   [facility]  Specialty Technician

## 2023-03-09 MED ORDER — LANTUS SOLOSTAR U-100 INSULIN 100 UNIT/ML (3 ML) SUBCUTANEOUS PEN
Freq: Every evening | SUBCUTANEOUS | 5 refills | 93 days | Status: CP
Start: 2023-03-09 — End: ?
  Filled 2023-03-14: qty 30, 55d supply, fill #0

## 2023-03-09 MED FILL — BD ULTRA-FINE NANO PEN NEEDLE 32 GAUGE X 5/32" (4 MM): 25 days supply | Qty: 100 | Fill #1

## 2023-03-09 MED FILL — OMEPRAZOLE 20 MG CAPSULE,DELAYED RELEASE: ORAL | 30 days supply | Qty: 30 | Fill #6

## 2023-03-09 MED FILL — MYCOPHENOLATE SODIUM 180 MG TABLET,DELAYED RELEASE: ORAL | 30 days supply | Qty: 180 | Fill #9

## 2023-03-09 MED FILL — METOPROLOL TARTRATE 50 MG TABLET: ORAL | 30 days supply | Qty: 90 | Fill #7

## 2023-03-10 DIAGNOSIS — I10 Essential (primary) hypertension: Secondary | ICD-10-CM | POA: Diagnosis not present

## 2023-03-10 DIAGNOSIS — Z23 Encounter for immunization: Secondary | ICD-10-CM | POA: Diagnosis not present

## 2023-03-10 DIAGNOSIS — D84821 Immunodeficiency due to drugs: Secondary | ICD-10-CM | POA: Diagnosis not present

## 2023-03-10 DIAGNOSIS — G4733 Obstructive sleep apnea (adult) (pediatric): Secondary | ICD-10-CM | POA: Diagnosis not present

## 2023-03-10 DIAGNOSIS — E103593 Type 1 diabetes mellitus with proliferative diabetic retinopathy without macular edema, bilateral: Secondary | ICD-10-CM | POA: Diagnosis not present

## 2023-03-11 ENCOUNTER — Other Ambulatory Visit: Payer: Medicare PPO

## 2023-03-11 ENCOUNTER — Other Ambulatory Visit (INDEPENDENT_AMBULATORY_CARE_PROVIDER_SITE_OTHER): Payer: Medicare PPO

## 2023-03-11 DIAGNOSIS — E1165 Type 2 diabetes mellitus with hyperglycemia: Secondary | ICD-10-CM | POA: Diagnosis not present

## 2023-03-11 LAB — COMPREHENSIVE METABOLIC PANEL
ALT: 13 U/L (ref 0–53)
AST: 16 U/L (ref 0–37)
Albumin: 3.9 g/dL (ref 3.5–5.2)
Alkaline Phosphatase: 86 U/L (ref 39–117)
BUN: 30 mg/dL — ABNORMAL HIGH (ref 6–23)
CO2: 24 meq/L (ref 19–32)
Calcium: 9.3 mg/dL (ref 8.4–10.5)
Chloride: 104 meq/L (ref 96–112)
Creatinine, Ser: 1.57 mg/dL — ABNORMAL HIGH (ref 0.40–1.50)
GFR: 52.35 mL/min — ABNORMAL LOW (ref >60.00)
Glucose, Bld: 192 mg/dL — ABNORMAL HIGH (ref 70–99)
Potassium: 4.4 meq/L (ref 3.5–5.1)
Sodium: 136 meq/L (ref 135–145)
Total Bilirubin: 0.7 mg/dL (ref 0.2–1.2)
Total Protein: 6.3 g/dL (ref 6.0–8.3)

## 2023-03-11 LAB — LIPID PANEL
Cholesterol: 133 mg/dL (ref 0–200)
HDL: 39.3 mg/dL (ref >39.00)
LDL Cholesterol: 72 mg/dL (ref 0–99)
NonHDL: 93.46
Total CHOL/HDL Ratio: 3
Triglycerides: 105 mg/dL (ref 0.0–149.0)
VLDL: 21 mg/dL (ref 0.0–40.0)

## 2023-03-11 LAB — HEMOGLOBIN A1C: Hgb A1c MFr Bld: 10.6 % — ABNORMAL HIGH (ref 4.6–6.5)

## 2023-03-15 ENCOUNTER — Other Ambulatory Visit: Payer: Self-pay | Admitting: Nephrology

## 2023-03-15 DIAGNOSIS — Z940 Kidney transplant status: Secondary | ICD-10-CM

## 2023-03-16 ENCOUNTER — Ambulatory Visit (INDEPENDENT_AMBULATORY_CARE_PROVIDER_SITE_OTHER): Payer: Medicare PPO | Admitting: "Endocrinology

## 2023-03-16 ENCOUNTER — Encounter: Payer: Self-pay | Admitting: "Endocrinology

## 2023-03-16 VITALS — BP 160/84 | HR 82 | Ht 69.0 in | Wt 250.8 lb

## 2023-03-16 DIAGNOSIS — E1165 Type 2 diabetes mellitus with hyperglycemia: Secondary | ICD-10-CM

## 2023-03-16 DIAGNOSIS — Z794 Long term (current) use of insulin: Secondary | ICD-10-CM

## 2023-03-16 MED ORDER — BLOOD GLUCOSE MONITORING SUPPL DEVI: 1.0000 | Freq: Three times a day (TID) | 0 refills | Status: AC

## 2023-03-16 MED ORDER — DEXCOM G7 SENSOR MISC: 1.0000 | 0 refills | Status: DC

## 2023-03-16 MED ORDER — LANCETS MISC. MISC: 1.0000 | Freq: Three times a day (TID) | 3 refills | Status: AC

## 2023-03-16 MED ORDER — LANCET DEVICE MISC: 1.0000 | Freq: Three times a day (TID) | 0 refills | Status: AC

## 2023-03-16 MED ORDER — BAQSIMI ONE PACK 3 MG/DOSE NA POWD: 1.0000 | NASAL | 3 refills | Status: AC | PRN

## 2023-03-16 MED ORDER — BLOOD GLUCOSE TEST VI STRP: 1.0000 | ORAL_STRIP | Freq: Three times a day (TID) | 3 refills | Status: AC

## 2023-03-16 NOTE — Progress Notes (Signed)
Outpatient Endocrinology Note [place], MD  03/16/23   Anthony Blanchard 02-20-1976 [id number]  Referring Provider: Bufford Buttner, MD Primary Care Provider: Lorenda Ishihara, MD Reason for consultation: Subjective   Assessment & Plan  Diagnoses and all orders for this visit:  Uncontrolled type 2 diabetes mellitus with hyperglycemia (HCC) -     Ambulatory referral to diabetic education  Long-term insulin use (HCC)  Other orders -     Continuous Glucose Sensor (DEXCOM G7 SENSOR) MISC; 1 Device by Does not apply route continuous. -     Blood Glucose Monitoring Suppl DEVI; 1 each by Does not apply route in the morning, at noon, and at bedtime. May substitute to any manufacturer covered by patient's insurance. -     Glucose Blood (BLOOD GLUCOSE TEST STRIPS) STRP; 1 each by In Vitro route in the morning, at noon, and at bedtime. May substitute to any manufacturer covered by patient's insurance. -     Lancet Device MISC; 1 each by Does not apply route in the morning, at noon, and at bedtime. May substitute to any manufacturer covered by patient's insurance. -     Lancets Misc. MISC; 1 each by Does not apply route in the morning, at noon, and at bedtime. May substitute to any manufacturer covered by patient's insurance. -     Glucagon (BAQSIMI ONE PACK) 3 MG/DOSE POWD; Place 1 Device into the nose as needed (Low blood sugar with impaired consciousness).   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  No known contraindications/side effects to any of above medications Glucagon discussed and prescribed with refills on 03/16/23 No history of MEN syndrome/medullary thyroid cancer/pancreatitis or pancreatic cancer in self or family  -Last LD and Tg are as follows: Lab Results  Component Value Date   LDLCALC 72 03/11/2023    Lab Results  Component Value Date   TRIG 105.0 03/11/2023   -On atorvastatin 40 mg QD -Follow low fat diet and exercise   -Blood pressure goal <140/90 - Microalbumin/creatinine goal is < 30 -Last MA/Cr is as follows: No results found for: "MICROALBUR", "MALB24HUR" -not on ACE/ARB  -diet changes including salt restriction -limit eating outside -counseled BP targets per standards of diabetes care -uncontrolled blood pressure can lead to retinopathy, nephropathy and cardiovascular and atherosclerotic heart disease  Reviewed and counseled on: -A1C target -Blood sugar targets -Complications of uncontrolled diabetes  -Checking blood sugar before meals and bedtime and bring log next visit -All medications with mechanism of action and side effects -Hypoglycemia management: rule of 15's, Glucagon Emergency Kit and medical alert ID -low-carb low-fat plate-method diet -At least 20 minutes of physical activity per day -Annual dilated retinal eye exam and foot exam -compliance and follow up needs -follow up as scheduled or earlier if problem gets worse  Call if blood sugar is less than 70 or consistently above 250    Take a  15 gm snack of carbohydrate at bedtime before you go to sleep if your blood sugar is less than 100.    If you are going to fast after midnight for a test or procedure, ask your physician for instructions on how to reduce/decrease your insulin dose.    Call if blood sugar is less than 70 or consistently above 250  -Treating a low sugar by rule of 15  (15 gms of sugar every 15 min until  sugar is more than 70) If you feel your sugar is low, test your sugar to be sure If your sugar is low (less than 70), then take 15 grams of a fast acting Carbohydrate (3-4 glucose tablets or glucose gel or 4 ounces of juice or regular soda) Recheck your sugar 15 min after treating low to make sure it is more than 70 If sugar is still less than 70, treat again with 15 grams of carbohydrate          Don't drive the hour of hypoglycemia  If unconscious/unable to eat or drink by mouth, use glucagon injection or nasal spray baqsimi and call 911. Can repeat again in 15 min if still unconscious.  Return in about 1 week (around 03/23/2023).   I have reviewed current medications, nurse's notes, allergies, vital signs, past medical and surgical history, family medical history, and social history for this encounter. Counseled patient on symptoms, examination findings, lab findings, imaging results, treatment decisions and monitoring and prognosis. The patient understood the recommendations and agrees with the treatment plan. All questions regarding treatment plan were fully answered.  Anthony [place], MD  03/16/23   History of Present Illness Anthony Blanchard is a 47 y.o. year old male who presents for evaluation of Type II diabetes mellitus.  Anthony Blanchard was first diagnosed at age 47.   Diabetes education +  Home diabetes regimen: Lantus 30 units at bedtime Novolog 6 units before break fast, 8 units before lunch, 6 units before dinner  COMPLICATIONS +  MI/Stroke +  retinopathy -  neuropathy +  nephropathy  SYMPTOMS REVIEWED + Polyuria - Weight loss - Blurred vision  BLOOD SUGAR DATA Doesn't check BG  Physical Exam  BP (!) 160/84   Pulse 82   Ht 5\' 9"  (1.753 m)   Wt 250 lb 12.8 oz (113.8 kg)   SpO2 97%   BMI 37.04 kg/m    Constitutional: well developed, well nourished Head: normocephalic, atraumatic Eyes: sclera anicteric, no redness Neck: supple Lungs: normal respiratory  effort Neurology: alert and oriented Skin: dry, no appreciable rashes Musculoskeletal: no appreciable defects Psychiatric: normal mood and affect Diabetic Foot Exam - Simple   Simple Foot Form Diabetic Foot exam was performed with the following findings: Yes 03/16/2023  1:56 PM  Visual Inspection See comments: Yes Sensation Testing See comments: Yes Pulse Check See comments: Yes Comments R foot 1/3 monofilament, DP +@ R foot Has L BKA       Current Medications Patient's Medications  New Prescriptions   BLOOD GLUCOSE MONITORING SUPPL DEVI    1 each by Does not apply route in the morning, at noon, and at bedtime. May substitute to any manufacturer covered by patient's insurance.   CONTINUOUS GLUCOSE SENSOR (DEXCOM G7 SENSOR) MISC    1 Device by Does not apply route continuous.   GLUCAGON (BAQSIMI ONE PACK) 3 MG/DOSE POWD    Place 1 Device into the nose as needed (Low blood sugar with impaired consciousness).   GLUCOSE BLOOD (  BLOOD GLUCOSE TEST STRIPS) STRP    1 each by In Vitro route in the morning, at noon, and at bedtime. May substitute to any manufacturer covered by patient's insurance.   LANCET DEVICE MISC    1 each by Does not apply route in the morning, at noon, and at bedtime. May substitute to any manufacturer covered by patient's insurance.   LANCETS MISC. MISC    1 each by Does not apply route in the morning, at noon, and at bedtime. May substitute to any manufacturer covered by patient's insurance.  Previous Medications   ACETAMINOPHEN (TYLENOL) 500 MG TABLET    Take 500 mg by mouth daily.   ASPIRIN EC 81 MG TABLET    Take 81 mg by mouth.   ATORVASTATIN (LIPITOR) 40 MG TABLET    Take 1 tablet (40 mg total) by mouth daily.   BLOOD GLUCOSE MONITORING SUPPL (FIFTY50 GLUCOSE METER 2.0) W/DEVICE KIT    Use as instructed   DORZOLAMIDE-TIMOLOL (COSOPT) 22.3-6.8 MG/ML OPHTHALMIC SOLUTION    Place 1 drop into the left eye 2 (two) times daily.    INSULIN GLARGINE (LANTUS) 100  UNIT/ML INJECTION    Inject 0.3 mLs (30 Units total) into the skin at bedtime.   METOPROLOL TARTRATE (LOPRESSOR) 25 MG TABLET    Take 0.5 tablets (12.5 mg total) by mouth 2 (two) times daily.   MYCOPHENOLATE (MYFORTIC) 180 MG EC TABLET    Take 540 mg by mouth 2 (two) times daily.   NOVOLOG FLEXPEN 100 UNIT/ML FLEXPEN    Inject 4-8 Units into the skin See admin instructions. 6 units at breakfast, 8 units at lunch and 6 units in the afternoon   OMEPRAZOLE (PRILOSEC) 20 MG CAPSULE    Take 20 mg by mouth daily.    TACROLIMUS (PROGRAF) 1 MG CAPSULE    Take 6 mg by mouth in the morning and at bedtime.    ULTICARE MICRO PEN NEEDLES 32G X 4 MM MISC      Modified Medications   No medications on file  Discontinued Medications   No medications on file    Allergies Allergies  Allergen Reactions   Shellfish Allergy Anaphylaxis   Shellfish-Derived Products Anaphylaxis   Sulfa Antibiotics Anaphylaxis    Other reaction(s): Other (See Comments)   Iodinated Contrast Media Other (See Comments)    Other reaction(s): Other (see comments)   Iodine Other (See Comments)    Other reaction(s): Other (see comments)    Past Medical History Past Medical History:  Diagnosis Date   Abnormal results of thyroid function studies 08/16/2011   Chronic kidney disease    Coronary artery disease    Diabetes mellitus without complication (HCC)    Diabetic foot ulcers (HCC) 10/2019   GERD (gastroesophageal reflux disease)    Glaucoma    Hypertension    Overweight    Partial blindness     Past Surgical History Past Surgical History:  Procedure Laterality Date   BONE BIOPSY Bilateral 11/03/2019   Procedure: BONE BIOPSY;  Surgeon: Asencion Islam, DPM;  Location: [facility] OR;  Service: Podiatry;  Laterality: Bilateral;   CARDIAC CATHETERIZATION     EYE SURGERY     GRAFT APPLICATION Bilateral 11/03/2019   Procedure: GRAFT APPLICATION;  Surgeon: Asencion Islam, DPM;  Location: [facility] OR;  Service: Podiatry;  Laterality:  Bilateral;  Integra bilayer 8x10   INCISION AND DRAINAGE OF WOUND Bilateral 11/03/2019   Procedure: IRRIGATION AND DEBRIDEMENT WOUND;  Surgeon: Asencion Islam, DPM;  Location: [facility] OR;  Service: Podiatry;  Laterality: Bilateral;   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  Social History Social History   Socioeconomic History   Marital status: Single    Spouse name: Not on file   Number of children: Not on file   Years of education: Not on file   Highest education level: Not on file  Occupational History   Not on file  Tobacco Use   Smoking status: Never   Smokeless tobacco: Never  Vaping Use   Vaping status: Never Used  Substance and Sexual Activity   Alcohol use: Not Currently   Drug use: Never   Sexual activity: Not on file  Other Topics Concern   Not on file  Social History Narrative   Not on file   Social Determinants of Health   Financial Resource Strain: Not on file  Food Insecurity: Not on file  Transportation Needs: Not on file  Physical Activity: Not on file  Stress: Not on file  Social Connections: Unknown (09/01/2021)   [facility], [facility]   Social Network    Social Network: Not on file  Intimate Partner Violence: Unknown (07/24/2021)   Received from [facility], [facility]   HITS    Physically Hurt: Not on file    Insult or Talk Down To: Not on file    Threaten Physical Harm: Not on file    Scream or Curse: Not on file    Lab Results  Component Value Date   HGBA1C 10.6 (H) 03/11/2023   HGBA1C 12.1 (H) 10/28/2019   HGBA1C 7.2 (A) 02/22/2019   Lab Results  Component Value Date   CHOL 133 03/11/2023   Lab Results  Component Value Date   HDL 39.30 03/11/2023   Lab Results  Component Value Date   LDLCALC 72 03/11/2023   Lab Results  Component Value Date   TRIG 105.0 03/11/2023   Lab Results  Component Value Date   CHOLHDL 3  03/11/2023   Lab Results  Component Value Date   CREATININE 1.57 (H) 03/11/2023   Lab Results  Component Value Date   GFR 52.35 (L) 03/11/2023   No results found for: "MICROALBUR", "MALB24HUR"    Component Value Date/Time   NA 136 03/11/2023 1216   K 4.4 03/11/2023 1216   CL 104 03/11/2023 1216   CO2 24 03/11/2023 1216   GLUCOSE 192 (H) 03/11/2023 1216   BUN 30 (H) 03/11/2023 1216   CREATININE 1.57 (H) 03/11/2023 1216   CALCIUM 9.3 03/11/2023 1216   PROT 6.3 03/11/2023 1216   ALBUMIN 3.9 03/11/2023 1216   AST 16 03/11/2023 1216   ALT 13 03/11/2023 1216   ALKPHOS 86 03/11/2023 1216   BILITOT 0.7 03/11/2023 1216   GFRNONAA >60 11/13/2019 1347   GFRAA >60 11/13/2019 1347      Latest Ref Rng & Units 03/11/2023   12:16 PM 11/13/2019    1:47 PM 11/04/2019    5:53 AM  BMP  Glucose 70 - 99 mg/dL 161  096  045   BUN 6 - 23 mg/dL 30  17  17    Creatinine 0.40 - 1.50 mg/dL 4.09  8.11  9.14   Sodium 135 - 145 mEq/L 136  136  132   Potassium 3.5 - 5.1 mEq/L 4.4  4.7  4.1   Chloride 96 - 112 mEq/L 104  99  101   CO2 19 -  32 mEq/L 24  24  24    Calcium 8.4 - 10.5 mg/dL 9.3  9.6  9.2        Component Value Date/Time   WBC 14.2 (H) 11/13/2019 1347   RBC 3.84 (L) 11/13/2019 1347   HGB 9.5 (L) 11/13/2019 1347   HCT 33.8 (L) 11/13/2019 1347   PLT 290 11/13/2019 1347   MCV 88.0 11/13/2019 1347   MCH 24.7 (L) 11/13/2019 1347   MCHC 28.1 (L) 11/13/2019 1347   RDW 16.1 (H) 11/13/2019 1347   LYMPHSABS 0.7 11/13/2019 1347   MONOABS 0.7 11/13/2019 1347   EOSABS 0.0 11/13/2019 1347   BASOSABS 0.0 11/13/2019 1347     Parts of this note may have been dictated using voice recognition software. There may be variances in spelling and vocabulary which are unintentional. Not all errors are proofread. Please notify the Thereasa Parkin if any discrepancies are noted or if the meaning of any statement is not clear.

## 2023-03-16 NOTE — Patient Instructions (Signed)

## 2023-03-21 ENCOUNTER — Other Ambulatory Visit: Payer: Medicare PPO

## 2023-03-23 ENCOUNTER — Encounter: Payer: Self-pay | Admitting: "Endocrinology

## 2023-03-23 ENCOUNTER — Ambulatory Visit (INDEPENDENT_AMBULATORY_CARE_PROVIDER_SITE_OTHER): Payer: Medicare PPO | Admitting: "Endocrinology

## 2023-03-23 VITALS — BP 160/70 | HR 93 | Ht 69.0 in | Wt 254.6 lb

## 2023-03-23 DIAGNOSIS — E1165 Type 2 diabetes mellitus with hyperglycemia: Secondary | ICD-10-CM | POA: Diagnosis not present

## 2023-03-23 DIAGNOSIS — Z794 Long term (current) use of insulin: Secondary | ICD-10-CM

## 2023-03-23 DIAGNOSIS — E7800 Pure hypercholesterolemia, unspecified: Secondary | ICD-10-CM | POA: Diagnosis not present

## 2023-03-23 MED ORDER — SEMAGLUTIDE(0.25 OR 0.5MG/DOS) 2 MG/3ML ~~LOC~~ SOPN: 0.5000 mg | PEN_INJECTOR | SUBCUTANEOUS | 0 refills | Status: DC

## 2023-03-23 NOTE — Patient Instructions (Signed)
Lantus 30 units at bedtime, increase by 2 units everyday until fasting BG is <140 Novolog 6 units before break fast, 10 units before lunch, 8 units before dinner 15 min before meals, + sliding scale Ozempic 0.25 mg every day -> 0.5 mg after 4 weeks   _________________

## 2023-03-23 NOTE — Progress Notes (Signed)
Outpatient Endocrinology Note Anthony Cove Neck, MD  03/23/23   [name] 1975/11/23 [id number]  Referring Provider: Lorenda Ishihara,* Primary Care Provider: Lorenda Ishihara, MD Reason for consultation: Subjective   Assessment & Plan  Diagnoses and all orders for this visit:  Uncontrolled type 2 diabetes mellitus with hyperglycemia (HCC)  Long-term insulin use (HCC)  Pure hypercholesterolemia  Other orders -     Semaglutide,0.25 [facility] 0.5MG /DOS, 2 MG/3ML SOPN; Inject 0.5 mg into the skin once a week.    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  No known contraindications/side effects to any of above medications Glucagon discussed and prescribed with refills on 03/23/23 No history of MEN syndrome/medullary thyroid cancer/pancreatitis [facility] pancreatic cancer in self [facility] family  -Last LD and Tg are as follows: Lab Results  Component Value Date   LDLCALC 72 03/11/2023    Lab Results  Component Value Date   TRIG 105.0 03/11/2023   -On atorvastatin 40 mg QD -Follow low fat diet and exercise   -Blood pressure goal <140/90 - Microalbumin/creatinine goal is < 30 -Last MA/Cr is as follows: No results found for: "MICROALBUR", "MALB24HUR" -not on ACE/ARB  -diet changes including salt restriction -limit eating outside -counseled BP targets per standards of diabetes care -uncontrolled blood pressure can lead to retinopathy,  nephropathy and cardiovascular and atherosclerotic heart disease  Reviewed and counseled on: -A1C target -Blood sugar targets -Complications of uncontrolled diabetes  -Checking blood sugar before meals and bedtime and bring log next visit -All medications with mechanism of action and side effects -Hypoglycemia management: rule of 15's, Glucagon Emergency Kit and medical alert ID -low-carb low-fat plate-method diet -At least 20 minutes of physical activity per day -Annual dilated retinal eye exam and foot exam -compliance and follow up needs -follow up as scheduled [facility] earlier if problem gets worse  Call if blood sugar is less than 70 [facility] consistently above 250    Take a 15 gm snack of carbohydrate at bedtime before you go to sleep if your blood sugar is less than 100.    If you are going to fast after midnight for a test [facility] procedure, ask your physician for instructions on how to reduce/decrease your insulin dose.    Call if blood sugar is less than 70 [facility] consistently above 250  -Treating a low sugar by rule of 15  (15 gms of sugar every 15 min until sugar is more than 70) If you feel your sugar is low, test your sugar to be sure If your sugar is low (less than 70), then take 15 grams of a fast acting Carbohydrate (3-4 glucose tablets [facility] glucose gel [facility] 4 ounces of juice [facility] regular soda) Recheck your sugar 15 min after treating low to make sure it is more than 70 If sugar is still less than 70, treat again with 15 grams of carbohydrate  Don't drive the hour of hypoglycemia  If unconscious/unable to eat [facility] drink by mouth, use glucagon injection [facility] nasal spray baqsimi and call 911. Can repeat again in 15 min if still unconscious.  Return in about 4 weeks (around 04/20/2023).   I have reviewed current medications, nurse's notes, allergies, vital signs, past medical and surgical history, family medical history, and social history for this encounter. Counseled patient on symptoms,  examination findings, lab findings, imaging results, treatment decisions and monitoring and prognosis. The patient understood the recommendations and agrees with the treatment plan. All questions regarding treatment plan were fully answered.  Anthony Hepzibah, MD  03/23/23   History of Present Illness Anthony [facility] is a 47 y.o. year old male who presents for evaluation of Type II diabetes mellitus.  Josel L Kronberg was first diagnosed at age 57.   Diabetes education +  Home diabetes regimen: Lantus 30 units at bedtime Novolog 6 units before break fast, 8 units before lunch, 6 units before dinner  COMPLICATIONS +  MI/Stroke +  retinopathy -  neuropathy +  nephropathy  SYMPTOMS REVIEWED + Polyuria - Weight loss - Blurred vision  BLOOD SUGAR DATA 35-298 tidac per recall Did not bring meter   Physical Exam  BP (!) 160/70   Pulse 93   Ht 5\' 9"  (1.753 m)   Wt 254 lb 9.6 oz (115.5 kg)   SpO2 97%   BMI 37.60 kg/m    Constitutional: well developed, well nourished Head: normocephalic, atraumatic Eyes: sclera anicteric, no redness Neck: supple Lungs: normal respiratory effort Neurology: alert and oriented Skin: dry, no appreciable rashes Musculoskeletal: no appreciable defects Psychiatric: normal mood and affect Diabetic Foot Exam - Simple   No data filed      Current Medications Patient's Medications  New Prescriptions   SEMAGLUTIDE,0.25 [facility] 0.5MG /DOS, 2 MG/3ML SOPN    Inject 0.5 mg into the skin once a week.  Previous Medications   ACETAMINOPHEN (TYLENOL) 500 MG TABLET    Take 500 mg by mouth daily.   ASPIRIN EC 81 MG TABLET    Take 81 mg by mouth.   ATORVASTATIN (LIPITOR) 40 MG TABLET    Take 1 tablet (40 mg total) by mouth daily.   BLOOD GLUCOSE MONITORING SUPPL (FIFTY50 GLUCOSE METER 2.0) W/DEVICE KIT       BLOOD GLUCOSE MONITORING SUPPL DEVI    1 each by Does not apply route in the morning, at noon, and at bedtime. May substitute to any manufacturer covered by  patient's insurance.   CONTINUOUS GLUCOSE SENSOR (DEXCOM G7 SENSOR) MISC    1 Device by Does not apply route continuous.   DORZOLAMIDE-TIMOLOL (COSOPT) 22.3-6.8 MG/ML OPHTHALMIC SOLUTION    Place 1 drop into the left eye 2 (two) times daily.    GLUCAGON (BAQSIMI ONE PACK) 3 MG/DOSE POWD    Place 1 Device into the nose as needed (Low blood sugar with impaired consciousness).   GLUCOSE BLOOD (BLOOD GLUCOSE TEST STRIPS) STRP    1 each by In Vitro route in the morning, at noon, and at bedtime. May substitute to any manufacturer covered by patient's insurance.   INSULIN GLARGINE (LANTUS) 100 UNIT/ML INJECTION    Inject 0.3 mLs (30 Units total) into the skin at bedtime.   LANCET DEVICE MISC    1 each by Does not apply route in the morning, at noon, and at bedtime. May substitute to any manufacturer covered by patient's insurance.   LANCETS MISC. MISC    1  each by Does not apply route in the morning, at noon, and at bedtime. May substitute to any manufacturer covered by patient's insurance.   METOPROLOL TARTRATE (LOPRESSOR) 25 MG TABLET    Take 0.5 tablets (12.5 mg total) by mouth 2 (two) times daily.   MYCOPHENOLATE (MYFORTIC) 180 MG EC TABLET    Take 540 mg by mouth 2 (two) times daily.   NOVOLOG FLEXPEN 100 UNIT/ML FLEXPEN    Inject 4-8 Units into the skin See admin instructions. 6 units at breakfast, 8 units at lunch and 6 units in the afternoon   OMEPRAZOLE (PRILOSEC) 20 MG CAPSULE    Take 20 mg by mouth daily.    TACROLIMUS (PROGRAF) 1 MG CAPSULE    Take 6 mg by mouth in the morning and at bedtime.    ULTICARE MICRO PEN NEEDLES 32G X 4 MM MISC      Modified Medications   No medications on file  Discontinued Medications   No medications on file    Allergies Allergies  Allergen Reactions   Shellfish Allergy Anaphylaxis   Shellfish-Derived Products Anaphylaxis   Sulfa Antibiotics Anaphylaxis    Other reaction(s): Other (See Comments)   Iodinated Contrast Media Other (See Comments)    Other  reaction(s): Other (see comments)   Iodine Other (See Comments)    Other reaction(s): Other (see comments)    Past Medical History Past Medical History:  Diagnosis Date   Abnormal results of thyroid function studies 08/16/2011   Chronic kidney disease    Coronary artery disease    Diabetes mellitus without complication (HCC)    Diabetic foot ulcers (HCC) 10/2019   GERD (gastroesophageal reflux disease)    Glaucoma    Hypertension    Overweight    Partial blindness     Past Surgical History Past Surgical History:  Procedure Laterality Date   BONE BIOPSY Bilateral 11/03/2019   Procedure: BONE BIOPSY;  Surgeon: Asencion Islam, DPM;  Location: [facility];  Service: Podiatry;  Laterality: Bilateral;   CARDIAC CATHETERIZATION     EYE SURGERY     GRAFT APPLICATION Bilateral 11/03/2019   Procedure: GRAFT APPLICATION;  Surgeon: Asencion Islam, DPM;  Location: [facility];  Service: Podiatry;  Laterality: Bilateral;  Integra bilayer 8x10   INCISION AND DRAINAGE OF WOUND Bilateral 11/03/2019   Procedure: IRRIGATION AND DEBRIDEMENT WOUND;  Surgeon: Asencion Islam, DPM;  Location: [facility];  Service: Podiatry;  Laterality: Bilateral;   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  Social History Social History   Socioeconomic History   Marital status: Single    Spouse name: Not on file   Number of children: Not on file   Years of education: Not on file   Highest education level: Not on file  Occupational History   Not on file  Tobacco Use   Smoking status: Never   Smokeless tobacco: Never  Vaping Use   Vaping status: Never Used  Substance and Sexual Activity   Alcohol use: Not Currently   Drug use: Never   Sexual activity: Not on file  Other Topics Concern   Not on file  Social History Narrative   Not on file   Social Determinants of Health   Financial Resource Strain: Not on file  Food Insecurity: Not  on file  Transportation Needs: Not on file  Physical Activity: Not on file  Stress: Not on file  Social Connections: Unknown (09/01/2021)  [facility] from [facility]   Social Network    Social Network: Not on file  Intimate Partner Violence: Unknown (07/24/2021)   Received from [facility]   HITS    Physically Hurt: Not on file    Insult [facility] Talk Down To: Not on file    Threaten Physical Harm: Not on file    Scream [facility] Curse: Not on file    Lab Results  Component Value Date   HGBA1C 10.6 (H) 03/11/2023   HGBA1C 12.1 (H) 10/28/2019   HGBA1C 7.2 (A) 02/22/2019   Lab Results  Component Value Date   CHOL 133 03/11/2023   Lab Results  Component Value Date   HDL 39.30 03/11/2023   Lab Results  Component Value Date   LDLCALC 72 03/11/2023   Lab Results  Component Value Date   TRIG 105.0 03/11/2023   Lab Results  Component Value Date   CHOLHDL 3 03/11/2023   Lab Results  Component Value Date   CREATININE 1.57 (H) 03/11/2023   Lab Results  Component Value Date   GFR 52.35 (L) 03/11/2023   No results found for: "MICROALBUR", "MALB24HUR"    Component Value Date/Time   NA 136 03/11/2023 1216   K 4.4 03/11/2023 1216   CL 104 03/11/2023 1216   CO2 24 03/11/2023 1216   GLUCOSE 192 (H) 03/11/2023 1216   BUN 30 (H) 03/11/2023 1216   CREATININE 1.57 (H) 03/11/2023 1216   CALCIUM 9.3 03/11/2023 1216   PROT 6.3 03/11/2023 1216   ALBUMIN 3.9 03/11/2023 1216   AST 16 03/11/2023 1216   ALT 13 03/11/2023 1216   ALKPHOS 86 03/11/2023 1216   BILITOT 0.7 03/11/2023 1216   GFRNONAA >60 11/13/2019 1347   GFRAA >60 11/13/2019 1347      Latest Ref Rng & Units 03/11/2023   12:16 PM 11/13/2019    1:47 PM 11/04/2019    5:53 AM  BMP  Glucose 70 - 99 mg/dL 485  462  703   BUN 6 - 23 mg/dL 30  17  17    Creatinine 0.40 - 1.50 mg/dL 5.00  9.38  1.82   Sodium 135 - 145 mEq/L 136  136  132   Potassium 3.5 - 5.1 mEq/L 4.4  4.7  4.1   Chloride 96 -  112 mEq/L 104  99  101   CO2 19 - 32 mEq/L 24  24  24    Calcium 8.4 - 10.5 mg/dL 9.3  9.6  9.2        Component Value Date/Time   WBC 14.2 (H) 11/13/2019 1347   RBC 3.84 (L) 11/13/2019 1347   HGB 9.5 (L) 11/13/2019 1347   HCT 33.8 (L) 11/13/2019 1347   PLT 290 11/13/2019 1347   MCV 88.0 11/13/2019 1347   MCH 24.7 (L) 11/13/2019 1347   MCHC 28.1 (L) 11/13/2019 1347   RDW 16.1 (H) 11/13/2019 1347   LYMPHSABS 0.7 11/13/2019 1347   MONOABS 0.7 11/13/2019 1347   EOSABS 0.0 11/13/2019 1347   BASOSABS 0.0 11/13/2019 1347     Parts of this note may have been dictated using voice recognition software. There may be variances in spelling and vocabulary which are unintentional. Not all errors are proofread. Please notify the There[facility] Parkin if any discrepancies are noted [facility] if the meaning of any statement is not clear.

## 2023-03-25 ENCOUNTER — Ambulatory Visit
Admission: RE | Admit: 2023-03-25 | Discharge: 2023-03-25 | Disposition: A | Discharge status: Home / Self Care | Payer: Medicare PPO | Source: Ambulatory Visit | Attending: Nephrology | Admitting: Nephrology

## 2023-03-25 DIAGNOSIS — Z940 Kidney transplant status: Secondary | ICD-10-CM | POA: Diagnosis not present

## 2023-04-01 DIAGNOSIS — H401122 Primary open-angle glaucoma, left eye, moderate stage: Secondary | ICD-10-CM | POA: Diagnosis not present

## 2023-04-06 DIAGNOSIS — R0683 Snoring: Secondary | ICD-10-CM | POA: Diagnosis not present

## 2023-04-06 DIAGNOSIS — G4733 Obstructive sleep apnea (adult) (pediatric): Secondary | ICD-10-CM | POA: Diagnosis not present

## 2023-04-25 ENCOUNTER — Encounter: Payer: Self-pay | Admitting: "Endocrinology

## 2023-04-25 ENCOUNTER — Telehealth (INDEPENDENT_AMBULATORY_CARE_PROVIDER_SITE_OTHER): Payer: Medicare PPO | Admitting: "Endocrinology

## 2023-04-25 VITALS — Ht 69.0 in

## 2023-04-25 DIAGNOSIS — E7800 Pure hypercholesterolemia, unspecified: Secondary | ICD-10-CM

## 2023-04-25 DIAGNOSIS — E1165 Type 2 diabetes mellitus with hyperglycemia: Secondary | ICD-10-CM

## 2023-04-25 DIAGNOSIS — Z794 Long term (current) use of insulin: Secondary | ICD-10-CM

## 2023-04-25 DIAGNOSIS — Z7985 Long-term (current) use of injectable non-insulin antidiabetic drugs: Secondary | ICD-10-CM

## 2023-04-25 MED ORDER — SEMAGLUTIDE (1 MG/DOSE) 4 MG/3ML ~~LOC~~ SOPN: 1.0000 mg | PEN_INJECTOR | SUBCUTANEOUS | 0 refills | Status: AC

## 2023-04-25 NOTE — Patient Instructions (Addendum)
 Will recommend the following: Lantus  32 units at bedtime Novolog  8 units before break fast, 12 units before lunch, 10 units before dinner 15 min before meals, + sliding scale Ozempic  0.5 mg after 4 weeks->1 mg weekly   ______________    Goals of DM therapy:  Morning Fasting blood sugar: 80-140  Blood sugar before meals: 80-140 Bed time blood sugar: 100-150  A1C <7%, limited only by hypoglycemia  1.Diabetes medications and their side effects discussed, including hypoglycemia    2. Check blood glucose:  a) Always check blood sugars before driving. Please see below (under hypoglycemia) on how to manage b) Check a minimum of 3 times/day or more as needed when having symptoms of hypoglycemia.   c) Try to check blood glucose before sleeping/in the middle of the night to ensure that it is remaining stable and not dropping less than 100 d) Check blood glucose more often if sick  3. Diet: a) 3 meals per day schedule b: Restrict carbs to 60-70 grams (4 servings) per meal c) Colorful vegetables - 3 servings a day, and low sugar fruit 2 servings/day Plate control method: 1/4 plate protein, 1/4 starch, 1/2 green, yellow, or red vegetables d) Avoid carbohydrate snacks unless hypoglycemic episode, or increased physical activity  4. Regular exercise as tolerated, preferably 3 or more hours a week  5. Hypoglycemia: a)  Do not drive or operate machinery without first testing blood glucose to assure it is over 90 mg%, or if dizzy, lightheaded, not feeling normal, etc, or  if foot or leg is numb or weak. b)  If blood glucose less than 70, take four 5gm Glucose tabs or 15-30 gm Glucose gel.  Repeat every 15 min as needed until blood sugar is >100 mg/dl. If hypoglycemia persists then call 911.   6. Sick day management: a) Check blood glucose more often b) Continue usual therapy if blood sugars are elevated.   7. Contact the doctor immediately if blood glucose is frequently <60 mg/dl, or an  episode of severe hypoglycemia occurs (where someone had to give you glucose/  glucagon  or if you passed out from a low blood glucose), or if blood glucose is persistently >350 mg/dl, for further management  8. A change in level of physical activity or exercise and a change in diet may also affect your blood sugar. Check blood sugars more often and call if needed.  Instructions: 1. Bring glucose meter, blood glucose records on every visit for review 2. Continue to follow up with primary care physician and other providers for medical care 3. Yearly eye  and foot exam 4. Please get blood work done prior to the next appointment

## 2023-04-25 NOTE — Progress Notes (Signed)
 The patient reports they are currently: Anthony Blanchard. It was a video viist with the patient on the date of service. I spent an additional 10 minutes on pre- and post-visit activities on the date of service.   The patient was physically located in [place]  or a state in which I am permitted to provide care. The patient and/or parent/guardian understood that s/he may incur co-pays and cost sharing, and agreed to the telemedicine visit. The visit was reasonable and appropriate under the circumstances given the patient's presentation at the time.  The patient and/or parent/guardian has been advised of the potential risks and limitations of this mode of treatment (including, but not limited to, the absence of in-person examination) and has agreed to be treated using telemedicine. The patient's/patient's family's questions regarding telemedicine have been answered.   The patient and/or parent/guardian has also been advised to contact their provider's office for worsening conditions, and seek emergency medical treatment and/or call 911 if the patient deems either necessary.    Outpatient Endocrinology Note Obadiah Birmingham, MD  04/25/23   Kathreen LITTIE Pizza [birth year] [id number]  Referring Provider: Elliot Charm,* Primary Care Provider: Elliot Charm, MD Reason for consultation: Subjective   Assessment & Plan  Nik was seen today for follow-up.  Diagnoses and all orders for this visit:  Uncontrolled type 2 diabetes mellitus with hyperglycemia (HCC)  Long-term insulin  use (HCC)  Long-term (current) use of injectable non-insulin  antidiabetic drugs  Pure hypercholesterolemia  Other orders -     Semaglutide , 1 MG/DOSE, 4 MG/3ML SOPN; Inject 1 mg as directed once a week.     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  No known contraindications/side effects to any of above medications Glucagon  discussed and prescribed with refills on 03/16/23 No history of MEN syndrome/medullary thyroid  cancer/pancreatitis or pancreatic cancer in self or family  -Last LD and Tg are as follows: Lab Results  Component Value Date   LDLCALC 72 03/11/2023    Lab Results  Component Value Date   TRIG 105.0 03/11/2023   -On atorvastatin  40 mg QD -Follow low fat diet and exercise   -Blood pressure goal <140/90 - Microalbumin/creatinine goal is < 30 -Last MA/Cr is as follows: No results found for: MICROALBUR, MALB24HUR -not on ACE/ARB  -diet changes including salt restriction -limit eating outside -counseled BP targets per standards of diabetes care -uncontrolled blood pressure can lead to retinopathy, nephropathy and cardiovascular and atherosclerotic heart disease  Reviewed and counseled on: -A1C target -Blood sugar targets -Complications of uncontrolled diabetes  -Checking blood sugar before meals and bedtime and bring log next visit -All medications with mechanism of action and side effects -Hypoglycemia management: rule of 15's, Glucagon  Emergency Kit and medical alert ID -low-carb low-fat plate-method diet -At least 20 minutes of physical activity per day -Annual dilated retinal eye exam and foot exam -compliance and follow up needs -follow up as scheduled or earlier if problem gets worse  Call if blood sugar is less  than 70 or consistently above 250    Take a 15 gm snack of carbohydrate at bedtime before you go to sleep if your blood sugar is less than 100.    If you are going to fast after  midnight for a test or procedure, ask your physician for instructions on how to reduce/decrease your insulin  dose.    Call if blood sugar is less than 70 or consistently above 250  -Treating a low sugar by rule of 15  (15 gms of sugar every 15 min until sugar is more than 70) If you feel your sugar is low, test your sugar to be sure If your sugar is low (less than 70), then take 15 grams of a fast acting Carbohydrate (3-4 glucose tablets or glucose gel or 4 ounces of juice or regular soda) Recheck your sugar 15 min after treating low to make sure it is more than 70 If sugar is still less than 70, treat again with 15 grams of carbohydrate          Don't drive the hour of hypoglycemia  If unconscious/unable to eat or drink by mouth, use glucagon  injection or nasal spray baqsimi  and call 911. Can repeat again in 15 min if still unconscious.  Return in about 22 days (around 05/17/2023).   I have reviewed current medications, nurse's notes, allergies, vital signs, past medical and surgical history, family medical history, and social history for this encounter. Counseled patient on symptoms, examination findings, lab findings, imaging results, treatment decisions and monitoring and prognosis. The patient understood the recommendations and agrees with the treatment plan. All questions regarding treatment plan were fully answered.  Obadiah Birmingham, MD  04/25/23   History of Present Illness MISHAEL HARAN is a 48 y.o. year old male who presents for follow up of Type II diabetes mellitus.  Ragnar L Borgmeyer was first diagnosed at age 62.   Diabetes education +  Home diabetes regimen: Lantus  30 units at bedtime Novolog  6 units before break fast, 10 units before lunch, 8 units before dinner 15 min before meal Ozempic  0.5 mg/week  COMPLICATIONS +  MI/Stroke +  retinopathy -  neuropathy +  nephropathy  SYMPTOMS REVIEWED + Polyuria - Weight loss - Blurred vision  BLOOD SUGAR DATA 160-243 Has  Dexcom with receiver  Physical Exam  Ht 5' 9 (1.753 m)   BMI 37.60 kg/m    Constitutional: well developed, well nourished Head: normocephalic, atraumatic Eyes: sclera anicteric, no redness Neck: supple Lungs: normal respiratory effort Neurology: alert and oriented Skin: dry, no appreciable rashes Musculoskeletal: no appreciable defects Psychiatric: normal mood and affect Diabetic Foot Exam - Simple   No data filed      Current Medications Patient's Medications  New Prescriptions   SEMAGLUTIDE , 1 MG/DOSE, 4 MG/3ML SOPN    Inject 1 mg as directed once a week.  Previous Medications   ACETAMINOPHEN  (TYLENOL ) 500 MG TABLET    Take 500 mg by mouth daily.   ASPIRIN  EC 81 MG TABLET    Take 81 mg by mouth.   ATORVASTATIN  (LIPITOR) 40 MG TABLET    Take 1 tablet (40 mg total) by mouth daily.   BLOOD GLUCOSE MONITORING SUPPL (FIFTY50 GLUCOSE METER 2.0) W/DEVICE KIT       BLOOD GLUCOSE MONITORING SUPPL DEVI    1 each by Does not apply route in the morning, at noon, and at bedtime. May substitute to any manufacturer covered by patient's insurance.   CONTINUOUS GLUCOSE SENSOR (DEXCOM  G7 SENSOR) MISC    1 Device by Does not apply route continuous.   DORZOLAMIDE -TIMOLOL  (COSOPT ) 22.3-6.8 MG/ML OPHTHALMIC SOLUTION    Place 1 drop into the left eye 2 (two) times daily.    GLUCAGON  (BAQSIMI  ONE PACK) 3 MG/DOSE POWD    Place 1 Device into the nose as needed (Low blood sugar with impaired consciousness).   GLUCOSE BLOOD (BLOOD GLUCOSE TEST STRIPS) STRP    1 each by In Vitro route in the morning, at noon, and at bedtime. May substitute to any manufacturer covered by patient's insurance.   INSULIN  GLARGINE (LANTUS ) 100 UNIT/ML INJECTION    Inject 0.3 mLs (30 Units total) into the skin at bedtime.   LOSARTAN-HYDROCHLOROTHIAZIDE (HYZAAR) 50-12.5 MG TABLET    Take 1 tablet by mouth daily.   METOPROLOL  TARTRATE (LOPRESSOR ) 25 MG TABLET    Take 0.5 tablets (12.5 mg total) by mouth 2 (two) times daily.    METOPROLOL  TARTRATE (LOPRESSOR ) 50 MG TABLET    Take 50 mg by mouth daily.   MOXIFLOXACIN (VIGAMOX) 0.5 % OPHTHALMIC SOLUTION    Apply 1 drop to eye 4 (four) times daily.   MYCOPHENOLATE  (MYFORTIC ) 180 MG EC TABLET    Take 540 mg by mouth 2 (two) times daily.   NOVOLOG  FLEXPEN 100 UNIT/ML FLEXPEN    Inject 4-8 Units into the skin See admin instructions. 6 units at breakfast, 8 units at lunch and 6 units in the afternoon   OMEPRAZOLE (PRILOSEC) 20 MG CAPSULE    Take 20 mg by mouth daily.    SIMBRINZA 1-0.2 % SUSP    Place 1 drop into the left eye 2 (two) times daily.   TACROLIMUS  (PROGRAF ) 1 MG CAPSULE    Take 6 mg by mouth in the morning and at bedtime.    ULTICARE MICRO PEN NEEDLES 32G X 4 MM MISC      Modified Medications   No medications on file  Discontinued Medications   SEMAGLUTIDE ,0.25 OR 0.5MG /DOS, 2 MG/3ML SOPN    Inject 0.5 mg into the skin once a week.    Allergies Allergies  Allergen Reactions   Shellfish Allergy Anaphylaxis   Shellfish-Derived Products Anaphylaxis   Sulfa Antibiotics Anaphylaxis    Other reaction(s): Other (See Comments)   Iodinated Contrast Media Other (See Comments)    Other reaction(s): Other (see comments)   Iodine Other (See Comments)    Other reaction(s): Other (see comments)    Past Medical History Past Medical History:  Diagnosis Date   Abnormal results of thyroid  function studies 08/16/2011   Chronic kidney disease    Coronary artery disease    Diabetes mellitus without complication (HCC)    Diabetic foot ulcers (HCC) 10/2019   GERD (gastroesophageal reflux disease)    Glaucoma    Hypertension    Overweight    Partial blindness     Past Surgical History Past Surgical History:  Procedure Laterality Date   BONE BIOPSY Bilateral 11/03/2019   Procedure: BONE BIOPSY;  Surgeon: Burt Fus, DPM;  Location: [facility] OR;  Service: Podiatry;  Laterality: Bilateral;   CARDIAC CATHETERIZATION     EYE SURGERY     GRAFT APPLICATION Bilateral  11/03/2019   Procedure: GRAFT APPLICATION;  Surgeon: Burt Fus, DPM;  Location: [facility] OR;  Service: Podiatry;  Laterality: Bilateral;  Integra bilayer 8x10   INCISION AND DRAINAGE OF WOUND Bilateral 11/03/2019   Procedure: IRRIGATION AND DEBRIDEMENT WOUND;  Surgeon: Burt Fus, DPM;  Location: [facility] OR;  Service: Podiatry;  Laterality: Bilateral;   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  Social History Social History   Socioeconomic History   Marital status: Single    Spouse name: Not on file   Number of children: Not on file   Years of education: Not on file   Highest education level: Not on file  Occupational History   Not on file  Tobacco Use   Smoking status: Never   Smokeless tobacco: Never  Vaping Use   Vaping status: Never Used  Substance and Sexual Activity   Alcohol use: Not Currently   Drug use: Never   Sexual activity: Not on file  Other Topics Concern   Not on file  Social History Narrative   Not on file   Social Drivers of Health   Financial Resource Strain: Not on file  Food Insecurity: Not on file  Transportation Needs: Not on file  Physical Activity: Not on file  Stress: Not on file  Social Connections: Unknown (09/01/2021)   Received from [facility]   Social Network    Social Network: Not on file  Intimate Partner Violence: Unknown (07/24/2021)   Received from [facility]   HITS    Physically Hurt: Not on file    Insult or Talk Down To: Not on file    Threaten Physical Harm: Not on file    Scream or Curse: Not on file    Lab Results  Component Value Date   HGBA1C 10.6 (H) 03/11/2023   HGBA1C 12.1 (H) 10/28/2019   HGBA1C 7.2 (A) 02/22/2019   Lab Results  Component Value Date   CHOL 133 03/11/2023   Lab Results  Component Value Date   HDL 39.30 03/11/2023   Lab Results  Component Value Date   LDLCALC 72 03/11/2023    Lab Results  Component Value Date   TRIG 105.0 03/11/2023   Lab Results  Component Value Date   CHOLHDL 3 03/11/2023   Lab Results  Component Value Date   CREATININE 1.57 (H) 03/11/2023   Lab Results  Component Value Date   GFR 52.35 (L) 03/11/2023   No results found for: MACKEY CURRENT    Component Value Date/Time   NA 136 03/11/2023 1216   K 4.4 03/11/2023 1216   CL 104 03/11/2023 1216   CO2 24 03/11/2023 1216   GLUCOSE 192 (H) 03/11/2023 1216   BUN 30 (H) 03/11/2023 1216   CREATININE 1.57 (H) 03/11/2023 1216   CALCIUM  9.3 03/11/2023 1216   PROT 6.3 03/11/2023 1216   ALBUMIN 3.9 03/11/2023 1216   AST 16 03/11/2023 1216   ALT 13 03/11/2023 1216   ALKPHOS 86 03/11/2023 1216   BILITOT 0.7 03/11/2023 1216   GFRNONAA >60 11/13/2019 1347   GFRAA >60 11/13/2019 1347      Latest Ref Rng & Units 03/11/2023   12:16 PM 11/13/2019    1:47 PM 11/04/2019    5:53 AM  BMP  Glucose 70 - 99 mg/dL 807  774  851   BUN 6 - 23 mg/dL 30  17  17    Creatinine 0.40 - 1.50 mg/dL 8.42  8.64  8.63   Sodium 135 - 145 mEq/L 136  136  132   Potassium 3.5 - 5.1 mEq/L 4.4  4.7  4.1   Chloride 96 - 112 mEq/L 104  99  101   CO2 19 - 32 mEq/L 24  24  24   Calcium  8.4 - 10.5 mg/dL 9.3  9.6  9.2        Component Value Date/Time   WBC 14.2 (H) 11/13/2019 1347   RBC 3.84 (L) 11/13/2019 1347   HGB 9.5 (L) 11/13/2019 1347   HCT 33.8 (L) 11/13/2019 1347   PLT 290 11/13/2019 1347   MCV 88.0 11/13/2019 1347   MCH 24.7 (L) 11/13/2019 1347   MCHC 28.1 (L) 11/13/2019 1347   RDW 16.1 (H) 11/13/2019 1347   LYMPHSABS 0.7 11/13/2019 1347   MONOABS 0.7 11/13/2019 1347   EOSABS 0.0 11/13/2019 1347   BASOSABS 0.0 11/13/2019 1347     Parts of this note may have been dictated using voice recognition software. There may be variances in spelling and vocabulary which are unintentional. Not all errors are proofread. Please notify the dino if any discrepancies are noted or if the meaning of  any statement is not clear.

## 2023-05-09 ENCOUNTER — Ambulatory Visit: Payer: Medicare PPO | Admitting: Skilled Nursing Facility1

## 2023-05-10 DIAGNOSIS — G4734 Idiopathic sleep related nonobstructive alveolar hypoventilation: Secondary | ICD-10-CM | POA: Diagnosis not present

## 2023-05-10 DIAGNOSIS — G4733 Obstructive sleep apnea (adult) (pediatric): Secondary | ICD-10-CM | POA: Diagnosis not present

## 2023-05-18 ENCOUNTER — Ambulatory Visit (INDEPENDENT_AMBULATORY_CARE_PROVIDER_SITE_OTHER): Payer: Medicare PPO | Admitting: Podiatry

## 2023-05-18 ENCOUNTER — Encounter: Payer: Self-pay | Admitting: Podiatry

## 2023-05-18 DIAGNOSIS — B351 Tinea unguium: Secondary | ICD-10-CM

## 2023-05-18 DIAGNOSIS — Z794 Long term (current) use of insulin: Secondary | ICD-10-CM

## 2023-05-18 DIAGNOSIS — M79674 Pain in right toe(s): Secondary | ICD-10-CM | POA: Diagnosis not present

## 2023-05-18 DIAGNOSIS — E1169 Type 2 diabetes mellitus with other specified complication: Secondary | ICD-10-CM

## 2023-05-18 NOTE — Progress Notes (Signed)
This patient returns to my office for at risk foot care.  This patient requires this care by a professional since this patient will be at risk due to having diabetes and kidney disease. He has amputation left leg  BK.  This patient is unable to cut nails himself since the patient cannot reach his nails.These nails are painful walking and wearing shoes. He presents to the office with his wife. This patient presents for at risk foot care today.  General Appearance  Alert, conversant and in no acute stress.  Vascular  Dorsalis pedis and posterior tibial  pulses are palpable  bilaterally.  Capillary return is within normal limits  bilaterally. Temperature is within normal limits  bilaterally.  Neurologic  Senn-Weinstein monofilament wire test within normal limits  bilaterally. Muscle power within normal limits bilaterally.  Nails Thick disfigured discolored nails with subungual debris  from hallux to fifth toes bilaterally. No evidence of bacterial infection or drainage bilaterally.  Orthopedic  No limitations of motion  feet .  No crepitus or effusions noted.  No bony pathology or digital deformities noted.  Skin  normotropic skin with no porokeratosis noted bilaterally.  No signs of infections or ulcers noted.     Onychomycosis  Pain in right toes  Pain in left toes  Consent was obtained for treatment procedures.   Mechanical debridement of nails 1-5  bilaterally performed with a nail nipper.  Filed with dremel without incident.    Return office visit    3 months                  Told patient to return for periodic foot care and evaluation due to potential at risk complications.   Helane Gunther DPM

## 2023-05-20 NOTE — Unmapped (Signed)
[facility] Specialty and Home Delivery Pharmacy Refill Coordination Note    Specialty Medication(s) to be Shipped:   Transplant: mycophenolate mofetil 180 mg and tacrolimus 1mg     Other medication(s) to be shipped:  atorvastatin , Lantus , pen needles , metoprolol , omeprazole and novolog     Benjamin Maldonado, DOB: 1976/01/16  Phone: [phone] (home)       All above HIPAA information was verified with patient.     Was a Nurse, learning disability used for this call? No    Completed refill call assessment today to schedule patient's medication shipment from the [facility] and Home Delivery Pharmacy  [phone]).  All relevant notes have been reviewed.     Specialty medication(s) and dose(s) confirmed: Regimen is correct and unchanged.   Changes to medications: Alistair reports no changes at this time.  Changes to insurance: No  New side effects reported not previously addressed with a pharmacist or physician: None reported  Questions for the pharmacist: No    Confirmed patient received a Conservation officer, historic buildings and a Surveyor, mining with first shipment. The patient will receive a drug information handout for each medication shipped and additional FDA Medication Guides as required.       DISEASE/MEDICATION-SPECIFIC INFORMATION        N/A    SPECIALTY MEDICATION ADHERENCE     Medication Adherence    Patient reported X missed doses in the last month: 0  Specialty Medication: mycophenolate 180 MG EC tablet (MYFORTIC)  Patient is on additional specialty medications: Yes  Additional Specialty Medications: tacrolimus 1 MG capsule (PROGRAF)  Patient Reported Additional Medication X Missed Doses in the Last Month: 0  Patient is on more than two specialty medications: No  Adherence tools used: patient uses a pill box to manage medications  Support network for adherence: family member              Were doses missed due to medication being on hold? No    tacrolimus 1 mg: 5 days of medicine on hand   mycophenolate 180 mg: 5 days of medicine on hand       REFERRAL TO PHARMACIST     Referral to the pharmacist: Not needed      [facility]     Shipping address confirmed in Epic.       Delivery Scheduled: Yes, Expected medication delivery date: 05/24/2023.     Medication will be delivered via UPS to the prescription address in Epic WAM.    [name]   [facility] Specialty and Home Delivery Pharmacy  Specialty Technician

## 2023-05-23 MED FILL — OMEPRAZOLE 20 MG CAPSULE,DELAYED RELEASE: ORAL | 30 days supply | Qty: 30 | Fill #7

## 2023-05-23 MED FILL — MYCOPHENOLATE SODIUM 180 MG TABLET,DELAYED RELEASE: ORAL | 30 days supply | Qty: 180 | Fill #10

## 2023-05-23 MED FILL — METOPROLOL TARTRATE 50 MG TABLET: ORAL | 30 days supply | Qty: 90 | Fill #8

## 2023-05-23 MED FILL — LANTUS SOLOSTAR U-100 INSULIN 100 UNIT/ML (3 ML) SUBCUTANEOUS PEN: SUBCUTANEOUS | 93 days supply | Qty: 15 | Fill #0

## 2023-05-23 MED FILL — ATORVASTATIN 20 MG TABLET: ORAL | 90 days supply | Qty: 90 | Fill #2

## 2023-05-23 MED FILL — TACROLIMUS 1 MG CAPSULE, IMMEDIATE-RELEASE: ORAL | 30 days supply | Qty: 360 | Fill #1

## 2023-05-23 MED FILL — NOVOLOG FLEXPEN U-100 INSULIN ASPART 100 UNIT/ML (3 ML) SUBCUTANEOUS: SUBCUTANEOUS | 55 days supply | Qty: 30 | Fill #1

## 2023-05-23 MED FILL — BD ULTRA-FINE NANO PEN NEEDLE 32 GAUGE X 5/32" (4 MM): 25 days supply | Qty: 100 | Fill #2

## 2023-05-24 DIAGNOSIS — T8611 Kidney transplant rejection: Principal | ICD-10-CM

## 2023-05-24 DIAGNOSIS — G4733 Obstructive sleep apnea (adult) (pediatric): Secondary | ICD-10-CM | POA: Diagnosis not present

## 2023-06-23 DIAGNOSIS — Z940 Kidney transplant status: Principal | ICD-10-CM

## 2023-06-23 MED ORDER — MYCOPHENOLATE SODIUM 180 MG TABLET,DELAYED RELEASE
ORAL_TABLET | Freq: Two times a day (BID) | ORAL | 11 refills | 30.00 days | Status: CP
Start: 2023-06-23 — End: 2024-06-22
  Filled 2023-06-24: qty 180, 30d supply, fill #0

## 2023-06-23 NOTE — Unmapped (Signed)
 [facility] Specialty and Home Delivery Pharmacy Refill Coordination Note    Specialty Medication(s) to be Shipped:   Transplant:  mycophenolic acid 180mg  and tacrolimus 1mg     Other medication(s) to be shipped:  pen needles, metoprolol, omeprazole,      Benjamin Maldonado, DOB: Feb 21, 1976  Phone: [phone] (home)       All above HIPAA information was verified with patient.     Was a Nurse, learning disability used for this call? No    Completed refill call assessment today to schedule patient's medication shipment from the [facility] and Home Delivery Pharmacy  [phone]).  All relevant notes have been reviewed.     Specialty medication(s) and dose(s) confirmed: Regimen is correct and unchanged.   Changes to medications: Benjamin Maldonado reports no changes at this time.  Changes to insurance: No  New side effects reported not previously addressed with a pharmacist or physician: None reported  Questions for the pharmacist: No    Confirmed patient received a Conservation officer, historic buildings and a Surveyor, mining with first shipment. The patient will receive a drug information handout for each medication shipped and additional FDA Medication Guides as required.       DISEASE/MEDICATION-SPECIFIC INFORMATION        N/A    SPECIALTY MEDICATION ADHERENCE     Medication Adherence    Patient reported X missed doses in the last month: 0  Specialty Medication: Tacrolimus 1mg   Patient is on additional specialty medications: Yes  Additional Specialty Medications: Mycophenolate 180mg   Patient Reported Additional Medication X Missed Doses in the Last Month: 0  Patient is on more than two specialty medications: No  Adherence tools used: patient uses a pill box to manage medications  Support network for adherence: family member              Were doses missed due to medication being on hold? No    Tacrolimus 1 mg: 5 days of medicine on hand   Mycophenolate 180 mg: 5 days of medicine on hand     REFERRAL TO PHARMACIST     Referral to the pharmacist: Not needed      [facility]     Shipping address confirmed in Epic.       Delivery Scheduled: Yes, Expected medication delivery date: 06/27/23.     Medication will be delivered via UPS to the prescription address in Epic WAM.    Tera Helper, [facility]   [facility] Specialty and Home Delivery Pharmacy  Specialty Pharmacist

## 2023-06-24 MED FILL — TACROLIMUS 1 MG CAPSULE, IMMEDIATE-RELEASE: ORAL | 30 days supply | Qty: 360 | Fill #2

## 2023-06-24 MED FILL — OMEPRAZOLE 20 MG CAPSULE,DELAYED RELEASE: ORAL | 30 days supply | Qty: 30 | Fill #8

## 2023-06-24 MED FILL — METOPROLOL TARTRATE 50 MG TABLET: ORAL | 30 days supply | Qty: 90 | Fill #9

## 2023-06-24 MED FILL — BD ULTRA-FINE NANO PEN NEEDLE 32 GAUGE X 5/32" (4 MM): 25 days supply | Qty: 100 | Fill #3

## 2023-06-30 DIAGNOSIS — E785 Hyperlipidemia, unspecified: Secondary | ICD-10-CM | POA: Diagnosis not present

## 2023-06-30 DIAGNOSIS — I10 Essential (primary) hypertension: Secondary | ICD-10-CM | POA: Diagnosis not present

## 2023-06-30 DIAGNOSIS — D631 Anemia in chronic kidney disease: Secondary | ICD-10-CM | POA: Diagnosis not present

## 2023-06-30 DIAGNOSIS — E1129 Type 2 diabetes mellitus with other diabetic kidney complication: Secondary | ICD-10-CM | POA: Diagnosis not present

## 2023-06-30 DIAGNOSIS — D849 Immunodeficiency, unspecified: Secondary | ICD-10-CM | POA: Diagnosis not present

## 2023-06-30 DIAGNOSIS — Z940 Kidney transplant status: Secondary | ICD-10-CM | POA: Diagnosis not present

## 2023-07-04 DIAGNOSIS — H401123 Primary open-angle glaucoma, left eye, severe stage: Secondary | ICD-10-CM | POA: Diagnosis not present

## 2023-07-11 MED FILL — NOVOLOG FLEXPEN U-100 INSULIN ASPART 100 UNIT/ML (3 ML) SUBCUTANEOUS: SUBCUTANEOUS | 55 days supply | Qty: 30 | Fill #2

## 2023-07-12 DIAGNOSIS — Z89512 Acquired absence of left leg below knee: Secondary | ICD-10-CM | POA: Diagnosis not present

## 2023-07-12 DIAGNOSIS — G4733 Obstructive sleep apnea (adult) (pediatric): Secondary | ICD-10-CM | POA: Diagnosis not present

## 2023-07-12 DIAGNOSIS — E785 Hyperlipidemia, unspecified: Secondary | ICD-10-CM | POA: Diagnosis not present

## 2023-07-12 DIAGNOSIS — E1142 Type 2 diabetes mellitus with diabetic polyneuropathy: Secondary | ICD-10-CM | POA: Diagnosis not present

## 2023-07-12 DIAGNOSIS — E1165 Type 2 diabetes mellitus with hyperglycemia: Secondary | ICD-10-CM | POA: Diagnosis not present

## 2023-07-12 DIAGNOSIS — I509 Heart failure, unspecified: Secondary | ICD-10-CM | POA: Diagnosis not present

## 2023-07-12 DIAGNOSIS — Z794 Long term (current) use of insulin: Secondary | ICD-10-CM | POA: Diagnosis not present

## 2023-07-12 DIAGNOSIS — I130 Hypertensive heart and chronic kidney disease with heart failure and stage 1 through stage 4 chronic kidney disease, or unspecified chronic kidney disease: Secondary | ICD-10-CM | POA: Diagnosis not present

## 2023-07-12 DIAGNOSIS — E113599 Type 2 diabetes mellitus with proliferative diabetic retinopathy without macular edema, unspecified eye: Secondary | ICD-10-CM | POA: Diagnosis not present

## 2023-07-12 DIAGNOSIS — E1136 Type 2 diabetes mellitus with diabetic cataract: Secondary | ICD-10-CM | POA: Diagnosis not present

## 2023-07-12 DIAGNOSIS — E1151 Type 2 diabetes mellitus with diabetic peripheral angiopathy without gangrene: Secondary | ICD-10-CM | POA: Diagnosis not present

## 2023-07-22 ENCOUNTER — Other Ambulatory Visit: Payer: Self-pay | Admitting: "Endocrinology

## 2023-07-22 NOTE — Telephone Encounter (Signed)
 Requested Prescriptions   Pending Prescriptions Disp Refills   Continuous Glucose Sensor (DEXCOM G7 SENSOR) MISC [Pharmacy Med Name: DEXCOM G7 SENSOR] 9 each 0    Sig: USE AS DIRECTED, CHANGE SENSOR EVERY 10 DAYS

## 2023-07-25 NOTE — Unmapped (Signed)
 [facility] Specialty and Home Delivery Pharmacy Refill Coordination Note    Specialty Medication(s) to be Shipped:   Transplant: mycophenolate mofetil 180mg  and tacrolimus 1mg     Other medication(s) to be shipped:  metoprolol and omeprazole     Benjamin Maldonado, DOB: 19-Sep-1975  Phone: [phone] (home)       All above HIPAA information was verified with patient.     Was a Nurse, learning disability used for this call? No    Completed refill call assessment today to schedule patient's medication shipment from the [facility] and Home Delivery Pharmacy  [phone]).  All relevant notes have been reviewed.     Specialty medication(s) and dose(s) confirmed: Regimen is correct and unchanged.   Changes to medications: Benjamin Maldonado reports no changes at this time.  Changes to insurance: No  New side effects reported not previously addressed with a pharmacist or physician: None reported  Questions for the pharmacist: No    Confirmed patient received a Conservation officer, historic buildings and a Surveyor, mining with first shipment. The patient will receive a drug information handout for each medication shipped and additional FDA Medication Guides as required.       DISEASE/MEDICATION-SPECIFIC INFORMATION        N/A    SPECIALTY MEDICATION ADHERENCE     Medication Adherence    Patient reported X missed doses in the last month: 1  Specialty Medication: mycophenolate 180 MG EC tablet (MYFORTIC)  Patient is on additional specialty medications: Yes  Additional Specialty Medications: tacrolimus 1 MG capsule (PROGRAF)  Patient Reported Additional Medication X Missed Doses in the Last Month: 1  Patient is on more than two specialty medications: No  Any gaps in refill history greater than 2 weeks in the last 3 months: no  Demonstrates understanding of importance of adherence: yes  Informant: patient  Adherence tools used: patient uses a pill box to manage medications  Support network for adherence: family member  Confirmed plan for next specialty medication refill: delivery by pharmacy  Refills needed for supportive medications: not needed          Refill Coordination    Has the Patients' Contact Information Changed: No  Is the Shipping Address Different: No         Were doses missed due to medication being on hold? No    tacrolimus 1  mg: 7 days of medicine on hand   mycophenolate 180  mg: 7 days of medicine on hand       REFERRAL TO PHARMACIST     Referral to the pharmacist: Not needed      [facility]     Shipping address confirmed in Epic.     Cost and Payment: Patient has a $0 copay, payment information is not required.    Delivery Scheduled: Yes, Expected medication delivery date: 07/29/23.     Medication will be delivered via UPS to the prescription address in Epic WAM.    Benjamin Maldonado   [facility] Specialty and Home Delivery Pharmacy  Specialty Technician

## 2023-07-27 DIAGNOSIS — T8611 Kidney transplant rejection: Principal | ICD-10-CM

## 2023-07-28 ENCOUNTER — Ambulatory Visit: Admit: 2023-07-28 | Discharge: 2023-07-28 | Attending: Nephrology | Primary: Nephrology

## 2023-07-28 ENCOUNTER — Inpatient Hospital Stay: Admit: 2023-07-28 | Discharge: 2023-07-28 | Payer: Medicare (Managed Care)

## 2023-07-28 ENCOUNTER — Ambulatory Visit: Admit: 2023-07-28 | Discharge: 2023-07-28

## 2023-07-28 DIAGNOSIS — T8611 Kidney transplant rejection: Principal | ICD-10-CM

## 2023-07-28 DIAGNOSIS — N186 End stage renal disease: Principal | ICD-10-CM

## 2023-07-28 DIAGNOSIS — I10 Essential (primary) hypertension: Principal | ICD-10-CM

## 2023-07-28 DIAGNOSIS — Z940 Kidney transplant status: Principal | ICD-10-CM

## 2023-07-28 DIAGNOSIS — E1165 Type 2 diabetes mellitus with hyperglycemia: Principal | ICD-10-CM

## 2023-07-28 DIAGNOSIS — Z794 Long term (current) use of insulin: Principal | ICD-10-CM

## 2023-07-28 DIAGNOSIS — Z89512 Acquired absence of left leg below knee: Secondary | ICD-10-CM | POA: Diagnosis not present

## 2023-07-28 DIAGNOSIS — E1159 Type 2 diabetes mellitus with other circulatory complications: Secondary | ICD-10-CM | POA: Diagnosis not present

## 2023-07-28 DIAGNOSIS — N271 Small kidney, bilateral: Secondary | ICD-10-CM | POA: Diagnosis not present

## 2023-07-28 DIAGNOSIS — E1142 Type 2 diabetes mellitus with diabetic polyneuropathy: Secondary | ICD-10-CM | POA: Diagnosis not present

## 2023-07-28 DIAGNOSIS — E1151 Type 2 diabetes mellitus with diabetic peripheral angiopathy without gangrene: Secondary | ICD-10-CM | POA: Diagnosis not present

## 2023-07-28 DIAGNOSIS — E1121 Type 2 diabetes mellitus with diabetic nephropathy: Secondary | ICD-10-CM | POA: Diagnosis not present

## 2023-07-28 DIAGNOSIS — I132 Hypertensive heart and chronic kidney disease with heart failure and with stage 5 chronic kidney disease, or end stage renal disease: Secondary | ICD-10-CM | POA: Diagnosis not present

## 2023-07-28 DIAGNOSIS — I5032 Chronic diastolic (congestive) heart failure: Secondary | ICD-10-CM | POA: Diagnosis not present

## 2023-07-28 LAB — CBC W/ AUTO DIFF
BASOPHILS ABSOLUTE COUNT: 0 10*9/L (ref 0.0–0.1)
BASOPHILS RELATIVE PERCENT: 0.2 %
EOSINOPHILS ABSOLUTE COUNT: 0 10*9/L (ref 0.0–0.5)
EOSINOPHILS RELATIVE PERCENT: 0.4 %
HEMATOCRIT: 45.4 % (ref 39.0–48.0)
HEMOGLOBIN: 14.6 g/dL (ref 12.9–16.5)
LYMPHOCYTES ABSOLUTE COUNT: 1.5 10*9/L (ref 1.1–3.6)
LYMPHOCYTES RELATIVE PERCENT: 15.4 %
MEAN CORPUSCULAR HEMOGLOBIN CONC: 32.1 g/dL (ref 32.0–36.0)
MEAN CORPUSCULAR HEMOGLOBIN: 27 pg (ref 25.9–32.4)
MEAN CORPUSCULAR VOLUME: 84.3 fL (ref 77.6–95.7)
MEAN PLATELET VOLUME: 9.7 fL (ref 6.8–10.7)
MONOCYTES ABSOLUTE COUNT: 0.7 10*9/L (ref 0.3–0.8)
MONOCYTES RELATIVE PERCENT: 7.6 %
NEUTROPHILS ABSOLUTE COUNT: 7.3 10*9/L (ref 1.8–7.8)
NEUTROPHILS RELATIVE PERCENT: 76.4 %
PLATELET COUNT: 173 10*9/L (ref 150–450)
RED BLOOD CELL COUNT: 5.38 10*12/L (ref 4.26–5.60)
RED CELL DISTRIBUTION WIDTH: 14.8 % (ref 12.2–15.2)
WBC ADJUSTED: 9.5 10*9/L (ref 3.6–11.2)

## 2023-07-28 LAB — HEPATIC FUNCTION PANEL
ALBUMIN: 3.8 g/dL (ref 3.4–5.0)
ALKALINE PHOSPHATASE: 99 U/L (ref 46–116)
ALT (SGPT): 17 U/L (ref 10–49)
AST (SGOT): 15 U/L (ref <=34)
BILIRUBIN DIRECT: 0.2 mg/dL (ref 0.00–0.30)
BILIRUBIN TOTAL: 0.7 mg/dL (ref 0.3–1.2)
PROTEIN TOTAL: 7 g/dL (ref 5.7–8.2)

## 2023-07-28 LAB — PROTEIN / CREATININE RATIO, URINE
CREATININE, URINE: 353.2 mg/dL
PROTEIN URINE: 319.9 mg/dL
PROTEIN/CREAT RATIO, URINE: 0.906

## 2023-07-28 LAB — BASIC METABOLIC PANEL
ANION GAP: 11 mmol/L (ref 5–14)
BLOOD UREA NITROGEN: 23 mg/dL (ref 9–23)
BUN / CREAT RATIO: 16
CALCIUM: 10.1 mg/dL (ref 8.7–10.4)
CHLORIDE: 102 mmol/L (ref 98–107)
CO2: 27.8 mmol/L (ref 20.0–31.0)
CREATININE: 1.41 mg/dL — ABNORMAL HIGH (ref 0.73–1.18)
EGFR CKD-EPI (2021) MALE: 62 mL/min/1.73m2 (ref >=60)
GLUCOSE RANDOM: 178 mg/dL — ABNORMAL HIGH (ref 70–99)
POTASSIUM: 4.7 mmol/L (ref 3.4–4.8)
SODIUM: 141 mmol/L (ref 135–145)

## 2023-07-28 LAB — URINALYSIS WITH MICROSCOPY
BILIRUBIN UA: NEGATIVE
HYALINE CASTS: 1 /LPF (ref 0–1)
KETONES UA: NEGATIVE
LEUKOCYTE ESTERASE UA: NEGATIVE
NITRITE UA: NEGATIVE
PH UA: 6 (ref 5.0–9.0)
PROTEIN UA: 300 — AB
RBC UA: 7 /HPF — ABNORMAL HIGH (ref <=3)
SPECIFIC GRAVITY UA: 1.03 (ref 1.003–1.030)
SQUAMOUS EPITHELIAL: <1 /HPF (ref 0–5)
UROBILINOGEN UA: <2
WBC UA: 1 /HPF (ref <=2)

## 2023-07-28 LAB — LIPID PANEL
CHOLESTEROL/HDL RATIO SCREEN: 3 (ref 1.0–4.5)
CHOLESTEROL: 136 mg/dL (ref <=200)
HDL CHOLESTEROL: 45 mg/dL (ref 40–60)
LDL CHOLESTEROL CALCULATED: 67 mg/dL (ref 40–99)
NON-HDL CHOLESTEROL: 91 mg/dL (ref 70–130)
TRIGLYCERIDES: 118 mg/dL (ref 0–150)
VLDL CHOLESTEROL CAL: 23.6 mg/dL (ref 11–50)

## 2023-07-28 LAB — PARATHYROID HORMONE (PTH): PARATHYROID HORMONE INTACT: 228.2 pg/mL — ABNORMAL HIGH (ref 18.4–80.1)

## 2023-07-28 LAB — HEMOGLOBIN A1C
ESTIMATED AVERAGE GLUCOSE: 226 mg/dL
HEMOGLOBIN A1C: 9.5 % — ABNORMAL HIGH (ref 4.8–5.6)

## 2023-07-28 LAB — PHOSPHORUS: PHOSPHORUS: 2.4 mg/dL (ref 2.4–5.1)

## 2023-07-28 LAB — ALBUMIN / CREATININE URINE RATIO
ALBUMIN QUANT URINE: 185.3 mg/dL
ALBUMIN/CREATININE RATIO: 524.6 ug/mg — ABNORMAL HIGH (ref 0.0–30.0)
CREATININE, URINE: 353.2 mg/dL

## 2023-07-28 LAB — MAGNESIUM: MAGNESIUM: 1.4 mg/dL — ABNORMAL LOW (ref 1.6–2.6)

## 2023-07-28 MED ORDER — BLOOD-GLUCOSE SENSOR DEVICE
3 refills | 0.00 days | Status: CP
Start: 2023-07-28 — End: ?

## 2023-07-28 MED ORDER — OLMESARTAN 5 MG TABLET
ORAL_TABLET | Freq: Every day | ORAL | 3 refills | 100.00 days | Status: CP
Start: 2023-07-28 — End: 2024-08-31

## 2023-07-28 MED ORDER — PEN NEEDLE, DIABETIC 32 GAUGE X 5/32" (4 MM)
PRN refills | 0.00 days | Status: CP
Start: 2023-07-28 — End: 2024-07-27

## 2023-07-28 MED ORDER — EMPAGLIFLOZIN 10 MG TABLET
ORAL_TABLET | Freq: Every day | ORAL | 3 refills | 90.00 days | Status: CP
Start: 2023-07-28 — End: ?

## 2023-07-28 MED ORDER — MAGNESIUM OXIDE 400 MG (241.3 MG MAGNESIUM) TABLET
ORAL_TABLET | Freq: Two times a day (BID) | ORAL | 3 refills | 90.00 days | Status: CP
Start: 2023-07-28 — End: 2024-07-27

## 2023-07-28 MED FILL — TACROLIMUS 1 MG CAPSULE, IMMEDIATE-RELEASE: ORAL | 30 days supply | Qty: 360 | Fill #3

## 2023-07-28 MED FILL — OMEPRAZOLE 20 MG CAPSULE,DELAYED RELEASE: ORAL | 30 days supply | Qty: 30 | Fill #9

## 2023-07-28 MED FILL — MYCOPHENOLATE SODIUM 180 MG TABLET,DELAYED RELEASE: ORAL | 30 days supply | Qty: 180 | Fill #1

## 2023-07-28 MED FILL — METOPROLOL TARTRATE 50 MG TABLET: ORAL | 30 days supply | Qty: 90 | Fill #10

## 2023-07-28 NOTE — Unmapped (Signed)
 Met w/ patient in [facility] today. Reviewed meds/symptoms. Any new medications?                 Fever/cold/flu symptoms denies  BP: 153/91 today/ Home BP reported 138-149/70-80  BG: A1C today 9.5  Headache/Dizziness/Lightheaded: denies  Hand tremors: denies  Numbness/tingling: nothing new  Fevers/chills/sweats: denies  CP/SOB/palpatations: denies  Nausea/vomiting/heartburn: denies, controlled w/ Prilosec  Diarrhea/constipation: denies  UTI symptoms (burn/pain/itch/frequency/urgency/odor/color/foam): denies  No visible or palpable edema     Appetite good; reports adequate hydration.      Pt reports being well rested and getting adequate exercise.     Continues to follow Covid/health safety precautions by taking care to mask, perform frequent hand hygeine and minimal public activity. Offered support and guidance for this process given their immune-suppressed state. We discussed reduced covid vaccine coverage for transplant patients and importance of continuing to mask and practice safe distancing. Commented that booster vaccines will likely be advised as an ongoing process.     Pain: denies     Last Tac taken 2120; held for this morning's labs. Current dose 6 mg bid; Myfortic 540mg  bid     Other complaints or concerns: needs assistance w/ BG monitoring supplies    Immunization status: UTD for now     Functional Score: 100    Referrals sent to Endocrinology and Cardiology and [facility]

## 2023-07-28 NOTE — Unmapped (Addendum)
-   colonoscopy number to call for scheduling: [phone]  - New medications:   - olmesartan 10 mg daily   - empagliflozin 10 mg daily  - will need to get repeat labs in 2 weeks (around 4/24) to reassess kidney function & potassium levels

## 2023-07-28 NOTE — Unmapped (Signed)
 Transplant Nephrology Clinic Visit    Assessment    Benjamin Maldonado is a 48 y.o. 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 Active medical issues include:    Status post kidney transplant with no history of rejection or diabetic nephropathy recurrence  - Serum creatinine is stable at 1.41 mg/dL  (baseline <1.6 mg/dL).   - No history of rejection or proteinuria or donor specific antibodies (last DSA negative 07/15/2021)----follow-up repeat DSAs  - UPC today 0.906, stable since 2023   - likely 2/2 from diabetic nephropathy, however, patient would like to pursue renal biopsy to ensure this is not rejection  - UA w/ proteinuria, hematuria, & bacteria---no pyuria    Immunosuppression management.   - Tacrolimus  level is pending and will be a ~16 hour trough (target 12 hour trough is 5-8).  - Continue tacrolimus  6 mg bid and Myfortic  540 mg twice daily.    Bilateral pleural effusions  - Hospitalized in 12/2019 and 02/2020 with dyspnea and pleural effusion  - thoracentesis fluid analysis exudative with negative cytology and cultures  - s/p left pleurodesis 03/05/20  - CXR today negative    PAD, s/p left BKA.  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    - BP 153/91 today. Home BP 130-140s/70-80s  - Renal transplant US  (10/31/18) showed the anastomotic velocity to be improved compared to past studies.  - Repeat renal transplant US  (03/25/2023): unremarkable  - start Benicar  10 mg daily    Diabetes mellitus, type 2  - A1C 9.5% today  - saw endocrine in 04/2023   - increasing Lantus  by 2 units daily till fasting BG <140   - continuing SSI   - started Ozempic 0.25 mg weekly -> increase to 0.5 mg after that (on 0.5 mg due to issues with vomiting)  - starting Jardiance  10 mg daily    Hyperlipidemia.   - Continue Lipitor    Coronary artery disease.   - Denies symptoms of cardiac ischemia since 04/2021 admission  - Cardiac cath 05/04/21 with all lesions <50%  - Continue beta blocker therapy, aspirin, statin therapyestive of cardiac ischemia.   - looking for local cardiologist    Cerebrovascular disease  - Head CT 2021 with old left frontal lobe infarct,     COVID-19 infection with RLL pneumonia 11/16/19  - S/P Regeneron monoclonal Ab therapy 11/20/19  - Has recovered well  - COVID-19 vaccination eligible 02/20/20 and he has completed 1 dose of vaccine with plans to complete 3 dose primary series.    Immunizations/Infection prevention.   - Prevnar 13 (03/23/17).   - Flu vaccine UTD  - Pneumovax 05/09/19  - COVID-19 vaccine UTD 02/2023    Follow-up.   Transplant clinic in 1 month    History of Present Illness    Benjamin Maldonado is a 48 y.o. 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  He has no history of rejection or DSAs with baseline creatinine of 1.3-1.8 mg/dL. He is seen today for annual follow up and was last seen  in transplant clinic 05/10/20.      Hospitalizations over the past 18 months include:   1) 11/16/19-12/18/19 [facility] with cough, fever, tachycardia, RLL pneumonia and was found to have bacteremia (group B strep) and COVID-19 PCR positivity. Treated with IV antibiotics and Regeneron monoclonal Ab therapy. Found to have bilateral foot ulcers and evidence of osteomyelitis (culture with proteus and strep agalactiae). He underwent left BKA and debridement of right foot wounds on 11/21/19 treated with prolonged course of Unasyn then Augmentin.     2) 01/09/20-01/11/20- [facility] with large left pleural effusion. Thoracentesis with exudative effusion. 3) 02/21/20-03/09/20 - [facility] with dyspnea and bilateral pleural effusions (large left, moderate right) requiring multiple thoracenteses and left pleurodesis with fluid consistent with exudate with negative cytology for malignancy and negative cultures. 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     4) 10/04/20-10/13/20 [facility] with nausea, vomiting, diarrhea, glucose 850, left stump drainage with cultures positive for MSSA. Underwent debridement with no evidence of osteomyelitis at the stump site. Left chest wall dermal abscess was also drained.     5) 05/02/21-05/05/21 [facility] for chest pain. Cardiac cath 05/04/21 with 20% mid RCA, 20% mid LAD, 30% mid-LAD 2, 50% first diagonal. Echo with EF 55-60%, borderline LVH. Recommended medical management including statin, aspirin, prn NTG.     Doing well overall. Denied any symptoms. Having no issues with IS or compliance    Immunosuppression is unchanged. His last dose of tac was at 7 PM    Kidney Transplant History     1. 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 Deceased donor kidney transplant 11/08/2014.  DCD kidney donor with KDPI 43%, CMV D-R-, EBV D+R+  3. Donor with asphyxiation and serratia isolated on donor tracheal aspirate and donor blood cultures with S. Epidermidis bacteremia. Patient treated with Vancomycin for 2 weeks.  4. Delayed graft function with dialysis due to hyperkalemia  5. Induction agent = campath, solumedrol with rapid steroid discontinuation  6. Maintenance immunosuppression = tacrolimus , mycophenolate  sodium  7. Stent removed 12/11/2014.  8. Kidney biopsy 11/12/2015 due to acute allograft dysfunction with focal mild ATN and no other pathology.  9. Renal US  09/24/2016 with possible transplant anastomosis renal artery stenosis.     Other Medical History      1. Type 2 diabetes mellitus, onset 1991.   2. Hypertension of several years duration.  3. Coronary artery disease, s/p PCI to the RCA in 2009, s/p DES to LAD 04/30/2011, s/p cardiac cath 05/04/21 with 20% mid RCA, 20% mid LAD, 30% mid-LAD 2, 50% first diagonal.  4. Left ventricular hypertrophy with diastolic dysfunction noted on echocardiogram 09/2014 and mild LVH 04/2021.  5. Hyperlipidemia  6. Diabetic retinopathy with right eye blindness  7. Diabetic peripheral neuropathy  8. Frozen left shoulder  9. Burn right foot 08/2017  10. COVID-19 10/2019  11. PAD, s/p left BKA 11/2019  12. Bilateral exudative pleural effusions, negative for infection or malignancy, s/p pleurodesis 03/05/20     Review of Systems    Otherwise as per HPI, all other systems reviewed and are negative.    Medications  Current Outpatient Medications   Medication Sig Dispense Refill    acetaminophen (TYLENOL) 325 MG tablet Take 2 tablets (650 mg total) by mouth every six (6) hours as needed for pain. 100 tablet 2    aspirin (ADULT LOW DOSE ASPIRIN) 81 MG tablet  Take 1 tablet (81 mg total) by mouth daily. 30 tablet 11    atorvastatin  (LIPITOR) 20 MG tablet Take 1 tablet (20 mg total) by mouth daily. 90 tablet 3    blood sugar diagnostic Strp by Other route Four (4) times a day (before meals and nightly). One Touch 100 strip 5    blood-glucose meter (GLUCOSE MONITORING KIT) kit Use as instructed (Patient not taking: Reported on 07/15/2021) 1 each 0    dorzolamide -timolol  (COSOPT ) 22.3-6.8 mg/mL ophthalmic solution Administer 1 drop into the left eye Two (2) times a day. 10 mL 12    GLUC.METER,DIS.P-LOADED STRIPS (GLUCOSE METER, DISP & STRIPS MISC) Frequency:PHARMDIR   Dosage:0.0     Instructions:  Note:250.00, life long Dose: 1      insulin  aspart (NOVOLOG  FLEXPEN U-100 INSULIN ) 100 unit/mL (3 mL) injection pen Inject 4 units under skin with breakfast, 8 units with lunch, and 6 units with supper and sliding scale. Max units 54 per day. 30 mL 11    insulin  glargine (LANTUS  SOLOSTAR U-100 INSULIN ) 100 unit/mL (3 mL) injection pen Inject 0.16 mL (16 Units total) under the skin nightly. 15 mL 5    magnesium  oxide (MAG-OX) 400 mg (241.3 mg magnesium ) tablet Take 1 tablet (400 mg total) by mouth.      metoPROLOL  tartrate (LOPRESSOR ) 50 MG tablet Take 1 and 1/2 tablets (75 mg total) by mouth Two (2) times a day. 90 tablet 11    mycophenolate  (MYFORTIC ) 180 MG EC tablet Take 3 tablets (540 mg total) by mouth two (2) times a day. 180 tablet 11    omeprazole  (PRILOSEC) 20 MG capsule Take 1 capsule (20 mg total) by mouth daily. 30 capsule 11    pen needle, diabetic (BD ULTRA-FINE NANO PEN NEEDLE) 32 gauge x 5/32 (4 mm) Ndle Use to administer insulin  4 times daily. 100 each PRN    tacrolimus  (PROGRAF ) 1 MG capsule Take 6 capsules (6 mg total) by mouth two (2) times a day. 360 capsule 11     No current facility-administered medications for this visit.       Physical Exam  There were no vitals taken for this visit.  General: Patient is a pleasant male in no apparent distress.  Eyes: Sclera anicteric.  Neck:  supple .  Lungs:  no distress; on RA .  Cardiovascular:  warm extremities .  Abdomen: Soft, notender/nondistended. Positive bowel sounds. No hepatosplenomegaly, masses or bruits appreciated.  Extremities:  left BKA with prosthesis, right LE with 1+ edema  Skin:  stump wound site not examined today  Neurological: Grossly nonfocal.  Psychiatric: Mood and affect appropriate.     Laboratory Data and Imaging Reviewed in EMR

## 2023-07-28 NOTE — Unmapped (Signed)
 Per Dr Ferman Houston, pt will need biopsy to rule out diabetic nephropathy. Message sent to primary TNC to call and schedule biopsy. Pt left clinic before I could discuss available dates.

## 2023-07-29 DIAGNOSIS — T8611 Kidney transplant rejection: Principal | ICD-10-CM

## 2023-07-29 DIAGNOSIS — Z940 Kidney transplant status: Principal | ICD-10-CM

## 2023-07-29 LAB — TACROLIMUS LEVEL, TROUGH: TACROLIMUS, TROUGH: 8.3 ng/mL (ref 5.0–15.0)

## 2023-07-29 LAB — CMV DNA, QUANTITATIVE, PCR: CMV VIRAL LD: NOT DETECTED

## 2023-07-29 LAB — BK VIRUS QUANTITATIVE PCR, BLOOD: BK BLOOD RESULT: NOT DETECTED

## 2023-07-29 NOTE — Unmapped (Signed)
 Called and talked to patient about renal biopsy. He will talk to his sister about what day she can bring him for a biopsy. He will call back with dates

## 2023-07-29 NOTE — Unmapped (Signed)
 Patient called back with sister and confirm that he can come on Friday April 25th.    Reviewed instructions with patient and sister. He will start holding ASA when he fills his pill box on the week of his biopsy, he will be NPO, arrive at 7am, and bring AM meds with him    Added to biopsy list    Email sent

## 2023-07-30 LAB — VITAMIN D 25 HYDROXY: VITAMIN D, TOTAL (25OH): 8.3 ng/mL — ABNORMAL LOW (ref 20.0–80.0)

## 2023-08-04 NOTE — Unmapped (Signed)
 [facility] Nephrology and Hypertension  Kidney Transplant Biopsy Note    Date of Biopsy Referral: 08/12/23    Referring Transplant Provider: Ferman Houston  Pager: [phone]  Phone: [phone]    Kidney Transplant Coordinator: Clyde Darling      ---------------------------------------------------------------------------------------------------------------------  PLEASE INDICATED BIOPSY URGENCY:  ROUTINE      Patient Name: Benjamin Maldonado  MR: [id number]  Age: 48 y.o.  Sex: Male Race: Black/African American Ethnicity: Not Hispanic, Latino/a, or Spanish origin   DOB:1976/04/14    Procedures: Ultrasound Guided Percutaneous Kidney Biopsy under Moderate Sedation  Tissue Submitted: Kidney  Special Studies Required: LM, IF, EM  ----------------------------------------------------------------------------------------------------------------------  Date of allograft implantation: 11/08/2014 (Kidney)  ABO Incompatible: No  cPRA: No results found for: CPRA  Underlying native kidney disease:Diabetes Mellitus - Type II,   Was the diagnosis established by biopsy? No  Previous transplant biopsies: No  Date of most recent biopsy:  If yes, what were the previous diagnoses on the most recent biopsy?   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 Has new onset proteinuria (UPC 0.96) with stable kidney function. No history of DSAs or BK nephropathy.      ----------------------------------------------------------------------------------------------------------------------  Current Baseline Immunosuppression: tacrolimus  (Prograf  or Envarsus ) and mycophenolate  (Cellcept or Myfortic ) (please select ALL drugs used)  Specific anti-rejection treatment WITHIN 2 WEEKS before biopsy: No  If yes, what was the type of treatment?   Patient off immunosuppression?: No  Patient seems adherent to immunosuppression? Yes  Patient is currently back on dialysis? No  Has patient had any prior episodes of rejection? No   If yes, what was the treatment?   ----------------------------------------------------------------------------------------------------------------------  Donor Specific Antibody Results (if available):  Lab Results   Component Value Date    Donor ID [id number] 07/15/2021    Donor HLA-A Antigen #1 A2 07/15/2021    Anti-Donor HLA-A #1 MFI 48 07/15/2021    Donor HLA-A Antigen #2 A24 07/15/2021    Donor HLA-B Antigen #1 B7 07/15/2021    Anti-Donor HLA-B #1 MFI 28 07/15/2021    Donor HLA-B Antigen #2 B35 07/15/2021    Anti-Donor HLA-B #2 MFI 64 07/15/2021    Donor HLA-C Antigen #1 C4 07/15/2021    Anti-Donor HLA-C #1 MFI 29 07/15/2021    Donor HLA-C Antigen #2 C7 07/15/2021    Anti-Donor HLA-C #2 MFI 77 07/15/2021    Donor HLA-DR Antigen #1 DR4 07/15/2021    Anti-Donor HLA-DR #1 MFI 13 07/15/2021    Donor HLA-DR Antigen #2 DR8 07/15/2021    Anti-Donor HLA-DR #2 MFI 0 07/15/2021    Donor DRw Antigen #1 DR53 07/15/2021    Anti-Donor DRw #1 MFI 25 07/15/2021    Donor HLA-DQB Antigen #1 DQ8 07/15/2021    Anti-Donor HLA-DQB #1 MFI 6 07/15/2021    Donor HLA-DQB Antigen #2 DQ4 07/15/2021    Anti-Donor HLA-DQB #2 MFI 10 07/15/2021    DSA Comment  07/15/2021      Comment:      The HLA antigens listed are donor antigens and the corresponding MFI value.  MFI values >= 1000  are considered positive.  A negative result does not exclude the presence of low level DSA.  Blank donor antigen and MFI fields indicate unavailable donor HLA   type or lack of appropriate single antigen bead to determine DSA.  As such, the presence or absence of DSA to the locus cannot be confirmed.     Donor specific antibody (DSA) testing is performed with a  solid phase multiplex single antigen bead array method.  All procedures and reagents have been validated and performance characteristics determined by the [facility].    Certain of these tests have not been cleared/approved by the U.S. Food and Drug Administration (FDA).  The FDA has determined that such approval/clearance is not necessary because this laboratory is certified under the Clinical Laboratory Improvements   Amendments to perform high complexity testing.  This test is used for clinical purposes.  It should not be regarded as investigational or for research.  All HLA typings are performed using molecular methodologies.  HLA-A, B, C, DR, and DQ results are   serological equivalents of the molecular type.           Blood Pressure (mmHg):   BP Readings from Last 3 Encounters:   07/28/23 153/91   07/15/21 173/85   05/09/19 140/82     On Anti-hypertensive Therapy: Yes    Urinalysis:  Lab Results   Component Value Date    Color, UA Yellow 07/28/2023    Color, UA Yellow 11/07/2018    Specific Gravity, UA 1.030 07/28/2023    Specific Gravity, UA 1.023 11/07/2018    pH, UA 6.0 07/28/2023    pH, UA 6.0 11/07/2018    Glucose, UA Trace (A) 07/28/2023    Glucose, UA Negative 11/07/2018    Ketones, UA Negative 07/28/2023    Ketones, UA 1+ (A) 11/07/2018    Blood, UA Small (A) 07/28/2023    Blood, UA Negative 11/07/2018    Nitrite, UA Negative 07/28/2023    Nitrite, UA Negative 11/07/2018    Leukocyte Esterase, UA Negative 07/28/2023    Leukocyte Esterase, UA Negative 11/07/2018    Urobilinogen, UA <2.0 mg/dL 16/01/9603    Urobilinogen, UA 0.2 11/07/2018    Bilirubin, UA Negative 07/28/2023    Bilirubin, UA Negative 11/07/2018    WBC, UA 0-5 11/07/2018    RBC, UA 0-2 11/07/2018     Urine protein/creatinine ratio:   Lab Results   Component Value Date    Protein/Creatinine Ratio, Urine 0.906 07/28/2023       Creatinine:   Lab Results   Component Value Date CREATININE 1.41 (H) 07/28/2023    CREATININE 1.32 (H) 07/15/2021    CREATININE 1.45 (H) 05/09/2019    CREATININE 1.54 (H) 04/02/2019    CREATININE 1.53 (H) 01/11/2019       Glucose:   Lab Results   Component Value Date    Glucose 178 (H) 07/28/2023     HGBA1C:   Lab Results   Component Value Date    HB A1C, RAP/HGATE 6.8 (H) 01/13/2012    Hemoglobin A1c 8.4 (H) 11/07/2018    Hemoglobin A1C 9.5 (H) 07/28/2023     ----------------------------------------------------------------------------------------------------------------------  Viral studies at time of current biopsy:  BK blood viral load:   Lab Results   Component Value Date    BK Blood Result Not Detected 07/28/2023    BK Blood Quant Negative 11/07/2018    BK Blood Log(10) CANCELED 11/07/2018        CMV viral load:   Lab  Results   Component Value Date    CMV Viral Ld Not Detected 07/28/2023    CMV Quant Negative 08/01/2017        EBV viral load:   Lab Results   Component Value Date    Epstein-Barr DNA Quant, PCR Negative 11/07/2018    log10 EBV DNA Qn PCR CANCELED 11/07/2018       HSV: No  Hepatitis B: No  Hepatitis C: No    ----------------------------------------------------------------------------------------------------------------------  Stenosis of renal artery: No  Obstruction of ureter: No  Lymphocele: No    ----------------------------------------------------------------------------------------------------------------------  Other relevant labs if available:  Lab Results   Component Value Date    Color, UA Yellow 07/28/2023    Color, UA Yellow 11/07/2018    Specific Gravity, UA 1.030 07/28/2023    Specific Gravity, UA 1.023 11/07/2018    pH, UA 6.0 07/28/2023    pH, UA 6.0 11/07/2018    Glucose, UA Trace (A) 07/28/2023    Glucose, UA Negative 11/07/2018    Ketones, UA Negative 07/28/2023    Ketones, UA 1+ (A) 11/07/2018    Blood, UA Small (A) 07/28/2023    Blood, UA Negative 11/07/2018    Nitrite, UA Negative 07/28/2023    Nitrite, UA Negative 11/07/2018    Leukocyte Esterase, UA Negative 07/28/2023    Leukocyte Esterase, UA Negative 11/07/2018    Urobilinogen, UA <2.0 mg/dL 81/19/1478    Urobilinogen, UA 0.2 11/07/2018    Bilirubin, UA Negative 07/28/2023    Bilirubin, UA Negative 11/07/2018    WBC, UA 0-5 11/07/2018    RBC, UA 0-2 11/07/2018     Lab Results   Component Value Date    Sodium 141 07/28/2023    Sodium 139 04/02/2019    Potassium 4.7 07/28/2023    Potassium 4.5 04/02/2019    Chloride 102 07/28/2023    Chloride 106 04/02/2019    CO2 27.8 07/28/2023    CO2 21 04/02/2019    BUN 23 07/28/2023    BUN 23 04/02/2019    Creatinine 1.41 (H) 07/28/2023    Creatinine 1.54 (H) 04/02/2019    eGFR CKD-EPI (2021) Male 62 07/28/2023    Glucose 178 (H) 07/28/2023    Albumin 3.8 07/28/2023    Albumin 4.6 05/03/2013    Cholesterol 136 07/28/2023    Cholesterol, Total 164 11/07/2018     Lab Results   Component Value Date    Hepatitis A IgG Nonreactive 05/03/2013    Hep B Core Total Ab Nonreactive 09/24/2016    Hep B Core Total Ab Negative 05/03/2013    Hepatitis C Ab Nonreactive 09/24/2016    Hepatitis C Ab Negative 05/03/2013    HB A1C, RAP/HGATE 6.8 (H) 01/13/2012    Hemoglobin A1c 8.4 (H) 11/07/2018    Hemoglobin A1C 9.5 (H) 07/28/2023

## 2023-08-05 LAB — HLA DS POST TRANSPLANT
ANTI-DONOR DRW #1 MFI: 0 MFI
ANTI-DONOR HLA-A #1 MFI: 0 MFI
ANTI-DONOR HLA-A #2 MFI: 0 MFI
ANTI-DONOR HLA-B #1 MFI: 0 MFI
ANTI-DONOR HLA-B #2 MFI: 0 MFI
ANTI-DONOR HLA-C #1 MFI: 0 MFI
ANTI-DONOR HLA-C #2 MFI: 0 MFI
ANTI-DONOR HLA-DP AG #1 MFI: 0 MFI
ANTI-DONOR HLA-DQB #1 MFI: 174 MFI
ANTI-DONOR HLA-DQB #2 MFI: 0 MFI
ANTI-DONOR HLA-DR #1 MFI: 0 MFI
ANTI-DONOR HLA-DR #2 MFI: 0 MFI

## 2023-08-05 LAB — FSAB CLASS 2 ANTIBODY SPECIFICITY: HLA CL2 AB RESULT: NEGATIVE

## 2023-08-05 LAB — FSAB CLASS 1 ANTIBODY SPECIFICITY: HLA CLASS 1 ANTIBODY RESULT: NEGATIVE

## 2023-08-11 NOTE — Unmapped (Signed)
 Patient called re: ultrasound guided renal biopsy with moderate sedation. Patient informed to arrive at 7am, have responsible party accompany to appointment who can stay all day, and remain NPO after midnight including all medications; take 1/2 dose lantus  tonight.  Patient informed to bring all of their medications with them and they will be able to take once labs collected. Patient reports that they are on anticoagulation; last dose Aspirin 6 days ago.  Procedure explained and all questions answered

## 2023-08-11 NOTE — Unmapped (Signed)
 Patient called re: ultrasound guided renal biopsy with moderate sedation.  No answer, left message with call back number.

## 2023-08-12 ENCOUNTER — Encounter: Admit: 2023-08-12 | Discharge: 2023-08-13 | Payer: Medicare (Managed Care) | Attending: Nephrology | Primary: Nephrology

## 2023-08-12 ENCOUNTER — Inpatient Hospital Stay: Admit: 2023-08-12 | Discharge: 2023-08-13 | Payer: Medicare (Managed Care)

## 2023-08-12 DIAGNOSIS — S37011A Minor contusion of right kidney, initial encounter: Secondary | ICD-10-CM | POA: Diagnosis not present

## 2023-08-12 DIAGNOSIS — R7989 Other specified abnormal findings of blood chemistry: Secondary | ICD-10-CM | POA: Diagnosis not present

## 2023-08-12 DIAGNOSIS — Z79899 Other long term (current) drug therapy: Secondary | ICD-10-CM | POA: Diagnosis not present

## 2023-08-12 DIAGNOSIS — E1121 Type 2 diabetes mellitus with diabetic nephropathy: Secondary | ICD-10-CM | POA: Diagnosis not present

## 2023-08-12 DIAGNOSIS — Z7984 Long term (current) use of oral hypoglycemic drugs: Secondary | ICD-10-CM | POA: Diagnosis not present

## 2023-08-12 DIAGNOSIS — N186 End stage renal disease: Secondary | ICD-10-CM | POA: Diagnosis not present

## 2023-08-12 DIAGNOSIS — N189 Chronic kidney disease, unspecified: Secondary | ICD-10-CM | POA: Diagnosis not present

## 2023-08-12 DIAGNOSIS — I129 Hypertensive chronic kidney disease with stage 1 through stage 4 chronic kidney disease, or unspecified chronic kidney disease: Secondary | ICD-10-CM | POA: Diagnosis not present

## 2023-08-12 DIAGNOSIS — I2510 Atherosclerotic heart disease of native coronary artery without angina pectoris: Secondary | ICD-10-CM | POA: Diagnosis not present

## 2023-08-12 DIAGNOSIS — E1122 Type 2 diabetes mellitus with diabetic chronic kidney disease: Secondary | ICD-10-CM | POA: Diagnosis not present

## 2023-08-12 DIAGNOSIS — R809 Proteinuria, unspecified: Secondary | ICD-10-CM | POA: Diagnosis not present

## 2023-08-12 DIAGNOSIS — I120 Hypertensive chronic kidney disease with stage 5 chronic kidney disease or end stage renal disease: Secondary | ICD-10-CM | POA: Diagnosis not present

## 2023-08-12 DIAGNOSIS — Z940 Kidney transplant status: Secondary | ICD-10-CM | POA: Diagnosis not present

## 2023-08-12 DIAGNOSIS — Z79621 Long term (current) use of calcineurin inhibitor: Secondary | ICD-10-CM | POA: Diagnosis not present

## 2023-08-12 DIAGNOSIS — Z794 Long term (current) use of insulin: Secondary | ICD-10-CM | POA: Diagnosis not present

## 2023-08-12 LAB — BASIC METABOLIC PANEL
ANION GAP: 12 mmol/L (ref 5–14)
BLOOD UREA NITROGEN: 32 mg/dL — ABNORMAL HIGH (ref 9–23)
BUN / CREAT RATIO: 20
CALCIUM: 9.9 mg/dL (ref 8.7–10.4)
CHLORIDE: 104 mmol/L (ref 98–107)
CO2: 23 mmol/L (ref 20.0–31.0)
CREATININE: 1.59 mg/dL — ABNORMAL HIGH (ref 0.73–1.18)
EGFR CKD-EPI (2021) MALE: 54 mL/min/1.73m2 — ABNORMAL LOW (ref >=60)
GLUCOSE RANDOM: 216 mg/dL — ABNORMAL HIGH (ref 70–99)
POTASSIUM: 4.9 mmol/L — ABNORMAL HIGH (ref 3.4–4.8)
SODIUM: 139 mmol/L (ref 135–145)

## 2023-08-12 LAB — CBC
HEMATOCRIT: 44.3 % (ref 39.0–48.0)
HEMATOCRIT: 41.3 % (ref 39.0–48.0)
HEMOGLOBIN: 14.2 g/dL (ref 12.9–16.5)
HEMOGLOBIN: 13.1 g/dL (ref 12.9–16.5)
MEAN CORPUSCULAR HEMOGLOBIN CONC: 32.1 g/dL (ref 32.0–36.0)
MEAN CORPUSCULAR HEMOGLOBIN CONC: 31.6 g/dL — ABNORMAL LOW (ref 32.0–36.0)
MEAN CORPUSCULAR HEMOGLOBIN: 27.2 pg (ref 25.9–32.4)
MEAN CORPUSCULAR HEMOGLOBIN: 26.8 pg (ref 25.9–32.4)
MEAN CORPUSCULAR VOLUME: 84.8 fL (ref 77.6–95.7)
MEAN CORPUSCULAR VOLUME: 84.6 fL (ref 77.6–95.7)
MEAN PLATELET VOLUME: 10.2 fL (ref 6.8–10.7)
MEAN PLATELET VOLUME: 10.1 fL (ref 6.8–10.7)
PLATELET COUNT: 183 10*9/L (ref 150–450)
PLATELET COUNT: 162 10*9/L (ref 150–450)
RED BLOOD CELL COUNT: 5.22 10*12/L (ref 4.26–5.60)
RED BLOOD CELL COUNT: 4.88 10*12/L (ref 4.26–5.60)
RED CELL DISTRIBUTION WIDTH: 14.8 % (ref 12.2–15.2)
RED CELL DISTRIBUTION WIDTH: 14.8 % (ref 12.2–15.2)
WBC ADJUSTED: 8.7 10*9/L (ref 3.6–11.2)
WBC ADJUSTED: 7.4 10*9/L (ref 3.6–11.2)

## 2023-08-12 LAB — PROTIME-INR
INR: 0.9
PROTIME: 10.3 s (ref 9.9–12.6)

## 2023-08-12 LAB — APTT
APTT: 35.3 s (ref 24.8–38.4)
HEPARIN CORRELATION: 0.2

## 2023-08-12 MED ADMIN — fentaNYL (PF) (SUBLIMAZE) injection: INTRAVENOUS | @ 15:00:00 | Stop: 2023-08-12

## 2023-08-12 MED ADMIN — midazolam (PF) (VERSED) 1 mg/mL injection: INTRAVENOUS | @ 15:00:00 | Stop: 2023-08-12

## 2023-08-12 MED ADMIN — lidocaine (XYLOCAINE) 10 mg/mL (1 %) injection: SUBCUTANEOUS | @ 15:00:00 | Stop: 2023-08-12

## 2023-08-12 NOTE — Unmapped (Signed)
 KIDNEY BIOPSY PROCEDURE NOTE    INDICATIONS:  Elevated creatinine and Proteinuria    CONSENT/TIME OUT:    Risks, benefits, and alternatives were discussed with patient, risks including but not limited to blood loss requiring transfusion/procedural correction, loss of kidney or kidney function, infection, damage to surrounding structures, and death, as well as risks associated with sedation including but not limited to hypotension, bradycardia, and respiratory failure. Written informed consent was obtained prior to the procedure and is detailed in the medical record. Prior to the start of the procedure, a time out was taken and the identity of the patient was confirmed via name, medical record number and date of birth. The availability of the correct equipment was verified.     PROCEDURE: Ultrasound-guided percutaneous transplant kidney biopsy    The patient was given  1 mg of midazolam and 25 mcg of fentanyl during the procedure, and 8 mL lidocaine 1% were used for local anesthesia. Under ultrasound guidance, a 16G biopsy needle was inserted into the RLQ transplanted kidney for a total of 1 passes with 1 core specimens obtained for analysis.      COMPLICATIONS:  Small bleed after biopsy first pass. Pressure held for 5 min, active bleed stopped. Hematoma 3.5x2.2x1.2 appreciated.   Follow up RUS in 3 hours, CBC

## 2023-08-12 NOTE — Unmapped (Signed)
 Assessment/Plan:    Mr. Daker is a 48 y.o. male who will undergo Ultrasound-guided transplant kidney biopsy    1. Indications and risks/benefits of procedure reviewed with patient.    2. Consent signed and present on patient's charge.   3. No cardiopulmonary or other medical contraindications present therefore will proceed with biopsy.   4.  Surgical pathology has been consulted  5. BUN is 1.59 today, so the patient will not receive DDAVP    CC: [place] Biopsy Indication: Elevated Creatinine/Acute Kidney Injury    HPI: Mr. Kyle is a 48 y.o. male who will undergo Ultrasound-guided transplant kidney biopsy with moderate sedation. Currently feeling at baseline without recent changes in health. Pt denies  dysuria, urinary urgency, urinary frequency, fevers. Pt not currently taking anticoagulant/antiplatelet agents (stopped ASA 08/05/2023). Pt NPO since 10 pm 08/11/2023. Pt denies any history of complications related to anesthesia. Pt denies any prior reactions to opioid or benzodiazepines.     Allergies:  Iodine, Iodine and iodide containing products, and Shellfish containing products    Medications:   Prior to Admission medications    Medication Sig Start Date End Date Taking? Authorizing Provider   acetaminophen (TYLENOL) 325 MG tablet Take 2 tablets (650 mg total) by mouth every six (6) hours as needed for pain. 11/12/14  Yes Helena Loach, PA   atorvastatin (LIPITOR) 20 MG tablet Take 1 tablet (20 mg total) by mouth daily. 08/04/22 08/21/23 Yes Detwiler, Hampton Levins, MD   blood-glucose meter (GLUCOSE MONITORING KIT) kit Use as instructed 11/13/14  Yes Vonderau, Shawnee Dellen, PA   blood-glucose sensor Devi Wear dexcom sensor for blood glucose monitoring. Change sensor every 10 days 07/28/23  Yes Detwiler, Hampton Levins, MD   dorzolamide-timolol (COSOPT) 22.3-6.8 mg/mL ophthalmic solution Administer 1 drop into the left eye Two (2) times a day. 09/29/22  Yes Detwiler, Hampton Levins, MD   empagliflozin (JARDIANCE) 10 mg tablet Take 1 tablet (10 mg total) by mouth daily. 07/28/23  Yes Monk, Brian C, DO   GLUC.METER,DIS.P-LOADED STRIPS (GLUCOSE METER, DISP & STRIPS MISC) Frequency:PHARMDIR   Dosage:0.0     Instructions:  Note:250.00, life long Dose: 1 01/13/12  Yes [provider]   insulin aspart (NOVOLOG FLEXPEN U-100 INSULIN) 100 unit/mL (3 mL) injection pen Inject 4 units under skin with breakfast, 8 units with lunch, and 6 units with supper and sliding scale. Max units 54 per day. 03/07/23  Yes Detwiler, Hampton Levins, MD   insulin glargine (LANTUS SOLOSTAR U-100 INSULIN) 100 unit/mL (3 mL) injection pen Inject 0.16 mL (16 Units total) under the skin nightly. 03/09/23  Yes Detwiler, Hampton Levins, MD   magnesium oxide (MAG-OX) 400 mg (241.3 mg elemental magnesium) tablet Take 1 tablet (400 mg total) by mouth two (2) times a day. 07/28/23 07/27/24 Yes Detwiler, Hampton Levins, MD   metoPROLOL tartrate (LOPRESSOR) 50 MG tablet Take 1 and 1/2 tablets (75 mg total) by mouth Two (2) times a day. 08/04/22 08/27/23 Yes Detwiler, Hampton Levins, MD   mycophenolate (MYFORTIC) 180 MG EC tablet Take 3 tablets (540 mg total) by mouth two (2) times a day. 06/23/23 06/22/24 Yes Detwiler, Hampton Levins, MD   olmesartan (BENICAR) 5 MG tablet Take 2 tablets (10 mg total) by mouth daily. 07/28/23 08/31/24 Yes Danae Duncans C, DO   omeprazole (PRILOSEC) 20 MG capsule Take 1 capsule (20 mg total) by mouth daily. 09/02/22 09/02/23 Yes Detwiler, Hampton Levins, MD   pen needle, diabetic (BD ULTRA-FINE NANO PEN NEEDLE) 32 gauge x  5/32 (4 mm) Ndle Use to administer insulin 4 times daily. 07/28/23 07/27/24 Yes Detwiler, Hampton Levins, MD   tacrolimus (PROGRAF) 1 MG capsule Take 6 capsules (6 mg total) by mouth two (2) times a day. 03/07/23 03/06/24 Yes Detwiler, Hampton Levins, MD   aspirin (ADULT LOW DOSE ASPIRIN) 81 MG tablet Take 1 tablet (81 mg total) by mouth daily. 11/18/14 05/08/20  Henreitta Locus, PharmD   blood sugar diagnostic Strp by Other route Four (4) times a day (before meals and nightly). 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 He has never used smokeless tobacco.  Alcohol use:   reports no history of alcohol use.  Drug use:  reports no history of drug use.    Family History:  The patient's family history is not on file..    ASA Grade: ASA 2 - Patient with mild systemic disease with no functional limitations    PE:    Vitals:    08/12/23 0811   BP: 143/86   Pulse: 92   Resp: 18   Temp: 36.9 ??C (98.4 ??F)   SpO2: 97%     General:  well appearing male in NAD.  HEENT: conjunctival anicteric  Airway assessment: Class 2 - Can visualize soft palate and fauces, tip of uvula is obscured  Cardiovascular:  RRR nl s1/s2 no s3/s4 no m/r  Lungs: CTAB w/o adventitious sounds, no incr WOB  Psych: alert, engaged, appropriate mood and affect  MSK: trace edema RLE, LLE BKA    Franceen Inches, MD

## 2023-08-14 NOTE — Unmapped (Signed)
 Imaging: Spoke to patient, he stated that everything is going good. No questions nor concerns.

## 2023-08-17 ENCOUNTER — Ambulatory Visit: Payer: Medicare PPO | Admitting: Podiatry

## 2023-08-18 DIAGNOSIS — T8611 Kidney transplant rejection: Principal | ICD-10-CM

## 2023-08-18 NOTE — Unmapped (Signed)
 Reviewed biopsy results with Dr Desmond Florida and updated patient on results and confirmed that he did start jardiance and olmesartan. He is working on controlling his BG better at home    He denied any other questions or concerns

## 2023-08-19 DIAGNOSIS — G4733 Obstructive sleep apnea (adult) (pediatric): Secondary | ICD-10-CM | POA: Diagnosis not present

## 2023-08-24 ENCOUNTER — Encounter: Payer: Self-pay | Admitting: Podiatry

## 2023-08-24 ENCOUNTER — Ambulatory Visit (INDEPENDENT_AMBULATORY_CARE_PROVIDER_SITE_OTHER): Admitting: Podiatry

## 2023-08-24 DIAGNOSIS — Z794 Long term (current) use of insulin: Secondary | ICD-10-CM | POA: Diagnosis not present

## 2023-08-24 DIAGNOSIS — B351 Tinea unguium: Secondary | ICD-10-CM

## 2023-08-24 DIAGNOSIS — M79674 Pain in right toe(s): Secondary | ICD-10-CM | POA: Diagnosis not present

## 2023-08-24 DIAGNOSIS — E1169 Type 2 diabetes mellitus with other specified complication: Secondary | ICD-10-CM

## 2023-08-24 MED ORDER — METOPROLOL TARTRATE 50 MG TABLET
ORAL_TABLET | Freq: Two times a day (BID) | ORAL | 11 refills | 30.00000 days
Start: 2023-08-24 — End: 2024-08-23

## 2023-08-24 MED ORDER — ATORVASTATIN 20 MG TABLET
ORAL_TABLET | Freq: Every day | ORAL | 3 refills | 90.00000 days
Start: 2023-08-24 — End: 2024-08-23

## 2023-08-24 NOTE — Progress Notes (Signed)
 This patient returns to my office for at risk foot care.  This patient requires this care by a professional since this patient will be at risk due to having diabetes and kidney disease. He has amputation left leg  BK.  This patient is unable to cut nails himself since the patient cannot reach his nails.These nails are painful walking and wearing shoes. He presents to the office with his wife. This patient presents for at risk foot care today.  General Appearance  Alert, conversant and in no acute stress.  Vascular  Dorsalis pedis and posterior tibial  pulses are palpable  bilaterally.  Capillary return is within normal limits  bilaterally. Temperature is within normal limits  bilaterally.  Neurologic  Senn-Weinstein monofilament wire test within normal limits  bilaterally. Muscle power within normal limits bilaterally.  Nails Thick disfigured discolored nails with subungual debris  from hallux to fifth toes bilaterally. No evidence of bacterial infection or drainage bilaterally.  Orthopedic  No limitations of motion  feet .  No crepitus or effusions noted.  No bony pathology or digital deformities noted.  Skin  normotropic skin with no porokeratosis noted bilaterally.  No signs of infections or ulcers noted.     Onychomycosis  Pain in right toes  Pain in left toes  Consent was obtained for treatment procedures.   Mechanical debridement of nails 1-5  bilaterally performed with a nail nipper.  Filed with dremel without incident.    Return office visit    3 months                  Told patient to return for periodic foot care and evaluation due to potential at risk complications.   Helane Gunther DPM

## 2023-08-24 NOTE — Unmapped (Signed)
 [facility] Refill Coordination Note    Specialty Medication(s) to be Shipped:   Transplant: mycophenolate mofetil 180mg  and tacrolimus 1mg     Other medication(s) to be shipped: metoprolol  ,omeprazole,  atorvastatin  , lantus  ,  novolog        Benjamin Maldonado, DOB: 08-09-1975  Phone: [phone] (home)       All above HIPAA information was verified with patient.     Was a Nurse, learning disability used for this call? No    Completed refill call assessment today to schedule patient's medication shipment from the [facility] and Home Delivery Pharmacy  [phone]).  All relevant notes have been reviewed.     Specialty medication(s) and dose(s) confirmed: Regimen is correct and unchanged.   Changes to medications: [name] reports no changes at this time.  Changes to insurance: No  New side effects reported not previously addressed with a pharmacist or physician: None reported  Questions for the pharmacist: No    Confirmed patient received a Conservation officer, historic buildings and a Surveyor, mining with first shipment. The patient will receive a drug information handout for each medication shipped and additional FDA Medication Guides as required.       DISEASE/MEDICATION-SPECIFIC INFORMATION        N/A    SPECIALTY MEDICATION ADHERENCE     Medication Adherence    Patient reported X missed doses in the last month: 1  Specialty Medication: mycophenolate 180 MG EC tablet (MYFORTIC)  Patient is on additional specialty medications: Yes  Additional Specialty Medications: tacrolimus 1 MG capsule (PROGRAF)  Patient Reported Additional Medication X Missed Doses in the Last Month: 1  Patient is on more than two specialty medications: No  Any gaps in refill history greater than 2 weeks in the last 3 months: no  Demonstrates understanding of importance of adherence: yes  Adherence tools used: patient uses a pill box to manage medications  Support network for adherence: family member                Were doses missed due to medication being on hold? No    tacrolimus 1  mg: 7 days of medicine on hand   mycophenolate 180  mg: 7 days of medicine on hand       REFERRAL TO PHARMACIST     Referral to the pharmacist: Not needed      [facility]     Shipping address confirmed in Epic.     Cost and Payment: Patient has a $0 copay, payment information is not required.    Delivery Scheduled: Yes, Expected medication delivery date: 08/30/23.     Medication will be delivered via UPS to the prescription address in Epic WAM.    Eleanora Grew   [facility] Delivery [facility]  Specialty Technician

## 2023-08-25 DIAGNOSIS — Z940 Kidney transplant status: Principal | ICD-10-CM

## 2023-08-25 DIAGNOSIS — N186 End stage renal disease: Principal | ICD-10-CM

## 2023-08-25 MED ORDER — METOPROLOL TARTRATE 50 MG TABLET
ORAL_TABLET | Freq: Two times a day (BID) | ORAL | 11 refills | 30.00000 days | Status: CP
Start: 2023-08-25 — End: 2024-08-24
  Filled 2023-08-29: qty 90, 30d supply, fill #0

## 2023-08-25 MED ORDER — ATORVASTATIN 20 MG TABLET
ORAL_TABLET | Freq: Every day | ORAL | 3 refills | 90.00000 days | Status: CP
Start: 2023-08-25 — End: 2024-08-24
  Filled 2023-08-29: qty 90, 90d supply, fill #0

## 2023-08-29 MED FILL — TACROLIMUS 1 MG CAPSULE, IMMEDIATE-RELEASE: ORAL | 30 days supply | Qty: 360 | Fill #4

## 2023-08-29 MED FILL — LANTUS SOLOSTAR U-100 INSULIN 100 UNIT/ML (3 ML) SUBCUTANEOUS PEN: SUBCUTANEOUS | 93 days supply | Qty: 15 | Fill #1

## 2023-08-29 MED FILL — MYCOPHENOLATE SODIUM 180 MG TABLET,DELAYED RELEASE: ORAL | 30 days supply | Qty: 180 | Fill #2

## 2023-08-29 MED FILL — NOVOLOG FLEXPEN U-100 INSULIN ASPART 100 UNIT/ML (3 ML) SUBCUTANEOUS: SUBCUTANEOUS | 55 days supply | Qty: 30 | Fill #3

## 2023-08-29 MED FILL — OMEPRAZOLE 20 MG CAPSULE,DELAYED RELEASE: ORAL | 30 days supply | Qty: 30 | Fill #10

## 2023-08-31 NOTE — Unmapped (Unsigned)
 DIVISION OF CARDIOLOGY  [facility] , [place]  Date of Service: 09/01/2023    PCP: Referring Provider:   Suzzanne Estrin, MD  [phone] [facility]  [place] [address]  Phone: [phone]  Fax: [phone] Detwiler, Hampton Levins, MD  [address]  [address]  [place],  [place] [address]  Phone: [phone]  Fax: [phone]     Assessment and Plan:     Benjamin Maldonado is a 48 y.o. male with *** who presents today in consultation for ***.    ***: ***    ***: ***    ***: ***    No follow-ups on file.    The patient was seen and discussed with Dr. Lilton Relic.     Subjective:     Reason for consultation: ***    Brief HPI: [name] is a 48 y.o. male with PMH as below who is seen at the request of Randal Steffi Edu.    He has a hx of T2DM c/b ESRD s/p DDKT in 2016 , non obstructive CAD , PAD with prior L BKA , HTN , HLD.    Recently has developed some increased proteinuria and underwent kidney Bx that showed diabetic involvement.     His last LDL 67 in April 2025 , A1c 9.5 in April 2025     ROS: 10 systems were reviewed and negative except as noted in HPI.    Cardiovascular risk factors:  dyslipidemia, hypertension, diabetes, and chronic kidney disease    Cardiovascular diagnoses:  Hypertension  Dyslipidemia  CAD  Prior MI  CVA/TIA  PAD  DM  CKD/ESRD    Cardiovascular History & Procedures:   Echocardiogram:   TTE 2023    Left Ventricle: Left ventricle size is normal. Findings consistent with   borderline left ventricular hypertrophy. Normal wall motion. Normal   systolic function with an approximate EF of 55 - 60%.    Right Ventricle: Right ventricle size is normal.    Left Atrium: Left atrium is mildly dilated.    Mitral Valve: Trivial regurgitation.   Stress test:   None recent  CV Imaging:   None recent   Cardiac catheterization:   [facility] 2023 [facility])    Mid RCA lesion is 20% stenosed.     Mid LAD-1 lesion is 20% stenosed.     Mid LAD-2 lesion is 30% stenosed. 1st Diag lesion is 50% stenosed.     Overall Impressions:   There was nonobstructive mild CAD.Aaron Aas   There was a patent stents in the RCA and LAD.   EP studies:   N/a   CV surgeries:   N/a   Peripheral vascular:   Prior L BKA   Anginal Equivalent: n/a     Past medical history:     has a past medical history of CAD (coronary artery disease), Diabetes mellitus, ESRD (end stage renal disease), and Hypertension.   has a past surgical history that includes pr transplantation of kidney (N/A, 11/08/2014) and pr transplant,prep cadaver renal graft (N/A, 11/08/2014).    Medications:   Reviewed and updated in Epic.    Allergies:  Reviewed and updated in Epic.    Social history:  ***   reports that he has never smoked. He has never used smokeless tobacco. He reports that he does not drink alcohol and does not use drugs.    Family history:  ***  family history is not on file.    Objective:  There were no vitals taken for this visit.   Wt Readings from Last 3 Encounters:   07/28/23 (!) 108.1 kg (238 lb 6.4 oz)   07/15/21 (!) 108 kg (238 lb)   05/09/19 (!) 106.8 kg (235 lb 6.4 oz)       There were no vitals filed for this visit.    General appearance - Normal, healthy, cooperative, in no acute distress, alert  Skin - No rashes or lesions.  Head - Normocephalic   Eyes - conjunctivae/corneas clear. No icterus.  Lungs -  Good diaphragmatic excursion. Lungs clear to auscultation.  Heart - Apex normal. Precordium quiet. RRR. Normal HS with no murmurs. No JVD.  Abdomen - Abdomen soft, non-tender. BS normal. No masses, organomegaly  Extremities -  Extremities normal. No deformities, edema, or skin discoloration  Peripheral pulses - normal, capilliary refill <2secs, strong peripheral pulses  Neuro - Gait normal        EKG personally reviewed in clinic.    Pertinent labs:  Lab Results   Component Value Date    Creatinine 1.59 (H) 08/12/2023    Creatinine 1.54 (H) 04/02/2019    Potassium 4.9 (H) 08/12/2023    Potassium 4.5 04/02/2019 Magnesium 1.4 (L) 07/28/2023    Magnesium 1.5 (L) 04/02/2019    LDL Calculated 92 11/07/2018    Cholesterol, LDL, Calculated 67 07/28/2023    HDL 42 11/07/2018    Cholesterol, HDL 45 07/28/2023    HB A1C, RAP/HGATE 6.8 (H) 01/13/2012    Hemoglobin A1c 8.4 (H) 11/07/2018    Hemoglobin A1C 9.5 (H) 07/28/2023    TSH 0.58 (L) 01/13/2012    INR 0.90 08/12/2023    INR 1.0 05/03/2013   .

## 2023-09-01 ENCOUNTER — Ambulatory Visit
Admit: 2023-09-01 | Payer: Medicare (Managed Care) | Attending: Student in an Organized Health Care Education/Training Program | Primary: Student in an Organized Health Care Education/Training Program

## 2023-09-01 ENCOUNTER — Ambulatory Visit: Admit: 2023-09-01 | Payer: Medicare (Managed Care) | Attending: Nephrology | Primary: Nephrology

## 2023-09-23 DIAGNOSIS — H401122 Primary open-angle glaucoma, left eye, moderate stage: Secondary | ICD-10-CM | POA: Diagnosis not present

## 2023-09-29 MED ORDER — OMEPRAZOLE 20 MG CAPSULE,DELAYED RELEASE
ORAL_CAPSULE | Freq: Every day | ORAL | 11 refills | 30.00000 days | Status: CP
Start: 2023-09-29 — End: 2024-09-28
  Filled 2023-10-04: qty 90, 90d supply, fill #0

## 2023-09-29 NOTE — Unmapped (Signed)
 Pt request for RX Refill

## 2023-09-29 NOTE — Unmapped (Signed)
 [facility] and Home Delivery Pharmacy Refill Coordination Note    Specialty Medication(s) to be Shipped:   Transplant: mycophenolate mofetil 180mg  and tacrolimus 1mg     Other medication(s) to be shipped: metoprolol and omeprazole     Clearance Benjamin Maldonado, DOB: June 28, 1975  Phone: [phone] (home)       All above HIPAA information was verified with patient.     Was a Nurse, learning disability used for this call? No    Completed refill call assessment today to schedule patient's medication shipment from the [facility] and Home Delivery Pharmacy  [phone]).  All relevant notes have been reviewed.     Specialty medication(s) and dose(s) confirmed: Regimen is correct and unchanged.   Changes to medications: Teofil reports no changes at this time.  Changes to insurance: No  New side effects reported not previously addressed with a pharmacist or physician: None reported  Questions for the pharmacist: No    Confirmed patient received a Conservation officer, historic buildings and a Surveyor, mining with first shipment. The patient will receive a drug information handout for each medication shipped and additional FDA Medication Guides as required.       DISEASE/MEDICATION-SPECIFIC INFORMATION        N/A    SPECIALTY MEDICATION ADHERENCE     Medication Adherence    Patient reported X missed doses in the last month: 0  Specialty Medication: mycophenolate 180 MG EC tablet (MYFORTIC)  Patient is on additional specialty medications: Yes  Additional Specialty Medications: tacrolimus 1 MG capsule (PROGRAF)  Patient Reported Additional Medication X Missed Doses in the Last Month: 0  Patient is on more than two specialty medications: No  Any gaps in refill history greater than 2 weeks in the last 3 months: no  Demonstrates understanding of importance of adherence: yes  Informant: patient  Adherence tools used: patient uses a pill box to manage medications  Support network for adherence: family member  Confirmed plan for next specialty medication refill: delivery by pharmacy  Refills needed for supportive medications: not needed          Refill Coordination    Has the Patients' Contact Information Changed: No  Is the Shipping Address Different: No         Were doses missed due to medication being on hold? No    tacrolimus 1  mg: 7 days of medicine on hand   mycophenolate 180 mg: 7 days of medicine on hand       REFERRAL TO PHARMACIST     Referral to the pharmacist: Not needed      [facility]     Shipping address confirmed in Epic.     Cost and Payment: Patient has a $0 copay, payment information is not required.    Delivery Scheduled: Yes, Expected medication delivery date: 10/05/23.     Medication will be delivered via UPS to the prescription address in Epic WAM.    Loretta Romp   [facility] and Home Delivery Pharm[facility]  Specialty Technician

## 2023-10-04 MED FILL — MYCOPHENOLATE SODIUM 180 MG TABLET,DELAYED RELEASE: ORAL | 90 days supply | Qty: 540 | Fill #3

## 2023-10-04 MED FILL — TACROLIMUS 1 MG CAPSULE, IMMEDIATE-RELEASE: ORAL | 90 days supply | Fill #5

## 2023-10-04 MED FILL — METOPROLOL TARTRATE 50 MG TABLET: ORAL | 90 days supply | Qty: 270 | Fill #1

## 2023-10-11 ENCOUNTER — Ambulatory Visit: Payer: Self-pay | Admitting: Nurse Practitioner

## 2023-10-18 DIAGNOSIS — G4733 Obstructive sleep apnea (adult) (pediatric): Secondary | ICD-10-CM | POA: Diagnosis not present

## 2023-10-27 NOTE — Unmapped (Unsigned)
 Endocrinology Clinic Visit Note    ASSESSMENT AND PLAN:     Benjamin Maldonado is a 48 y.o. 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    Type 2 diabetes mellitus, uncontrolled, complicated by nephropathy(ESRD s/p DDRT, albuminuria), retinopathy, CAD/CVA, amputations, with long-term current use of insulin    Lab Results   Component Value Date    A1C 9.5 (H) 07/28/2023    A1C 7.5 (H) 05/09/2019    A1C 8.4 (H) 11/07/2018    A1C 7.0 (H) 09/23/2017    A1C 7.5 (H) 03/23/2017     Goal A1c < 7% without hypoglycemia  Current medications: ***  Wt Readings from Last 5 Encounters:   07/28/23 (!) 108.1 kg (238 lb 6.4 oz)   07/15/21 (!) 108 kg (238 lb)   05/09/19 (!) 106.8 kg (235 lb 6.4 oz)   05/02/18 (!) 101.3 kg (223 lb 6.4 oz)   09/23/17 94.2 kg (207 lb 11.2 oz)   No history of cortisol testing. Thyroid function testing as below.   Given age of diagnosis, uncontrolled nature of DM with multiple severe complications, testing for GAD65, islet cell antibodies, C-peptide today. If C-peptide low, could limit therapies moving forward. ***  PLAN  ***  Discussed hypoglycemia plan and treatment, provided in AVS  Patient prescribed glucagon 02/2023 by [facility]. Will refill ***      Diabetes-Related Health Maintenance  Hypertension: goal <130/80, today ***; ***  Hyperlipidemia: LDL 67 07/2023, at goal.   Retinopathy: Yes  Ophthalmologist/optometrist: ***  Last visit: ***  Neuropathy: Last foot exam ***  Nephropathy: Alb/Cr ratio 185.3 mcg/mg 07/2023; DDRT 2016. Up-to-date. Meds: olmesartan , empagliflozin   MASH:   FIB-4 Calculation: 1.06 at 08/12/2023  2:26 PM  Calculated from:  SGOT/AST: 15 U/L at 07/28/2023  1:40 PM  SGPT/ALT: 17 U/L at 07/28/2023  1:40 PM  Platelets: 162 10*9/L at 08/12/2023  2:26 PM  Age: 26 years  Autoimmune workup:   TSH: 0.58 12/2011  Celiac: no sx  Other autoimmune diseases: n/a    Abnormal results of thyroid function studies   Latest Reference Range & Units 04/30/11 09:08 05/01/11 04:57 08/17/11 10:26 01/13/12 11:35   TSH 0.60 - 3.30 MICROIU/ML 0.24 (L)  0.38 (L) 0.58 (L)   T3, Total 1.0 - 1.7 NG/ML    1.1   Free T4 0.71 - 1.40 NG/DL  8.74 8.69 8.63   (L): Data is abnormally low    Obesity, class II  If DM continues to be uncontrolled despite medication adherence and escalation, could consider evaluation of hypercortisolism. ***      There are no Patient Instructions on file for this visit.    No follow-ups on file.    Patient was staffed with Dr. FERNAND    Georgia Jury, MD   PGY4 Fellow in Endocrinology and Metabolism  [employer]       SUBJECTIVE:     History of Present Illness:  Benjamin Maldonado is a 49 y.o. male who is seen in consultation today at the request of Randal Bennet Romans for evaluation of diabetes.    Exercise:      Diet:      Blood sugar monitoring:      Type 2 diabetes mellitus history  Dx: Age 72  Previously followed: [facility]  Past treatments: Lantus , Novolog , Ozempic,   Hypoglycemia unawareness? ***  DKA? {yes/no/denies:44648}  Family history:  DM complications assessment:  Retinopathy: yes  Nephropathy: yes  Peripheral neuropathy: {yes/no/denies:44648}  Autonomic neuropathy: {yes/no/denies:44648}  Gastropathy / Delayed gastric emptying: {yes/no/denies:44648}  Macrovascular complications CAD / CVA / PVD: yes  Amputations: yes, R BKA    Medical History Surgical History   Past Medical History[1]   Past Surgical History[2]       Social History Family History   Social History     Tobacco Use    Smoking status: Never    Smokeless tobacco: Never   Substance Use Topics    Alcohol use: No     Alcohol/week: 0.0 standard drinks of alcohol     Comment: denies use since 2008      Family History[3]       Medications   Current Medications[4]         Allergies   Allergies[5] OBJECTIVE:     Physical Exam:  There were no vitals taken for this visit.    General: obese male in no apparent distress  HEENT: EOMI, sclera anicteric, no thyromegaly***, no exophthalmos, no cushingoid features  Respiratory: No increased work of breathing on RA  MSK: Foot exam: normal monofilament testing at 8 out of 8 sites bilaterally, no wounds or lesions, normal dorsalis pedis pulses***  Neuro: Awake, alert, and oriented; no tremors***  Psych: Normal mood and affect  Skin: No abnormal skin pigmentation    Labs:  I personally reviewed labs available in Epic prior to the start of today's visit.      Lab Results   Component Value Date    A1C 9.5 (H) 07/28/2023    A1C 7.5 (H) 05/09/2019    A1C 8.4 (H) 11/07/2018    A1C 7.0 (H) 09/23/2017    A1C 7.5 (H) 03/23/2017     Lab Results   Component Value Date    TRIG 118 07/28/2023    CHOL 136 07/28/2023    HDL 45 07/28/2023    LDL 67 07/28/2023     Lab Results   Component Value Date    TSH 0.58 (L) 01/13/2012    FREET4 1.36 01/13/2012     Lab Results   Component Value Date    CREATININE 1.59 (H) 08/12/2023    ALBCRERAT 524.6 (H) 07/28/2023     No results found for: [facility]  Lab Results   Component Value Date    CPEPTIDE 5.5 (H) 06/24/2009     Hospital Outpatient Visit on 08/12/2023   Component Date Value    Antibody Screen 08/12/2023 NEG     ABO Grouping 08/12/2023 O POS     WBC 08/12/2023 8.7     RBC 08/12/2023 5.22     HGB 08/12/2023 14.2     HCT 08/12/2023 44.3     MCV 08/12/2023 84.8     MCH 08/12/2023 27.2     MCHC 08/12/2023 32.1     RDW 08/12/2023 14.8     MPV 08/12/2023 10.2     Platelet 08/12/2023 183     Sodium 08/12/2023 139     Potassium 08/12/2023 4.9 (H)     Chloride 08/12/2023 104     CO2 08/12/2023 23.0     Anion Gap 08/12/2023 12     BUN 08/12/2023 32 (H)     Creatinine 08/12/2023 1.59 (H)     BUN/Creatinine Ratio 08/12/2023 20     eGFR CKD-EPI (2021) Male 08/12/2023 54 (L)  Glucose 08/12/2023 216 (H)     Calcium 08/12/2023 9.9     PT 08/12/2023 10.3     INR 08/12/2023 0.90     APTT 08/12/2023 35.3     Heparin Correlation 08/12/2023 0.2     Diagnosis 08/12/2023                      Value:Kidney, allograft, needle biopsy (Parts A, B and C):   - 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      Diagnosis Comment 08/12/2023                      Value:The diagnoses and differential diagnoses were communicated (Epic secure chat) with Dr. THEOTIS. Detwiler on 08/18/2023. There is no evidence of acute interstitial (cellular) rejection and no evidence of humoral (antibody mediated) rejection. The component of focal segmental glomerular sclerosis is most likely secondary to diabetic glomerulosclerosis, IgA nephropathy, chronic vascular disease or a combination of these etiologies. 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 suspect functional calcineurin inhibitory toxicity who presents with new onset proteinuria (UPC 0.96, negative DSAs, serum creatinine 1.41, BUN 23, eGFR 62, glucose 178, HgbA1c 9.5, albumin 3.8, cholesterol 136, negative BK / CMV / EBV / HSV / HBV / HCV).      Gross Description 08/12/2023                      Value:Received is one specimen container with saline soaked telfa pad, labeled with the patient's name Massimiliano Levar Honeycutt, DOB, and MRN.  It contains one core of tissue (2.0 x 0.1 cm) which are triaged and divided as follows:  Specimen A has one fragment of tissue (1.4 x 0.1 cm ) submitted in formalin for standard light microscopy (block A1).  Specimen B has one piece of tissue (0.5 x 0.1 cm) entirely snap frozen in OCT for immunofluorescence microscopy (block B1).  Specimen C has one piece of tissue (0.1 x 0.1 cm) submitted in formalin for electron microscopy (block C1).      Microscopic Description 08/12/2023                      Value:Light Microscopy (Part A): Tissue for light microscopy is evaluated at 7 levels of section with H&E, PAS, periodic acid methenamine silver and trichrome stains. Glomeruli: In addition to 2 globally sclerotic glomeruli, the up to 19 additional glomeruli per level of section have thickened glomerular capillary walls, PAS positive segmental mesangial matrix expansion with mild mesangial hypercellularity. No glomerular necrosis and no crescents are identified (g0, cg0, mm3). Tubules and interstitium: There is slight (<5%; ci0) interstitial fibrosis with  slight associated tubular atrophy (ct0) and and no significant interstitial inflammation is present (ti0, i-IFTA0, t-IFTA0, i0, t0, ptc0). Vessels: There is mild arteriosclerosis (cv1) and mild hyaline arteriolosclerosis (ah1, aah0). No vascular inflammatory lesions are present (v0). Controls: Masson trichrome, periodic acid methenamine silver and PAS stains were necessary for evaluation of this biopsy and showed expected staining patterns                           with internal control tissue matrix.    Direct Immunofluorescence (Part B): H&E and Toluidine blue stained sections of frozen tissue have mild arteriosclerosis and up to 5 glomeruli per level of section including one glomerulus with segmental sclerosis. The latter glomerulus has segmental coarse granular capillary tuft reactivity with antisera specific for IgM, C3 and C1q consistent with trapping in segmental sclerosis. The glomeruli have granular mesangial staining with antisera specific for IgA (2+), IgM (1+), kappa light chains (1+) and lambda light chains (1+). The glomeruli have no staining with antiserum specific for IgG. The complement degradation product C4d is negative and not detected along peritubular capillaries. Internal control stains appropriately. No significant extraglomerular staining is present. Weak tubulointerstitial fibrinogen usual reactivity is present. All polyclonal antibodies used for immunofluorescence staining have been previously tested and shown to have                           appropriate reactivity with positive control specimens.     Electron Microscopy (Part C): Toluidine Blue stained thick section (slide C1-1) has up to 2 glomeruli with segmental mesangial matrix expansion. The glomeruli (slide C1-1) examined ultrastructurally have global thickening of the glomerular capillary walls, segmental mesangial matrix expansion with mesangial hypercellularity, mesangial matrix immune complex type electron dense deposits and no glomerular capillary wall immune complex type electron dense deposits are identified. Segmental visceral epithelial foot process effacement (30%) and segmental microvillous transformation are present. No endothelial cell luminal cytoplasmic extensions are identified and endothelial fenestrations are present. No endothelial tubuloreticular inclusions are identified. Peritubular capillaries are thin walled.       Disclaimer 08/12/2023                      Value:Unless otherwise specified, specimens are preserved using 10% neutral buffered formalin. For cases in which immunohistochemical and/or in-situ hybridization stains are performed, the following statement applies: Appropriate controls for each stain (positive controls with or without negative controls) have been evaluated and stain as expected. These stains have not been separately validated for use on decalcified specimens and should be interpreted with caution in that setting. Some of the reagents used for these stains may be classified as analyte specific reagents (ASR). Tests using ASRs were developed, and their performance characteristics were determined, by the Anatomic Pathology Department [facility]). They have not been cleared or approved by the US  Food and Drug Administration (FDA). The FDA does not require these tests to go through premarket FDA review. These tests are used for clinical purposes. They should not be regarded as investigational or for                           research. This laboratory is certified under the Clinical Laboratory Improvement Amendments (CLIA) as qualified to perform high complexity clinical laboratory testing.      WBC 08/12/2023 7.4     RBC 08/12/2023 4.88  HGB 08/12/2023 13.1     HCT 08/12/2023 41.3     MCV 08/12/2023 84.6     MCH 08/12/2023 26.8     MCHC 08/12/2023 31.6 (L)     RDW 08/12/2023 14.8     MPV 08/12/2023 10.1     Platelet 08/12/2023 162    Appointment on 07/28/2023   Component Date Value    Magnesium  07/28/2023 1.4 (L)     Phosphorus 07/28/2023 2.4     Sodium 07/28/2023 141     Potassium 07/28/2023 4.7     Chloride 07/28/2023 102     CO2 07/28/2023 27.8     Anion Gap 07/28/2023 11     BUN 07/28/2023 23     Creatinine 07/28/2023 1.41 (H)     BUN/Creatinine Ratio 07/28/2023 16     eGFR CKD-EPI (2021) Male 07/28/2023 62     Glucose 07/28/2023 178 (H)     Calcium 07/28/2023 10.1     Tacrolimus , Trough 07/28/2023 8.3     CMV Viral Ld 07/28/2023 Not Detected     BK Blood Result 07/28/2023 Not Detected     Triglycerides 07/28/2023 118     Cholesterol, Total 07/28/2023 136     Cholesterol, HDL 07/28/2023 45     Cholesterol, LDL, Calcul* 07/28/2023 67     VLDL Cholesterol Cal 07/28/2023 23.6     Chol/HDL Ratio 07/28/2023 3.0     Cholesterol, Non-HDL, Ca* 07/28/2023 91 FASTING 07/28/2023 Yes     Albumin 07/28/2023 3.8     Total Protein 07/28/2023 7.0     Total Bilirubin 07/28/2023 0.7     Bilirubin, Direct 07/28/2023 0.20     AST 07/28/2023 15     ALT 07/28/2023 17     Alkaline Phosphatase 07/28/2023 99     Hemoglobin A1C 07/28/2023 9.5 (H)     Estimated Average Glucose 07/28/2023 226     PTH 07/28/2023 228.2 (H)     Vitamin D Total (25OH) 07/28/2023 8.3 (L)     Donor ID 07/28/2023 [id number]     Donor HLA-A Antigen #1 07/28/2023 A2     Anti-Donor HLA-A #1 MFI 07/28/2023 0     Donor HLA-A Antigen #2 07/28/2023 A24     Anti-Donor HLA-A #2 MFI 07/28/2023 0     Donor HLA-B Antigen #1 07/28/2023 B7     Anti-Donor HLA-B #1 MFI 07/28/2023 0     Donor HLA-B Antigen #2 07/28/2023 B35     Anti-Donor HLA-B #2 MFI 07/28/2023 0     Donor HLA-C Antigen #1 07/28/2023 C4     Anti-Donor HLA-C #1 MFI 07/28/2023 0     Donor HLA-C Antigen #2 07/28/2023 C7     Anti-Donor HLA-C #2 MFI 07/28/2023 0     Donor HLA-DR Antigen #1 07/28/2023 DR4     Anti-Donor HLA-DR #1 MFI 07/28/2023 0     Donor HLA-DR Antigen #2 07/28/2023 DR8     Anti-Donor HLA-DR #2 MFI 07/28/2023 0     Donor DRw Antigen #1 07/28/2023 DR53     Anti-Donor DRw #1 MFI 07/28/2023 0     Donor HLA-DQB Antigen #1 07/28/2023 DQ8     Anti-Donor HLA-DQB #1 MFI 07/28/2023 174     Donor HLA-DQB Antigen #2 07/28/2023 DQ4     Anti-Donor HLA-DQB #2 MFI 07/28/2023 0     Donor HLA-DP Antigen #1 07/28/2023 [id number]:02     Anti-Donor HLA-DP Ag #1 * 07/28/2023 0     DSA Comment 07/28/2023  HLA Class 1 Antibody Res* 07/28/2023 Negative     HLA Class 1 Antibody Com* 07/28/2023      HLA Class 2 Antibody Res* 07/28/2023 Negative     HLA Class 2 Antibody Com* 07/28/2023      WBC 07/28/2023 9.5     RBC 07/28/2023 5.38     HGB 07/28/2023 14.6     HCT 07/28/2023 45.4     MCV 07/28/2023 84.3     MCH 07/28/2023 27.0     MCHC 07/28/2023 32.1     RDW 07/28/2023 14.8     MPV 07/28/2023 9.7     Platelet 07/28/2023 173     Neutrophils % 07/28/2023 76.4     Lymphocytes % 07/28/2023 15.4     Monocytes % 07/28/2023 7.6     Eosinophils % 07/28/2023 0.4     Basophils % 07/28/2023 0.2     Absolute Neutrophils 07/28/2023 7.3     Absolute Lymphocytes 07/28/2023 1.5     Absolute Monocytes 07/28/2023 0.7     Absolute Eosinophils 07/28/2023 0.0     Absolute Basophils 07/28/2023 0.0     Urine Culture, Comprehen* 07/28/2023 NO GROWTH     Creat U 07/28/2023 353.2     Protein, Ur 07/28/2023 319.9     Protein/Creatinine Ratio* 07/28/2023 0.906     Color, UA 07/28/2023 Yellow     Clarity, UA 07/28/2023 Clear     Specific Gravity, UA 07/28/2023 1.030     pH, UA 07/28/2023 6.0     Leukocyte Esterase, UA 07/28/2023 Negative     Nitrite, UA 07/28/2023 Negative     Protein, UA 07/28/2023 300 mg/dL (A)     Glucose, UA 95/89/7974 Trace (A)     Ketones, UA 07/28/2023 Negative     Urobilinogen, UA 07/28/2023 <2.0 mg/dL     Bilirubin, UA 95/89/7974 Negative     Blood, UA 07/28/2023 Small (A)     RBC, UA 07/28/2023 7 (H)     WBC, UA 07/28/2023 1     Squam Epithel, UA 07/28/2023 <1     Bacteria, UA 07/28/2023 Occasional (A)     Hyaline Casts, UA 07/28/2023 1     Sperm, UA 07/28/2023 Rare (A)     Mucus, UA 07/28/2023 Occasional (A)     Creat U 07/28/2023 353.2     Albumin Quantitative, Ur* 07/28/2023 185.3     Albumin/Creatinine Ratio 07/28/2023 524.6 (H)      Lab Results   Component Value Date    NA 139 08/12/2023    K 4.9 (H) 08/12/2023    CL 104 08/12/2023    CO2 23.0 08/12/2023    BUN 32 (H) 08/12/2023    CREATININE 1.59 (H) 08/12/2023    GFR 5.88 (L) 02/22/2012    AST 15 07/28/2023    PROT 7.0 07/28/2023    ALBUMIN 3.8 07/28/2023     No results found for: FSH, LH  No results found for: ESTRADIOL  No results found for: TESTOSTERONE  No results found for: PROLACTIN  No components found for: ILGF1  No results found for: CORTISOL  No components found for: ALDOSTERON  Lab Results   Component Value Date    PTH 228.2 (H) 07/28/2023    PTH 129.5 (H) 07/15/2021    PTH 64 11/07/2018     No results found for: CA  Lab Results   Component Value Date    ALBUMIN 3.8 07/28/2023    ALBUMIN 4.4 07/15/2021    ALBUMIN 4.4 05/02/2018  Lab Results   Component Value Date    PHOS 2.4 07/28/2023    PHOS 3.4 07/15/2021    PHOS 2.9 05/09/2019     No components found for: VITD26    Imaging:  I personally reviewed imaging available in Epic prior to the start of today's visit.    I reviewed and summarized (above) records in preparation for today's visit all pertinent notes in Epic/Media and CareEverywhere as well as any sent records.           [1]   Past Medical History:  Diagnosis Date    CAD (coronary artery disease)     Diabetes mellitus        ESRD (end stage renal disease)        Hypertension    [2]   Past Surgical History:  Procedure Laterality Date    PR TRANSPLANT,PREP CADAVER RENAL GRAFT N/A 11/08/2014    Pro[facility] DONR RENAL ALLOGFT PRIOR TO TRNSPLNT, INCL DISSEC/REM PERINEPH FAT, DIAPH/RTPER ATTAC;  Surgeon: Lamar DELENA Portugal, MD;  Location: MAIN OR [place];  Service: Transplant    PR TRANSPLANTATION OF KIDNEY N/A 11/08/2014    Procedure: RENAL ALLOTRANSPLANTATION, IMPLANTATION OF GRAFT; WITHOUT RECIPIENT NEPHRECTOMY;  Surgeon: Lamar DELENA Portugal, MD;  Location: [facility];  Service: Transplant   [3] No family history on file.  [4]   Current Outpatient Medications:     acetaminophen (TYLENOL) 325 MG tablet, Take 2 tablets (650 mg total) by mouth every six (6) hours as needed for pain., Disp: 100 tablet, Rfl: 2    aspirin (ADULT LOW DOSE ASPIRIN) 81 MG tablet, Take 1 tablet (81 mg total) by mouth daily., Disp: 30 tablet, Rfl: 11    atorvastatin  (LIPITOR) 20 MG tablet, Take 1 tablet (20 mg total) by mouth daily., Disp: 90 tablet, Rfl: 3    blood sugar diagnostic Strp, by Other route Four (4) times a day (before meals and nightly). One Touch, Disp: 100 strip, Rfl: 5    blood-glucose meter (GLUCOSE MONITORING KIT) kit, Use as instructed, Disp: 1 each, Rfl: 0    blood-glucose sensor Devi, Wear dexcom sensor for blood glucose monitoring. Change sensor every 10 days, Disp: 3 each, Rfl: 3    dorzolamide -timolol  (COSOPT ) 22.3-6.8 mg/mL ophthalmic solution, Administer 1 drop into the left eye Two (2) times a day., Disp: 10 mL, Rfl: 12    empagliflozin  (JARDIANCE ) 10 mg tablet, Take 1 tablet (10 mg total) by mouth daily., Disp: 90 tablet, Rfl: 3    GLUC.METER,DIS.P-LOADED STRIPS (GLUCOSE METER, DISP & STRIPS MISC), Frequency:PHARMDIR   Dosage:0.0     Instructions:  Note:250.00, life long Dose: 1, Disp: , Rfl:     insulin  aspart (NOVOLOG  FLEXPEN U-100 INSULIN ) 100 unit/mL (3 mL) injection pen, Inject 4 units under skin with breakfast, 8 units with lunch, and 6 units with supper and sliding scale. Max units 54 per day., Disp: 30 mL, Rfl: 11    insulin  glargine (LANTUS  SOLOSTAR U-100 INSULIN ) 100 unit/mL (3 mL) injection pen, Inject 0.16 mL (16 Units total) under the skin nightly., Disp: 15 mL, Rfl: 5    magnesium  oxide (MAG-OX) 400 mg (241.3 mg elemental magnesium ) tablet, Take 1 tablet (400 mg total) by mouth two (2) times a day., Disp: 180 tablet, Rfl: 3    metoPROLOL  tartrate (LOPRESSOR ) 50 MG tablet, Take 1 and 1/2 tablets (75 mg total) by mouth Two (2) times a day., Disp: 90 tablet, Rfl: 11    mycophenolate  (MYFORTIC ) 180  MG EC tablet, Take 3 tablets (540 mg total) by mouth two (2) times a day., Disp: 180 tablet, Rfl: 11    olmesartan  (BENICAR ) 5 MG tablet, Take 2 tablets (10 mg total) by mouth daily., Disp: 200 tablet, Rfl: 3    omeprazole  (PRILOSEC) 20 MG capsule, Take 1 capsule (20 mg total) by mouth daily., Disp: 30 capsule, Rfl: 11    pen needle, diabetic (BD ULTRA-FINE NANO PEN NEEDLE) 32 gauge x 5/32 (4 mm) Ndle, Use to administer insulin  4 times daily., Disp: 100 each, Rfl: PRN    tacrolimus  (PROGRAF ) 1 MG capsule, Take 6 capsules (6 mg total) by mouth two (2) times a day., Disp: 360 capsule, Rfl: 11  [5]   Allergies  Allergen Reactions    Iodine Other (See Comments)    Iodine And Iodide Containing Products     Shellfish Containing Products Other (See Comments)

## 2023-11-08 NOTE — Unmapped (Signed)
 [facility] Delivery Pharmacy Clinical Assessment & Refill Coordination Note    Benjamin Maldonado, DOB: [birth year]  Phone: [phone] (home)     All above HIPAA information was verified with patient.     Was a Nurse, learning disability used for this call? No    Specialty Medication(s):   Transplant: mycophenolic acid 180mg  and tacrolimus  1mg      Current Medications[1]     Changes to medications: Benjamin Maldonado reports no changes at this time.    Medication list has been reviewed and updated in Epic: Yes    Allergies[2]    Changes to allergies: No    Allergies have been reviewed and updated in Epic: Yes    SPECIALTY MEDICATION ADHERENCE     Mycophenolate  180 mg: 50 days of medicine on hand   Tacrolimus  1 mg: 50 days of medicine on hand       Medication Adherence    Patient reported X missed doses in the last month: 1  Specialty Medication: Mycophenolate  250mg   Patient is on additional specialty medications: Yes  Additional Specialty Medications: Tacrolimus  1mg   Patient Reported Additional Medication X Missed Doses in the Last Month: 1  Patient is on more than two specialty medications: No  Adherence tools used: patient uses a pill box to manage medications  Support network for adherence: family member          Specialty medication(s) dose(s) confirmed: Regimen is correct and unchanged.     Are there any concerns with adherence? No    Adherence counseling provided? Not needed    CLINICAL MANAGEMENT AND INTERVENTION      Clinical Benefit Assessment:    Do you feel the medicine is effective or helping your condition? Yes    Clinical Benefit counseling provided? Not needed    Adverse Effects Assessment:    Are you experiencing any side effects? No    Are you experiencing difficulty administering your medicine? No    Quality of Life Assessment:    Quality of Life    Rheumatology  Oncology  Dermatology  Cystic Fibrosis          How many days over the past month did your kidney transplant  keep you from your normal activities? For example, brushing your teeth or getting up in the morning. 0    Have you discussed this with your provider? Not needed    Acute Infection Status:    Acute infections noted within Epic:  No active infections    Patient reported infection: None    Therapy Appropriateness:    Is therapy appropriate based on current medication list, adverse reactions, adherence, clinical benefit and progress toward achieving therapeutic goals? Yes, therapy is appropriate and should be continued     Clinical Intervention:    Was an intervention completed as part of this clinical assessment? No    DISEASE/MEDICATION-SPECIFIC INFORMATION      N/A    Solid Organ Transplant: Not Applicable    PATIENT SPECIFIC NEEDS     Does the patient have any physical, cognitive, or cultural barriers? No    Is the patient high risk? Yes, patient is taking a REMS drug. Medication is dispensed in compliance with REMS program    Does the patient require physician intervention or other additional services (i.e., nutrition, smoking cessation, social work)? No    Does the patient have an additional or emergency contact listed in their chart? Yes    SOCIAL DETERMINANTS OF HEALTH     At  the [facility], we have learned that life circumstances - like trouble affording food, housing, utilities, or transportation can affect the health of many of our patients.   That is why we wanted to ask: are you currently experiencing any life circumstances that are negatively impacting your health and/or quality of life? Patient declined to answer    Social Drivers of Health     Food Insecurity: Not on file   Tobacco Use: Low Risk  (08/24/2023)    Received from [facility]    Patient History     Smoking Tobacco Use: Never     Smokeless Tobacco Use: Never     Passive Exposure: Not on file   Transportation Needs: Not on file   Alcohol Use: Not At Risk (12/18/2018)    Received from [facility] visits prior to 06/19/2022.    AUDIT-C     Frequency of Alcohol Consumption: Never     Average Number of Drinks: Not on file     Frequency of Binge Drinking: Not on file   Housing: Not on file   Physical Activity: Not on file   Utilities: Not on file   Stress: Not on file   Interpersonal Safety: Not on file   Substance Use: Not on file (02/23/2023)   Intimate Partner Violence: Unknown (07/24/2021)    Received from [facility]    HITS     Physically Hurt: Not on file     Insult or Talk Down To: Not on file     Threaten Physical Harm: Not on file     Scream or Curse: Not on file   Social Connections: Unknown (09/01/2021)    Received from [facility]    Social Network     Social Network: Not on file   Financial Resource Strain: Not on file   Health Literacy: Not on file   Internet Connectivity: Not on file       Would you be willing to receive help with any of the needs that you have identified today? Not applicable     Benjamin Maldonado has been contacted in regards to their refill of Mycophenolate  and Tacrolimus . At this time, they have declined refill due to patient having 50 doses remaining. Refill assessment call date has been updated per the patient's request.      SHIPPING     Specialty Medication(s) to be Shipped:   Transplant: none at this time    Other medication(s) to be shipped: No additional medications requested for fill at this time         The patient will receive a drug information handout for each medication shipped and additional FDA Medication Guides as required.  Verified that patient has previously received a Conservation officer, historic buildings and a Surveyor, mining.    The patient or caregiver noted above participated in the development of this care plan and knows that they can request review of or adjustments to the care plan at any time.      All of the patient's questions and concerns have been addressed.    Harlene Gal, PharmD   [facility] Specialty Pharmacist       [1]   Current Outpatient Medications   Medication Sig Dispense Refill    acetaminophen (TYLENOL) 325 MG tablet Take 2 tablets (650 mg total) by mouth every six (6) hours as needed for pain. 100 tablet 2    aspirin (ADULT LOW DOSE ASPIRIN)  81 MG tablet Take 1 tablet (81 mg total) by mouth daily. 30 tablet 11    atorvastatin  (LIPITOR) 20 MG tablet Take 1 tablet (20 mg total) by mouth daily. 90 tablet 3    blood sugar diagnostic Strp by Other route Four (4) times a day (before meals and nightly). One Touch 100 strip 5    blood-glucose meter (GLUCOSE MONITORING KIT) kit Use as instructed 1 each 0    blood-glucose sensor Devi Wear dexcom sensor for blood glucose monitoring. Change sensor every 10 days 3 each 3    dorzolamide -timolol  (COSOPT ) 22.3-6.8 mg/mL ophthalmic solution Administer 1 drop into the left eye Two (2) times a day. 10 mL 12    empagliflozin  (JARDIANCE ) 10 mg tablet Take 1 tablet (10 mg total) by mouth daily. 90 tablet 3    GLUC.METER,DIS.P-LOADED STRIPS (GLUCOSE METER, DISP & STRIPS MISC) Frequency:PHARMDIR   Dosage:0.0     Instructions:  Note:250.00, life long Dose: 1      insulin  aspart (NOVOLOG  FLEXPEN U-100 INSULIN ) 100 unit/mL (3 mL) injection pen Inject 4 units under skin with breakfast, 8 units with lunch, and 6 units with supper and sliding scale. Max units 54 per day. 30 mL 11    insulin  glargine (LANTUS  SOLOSTAR U-100 INSULIN ) 100 unit/mL (3 mL) injection pen Inject 0.16 mL (16 Units total) under the skin nightly. 15 mL 5    magnesium  oxide (MAG-OX) 400 mg (241.3 mg elemental magnesium ) tablet Take 1 tablet (400 mg total) by mouth two (2) times a day. 180 tablet 3    metoPROLOL  tartrate (LOPRESSOR ) 50 MG tablet Take 1 and 1/2 tablets (75 mg total) by mouth Two (2) times a day. 90 tablet 11    mycophenolate  (MYFORTIC ) 180 MG EC tablet Take 3 tablets (540 mg total) by mouth two (2) times a day. 180 tablet 11    olmesartan  (BENICAR ) 5 MG tablet Take 2 tablets (10 mg total) by mouth daily. 200 tablet 3    omeprazole  (PRILOSEC) 20 MG capsule Take 1 capsule (20 mg total) by mouth daily. 30 capsule 11    pen needle, diabetic (BD ULTRA-FINE NANO PEN NEEDLE) 32 gauge x 5/32 (4 mm) Ndle Use to administer insulin  4 times daily. 100 each PRN    tacrolimus  (PROGRAF ) 1 MG capsule Take 6 capsules (6 mg total) by mouth two (2) times a day. 360 capsule 11     No current facility-administered medications for this visit.   [2]   Allergies  Allergen Reactions    Iodine Other (See Comments)    Iodine And Iodide Containing Products     Shellfish Containing Products Other (See Comments)

## 2023-11-14 NOTE — Unmapped (Signed)
 Update trf

## 2023-11-17 ENCOUNTER — Other Ambulatory Visit: Payer: Self-pay | Admitting: "Endocrinology

## 2023-11-18 DIAGNOSIS — H401122 Primary open-angle glaucoma, left eye, moderate stage: Secondary | ICD-10-CM | POA: Diagnosis not present

## 2023-11-18 DIAGNOSIS — Z961 Presence of intraocular lens: Secondary | ICD-10-CM | POA: Diagnosis not present

## 2023-11-19 DIAGNOSIS — G4733 Obstructive sleep apnea (adult) (pediatric): Secondary | ICD-10-CM | POA: Diagnosis not present

## 2023-11-24 ENCOUNTER — Ambulatory Visit: Admitting: Podiatry

## 2023-11-29 ENCOUNTER — Ambulatory Visit (INDEPENDENT_AMBULATORY_CARE_PROVIDER_SITE_OTHER): Admitting: Podiatry

## 2023-11-29 ENCOUNTER — Encounter: Payer: Self-pay | Admitting: Podiatry

## 2023-11-29 DIAGNOSIS — E1169 Type 2 diabetes mellitus with other specified complication: Secondary | ICD-10-CM

## 2023-11-29 DIAGNOSIS — B351 Tinea unguium: Secondary | ICD-10-CM

## 2023-11-29 DIAGNOSIS — M79674 Pain in right toe(s): Secondary | ICD-10-CM | POA: Diagnosis not present

## 2023-11-29 DIAGNOSIS — Z794 Long term (current) use of insulin: Secondary | ICD-10-CM

## 2023-11-29 NOTE — Progress Notes (Signed)
 This patient returns to my office for at risk foot care.  This patient requires this care by a professional since this patient will be at risk due to having diabetes and kidney disease. He has amputation left leg  BK.  This patient is unable to cut nails himself since the patient cannot reach his nails.These nails are painful walking and wearing shoes.  This patient presents for at risk foot care today.  General Appearance  Alert, conversant and in no acute stress.  Vascular  Dorsalis pedis and posterior tibial  pulses are palpable  right foot.  Capillary return is within normal limits  right foot. Temperature is within normal limits    Neurologic  Senn-Weinstein monofilament wire test within normal limits  right  Muscle power within normal limits right   Nails Thick disfigured discolored nails with subungual debris  from hallux to fifth toes right  No evidence of bacterial infection or drainage bilaterally.  Orthopedic  No limitations of motion  feet .  No crepitus or effusions noted.  No bony pathology or digital deformities noted. BK amputation.  Skin  normotropic skin with no porokeratosis noted bilaterally.  No signs of infections or ulcers noted.     Onychomycosis  Pain in right toes  Pain in left toes  Consent was obtained for treatment procedures.   Mechanical debridement of nails 1-5  bilaterally performed with a nail nipper.  Filed with dremel without incident.    Return office visit    3 months                  Told patient to return for periodic foot care and evaluation due to potential at risk complications.   Cordella Bold DPM

## 2023-12-20 DIAGNOSIS — G4733 Obstructive sleep apnea (adult) (pediatric): Secondary | ICD-10-CM | POA: Diagnosis not present

## 2023-12-23 NOTE — Unmapped (Signed)
 [facility] Special[facility] and Home Delivery Pharmacy Refill Coordination Note    Specialty Medication(s) to be Shipped:   Transplant: mycophenolate  mofetil 180 mg and tacrolimus  1mg     Other medication(s) to be shipped: atorvastatin , lantus , metoprolol , novolog , omeprazole      Specialty Medications not needed at this time: N/A     Benjamin Maldonado, DOB: Sep 20, 1975  Phone: [phone] (home)       All above HIPAA information was verified with patient.     Was a Nurse, learning disability used for this call? No    Completed refill call assessment today to schedule patient's medication shipment from the [facility]  [phone]).  All relevant notes have been reviewed.     Specialty medication(s) and dose(s) confirmed: Regimen is correct and unchanged.   Changes to medications: Verdon reports no changes at this time.  Changes to insurance: No  New side effects reported not previously addressed with a pharmacist or physician: None reported  Questions for the pharmacist: No    Confirmed patient received a Conservation officer, historic buildings and a Surveyor, mining with first shipment. The patient will receive a drug information handout for each medication shipped and additional FDA Medication Guides as required.       DISEASE/MEDICATION-SPECIFIC INFORMATION        N/A    SPECIALTY MEDICATION ADHERENCE     Medication Adherence    Patient reported X missed doses in the last month: 1  Specialty Medication: tacrolimus  1 MG capsule (PROGRAF )  Patient is on additional specialty medications: Yes  Additional Specialty Medications: mycophenolate  180 MG EC tablet (MYFORTIC )  Patient Reported Additional Medication X Missed Doses in the Last Month: 1  Patient is on more than two specialty medications: No  Adherence tools used: patient uses a pill box to manage medications  Support network for adherence: family member              Were doses missed due to medication being on hold? No    mycophenolate  180 MG EC tablet (MYFORTIC ) 7 days of medicine on hand   tacrolimus  1 MG capsule (PROGRAF )  7 days of medicine on hand       REFERRAL TO PHARMACIST     Referral to the pharmacist: Not needed      [facility]     Shipping address confirmed in Epic.     Cost and Payment: Patient has a $0 copay, payment information is not required.    Delivery Scheduled: Yes, Expected medication delivery date: 12/27/2023.     Medication will be delivered via UPS to the prescription address in Epic WAM.    Nelida Winfred [facility]  Specialty Technician

## 2023-12-26 MED FILL — TACROLIMUS 1 MG CAPSULE, IMMEDIATE-RELEASE: ORAL | 90 days supply | Fill #6

## 2023-12-26 MED FILL — ATORVASTATIN 20 MG TABLET: ORAL | 90 days supply | Qty: 90 | Fill #1

## 2023-12-26 MED FILL — NOVOLOG FLEXPEN U-100 INSULIN ASPART 100 UNIT/ML (3 ML) SUBCUTANEOUS: SUBCUTANEOUS | 55 days supply | Qty: 30 | Fill #4

## 2023-12-26 MED FILL — OMEPRAZOLE 20 MG CAPSULE,DELAYED RELEASE: ORAL | 90 days supply | Qty: 90 | Fill #1

## 2023-12-26 MED FILL — METOPROLOL TARTRATE 50 MG TABLET: ORAL | 90 days supply | Qty: 270 | Fill #2

## 2023-12-26 MED FILL — LANTUS SOLOSTAR U-100 INSULIN 100 UNIT/ML (3 ML) SUBCUTANEOUS PEN: SUBCUTANEOUS | 93 days supply | Qty: 15 | Fill #2

## 2023-12-26 MED FILL — MYCOPHENOLATE SODIUM 180 MG TABLET,DELAYED RELEASE: ORAL | 90 days supply | Qty: 540 | Fill #4

## 2023-12-28 DIAGNOSIS — N186 End stage renal disease: Principal | ICD-10-CM

## 2023-12-28 DIAGNOSIS — Z940 Kidney transplant status: Principal | ICD-10-CM

## 2023-12-28 NOTE — Unmapped (Signed)
 [facility] orders.

## 2024-01-03 DIAGNOSIS — N186 End stage renal disease: Secondary | ICD-10-CM | POA: Diagnosis not present

## 2024-01-03 DIAGNOSIS — Z940 Kidney transplant status: Secondary | ICD-10-CM | POA: Diagnosis not present

## 2024-01-03 LAB — CBC W/ DIFFERENTIAL
BANDED NEUTROPHILS ABSOLUTE COUNT: 0 x10E3/uL (ref 0.0–0.1)
BASOPHILS ABSOLUTE COUNT: 0 x10E3/uL (ref 0.0–0.2)
BASOPHILS RELATIVE PERCENT: 0 %
EOSINOPHILS ABSOLUTE COUNT: 0 x10E3/uL (ref 0.0–0.4)
EOSINOPHILS RELATIVE PERCENT: 0 %
HEMATOCRIT: 49.8 % (ref 37.5–51.0)
HEMOGLOBIN: 15.1 g/dL (ref 13.0–17.7)
IMMATURE GRANULOCYTES: 0 %
LYMPHOCYTES ABSOLUTE COUNT: 1.2 x10E3/uL (ref 0.7–3.1)
LYMPHOCYTES RELATIVE PERCENT: 16 %
MEAN CORPUSCULAR HEMOGLOBIN CONC: 30.3 g/dL — ABNORMAL LOW (ref 31.5–35.7)
MEAN CORPUSCULAR HEMOGLOBIN: 26.8 pg (ref 26.6–33.0)
MEAN CORPUSCULAR VOLUME: 89 fL (ref 79–97)
MONOCYTES ABSOLUTE COUNT: 0.6 x10E3/uL (ref 0.1–0.9)
MONOCYTES RELATIVE PERCENT: 8 %
NEUTROPHILS ABSOLUTE COUNT: 5.7 x10E3/uL (ref 1.4–7.0)
NEUTROPHILS RELATIVE PERCENT: 76 %
PLATELET COUNT: 168 x10E3/uL (ref 150–450)
RED BLOOD CELL COUNT: 5.63 x10E6/uL (ref 4.14–5.80)
RED CELL DISTRIBUTION WIDTH: 13.4 % (ref 11.6–15.4)
WHITE BLOOD CELL COUNT: 7.6 x10E3/uL (ref 3.4–10.8)

## 2024-01-03 LAB — BASIC METABOLIC PANEL
BLOOD UREA NITROGEN: 25 mg/dL — ABNORMAL HIGH (ref 6–24)
BUN / CREAT RATIO: 16 (ref 9–20)
CALCIUM: 9.7 mg/dL (ref 8.7–10.2)
CHLORIDE: 106 mmol/L (ref 96–106)
CO2: 16 mmol/L — ABNORMAL LOW (ref 20–29)
CREATININE: 1.53 mg/dL — ABNORMAL HIGH (ref 0.76–1.27)
EGFR: 56 mL/min/1.73 — ABNORMAL LOW
GLUCOSE: 190 mg/dL — ABNORMAL HIGH (ref 70–99)
POTASSIUM: 4.7 mmol/L (ref 3.5–5.2)
SODIUM: 138 mmol/L (ref 134–144)

## 2024-01-04 LAB — URINALYSIS WITH MICROSCOPY
BILIRUBIN UA: NEGATIVE
BLOOD UA: NEGATIVE
KETONES UA: NEGATIVE
LEUKOCYTE ESTERASE UA: NEGATIVE
NITRITE UA: NEGATIVE
PH UA: 6 (ref 5.0–7.5)
SPECIFIC GRAVITY UA: >=1.03 — AB (ref 1.005–1.030)
UROBILINOGEN UA: 0.2 mg/dL (ref 0.2–1.0)

## 2024-01-04 LAB — MICROSCOPIC EXAMINATION
BACTERIA: NONE SEEN
CASTS: NONE SEEN /LPF
EPITHELIAL CELLS (NON RENAL): NONE SEEN /HPF (ref 0–10)
RBC URINE: NONE SEEN /HPF (ref 0–2)
WBC URINE: NONE SEEN /HPF (ref 0–5)

## 2024-01-04 LAB — ALBUMIN / CREATININE URINE RATIO
MICROALB/CREAT RATIO: 378 mg/g{creat} — ABNORMAL HIGH (ref 0–29)
MICROALBUM.,U,RANDOM: 307 ug/mL

## 2024-01-04 LAB — MAGNESIUM: MAGNESIUM: 1.9 mg/dL (ref 1.6–2.3)

## 2024-01-04 LAB — PROTEIN / CREATININE RATIO, URINE
CREATININE URINE: 81.3 mg/dL
PROTEIN URINE: 56.2 mg/dL
PROTEIN/CREAT RATIO: 691 mg/g{creat} — ABNORMAL HIGH (ref 0–200)

## 2024-01-04 LAB — PHOSPHORUS: PHOSPHORUS, SERUM: 3.2 mg/dL (ref 2.8–4.1)

## 2024-01-05 LAB — TACROLIMUS LEVEL: TACROLIMUS BLOOD: 10.8 ng/mL (ref 5.0–20.0)

## 2024-01-19 DIAGNOSIS — G4733 Obstructive sleep apnea (adult) (pediatric): Secondary | ICD-10-CM | POA: Diagnosis not present

## 2024-01-23 DIAGNOSIS — Z940 Kidney transplant status: Principal | ICD-10-CM

## 2024-01-23 DIAGNOSIS — T8611 Kidney transplant rejection: Principal | ICD-10-CM

## 2024-02-10 LAB — CBC W/ DIFFERENTIAL
BANDED NEUTROPHILS ABSOLUTE COUNT: 0 x10E3/uL (ref 0.0–0.1)
BASOPHILS ABSOLUTE COUNT: 0 x10E3/uL (ref 0.0–0.2)
BASOPHILS RELATIVE PERCENT: 0 %
EOSINOPHILS ABSOLUTE COUNT: 0 x10E3/uL (ref 0.0–0.4)
EOSINOPHILS RELATIVE PERCENT: 1 %
HEMATOCRIT: 50.2 % (ref 37.5–51.0)
HEMOGLOBIN: 14.9 g/dL (ref 13.0–17.7)
IMMATURE GRANULOCYTES: 0 %
LYMPHOCYTES ABSOLUTE COUNT: 1.5 x10E3/uL (ref 0.7–3.1)
LYMPHOCYTES RELATIVE PERCENT: 17 %
MEAN CORPUSCULAR HEMOGLOBIN CONC: 29.7 g/dL — ABNORMAL LOW (ref 31.5–35.7)
MEAN CORPUSCULAR HEMOGLOBIN: 26.3 pg — ABNORMAL LOW (ref 26.6–33.0)
MEAN CORPUSCULAR VOLUME: 89 fL (ref 79–97)
MONOCYTES ABSOLUTE COUNT: 0.8 x10E3/uL (ref 0.1–0.9)
MONOCYTES RELATIVE PERCENT: 9 %
NEUTROPHILS ABSOLUTE COUNT: 6.2 x10E3/uL (ref 1.4–7.0)
NEUTROPHILS RELATIVE PERCENT: 73 %
PLATELET COUNT: 161 x10E3/uL (ref 150–450)
RED BLOOD CELL COUNT: 5.67 x10E6/uL (ref 4.14–5.80)
RED CELL DISTRIBUTION WIDTH: 13.2 % (ref 11.6–15.4)
WHITE BLOOD CELL COUNT: 8.6 x10E3/uL (ref 3.4–10.8)

## 2024-02-10 LAB — BASIC METABOLIC PANEL
BLOOD UREA NITROGEN: 21 mg/dL (ref 6–24)
BUN / CREAT RATIO: 14 (ref 9–20)
CALCIUM: 9.7 mg/dL (ref 8.7–10.2)
CHLORIDE: 104 mmol/L (ref 96–106)
CO2: 22 mmol/L (ref 20–29)
CREATININE: 1.49 mg/dL — ABNORMAL HIGH (ref 0.76–1.27)
EGFR: 58 mL/min/1.73 — ABNORMAL LOW
GLUCOSE: 153 mg/dL — ABNORMAL HIGH (ref 70–99)
POTASSIUM: 4.9 mmol/L (ref 3.5–5.2)
SODIUM: 140 mmol/L (ref 134–144)

## 2024-02-11 LAB — MAGNESIUM: MAGNESIUM: 1.5 mg/dL — ABNORMAL LOW (ref 1.6–2.3)

## 2024-02-11 LAB — PHOSPHORUS: PHOSPHORUS, SERUM: 3.3 mg/dL (ref 2.8–4.1)

## 2024-02-12 LAB — TACROLIMUS LEVEL: TACROLIMUS BLOOD: 10.2 ng/mL (ref 5.0–20.0)

## 2024-02-13 DIAGNOSIS — H401122 Primary open-angle glaucoma, left eye, moderate stage: Secondary | ICD-10-CM | POA: Diagnosis not present

## 2024-02-13 DIAGNOSIS — Z961 Presence of intraocular lens: Secondary | ICD-10-CM | POA: Diagnosis not present

## 2024-02-20 DIAGNOSIS — T8611 Kidney transplant rejection: Principal | ICD-10-CM

## 2024-02-20 DIAGNOSIS — Z940 Kidney transplant status: Principal | ICD-10-CM

## 2024-02-24 ENCOUNTER — Encounter: Payer: Self-pay | Admitting: Family Medicine

## 2024-02-24 ENCOUNTER — Ambulatory Visit: Admitting: Family Medicine

## 2024-02-24 VITALS — BP 182/90 | HR 75 | Temp 98.7°F | Ht 69.0 in | Wt 239.0 lb

## 2024-02-24 DIAGNOSIS — E1169 Type 2 diabetes mellitus with other specified complication: Secondary | ICD-10-CM | POA: Diagnosis not present

## 2024-02-24 DIAGNOSIS — D849 Immunodeficiency, unspecified: Secondary | ICD-10-CM

## 2024-02-24 DIAGNOSIS — H5461 Unqualified visual loss, right eye, normal vision left eye: Secondary | ICD-10-CM

## 2024-02-24 DIAGNOSIS — I10 Essential (primary) hypertension: Secondary | ICD-10-CM | POA: Diagnosis not present

## 2024-02-24 DIAGNOSIS — Z940 Kidney transplant status: Secondary | ICD-10-CM

## 2024-02-24 DIAGNOSIS — Z89512 Acquired absence of left leg below knee: Secondary | ICD-10-CM

## 2024-02-24 DIAGNOSIS — Z794 Long term (current) use of insulin: Secondary | ICD-10-CM | POA: Diagnosis not present

## 2024-02-24 DIAGNOSIS — Z7689 Persons encountering health services in other specified circumstances: Secondary | ICD-10-CM

## 2024-02-24 DIAGNOSIS — H401122 Primary open-angle glaucoma, left eye, moderate stage: Secondary | ICD-10-CM

## 2024-02-24 DIAGNOSIS — I2510 Atherosclerotic heart disease of native coronary artery without angina pectoris: Secondary | ICD-10-CM

## 2024-02-24 DIAGNOSIS — Z951 Presence of aortocoronary bypass graft: Secondary | ICD-10-CM

## 2024-02-24 LAB — MICROALBUMIN / CREATININE URINE RATIO
Creatinine,U: 181.8 mg/dL
Microalb Creat Ratio: 449.3 mg/g — ABNORMAL HIGH (ref 0.0–30.0)
Microalb, Ur: 81.7 mg/dL — ABNORMAL HIGH (ref 0.0–1.9)

## 2024-02-24 LAB — POCT GLYCOSYLATED HEMOGLOBIN (HGB A1C): Hemoglobin A1C: 8.4 % — AB (ref 4.0–5.6)

## 2024-02-24 NOTE — Progress Notes (Unsigned)
 Established Patient Office Visit   Subjective  Patient ID: Anthony Blanchard, male    DOB: November 19, 1975  Age: 48 y.o. MRN: [id number]  Chief Complaint  Patient presents with  . New Patient (Initial Visit)    Endo referral, rehab referral,   Pt accompanied by his sister Bobbette.  Pt is a 48 yo male seen for f/u on chronic issues and to est care.  Pt previously seen by Valery Ripple at [facility].  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 Has occasional edema and soreness with the prosthesis. Initially received physical therapy at home to learn how to walk and navigate steps with the prosthesis but has not had further follow-up.  DM: diagnosed at age 81.  Taking Novolog  and Lantus . On 4 units of Novolog  before breakfast, 8 units at lunch, and 6 units at dinner. He adjusted his Lantus  dose to 20 units due to experiencing high blood sugar levels with the higher dose of 30 units. His blood sugar readings fluctuate, with morning levels around 158-168 mg/dL. He has not seen an endocrinologist in over a year.  H/o renal transplant 9 years ago after being on dialysis for 10 years due to kidney failure, possibly related to diabetes. Followed by Dr. Gearline at [facility] in [place] and Dr. Clemencia in [place], although he has not seen them in about a year.  HTN:  On metoprolol  75 mg BID. Having dizziness in the past month despite stable blood pressure and blood sugar levels. His home blood pressure readings have been around 154/62 mmHg.  Drinking 2 bottles of water per day.  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 The right eye did not heal properly due to collapsed veins, and he has small cataracts and glaucoma in the left eye. He follows up with Dr. Billie for his eye care.  H/o heart disease, including a past heart attack and three stents placed. He has not had  regular follow-up with a cardiologist since moving back to [place].  Allergies: Shellfish- swelling and emesis.    Patient Active Problem List   Diagnosis Date Noted  . DKA (diabetic ketoacidosis) (HCC) 10/28/2019  . Sepsis (HCC) 10/28/2019  . Essential hypertension 10/28/2019  . Hyperkalemia 10/28/2019  . CKD (chronic kidney disease) stage 4, GFR 15-29 ml/min (HCC) 10/28/2019  . Diabetic foot ulcer (HCC) 10/28/2019  . DKA (diabetic ketoacidosis) (HCC) 10/28/2019  . Hyperlipidemia 02/22/2019  . Trigger middle finger of left hand 12/18/2018  . CAD (coronary artery disease) 08/29/2017  . 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 Aftercare following organ transplant 12/12/2014  . 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  . Immunosuppressed status 12/12/2014  . LVH (left ventricular hypertrophy) due to hypertensive disease 12/12/2014  . 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/10/2014  . Diabetes mellitus (HCC) 01/05/2011  . Hypertension, benign 12/25/2009   Past Medical History:  Diagnosis Date  . Abnormal results of thyroid  function studies 08/16/2011  . Chronic kidney disease   . Coronary artery disease   . Diabetes mellitus without complication (HCC)   . Diabetic foot ulcers (HCC) 10/2019  . GERD (gastroesophageal reflux disease)   . Glaucoma   . Hypertension   . Overweight   . Partial blindness    Past Surgical History:  Procedure Laterality Date  . BONE BIOPSY Bilateral 11/03/2019   Procedure: BONE BIOPSY;  Surgeon: Burt Fus, DPM;  Location: [facility] OR;  Service:  Podiatry;  Laterality: Bilateral;  . CARDIAC CATHETERIZATION    . EYE SURGERY    . GRAFT APPLICATION Bilateral 11/03/2019   Procedure: GRAFT APPLICATION;  Surgeon: Burt Fus, DPM;  Location: [facility] OR;  Service: Podiatry;  Laterality: Bilateral;  Integra bilayer 8x10  . INCISION AND DRAINAGE OF WOUND Bilateral 11/03/2019   Procedure: IRRIGATION AND DEBRIDEMENT WOUND;  Surgeon: Burt Fus, DPM;  Location: [facility] OR;  Service: Podiatry;   Laterality: Bilateral;  . 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   Social History   Tobacco Use  . Smoking status: Never  . Smokeless tobacco: Never  Vaping Use  . Vaping status: Never Used  Substance Use Topics  . Alcohol use: Not Currently  . Drug use: Never   Family History  Problem Relation Age of Onset  . Diabetes Mother   . Alcohol abuse Father    Allergies  Allergen Reactions  . Shellfish Allergy Anaphylaxis  . Shellfish Protein-Containing Drug Products Anaphylaxis  . Sulfa Antibiotics Anaphylaxis    Other reaction(s): Other (See Comments)  . Iodinated Contrast Media Other (See Comments)    Other reaction(s): Other (see comments)  . Iodine Other (See Comments)    Other reaction(s): Other (see comments)    ROS Negative unless stated above    Objective:     BP (!) 182/90 (BP Location: Left Arm, Patient Position: Sitting, Cuff Size: Large)   Pulse 75   Temp 98.7 F (37.1 C) (Oral)   Ht 5' 9 (1.753 m)   Wt 239 lb (108.4 kg)   SpO2 94%   BMI 35.29 kg/m  BP Readings from Last 3 Encounters:  03/23/23 (!) 160/70  03/16/23 (!) 160/84  11/05/19 139/79   Wt Readings from Last 3 Encounters:  03/23/23 254 lb 9.6 oz (115.5 kg)  03/16/23 250 lb 12.8 oz (113.8 kg)  11/04/19 219 lb 9.3 oz (99.6 kg)      Physical Exam Constitutional:      General: He is not in acute distress.    Appearance: Normal appearance. He is obese.  HENT:     Head: Normocephalic and atraumatic.     Nose: Nose normal.     Mouth/Throat:     Mouth: Mucous membranes are moist.  Eyes:     Comments: Legally blind R eye.  Cardiovascular:     Rate and Rhythm: Normal rate and regular rhythm.     Heart sounds: Normal heart sounds. No murmur heard.    No gallop.  Pulmonary:     Effort: Pulmonary effort is normal. No respiratory distress.     Breath sounds: Normal breath sounds. No wheezing, rhonchi or rales.  Musculoskeletal:     Left Lower Extremity: Left leg is amputated below knee.  Skin:     General: Skin is warm and dry.  Neurological:     Mental Status: He is alert and oriented to person, place, and time.  Psychiatric:        Behavior: Behavior is cooperative.        02/24/2024    2:25 PM  Depression screen PHQ 2/9  Decreased Interest 0  Down, Depressed, Hopeless 0  PHQ - 2 Score 0  Altered sleeping 1  Tired, decreased energy 1  Change in appetite 1  Feeling bad or failure about yourself  1  Trouble concentrating 0  Moving slowly or fidgety/restless 0  Suicidal thoughts 0  PHQ-9 Score 4  Difficult doing work/chores Not difficult at all      02/24/2024  2:26 PM  GAD 7 : Generalized Anxiety Score  Nervous, Anxious, on Edge 1  Control/stop worrying 1  Worry too much - different things 1  Trouble relaxing 1  Restless 0  Easily annoyed or irritable 0  Afraid - awful might happen 0  Total GAD 7 Score 4  Anxiety Difficulty Not difficult at all     No results found for any visits on 02/24/24.    Assessment & Plan:   Type 2 diabetes mellitus with other specified complication, with long-term current use of insulin  (HCC) -     Ambulatory referral to Endocrinology -     POCT glycosylated hemoglobin (Hb A1C) -     Ambulatory referral to Cardiology -     Microalbumin / creatinine urine ratio  Essential hypertension -     Ambulatory referral to Cardiology  Renal transplant recipient -     Ambulatory referral to Cardiology  Left below-knee amputee [facility]) -     Ambulatory referral to Physical Therapy  Right eye blindness of unknown category, unspecified left eye visual impairment category  Immunosuppressed status  Primary open angle glaucoma (POAG) of left eye, moderate stage  Encounter to establish care  CVD (cardiovascular disease) -     Ambulatory referral to Cardiology  S/P triple vessel bypass -     Ambulatory referral to Cardiology  Type 2 diabetes mellitus, insulin  dependent   Blood glucose levels fluctuate with high morning readings,  and there has been no recent endocrinologist follow-up. Dietary habits contribute to glucose variability. Checked A1c level. Referred to endocrinologist for diabetes management. Advised dietary modifications to reduce sugar intake.  Renal transplant status   Renal transplant occurred 9 years ago with no current issues reported. Ensure follow-up with nephrologist, possibly in December.  Essential hypertension   Managed with metoprolol . Occasional dizziness may be due to dehydration. Rechecked blood pressure and advised increasing water intake.  Coronary artery disease with history of myocardial infarction and coronary stents   Three coronary stents placed with no recent cardiology follow-up and no current symptoms. Referred to cardiologist for follow-up.  Lower limb amputation with prosthesis   Prosthesis fitted in September 2021 with occasional swelling and soreness noted.  Legal blindness, right eye; cataract and glaucoma, left eye   Right eye lost in 2008. Left eye has cataracts and glaucoma, followed by Dr. Billie.  Shellfish allergy   Allergy causes swelling and sickness upon exposure. Avoid shellfish.  Return in about 2 months (around 04/25/2024) for chronic conditions.   Clotilda JONELLE Single, MD

## 2024-02-27 ENCOUNTER — Ambulatory Visit: Payer: Self-pay | Admitting: Family Medicine

## 2024-02-28 ENCOUNTER — Ambulatory Visit: Admitting: Podiatry

## 2024-02-29 ENCOUNTER — Ambulatory Visit: Admitting: Podiatry

## 2024-03-02 ENCOUNTER — Ambulatory Visit: Attending: Family Medicine

## 2024-03-02 DIAGNOSIS — R2689 Other abnormalities of gait and mobility: Secondary | ICD-10-CM | POA: Insufficient documentation

## 2024-03-02 DIAGNOSIS — Z89512 Acquired absence of left leg below knee: Secondary | ICD-10-CM | POA: Insufficient documentation

## 2024-03-02 DIAGNOSIS — R2681 Unsteadiness on feet: Secondary | ICD-10-CM | POA: Insufficient documentation

## 2024-03-02 DIAGNOSIS — M6281 Muscle weakness (generalized): Secondary | ICD-10-CM | POA: Insufficient documentation

## 2024-03-02 NOTE — Therapy (Signed)
 OUTPATIENT PHYSICAL THERAPY PROSTHETICS EVALUATION   Patient Name: Anthony Blanchard MRN: [id number] DOB:[birth year], 48 y.o., male Today's Date: 03/02/2024  PCP: Dr. Clotilda Single REFERRING PROVIDER: Single Clotilda SAUNDERS, MD  END OF SESSION:  PT End of Session - 03/02/24 1030     Visit Number 1    Number of Visits 13    Date for Recertification  04/27/24    Authorization Type [employer] auth required    PT Start Time 1025    PT Stop Time 1105    PT Time Calculation (min) 40 min    Equipment Utilized During Treatment Gait belt    Activity Tolerance Patient tolerated treatment well    Behavior During Therapy [facility] for tasks assessed/performed          Past Medical History:  Diagnosis Date   Abnormal results of thyroid  function studies 08/16/2011   Chronic kidney disease    Coronary artery disease    Diabetes mellitus without complication (HCC)    Diabetic foot ulcers (HCC) 10/2019   GERD (gastroesophageal reflux disease)    Glaucoma    Hypertension    Overweight    Partial blindness    Past Surgical History:  Procedure Laterality Date   BONE BIOPSY Bilateral 11/03/2019   Procedure: BONE BIOPSY;  Surgeon: Burt Fus, DPM;  Location: [facility] OR;  Service: Podiatry;  Laterality: Bilateral;   CARDIAC CATHETERIZATION     EYE SURGERY     GRAFT APPLICATION Bilateral 11/03/2019   Procedure: GRAFT APPLICATION;  Surgeon: Burt Fus, DPM;  Location: [facility] OR;  Service: Podiatry;  Laterality: Bilateral;  Integra bilayer 8x10   INCISION AND DRAINAGE OF WOUND Bilateral 11/03/2019   Procedure: IRRIGATION AND DEBRIDEMENT WOUND;  Surgeon: Burt Fus, DPM;  Location: [facility] OR;  Service: Podiatry;  Laterality: Bilateral;   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   Patient Active Problem List   Diagnosis Date Noted   DKA (diabetic ketoacidosis) (HCC) 10/28/2019   Sepsis (HCC) 10/28/2019   Essential hypertension 10/28/2019   Hyperkalemia 10/28/2019   CKD (chronic kidney disease) stage 4, GFR  15-29 ml/min (HCC) 10/28/2019   Diabetic foot ulcer (HCC) 10/28/2019   DKA (diabetic ketoacidosis) (HCC) 10/28/2019   Hyperlipidemia 02/22/2019   Trigger middle finger of left hand 12/18/2018   CAD (coronary artery disease) 08/29/2017   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   Aftercare following organ transplant 12/12/2014   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   Immunosuppressed status 12/12/2014   LVH (left ventricular hypertrophy) due to hypertensive disease 12/12/2014   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 Pt received his third prosthesis recently from [employer] about a year ago. Pt is wearing prosthetic leg upto 8 hours. Pt lives alone in an apartment with stairs to enter. Family leaves close by and will bring him to therapy. Pt wakes up uses his scooter to go to the bathroom, then goes to the living room and puts on prosthetic leg and then cooks his breakfast. Pt has caregiver that comes his home 4x/week for 2 hours a day. Caregiver primarily cleans, and some  cooking.   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 No  PRECAUTIONS: Fall  RED FLAGS: None   WEIGHT BEARING RESTRICTIONS: No  FALLS: Has patient fallen in last 6 months? No (last fall >2 years ago)  LIVING ENVIRONMENT: Lives with: lives  alone Lives in: House/apartment Home Access: Stairs to enter Home layout: One level Stairs: Yes: 8 steps to enter the apartment, and can reach both rails Has following equipment at home: Single point cane and knee scooter .Uses knee scooter to go to the bathroom   PLOF: Requires assistive device for independence and Needs assistance with homemaking  PATIENT GOALS: Want to be able to go on the incline and overall balance, walking on different terrain.  OBJECTIVE:  Note: Objective measures were completed at Evaluation unless otherwise noted.    COGNITION: Overall cognitive status: Within functional limits for tasks assessed   GAIT: Gait pattern: decreased arm swing- Right, decreased arm swing- Left, decreased step length- Right, decreased stance time- Right, decreased stride length, decreased hip/knee flexion- Left, antalgic, and wide BOS Distance walked: 230' Assistive device utilized: Single point cane Level of assistance: CGA and Min A Gait velocity: 0.55 m/sec   FUNCTIONAL TESTS:  10 meter walk test: 0.55 m/s (<0.50 indicates high fall risk, 0.50 to 0.80 m/s is household ambulator) Functional gait assessment:  FUNCTIONAL GAIT ASSESSMENT  Date: 03/02/24 Score  GAIT LEVEL SURFACE Instructions: Walk at your normal speed from here to the next mark (6 m) [20 ft]. (1) Moderate impairment-Walks 6 m (20 ft), slow speed, abnormal gait pattern, evidence for imbalance, or deviates 25.4 - 38.1 cm (10 -15 in) outside of the 30.48-cm (12-in) walkway width. Requires more than 7 seconds to ambulate 6 m (20 ft).  2.   CHANGE IN GAIT SPEED Instructions: Begin walking at your normal pace (for 1.5 m [5 ft]). When I tell you "go," walk as fast as you can (for 1.5 m [5 ft]). When I tell you "slow," walk as slowly as you can (for 1.5 m [5 ft]. (0) Severe impairment - Cannot change speeds, deviates greater than 38.1 cm (15 in) outside 30.48-cm (12-in) walkway width, or loses balance and has to reach for  wall or be caught.  3.    GAIT WITH HORIZONTAL HEAD TURNS Instructions: Walk from here to the next mark 6 m (20 ft) away. Begin walking at your normal pace. Keep walking straight; after 3 steps, turn your head to the right and keep walking straight while looking to the right. After 3 more steps, turn your head to the left and keep walking straight while looking left. Continue alternating looking right and left. (0) Severe impairment-Performs task with severe disruption of gait (eg, staggers 38.1 cm [15 in] outside 30.48-cm (12-in) walkway width, loses balance, stops, or reaches for wall).  4.   GAIT WITH VERTICAL HEAD TURNS Instructions: Walk from here to the next mark (6 m [20 ft]). Begin walking at your normal pace. Keep walking straight; after 3 steps, tip your head up and keep walking straight while looking up. After 3 more steps, tip your head down, keep walking straight while looking down. Continue  alternating looking up and down every 3 steps until you have completed 2 repetitions in each direction. (0) Severe impairment - Performs task with severe disruption of gait (eg,  staggers 38.1 cm [15 in] outside 30.48-cm (12-in) walkway width, loses balance, stops, reaches for wall).  5.  GAIT AND PIVOT TURN Instructions: Begin with walking at your normal pace. When I tell you, "turn and stop," turn as quickly as you can to face the opposite direction and stop. (1) Moderate impairment - Turns slowly, requires verbal cueing, or requires several small steps to catch balance following turn and stop  6.   STEP OVER OBSTACLE Instructions: Begin walking at your normal speed. When you come to the shoe box, step over it, not around it, and keep walking. (1) Moderate impairment - Is able to step over one shoe box (11.43 cm [4.5 in] total height) but must slow down and adjust steps to clear box safely. May require verbal cueing.  7.   GAIT WITH NARROW BASE OF SUPPORT Instructions: Walk on the floor with arms folded  across the chest, feet aligned heel to toe in tandem for a distance of 3.6 m [12 ft]. The number of steps taken in a straight line are counted for a maximum of 10 steps. (0) Severe impairment - Ambulates less than 4 steps heel to toe or cannot perform without assistance.  8.   GAIT WITH EYES CLOSED Instructions: Walk at your normal speed from here to the next mark (6 m [20 ft]) with your eyes closed. (2) Mild impairment - Walks 6 m (20 ft), uses assistive device, slower speed, mild gait deviations, deviates 15.24 -25.4 cm (6 -10 in) outside 30.48-cm (12-in) walkway width. Ambulates 6 m (20 ft) in less than 9 seconds but greater than 7 seconds  9.   AMBULATING BACKWARDS Instructions: Walk backwards until I tell you to stop (2) Mild impairment - Walks 6 m (20 ft), uses assistive device, slower speed, mild gait deviations, deviates 15.24 -25.4 cm (6 -10 in) outside 30.48-cm (12-in) walkway width  10. STEPS Instructions: Walk up these stairs as you would at home (ie, using the rail if necessary). At the top turn around and walk down. (1) Moderate impairment-Two feet to a stair; must use rail.  Total 8/30   Interpretation of scores: Non-Specific Older Adults Cutoff Score: <=22/30 = risk of falls Parkinson's Disease Cutoff score <15/30= fall risk (Hoehn & Yahr 1-4)  Minimally Clinically Important Difference (MCID)  Stroke (acute, subacute, and chronic) = MDC: 4.2 points Vestibular (acute) = MDC: 6 points Community Dwelling Older Adults =  MCID: 4 points Parkinson's Disease  =  MDC: 4.3 points  (Academy of Neurologic Physical Therapy (nd). Functional Gait Assessment. Retrieved from https://www.neuropt.org/docs/default-source/cpgs/core-outcome-measures/function-gait-assessment-pocket-guide-proof9-(2).pdf?sfvrsn=b68f35043_0.)  CCURRENT PROSTHETIC WEAR ASSESSMENT: Patient is independent with: skin check, residual limb care, care of non-amputated limb, prosthetic cleaning, ply sock cleaning, proper wear  schedule/adjustment, and proper weight-bearing schedule/adjustment Patient is dependent with: correct ply sock adjustment Donning prosthesis: Complete Independence Doffing prosthesis: Complete Independence Prosthetic wear tolerance: 8+ hours/day, 7 days/week Prosthetic weight bearing tolerance: 30-45 minutes Edema: minimal  Peripheral vision patient has about 5 deg of peripheral vision on left side and about 20 deg to his R side. Pt is only able to clearly see things when they are directly in fron of him. Pt educated on his current peripheral vision and strategies with head turn to ensure safety with obstacle negotiation, environmental scanning and different levels of walking surfaces.  TREATMENT DATE: 03/02/24   TREATMENT:  PATIENT EDUCATED ON FOLLOWING PROSTHETIC CARE: Prosthetic wear tolerance: 8+ hours/day, 7 days/week Prosthetic weight bearing tolerance: 60 minutes Other education  Correct ply sock adjustment   PATIENT EDUCATION: Education details: patient and sister Person educated: Patient Education method: Explanation Education comprehension: verbalized understanding  HOME EXERCISE PROGRAM: TBD  ASSESSMENT:  CLINICAL IMPRESSION: Patient is a 48 y.o. male who was seen today for physical therapy evaluation and treatment for gait and mobility training for L AKA. 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 Patient also is blind in R eye and very poor vision in his left eye which impacts his overall confidence with gait and balance. Patient scored 8/30 on functional gait assessment indicating increased risk for fall. Patient's gait speed with st. Cane and prosthetic leg is 0.55 m/s. Based on above objective impairments, patient  is at high risk for fall and will benefit from skilled PT to improve his gait, balance and reduce fall risk.   OBJECTIVE IMPAIRMENTS: Abnormal gait, decreased activity tolerance, decreased balance, decreased endurance, decreased mobility, difficulty walking, decreased ROM, decreased strength, increased edema, impaired flexibility, impaired sensation, impaired vision/preception, improper body mechanics, postural dysfunction, and prosthetic dependency .   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   REHAB POTENTIAL: Good  CLINICAL DECISION MAKING: Stable/uncomplicated  EVALUATION COMPLEXITY: High   GOALS: Goals reviewed with patient? Yes  SHORT TERM GOALS: Target date: 03/30/24  Patient will be able to ambulate >500 feet with st. Point cane and his prosthetic leg to improve short distance ambulation. Baseline: 230' Goal status: INITIAL  2.  Patient will demo compliance with HEP to start self managing his symptoms. Baseline: TBD Goal status: INITIAL   LONG TERM GOALS: Target date: 04/27/24  Patient will demo gait speed of 0.80 m/s with st. Point cane to improve walking speed and decrease fall risk.  Baseline: 0.55 m/s with prosthetic leg and st. Point cane. Goal status: INITIAL  2.  Patient will demo FGA of >15/30 to reduce fall risk.  Baseline: 8/30 (eval) Goal status: INITIAL  3.  Patient will be able to ambulate on grass for 200 feet with st point cane, Baseline: not attempted Goal status: INITIAL  4.  Patient will be able to perform visual  scanning to find objects in a busy environment with 100% accuracy to improve ability to navigate through grocery store and find items.  Baseline: relies on sister/family to help with shopping. Goal status: INITIAL     PLAN:  PT FREQUENCY: 2x/week  PT DURATION: 8 weeks  PLANNED INTERVENTIONS: [id number]- PT Re-evaluation, 97110-Therapeutic exercises, 97530- Therapeutic activity, [id number]- Neuromuscular re-education, 97535- Self Care, [id number]- Manual therapy, [phone]- Gait training, and Patient/Family education  PLAN FOR NEXT SESSION: Issue HEP, work on visual scanning, walkign on different level surfaces.   Raj LOISE Blanch, PT 03/02/2024, 2:51 PM

## 2024-03-05 ENCOUNTER — Other Ambulatory Visit: Payer: Self-pay

## 2024-03-06 MED FILL — NOVOLOG FLEXPEN U-100 INSULIN ASPART 100 UNIT/ML (3 ML) SUBCUTANEOUS: SUBCUTANEOUS | 55 days supply | Qty: 30 | Fill #5

## 2024-03-07 ENCOUNTER — Encounter: Payer: Self-pay | Admitting: Podiatry

## 2024-03-07 ENCOUNTER — Ambulatory Visit: Admitting: Physical Therapy

## 2024-03-07 ENCOUNTER — Ambulatory Visit (INDEPENDENT_AMBULATORY_CARE_PROVIDER_SITE_OTHER): Admitting: Podiatry

## 2024-03-07 VITALS — BP 161/78 | HR 66

## 2024-03-07 DIAGNOSIS — B351 Tinea unguium: Secondary | ICD-10-CM | POA: Diagnosis not present

## 2024-03-07 DIAGNOSIS — R2689 Other abnormalities of gait and mobility: Secondary | ICD-10-CM | POA: Diagnosis not present

## 2024-03-07 DIAGNOSIS — M79674 Pain in right toe(s): Secondary | ICD-10-CM | POA: Diagnosis not present

## 2024-03-07 DIAGNOSIS — Z794 Long term (current) use of insulin: Secondary | ICD-10-CM

## 2024-03-07 DIAGNOSIS — Z89512 Acquired absence of left leg below knee: Secondary | ICD-10-CM | POA: Diagnosis not present

## 2024-03-07 DIAGNOSIS — R2681 Unsteadiness on feet: Secondary | ICD-10-CM | POA: Diagnosis not present

## 2024-03-07 DIAGNOSIS — E1169 Type 2 diabetes mellitus with other specified complication: Secondary | ICD-10-CM

## 2024-03-07 DIAGNOSIS — M6281 Muscle weakness (generalized): Secondary | ICD-10-CM | POA: Diagnosis not present

## 2024-03-07 NOTE — Therapy (Signed)
 OUTPATIENT PHYSICAL THERAPY PROSTHETICS TREATMENT   Patient Name: Anthony Blanchard MRN: [id number] DOB:March 05, 1976, 48 y.o., male Today's Date: 03/07/2024  PCP: Dr. Clotilda Single REFERRING PROVIDER: Single Clotilda SAUNDERS, MD  END OF SESSION:  PT End of Session - 03/07/24 1110     Visit Number 2    Number of Visits 13    Date for Recertification  04/27/24    Authorization Type [employer]    Authorization Time Period 03/02/24 - 05/31/24 10 PT visits    PT Start Time 1107   Pt arrived late   PT Stop Time 1147    PT Time Calculation (min) 40 min    Equipment Utilized During Treatment Gait belt    Activity Tolerance Patient tolerated treatment well    Behavior During Therapy [facility] for tasks assessed/performed           Past Medical History:  Diagnosis Date   Abnormal results of thyroid  function studies 08/16/2011   Chronic kidney disease    Coronary artery disease    Diabetes mellitus without complication (HCC)    Diabetic foot ulcers (HCC) 10/2019   GERD (gastroesophageal reflux disease)    Glaucoma    Hypertension    Overweight    Partial blindness    Past Surgical History:  Procedure Laterality Date   BONE BIOPSY Bilateral 11/03/2019   Procedure: BONE BIOPSY;  Surgeon: Burt Fus, DPM;  Location: [facility] OR;  Service: Podiatry;  Laterality: Bilateral;   CARDIAC CATHETERIZATION     EYE SURGERY     GRAFT APPLICATION Bilateral 11/03/2019   Procedure: GRAFT APPLICATION;  Surgeon: Burt Fus, DPM;  Location: [facility] OR;  Service: Podiatry;  Laterality: Bilateral;  Integra bilayer 8x10   INCISION AND DRAINAGE OF WOUND Bilateral 11/03/2019   Procedure: IRRIGATION AND DEBRIDEMENT WOUND;  Surgeon: Burt Fus, DPM;  Location: [facility] OR;  Service: Podiatry;  Laterality: Bilateral;   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   Patient Active Problem List   Diagnosis Date Noted   DKA (diabetic ketoacidosis) (HCC) 10/28/2019   Sepsis (HCC) 10/28/2019   Essential hypertension 10/28/2019    Hyperkalemia 10/28/2019   CKD (chronic kidney disease) stage 4, GFR 15-29 ml/min (HCC) 10/28/2019   Diabetic foot ulcer (HCC) 10/28/2019   DKA (diabetic ketoacidosis) (HCC) 10/28/2019   Hyperlipidemia 02/22/2019   Trigger middle finger of left hand 12/18/2018   CAD (coronary artery disease) 08/29/2017   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   Aftercare following organ transplant 12/12/2014   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   Immunosuppressed status 12/12/2014   LVH (left ventricular hypertrophy) due to hypertensive disease 12/12/2014   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/10/2014   Diabetes mellitus (HCC) 01/05/2011   Hypertension, benign 12/25/2009    ONSET DATE: 02/24/2024- date of referral  REFERRING DIAG: Z89.512 (ICD-10-CM) - Left below-knee amputee (HCC)  THERAPY DIAG:  Other abnormalities of gait and mobility  Muscle weakness (generalized)  Unsteadiness on feet  Rationale for Evaluation and Treatment: Rehabilitation  SUBJECTIVE:   SUBJECTIVE STATEMENT: Pt presents w/SPC, required min A to safely navigate through clinic. Denies falls or acute changes.   Pt accompanied by: self  PERTINENT HISTORY:  diabetes mellitus, chronic kidney disease status post kidney transplant, movement of right eye complication of surgery, CAD, hypertension  PAIN:  Are you having pain? No  PRECAUTIONS: Fall, blind in R eye, severely impaired vision in L eye   RED FLAGS: None   WEIGHT BEARING RESTRICTIONS: No  FALLS: Has patient fallen in last 6 months? No (last fall >  2 years ago)  LIVING ENVIRONMENT: Lives with: lives alone Lives in: House/apartment Home Access: Stairs to enter Home layout: One level Stairs: Yes: 8 steps to enter the apartment, and can reach both rails Has following equipment at home: Single point cane and knee scooter .Uses knee scooter to go to the bathroom   PLOF: Requires assistive device for independence and Needs assistance with homemaking  PATIENT GOALS: Want to be able  to go on the incline and overall balance, walking on different terrain.  OBJECTIVE:  Note: Objective measures were completed at Evaluation unless otherwise noted.    COGNITION: Overall cognitive status: Within functional limits for tasks assessed   GAIT: Gait pattern: decreased arm swing- Right, decreased arm swing- Left, decreased step length- Right, decreased stance time- Right, decreased stride length, decreased hip/knee flexion- Left, antalgic, and wide BOS Distance walked: 230' Assistive device utilized: Single point cane Level of assistance: CGA and Min A Gait velocity: 0.55 m/sec   FUNCTIONAL TESTS:  10 meter walk test: 0.55 m/s (<0.50 indicates high fall risk, 0.50 to 0.80 m/s is household ambulator) Functional gait assessment:  FUNCTIONAL GAIT ASSESSMENT  Date: 03/02/24 Score  GAIT LEVEL SURFACE Instructions: Walk at your normal speed from here to the next mark (6 m) [20 ft]. (1) Moderate impairment-Walks 6 m (20 ft), slow speed, abnormal gait pattern, evidence for imbalance, or deviates 25.4 - 38.1 cm (10 -15 in) outside of the 30.48-cm (12-in) walkway width. Requires more than 7 seconds to ambulate 6 m (20 ft).  2.   CHANGE IN GAIT SPEED Instructions: Begin walking at your normal pace (for 1.5 m [5 ft]). When I tell you "go," walk as fast as you can (for 1.5 m [5 ft]). When I tell you "slow," walk as slowly as you can (for 1.5 m [5 ft]. (0) Severe impairment - Cannot change speeds, deviates greater than 38.1 cm (15 in) outside 30.48-cm (12-in) walkway width, or loses balance and has to reach for wall or be caught.  3.    GAIT WITH HORIZONTAL HEAD TURNS Instructions: Walk from here to the next mark 6 m (20 ft) away. Begin walking at your normal pace. Keep walking straight; after 3 steps, turn your head to the right and keep walking straight while looking to the right. After 3 more steps, turn your head to the left and keep walking straight while looking left. Continue  alternating looking right and left. (0) Severe impairment-Performs task with severe disruption of gait (eg, staggers 38.1 cm [15 in] outside 30.48-cm (12-in) walkway width, loses balance, stops, or reaches for wall).  4.   GAIT WITH VERTICAL HEAD TURNS Instructions: Walk from here to the next mark (6 m [20 ft]). Begin walking at your normal pace. Keep walking straight; after 3 steps, tip your head up and keep walking straight while looking up. After 3 more steps, tip your head down, keep walking straight while looking down. Continue  alternating looking up and down every 3 steps until you have completed 2 repetitions in each direction. (0) Severe impairment - Performs task with severe disruption of gait (eg, staggers 38.1 cm [15 in] outside 30.48-cm (12-in) walkway width, loses balance, stops, reaches for wall).  5.  GAIT AND PIVOT TURN Instructions: Begin with walking at your normal pace. When I tell you, "turn and stop," turn as quickly as you can to face the opposite direction and stop. (1) Moderate impairment - Turns slowly, requires verbal cueing, or requires several small steps to  catch balance following turn and stop  6.   STEP OVER OBSTACLE Instructions: Begin walking at your normal speed. When you come to the shoe box, step over it, not around it, and keep walking. (1) Moderate impairment - Is able to step over one shoe box (11.43 cm [4.5 in] total height) but must slow down and adjust steps to clear box safely. May require verbal cueing.  7.   GAIT WITH NARROW BASE OF SUPPORT Instructions: Walk on the floor with arms folded across the chest, feet aligned heel to toe in tandem for a distance of 3.6 m [12 ft]. The number of steps taken in a straight line are counted for a maximum of 10 steps. (0) Severe impairment - Ambulates less than 4 steps heel to toe or cannot perform without assistance.  8.   GAIT WITH EYES CLOSED Instructions: Walk at your normal speed from here to the next mark (6 m [20  ft]) with your eyes closed. (2) Mild impairment - Walks 6 m (20 ft), uses assistive device, slower speed, mild gait deviations, deviates 15.24 -25.4 cm (6 -10 in) outside 30.48-cm (12-in) walkway width. Ambulates 6 m (20 ft) in less than 9 seconds but greater than 7 seconds  9.   AMBULATING BACKWARDS Instructions: Walk backwards until I tell you to stop (2) Mild impairment - Walks 6 m (20 ft), uses assistive device, slower speed, mild gait deviations, deviates 15.24 -25.4 cm (6 -10 in) outside 30.48-cm (12-in) walkway width  10. STEPS Instructions: Walk up these stairs as you would at home (ie, using the rail if necessary). At the top turn around and walk down. (1) Moderate impairment-Two feet to a stair; must use rail.  Total 8/30   Interpretation of scores: Non-Specific Older Adults Cutoff Score: <=22/30 = risk of falls Parkinson's Disease Cutoff score <15/30= fall risk (Hoehn & Yahr 1-4)  Minimally Clinically Important Difference (MCID)  Stroke (acute, subacute, and chronic) = MDC: 4.2 points Vestibular (acute) = MDC: 6 points Community Dwelling Older Adults =  MCID: 4 points Parkinson's Disease  =  MDC: 4.3 points  (Academy of Neurologic Physical Therapy (nd). Functional Gait Assessment. Retrieved from https://www.neuropt.org/docs/default-source/cpgs/core-outcome-measures/function-gait-assessment-pocket-guide-proof9-(2).pdf?sfvrsn=b6f35043_0.)  CCURRENT PROSTHETIC WEAR ASSESSMENT: Patient is independent with: skin check, residual limb care, care of non-amputated limb, prosthetic cleaning, ply sock cleaning, proper wear schedule/adjustment, and proper weight-bearing schedule/adjustment Patient is dependent with: correct ply sock adjustment Donning prosthesis: Complete Independence Doffing prosthesis: Complete Independence Prosthetic wear tolerance: 8+ hours/day, 7 days/week Prosthetic weight bearing tolerance: 30-45 minutes Edema: minimal  Peripheral vision patient has about 5 deg of  peripheral vision on left side and about 20 deg to his R side. Pt is only able to clearly see things when they are directly in fron of him. Pt educated on his current peripheral vision and strategies with head turn to ensure safety with obstacle negotiation, environmental scanning and different levels of walking surfaces.   VITALS  Vitals:   03/07/24 1115  BP: (!) 161/78  Pulse: 66  TREATMENT DATE:   Self-care/home management  Assessed vitals (see above) and BP elevated but within limits for PT. Pt reports he took his BP medication late this AM  Discussed importance of being safe at home and practicing his visual scanning just as he does in community. Pt verbalized understanding.   NMR  From mat table, performed 10 Sit to stands w/equal BLE placement, as pt tends to kickstand LLE to stand. Pt required BUE support on first 3 reps but was then able to perform without UE support. Pt reported mild fatigue in LLE, but no LOB noted  Progressed to 10 sit to stands w/staggered stance (LLE posterior) for improved weightbearing tolerance on LLE. Pt again required BUE support for first 3 reps but was able to perform the rest w/no UE support. Pt w/increased difficulty stabilizing w/LLE posterior, but no LOB noted. Added to HEP (see bolded below) and provided printout in 18 point font for pt due to visual impairments.  With heels elevated on Budney half foam roll, heel elevated squats, 2x5 reps, for improved eccentric quad strength and stability on decline. CGA for safety. Pt performed well, but did demonstrate anterior instability on second set due to fatigue.  At ballet bar, 5 Blaze pods placed on mirror on random reach setting for improved visual scanning, weightbearing tolerance on LLE and stability reaching out of BOS.  Performed on 2 minute intervals with 30s rest periods.  Pt  requires SBA guarding and intermittent UE support. Round 1:  elevated R foot on 2 step and placed 5 pods from 9-3 o'clock on mirror around pt.  38 hits. Round 2:  same setup but cued pt for more narrow BOS.  49 hits. Round 3:  same setup but cued pt to not rely on UE support on rail.  49 hits. Notable errors/deficits:  increased difficulty scanning to L side in inferior field.   Gait pattern: step through pattern, lateral hip instability, decreased trunk rotation, and wide BOS Distance walked: Various clinic distances  Assistive device utilized: Single point cane Level of assistance: Min A Comments: Pt requires min A to safely navigate clinic due to impaired vision     PATIENT EDUCATION: Education details: initial HEP, work on patent attorney scanning at home  Person educated: Patient Education method: Programmer, Multimedia, Facilities Manager, and Handouts Education comprehension: verbalized understanding, returned demonstration, and needs further education  HOME EXERCISE PROGRAM: Access Code: [facility] URL: [email or web address] Date: 03/07/2024 Prepared by: Marlon Emonnie Cannady  Exercises - Staggered Sit-to-Stand  - 1 x daily - 7 x weekly - 1-2 sets - 8-10 reps  ASSESSMENT:  CLINICAL IMPRESSION: Emphasis of skilled PT session on monitoring vitals, improved weightbearing tolerance on LLE and visual scanning. Pt's BP elevated today but within limits for session. Pt demonstrates kickstand of LLE to perform sit to stand transfers, so educated on importance of weightbearing on LLE and added staggered sit to stand to HEP. Pt demonstrates very poor visual scanning ability in stance and was unable to locate several objects on the floor in clinic today despite cues. Encouraged pt to start practicing visual scanning at home, as he has not been doing this, to improve his safety in community. Continue POC.   OBJECTIVE IMPAIRMENTS: Abnormal gait, decreased activity tolerance, decreased balance, decreased  endurance, decreased mobility, difficulty walking, decreased ROM, decreased strength, increased edema, impaired flexibility, impaired sensation, impaired vision/preception, improper body mechanics, postural dysfunction, and prosthetic dependency .   ACTIVITY LIMITATIONS: carrying, lifting, bending, standing, squatting, stairs, transfers, and dressing  PARTICIPATION LIMITATIONS:  meal prep, cleaning, laundry, shopping, and community activity  PERSONAL FACTORS: Age, Time since onset of injury/illness/exacerbation, and 3+ comorbidities: diabetes mellitus, chronic kidney disease status post kidney transplant, movement of right eye complication of surgery, CAD, hypertension, are also affecting patient's functional outcome.   REHAB POTENTIAL: Good  CLINICAL DECISION MAKING: Stable/uncomplicated  EVALUATION COMPLEXITY: High   GOALS: Goals reviewed with patient? Yes  SHORT TERM GOALS: Target date: 03/30/24  Patient will be able to ambulate >500 feet with [place] cane and his prosthetic leg to improve short distance ambulation. Baseline: 230' Goal status: INITIAL  2.  Patient will demo compliance with HEP to start self managing his symptoms. Baseline: TBD Goal status: INITIAL   LONG TERM GOALS: Target date: 04/27/24  Patient will demo gait speed of 0.80 m/s with [place] cane to improve walking speed and decrease fall risk.  Baseline: 0.55 m/s with prosthetic leg and [place] cane. Goal status: INITIAL  2.  Patient will demo FGA of >15/30 to reduce fall risk.  Baseline: 8/30 (eval) Goal status: INITIAL  3.  Patient will be able to ambulate on grass for 200 feet with st point cane, Baseline: not attempted Goal status: INITIAL  4.  Patient will be able to perform visual scanning to find objects in a busy environment with 100% accuracy to improve ability to navigate through grocery store and find items.  Baseline: relies on sister/family to help with shopping. Goal status:  INITIAL     PLAN:  PT FREQUENCY: 2x/week  PT DURATION: 8 weeks  PLANNED INTERVENTIONS: [id number]- PT Re-evaluation, 97110-Therapeutic exercises, 97530- Therapeutic activity, [id number]- Neuromuscular re-education, 97535- Self Care, [id number]- Manual therapy, [phone]- Gait training, and Patient/Family education  PLAN FOR NEXT SESSION: Issue HEP, work on visual scanning, walking on different level surfaces. Weightbearing tolerance on LLE, eccentric quad control, obstacle navigation    Jasie Meleski E Cherlynn Popiel, PT, DPT 03/07/2024, 11:55 AM

## 2024-03-07 NOTE — Progress Notes (Addendum)
 This patient returns to my office for at risk foot care.  This patient requires this care by a professional since this patient will be at risk due to having diabetes and kidney disease. He has amputation left leg  BK.  This patient is unable to cut nails himself since the patient cannot reach his nails.These nails are painful walking and wearing shoes.  This patient presents for at risk foot care today.  General Appearance  Alert, conversant and in no acute stress.  Vascular  Dorsalis pedis and posterior tibial  pulses are palpable  right foot.  Capillary return is within normal limits  right foot. Temperature is within normal limits    Neurologic  Senn-Weinstein monofilament wire test within normal limits  right  Muscle power within normal limits right   Nails Thick disfigured discolored nails with subungual debris  from hallux to fifth toes right  No evidence of bacterial infection or drainage bilaterally.  Orthopedic  No limitations of motion  feet .  No crepitus or effusions noted.  No bony pathology or digital deformities noted. BK amputation.  Skin  normotropic skin with no porokeratosis noted .  No signs of infections or ulcers noted.     Onychomycosis  Pain in right toes  Pain in left toes  Consent was obtained for treatment procedures.   Mechanical debridement of nails 1-5  right performed with a nail nipper.  Filed with dremel without incident.    Return office visit    3 months                  Told patient to return for periodic foot care and evaluation due to potential at risk complications.   Cordella Bold DPM

## 2024-03-09 DIAGNOSIS — T8611 Kidney transplant rejection: Principal | ICD-10-CM

## 2024-03-09 NOTE — Telephone Encounter (Signed)
 I spoke with the patient (or family member) to notify them that their insurance will no longer be in-network as of 04/19/2024. I informed them of their option to switch to Original Medicare or a different Medicare Advantage plan that includes [facility] during the annual enrollment period. I provided them with contact information for Chapter who can assist them with finding another plan that keeps [facility] in network.    I also informed them that if they continue to receive post-transplant care under their existing plan after 04/19/2024 or another out of network plan, they will be required to pay out of pocket for any services and submit claims on their own behalf to their insurance company.    Patient not aware, given Chapter information.

## 2024-03-14 ENCOUNTER — Ambulatory Visit: Admitting: Physical Therapy

## 2024-03-14 VITALS — BP 172/85 | HR 63

## 2024-03-14 DIAGNOSIS — Z940 Kidney transplant status: Principal | ICD-10-CM

## 2024-03-14 DIAGNOSIS — R2689 Other abnormalities of gait and mobility: Secondary | ICD-10-CM | POA: Diagnosis not present

## 2024-03-14 DIAGNOSIS — R2681 Unsteadiness on feet: Secondary | ICD-10-CM | POA: Diagnosis not present

## 2024-03-14 DIAGNOSIS — M6281 Muscle weakness (generalized): Secondary | ICD-10-CM

## 2024-03-14 DIAGNOSIS — Z89512 Acquired absence of left leg below knee: Secondary | ICD-10-CM | POA: Diagnosis not present

## 2024-03-14 MED ORDER — TACROLIMUS 1 MG CAPSULE, IMMEDIATE-RELEASE
ORAL_CAPSULE | Freq: Two times a day (BID) | ORAL | 11 refills | 30.00000 days
Start: 2024-03-14 — End: 2025-03-14
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
## 2024-03-15 MED ORDER — TACROLIMUS 1 MG CAPSULE, IMMEDIATE-RELEASE
ORAL_CAPSULE | Freq: Two times a day (BID) | ORAL | 11 refills | 30.00000 days | Status: CP
Start: 2024-03-15 — End: 2025-03-15
  Filled 2024-03-26: 90d supply, fill #0

## 2024-03-19 DIAGNOSIS — N186 End stage renal disease: Principal | ICD-10-CM

## 2024-03-19 DIAGNOSIS — T8611 Kidney transplant rejection: Principal | ICD-10-CM

## 2024-03-19 DIAGNOSIS — Z940 Kidney transplant status: Principal | ICD-10-CM

## 2024-03-20 ENCOUNTER — Ambulatory Visit: Admitting: Physical Therapy

## 2024-03-20 DIAGNOSIS — G4733 Obstructive sleep apnea (adult) (pediatric): Secondary | ICD-10-CM | POA: Diagnosis not present

## 2024-03-20 MED ORDER — LANTUS SOLOSTAR U-100 INSULIN 100 UNIT/ML (3 ML) SUBCUTANEOUS PEN
Freq: Every evening | SUBCUTANEOUS | 5 refills | 93.00000 days | Status: CP
Start: 2024-03-20 — End: ?
  Filled 2024-03-26: qty 15, 94d supply, fill #0

## 2024-03-20 NOTE — Progress Notes (Signed)
 [facility] Refill Coordination Note    Specialty Medication(s) to be Shipped:   Transplant: mycophenolate  mofetil 180mg  and tacrolimus  1mg     Other medication(s) to be shipped: atorvastatin , Lantus , pen needles, metoprolol , omeprazole     Specialty Medications not needed at this time: N/A     Benjamin Maldonado, DOB: 07/02/1975  Phone: [phone] (home)       All above HIPAA information was verified with patient.     Was a nurse, learning disability used for this call? No    Completed refill call assessment today to schedule patient's medication shipment from the [facility]  [phone]).  All relevant notes have been reviewed.     Specialty medication(s) and dose(s) confirmed: Regimen is correct and unchanged.   Changes to medications: Benjamin Maldonado reports no changes at this time.  Changes to insurance: No  New side effects reported not previously addressed with a pharmacist or physician: None reported  Questions for the pharmacist: No    Confirmed patient received a Conservation Officer, Historic Buildings and a Surveyor, Mining with first shipment. The patient will receive a drug information handout for each medication shipped and additional FDA Medication Guides as required.       DISEASE/MEDICATION-SPECIFIC INFORMATION        N/A    SPECIALTY MEDICATION ADHERENCE     Medication Adherence    Patient reported X missed doses in the last month: 0  Specialty Medication: mycophenolate  180 MG EC tablet (MYFORTIC )  Patient is on additional specialty medications: Yes  Additional Specialty Medications: tacrolimus  1 MG capsule (PROGRAF )  Patient Reported Additional Medication X Missed Doses in the Last Month: 0  Patient is on more than two specialty medications: No  Any gaps in refill history greater than 2 weeks in the last 3 months: no  Demonstrates understanding of importance of adherence: yes  Informant: patient  Adherence tools used: patient uses a pill box to manage medications  Support network for adherence: family member  Confirmed plan for next specialty medication refill: delivery by pharmacy  Refills needed for supportive medications: not needed          Refill Coordination    Has the Patients' Contact Information Changed: No  Is the Shipping Address Different: No         Were doses missed due to medication being on hold? No    tacrolimus  1  mg: 14 days of medicine on hand   mycophenolate  180  mg: 14 days of medicine on hand       REFERRAL TO PHARMACIST     Referral to the pharmacist: Not needed      SHIPPING     Shipping address confirmed in Epic.     Cost and Payment: Patient has a $0 copay, payment information is not required.    Delivery Scheduled: Yes, Expected medication delivery date: 03/27/24.     Medication will be delivered via UPS to the prescription address in Epic WAM.    Suzen Blood   [facility]  Specialty Technician

## 2024-03-21 ENCOUNTER — Ambulatory Visit: Admitting: Physical Therapy

## 2024-03-21 DIAGNOSIS — M6281 Muscle weakness (generalized): Secondary | ICD-10-CM | POA: Insufficient documentation

## 2024-03-21 DIAGNOSIS — R2689 Other abnormalities of gait and mobility: Secondary | ICD-10-CM | POA: Insufficient documentation

## 2024-03-21 DIAGNOSIS — R2681 Unsteadiness on feet: Secondary | ICD-10-CM | POA: Insufficient documentation

## 2024-03-23 ENCOUNTER — Ambulatory Visit: Admitting: Physical Therapy

## 2024-03-23 VITALS — BP 147/73 | HR 68

## 2024-03-23 DIAGNOSIS — R2681 Unsteadiness on feet: Secondary | ICD-10-CM | POA: Diagnosis present

## 2024-03-23 DIAGNOSIS — M6281 Muscle weakness (generalized): Secondary | ICD-10-CM

## 2024-03-23 DIAGNOSIS — R2689 Other abnormalities of gait and mobility: Secondary | ICD-10-CM

## 2024-03-23 NOTE — Therapy (Signed)
 OUTPATIENT PHYSICAL THERAPY PROSTHETICS TREATMENT   Patient Name: Anthony Blanchard MRN: [id number] DOB:Nov 12, 1975, 48 y.o., male Today's Date: 03/23/2024  PCP: Dr. Clotilda Single REFERRING PROVIDER: Single Clotilda SAUNDERS, MD  END OF SESSION:  PT End of Session - 03/23/24 1322     Visit Number 4    Number of Visits 13    Date for Recertification  04/27/24    Authorization Type [employer]    Authorization Time Period 03/02/24 - 05/31/24 10 PT visits    PT Start Time 1321   Pt arrived late   PT Stop Time 1401    PT Time Calculation (min) 40 min    Equipment Utilized During Treatment Gait belt    Activity Tolerance Patient tolerated treatment well    Behavior During Therapy [facility] for tasks assessed/performed            Past Medical History:  Diagnosis Date   Abnormal results of thyroid  function studies 08/16/2011   Chronic kidney disease    Coronary artery disease    Diabetes mellitus without complication (HCC)    Diabetic foot ulcers (HCC) 10/2019   GERD (gastroesophageal reflux disease)    Glaucoma    Hypertension    Overweight    Partial blindness    Past Surgical History:  Procedure Laterality Date   BONE BIOPSY Bilateral 11/03/2019   Procedure: BONE BIOPSY;  Surgeon: Burt Fus, DPM;  Location: [facility] OR;  Service: Podiatry;  Laterality: Bilateral;   CARDIAC CATHETERIZATION     EYE SURGERY     GRAFT APPLICATION Bilateral 11/03/2019   Procedure: GRAFT APPLICATION;  Surgeon: Burt Fus, DPM;  Location: [facility] OR;  Service: Podiatry;  Laterality: Bilateral;  Integra bilayer 8x10   INCISION AND DRAINAGE OF WOUND Bilateral 11/03/2019   Procedure: IRRIGATION AND DEBRIDEMENT WOUND;  Surgeon: Burt Fus, DPM;  Location: [facility] OR;  Service: Podiatry;  Laterality: Bilateral;   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   Patient Active Problem List   Diagnosis Date Noted   DKA (diabetic ketoacidosis) (HCC) 10/28/2019   Sepsis (HCC) 10/28/2019   Essential hypertension 10/28/2019    Hyperkalemia 10/28/2019   CKD (chronic kidney disease) stage 4, GFR 15-29 ml/min (HCC) 10/28/2019   Diabetic foot ulcer (HCC) 10/28/2019   DKA (diabetic ketoacidosis) (HCC) 10/28/2019   Hyperlipidemia 02/22/2019   Trigger middle finger of left hand 12/18/2018   CAD (coronary artery disease) 08/29/2017   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   Aftercare following organ transplant 12/12/2014   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   Immunosuppressed status 12/12/2014   LVH (left ventricular hypertrophy) due to hypertensive disease 12/12/2014   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/10/2014   Diabetes mellitus (HCC) 01/05/2011   Hypertension, benign 12/25/2009    ONSET DATE: 02/24/2024- date of referral  REFERRING DIAG: Z89.512 (ICD-10-CM) - Left below-knee amputee (HCC)  THERAPY DIAG:  Other abnormalities of gait and mobility  Muscle weakness (generalized)  Unsteadiness on feet  Rationale for Evaluation and Treatment: Rehabilitation  SUBJECTIVE:   SUBJECTIVE STATEMENT: Pt presents w/SPC, required min A to safely navigate through clinic. Reports a few near misses since last session due to his scooter being broken, but was able to catch himself to prevent a fall. No pain today. HEP is still challenging.   Pt accompanied by: self  PERTINENT HISTORY:  diabetes mellitus, chronic kidney disease status post kidney transplant, movement of right eye complication of surgery, CAD, hypertension  PAIN:  Are you having pain? No  PRECAUTIONS: Fall, blind in R eye, severely impaired  vision in L eye   RED FLAGS: None   WEIGHT BEARING RESTRICTIONS: No  FALLS: Has patient fallen in last 6 months? No (last fall >2 years ago)  LIVING ENVIRONMENT: Lives with: lives alone Lives in: House/apartment Home Access: Stairs to enter Home layout: One level Stairs: Yes: 8 steps to enter the apartment, and can reach both rails Has following equipment at home: Single point cane and knee scooter .Uses knee scooter to go to  the bathroom   PLOF: Requires assistive device for independence and Needs assistance with homemaking  PATIENT GOALS: Want to be able to go on the incline and overall balance, walking on different terrain.  OBJECTIVE:  Note: Objective measures were completed at Evaluation unless otherwise noted.    COGNITION: Overall cognitive status: Within functional limits for tasks assessed    FUNCTIONAL TESTS:  10 meter walk test: 0.55 m/s (<0.50 indicates high fall risk, 0.50 to 0.80 m/s is household ambulator) Functional gait assessment:  FUNCTIONAL GAIT ASSESSMENT  Date: 03/02/24 Score  GAIT LEVEL SURFACE Instructions: Walk at your normal speed from here to the next mark (6 m) [20 ft]. (1) Moderate impairment-Walks 6 m (20 ft), slow speed, abnormal gait pattern, evidence for imbalance, or deviates 25.4 - 38.1 cm (10 -15 in) outside of the 30.48-cm (12-in) walkway width. Requires more than 7 seconds to ambulate 6 m (20 ft).  2.   CHANGE IN GAIT SPEED Instructions: Begin walking at your normal pace (for 1.5 m [5 ft]). When I tell you "go," walk as fast as you can (for 1.5 m [5 ft]). When I tell you "slow," walk as slowly as you can (for 1.5 m [5 ft]. (0) Severe impairment - Cannot change speeds, deviates greater than 38.1 cm (15 in) outside 30.48-cm (12-in) walkway width, or loses balance and has to reach for wall or be caught.  3.    GAIT WITH HORIZONTAL HEAD TURNS Instructions: Walk from here to the next mark 6 m (20 ft) away. Begin walking at your normal pace. Keep walking straight; after 3 steps, turn your head to the right and keep walking straight while looking to the right. After 3 more steps, turn your head to the left and keep walking straight while looking left. Continue alternating looking right and left. (0) Severe impairment-Performs task with severe disruption of gait (eg, staggers 38.1 cm [15 in] outside 30.48-cm (12-in) walkway width, loses balance, stops, or reaches for wall).  4.    GAIT WITH VERTICAL HEAD TURNS Instructions: Walk from here to the next mark (6 m [20 ft]). Begin walking at your normal pace. Keep walking straight; after 3 steps, tip your head up and keep walking straight while looking up. After 3 more steps, tip your head down, keep walking straight while looking down. Continue  alternating looking up and down every 3 steps until you have completed 2 repetitions in each direction. (0) Severe impairment - Performs task with severe disruption of gait (eg, staggers 38.1 cm [15 in] outside 30.48-cm (12-in) walkway width, loses balance, stops, reaches for wall).  5.  GAIT AND PIVOT TURN Instructions: Begin with walking at your normal pace. When I tell you, "turn and stop," turn as quickly as you can to face the opposite direction and stop. (1) Moderate impairment - Turns slowly, requires verbal cueing, or requires several small steps to catch balance following turn and stop  6.   STEP OVER OBSTACLE Instructions: Begin walking at your normal speed. When you come to  the shoe box, step over it, not around it, and keep walking. (1) Moderate impairment - Is able to step over one shoe box (11.43 cm [4.5 in] total height) but must slow down and adjust steps to clear box safely. May require verbal cueing.  7.   GAIT WITH NARROW BASE OF SUPPORT Instructions: Walk on the floor with arms folded across the chest, feet aligned heel to toe in tandem for a distance of 3.6 m [12 ft]. The number of steps taken in a straight line are counted for a maximum of 10 steps. (0) Severe impairment - Ambulates less than 4 steps heel to toe or cannot perform without assistance.  8.   GAIT WITH EYES CLOSED Instructions: Walk at your normal speed from here to the next mark (6 m [20 ft]) with your eyes closed. (2) Mild impairment - Walks 6 m (20 ft), uses assistive device, slower speed, mild gait deviations, deviates 15.24 -25.4 cm (6 -10 in) outside 30.48-cm (12-in) walkway width. Ambulates 6 m (20 ft) in  less than 9 seconds but greater than 7 seconds  9.   AMBULATING BACKWARDS Instructions: Walk backwards until I tell you to stop (2) Mild impairment - Walks 6 m (20 ft), uses assistive device, slower speed, mild gait deviations, deviates 15.24 -25.4 cm (6 -10 in) outside 30.48-cm (12-in) walkway width  10. STEPS Instructions: Walk up these stairs as you would at home (ie, using the rail if necessary). At the top turn around and walk down. (1) Moderate impairment-Two feet to a stair; must use rail.  Total 8/30   Interpretation of scores: Non-Specific Older Adults Cutoff Score: <=22/30 = risk of falls Parkinson's Disease Cutoff score <15/30= fall risk (Hoehn & Yahr 1-4)  Minimally Clinically Important Difference (MCID)  Stroke (acute, subacute, and chronic) = MDC: 4.2 points Vestibular (acute) = MDC: 6 points Community Dwelling Older Adults =  MCID: 4 points Parkinson's Disease  =  MDC: 4.3 points  (Academy of Neu[employer]ic Physical Therapy (nd). Functional Gait Assessment. Retrieved from https://www.neuropt.org/docs/default-source/cpgs/core-outcome-measures/function-gait-assessment-pocket-guide-proof9-(2).pdf?sfvrsn=b33f35043_0.)  CCURRENT PROSTHETIC WEAR ASSESSMENT: Patient is independent with: skin check, residual limb care, care of non-amputated limb, prosthetic cleaning, ply sock cleaning, proper wear schedule/adjustment, and proper weight-bearing schedule/adjustment Patient is dependent with: correct ply sock adjustment Donning prosthesis: Complete Independence Doffing prosthesis: Complete Independence Prosthetic wear tolerance: 8+ hours/day, 7 days/week Prosthetic weight bearing tolerance: 30-45 minutes Edema: minimal  Peripheral vision patient has about 5 deg of peripheral vision on left side and about 20 deg to his R side. Pt is only able to clearly see things when they are directly in fron of him. Pt educated on his current peripheral vision and strategies with head turn to ensure  safety with obstacle negotiation, environmental scanning and different levels of walking surfaces.   VITALS  Vitals:   03/23/24 1325 03/23/24 1356  BP: (!) 170/89 (!) 147/73 Comment: End of session  Pulse: 73 68  TREATMENT DATE:   Self-care/home management/NMR Assessed vitals in LUE (see above) and BP systolic BP continues to be elevated but narrowly within limits for session. Pt reports he took his prescribed medications this AM. Pt has not contacted PCP about BP cuff for home but reports he will call.  Pt reported feeling very unstable this AM, falling to L side. Inquired about use of socks w/prosthetic and pt reports he has not been wearing socks. Assessed LLE and therapist able to rotate prosthetic 360 degrees on residual limb due to need for socks. Pt did not bring socks to session, so therapist provided blue (3 ply) and green (5 ply) sock to wear during session. Informed pt he needs to be adding socks during the day and to bring socks to next session. Pt verbalized understanding.  In // bars for improved midline orientation, facilitation of ankle strategy and BLE strength:  On rocker board in A/P direction:  Standing w/EO and intermittent UE support w/CGA-min A for steadying assist. Pt frequently losing balance posteriorly w/no righting reactions noted, so provided mod verbal cues to facilitate ankle strategy and pt able to maintain balance for 5-10s at a time prior to needing to correct balance w/BUEs.  On rocker board in L/R direction:  Standing w/EO and intermittent UE support w/CGA for steadying assist. Pt unable to maintain midline orientation, as he repeatedly lost balance to L side. Informed pt that his need for socks on L side are likely contributing to his instability.      Gait pattern: step through pattern and lateral hip instability Distance  walked: Various clinic distances  Assistive device utilized: Single point cane Level of assistance: Min A Comments: Pt requires min A to safely navigate clinic due to impaired vision, decreased visual scanning and improper fit of prosthetic on leg.       PATIENT EDUCATION: Education details: continue HEP, continued encouragement to contact PCP regarding BP cuff, importance of wearing socks  Person educated: Patient Education method: Explanation, Demonstration, and Verbal cues Education comprehension: verbalized understanding, returned demonstration, and needs further education  HOME EXERCISE PROGRAM: Access Code: [facility] URL: [email or web address] Date: 03/07/2024 Prepared by: Marlon Dekota Shenk  Exercises - Staggered Sit-to-Stand  - 1 x daily - 7 x weekly - 1-2 sets - 8-10 reps  ASSESSMENT:  CLINICAL IMPRESSION: Emphasis of skilled PT session on educating pt on sock management, assessing vitals and midline orientation. Pt reported feeling unsteady this AM and noted L prosthetic being very loose on L residual limb due to lack of socks. Informed pt this could be contributing to his instability. Provided pt w/8ply socks to wear during session and pt reported feeling more stable w/use of socks. Recommended pt bring socks to every PT session and continued to encourage he contact PCP to request BP cuff. Continue POC.   OBJECTIVE IMPAIRMENTS: Abnormal gait, decreased activity tolerance, decreased balance, decreased endurance, decreased mobility, difficulty walking, decreased ROM, decreased strength, increased edema, impaired flexibility, impaired sensation, impaired vision/preception, improper body mechanics, postural dysfunction, and prosthetic dependency .   ACTIVITY LIMITATIONS: carrying, lifting, bending, standing, squatting, stairs, transfers, and dressing  PARTICIPATION LIMITATIONS: meal prep, cleaning, laundry, shopping, and community activity  PERSONAL FACTORS: Age,  Time since onset of injury/illness/exacerbation, and 3+ comorbidities: diabetes mellitus, chronic kidney disease status post kidney transplant, movement of right eye complication of surgery, CAD, hypertension, are also affecting patient's functional outcome.   REHAB POTENTIAL: Good  CLINICAL DECISION MAKING: Stable/uncomplicated  EVALUATION COMPLEXITY: High   GOALS: Goals reviewed with  patient? Yes  SHORT TERM GOALS: Target date: 03/30/24  Patient will be able to ambulate >500 feet with [place] cane and his prosthetic leg to improve short distance ambulation. Baseline: 230' Goal status: INITIAL  2.  Patient will demo compliance with HEP to start self managing his symptoms. Baseline: TBD Goal status: INITIAL   LONG TERM GOALS: Target date: 04/27/24  Patient will demo gait speed of 0.80 m/s with [place] cane to improve walking speed and decrease fall risk.  Baseline: 0.55 m/s with prosthetic leg and [place] cane. Goal status: INITIAL  2.  Patient will demo FGA of >15/30 to reduce fall risk.  Baseline: 8/30 (eval) Goal status: INITIAL  3.  Patient will be able to ambulate on grass for 200 feet with st point cane, Baseline: not attempted Goal status: INITIAL  4.  Patient will be able to perform visual scanning to find objects in a busy environment with 100% accuracy to improve ability to navigate through grocery store and find items.  Baseline: relies on sister/family to help with shopping. Goal status: INITIAL     PLAN:  PT FREQUENCY: 2x/week  PT DURATION: 8 weeks  PLANNED INTERVENTIONS: [id number]- PT Re-evaluation, [id number]-Therapeutic exercises, 97530- Therapeutic activity, [id number]- Neuromuscular re-education, 97535- Self Care, [id number]- Manual therapy, [phone]- Gait training, and Patient/Family education  PLAN FOR NEXT SESSION: Did he contact PCP? Did he bring socks?  Issue HEP, work on visual scanning, walking on different level surfaces. Weightbearing tolerance on LLE,  eccentric quad control, obstacle navigation    Sydnie Sigmund E Ryan Palermo, PT, DPT 03/23/2024, 2:23 PM

## 2024-03-26 MED FILL — DROPLET PEN NEEDLE 32 GAUGE X 5/32" (4 MM): ORAL | 40 days supply | Qty: 100 | Fill #0

## 2024-03-26 MED FILL — METOPROLOL TARTRATE 50 MG TABLET: ORAL | 90 days supply | Qty: 270 | Fill #3

## 2024-03-26 MED FILL — OMEPRAZOLE 20 MG CAPSULE,DELAYED RELEASE: ORAL | 90 days supply | Qty: 90 | Fill #2

## 2024-03-26 MED FILL — MYCOPHENOLATE SODIUM 180 MG TABLET,DELAYED RELEASE: ORAL | 90 days supply | Qty: 540 | Fill #5

## 2024-03-26 MED FILL — ATORVASTATIN 20 MG TABLET: ORAL | 90 days supply | Qty: 90 | Fill #2

## 2024-03-28 ENCOUNTER — Other Ambulatory Visit: Payer: Self-pay | Admitting: "Endocrinology

## 2024-03-28 ENCOUNTER — Ambulatory Visit: Admitting: Physical Therapy

## 2024-03-28 VITALS — BP 142/70 | HR 74

## 2024-03-28 DIAGNOSIS — R2689 Other abnormalities of gait and mobility: Secondary | ICD-10-CM

## 2024-03-28 DIAGNOSIS — M6281 Muscle weakness (generalized): Secondary | ICD-10-CM

## 2024-03-28 DIAGNOSIS — R2681 Unsteadiness on feet: Secondary | ICD-10-CM

## 2024-03-28 NOTE — Therapy (Signed)
 OUTPATIENT PHYSICAL THERAPY PROSTHETICS TREATMENT   Patient Name: Anthony Blanchard MRN: [id number] DOB:[birth year], 48 y.o., male Today's Date: 03/28/2024  PCP: Dr. Clotilda Single REFERRING PROVIDER: Single Clotilda SAUNDERS, MD  END OF SESSION:  PT End of Session - 03/28/24 1452     Visit Number 5    Number of Visits 13    Date for Recertification  04/27/24    Authorization Type [employer]    Authorization Time Period 03/02/24 - 05/31/24 10 PT visits    PT Start Time 1450    PT Stop Time 1533    PT Time Calculation (min) 43 min    Equipment Utilized During Treatment Gait belt    Activity Tolerance Patient tolerated treatment well    Behavior During Therapy [facility] for tasks assessed/performed             Past Medical History:  Diagnosis Date   Abnormal results of thyroid  function studies 08/16/2011   Chronic kidney disease    Coronary artery disease    Diabetes mellitus without complication (HCC)    Diabetic foot ulcers (HCC) 10/2019   GERD (gastroesophageal reflux disease)    Glaucoma    Hypertension    Overweight    Partial blindness    Past Surgical History:  Procedure Laterality Date   BONE BIOPSY Bilateral 11/03/2019   Procedure: BONE BIOPSY;  Surgeon: Burt Fus, DPM;  Location: [facility] OR;  Service: Podiatry;  Laterality: Bilateral;   CARDIAC CATHETERIZATION     EYE SURGERY     GRAFT APPLICATION Bilateral 11/03/2019   Procedure: GRAFT APPLICATION;  Surgeon: Burt Fus, DPM;  Location: [facility] OR;  Service: Podiatry;  Laterality: Bilateral;  Integra bilayer 8x10   INCISION AND DRAINAGE OF WOUND Bilateral 11/03/2019   Procedure: IRRIGATION AND DEBRIDEMENT WOUND;  Surgeon: Burt Fus, DPM;  Location: [facility] OR;  Service: Podiatry;  Laterality: Bilateral;   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   Patient Active Problem List   Diagnosis Date Noted   DKA (diabetic ketoacidosis) (HCC) 10/28/2019   Sepsis (HCC) 10/28/2019   Essential hypertension 10/28/2019   Hyperkalemia  10/28/2019   CKD (chronic kidney disease) stage 4, GFR 15-29 ml/min (HCC) 10/28/2019   Diabetic foot ulcer (HCC) 10/28/2019   DKA (diabetic ketoacidosis) (HCC) 10/28/2019   Hyperlipidemia 02/22/2019   Trigger middle finger of left hand 12/18/2018   CAD (coronary artery disease) 08/29/2017   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   Aftercare following organ transplant 12/12/2014   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   Immunosuppressed status 12/12/2014   LVH (left ventricular hypertrophy) due to hypertensive disease 12/12/2014   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/10/2014   Diabetes mellitus (HCC) 01/05/2011   Hypertension, benign 12/25/2009    ONSET DATE: 02/24/2024- date of referral  REFERRING DIAG: Z89.512 (ICD-10-CM) - Left below-knee amputee (HCC)  THERAPY DIAG:  Other abnormalities of gait and mobility  Muscle weakness (generalized)  Unsteadiness on feet  Rationale for Evaluation and Treatment: Rehabilitation  SUBJECTIVE:   SUBJECTIVE STATEMENT: Pt presents w/SPC, required min A to safely navigate through clinic. Denies falls or acute changes since last session. Is wearing two socks today, unsure which ply. Did call his PCP about a BP cuff, is waiting on a call back.   Pt accompanied by: self  PERTINENT HISTORY:  diabetes mellitus, chronic kidney disease status post kidney transplant, movement of right eye complication of surgery, CAD, hypertension  PAIN:  Are you having pain? No  PRECAUTIONS: Fall, blind in R eye, severely impaired vision in L eye  RED FLAGS: None   WEIGHT BEARING RESTRICTIONS: No  FALLS: Has patient fallen in last 6 months? No (last fall >2 years ago)  LIVING ENVIRONMENT: Lives with: lives alone Lives in: House/apartment Home Access: Stairs to enter Home layout: One level Stairs: Yes: 8 steps to enter the apartment, and can reach both rails Has following equipment at home: Single point cane and knee scooter .Uses knee scooter to go to the bathroom   PLOF:  Requires assistive device for independence and Needs assistance with homemaking  PATIENT GOALS: Want to be able to go on the incline and overall balance, walking on different terrain.  OBJECTIVE:  Note: Objective measures were completed at Evaluation unless otherwise noted.    COGNITION: Overall cognitive status: Within functional limits for tasks assessed    FUNCTIONAL TESTS:  10 meter walk test: 0.55 m/s (<0.50 indicates high fall risk, 0.50 to 0.80 m/s is household ambulator) Functional gait assessment:  FUNCTIONAL GAIT ASSESSMENT  Date: 03/02/24 Score  GAIT LEVEL SURFACE Instructions: Walk at your normal speed from here to the next mark (6 m) [20 ft]. (1) Moderate impairment-Walks 6 m (20 ft), slow speed, abnormal gait pattern, evidence for imbalance, or deviates 25.4 - 38.1 cm (10 -15 in) outside of the 30.48-cm (12-in) walkway width. Requires more than 7 seconds to ambulate 6 m (20 ft).  2.   CHANGE IN GAIT SPEED Instructions: Begin walking at your normal pace (for 1.5 m [5 ft]). When I tell you go, walk as fast as you can (for 1.5 m [5 ft]). When I tell you slow, walk as slowly as you can (for 1.5 m [5 ft]. (0) Severe impairment - Cannot change speeds, deviates greater than 38.1 cm (15 in) outside 30.48-cm (12-in) walkway width, or loses balance and has to reach for wall or be caught.  3.    GAIT WITH HORIZONTAL HEAD TURNS Instructions: Walk from here to the next mark 6 m (20 ft) away. Begin walking at your normal pace. Keep walking straight; after 3 steps, turn your head to the right and keep walking straight while looking to the right. After 3 more steps, turn your head to the left and keep walking straight while looking left. Continue alternating looking right and left. (0) Severe impairment-Performs task with severe disruption of gait (eg, staggers 38.1 cm [15 in] outside 30.48-cm (12-in) walkway width, loses balance, stops, or reaches for wall).  4.   GAIT WITH VERTICAL HEAD  TURNS Instructions: Walk from here to the next mark (6 m [20 ft]). Begin walking at your normal pace. Keep walking straight; after 3 steps, tip your head up and keep walking straight while looking up. After 3 more steps, tip your head down, keep walking straight while looking down. Continue  alternating looking up and down every 3 steps until you have completed 2 repetitions in each direction. (0) Severe impairment - Performs task with severe disruption of gait (eg, staggers 38.1 cm [15 in] outside 30.48-cm (12-in) walkway width, loses balance, stops, reaches for wall).  5.  GAIT AND PIVOT TURN Instructions: Begin with walking at your normal pace. When I tell you, turn and stop, turn as quickly as you can to face the opposite direction and stop. (1) Moderate impairment - Turns slowly, requires verbal cueing, or requires several small steps to catch balance following turn and stop  6.   STEP OVER OBSTACLE Instructions: Begin walking at your normal speed. When you come to the shoe box, step over it,  not around it, and keep walking. (1) Moderate impairment - Is able to step over one shoe box (11.43 cm [4.5 in] total height) but must slow down and adjust steps to clear box safely. May require verbal cueing.  7.   GAIT WITH NARROW BASE OF SUPPORT Instructions: Walk on the floor with arms folded across the chest, feet aligned heel to toe in tandem for a distance of 3.6 m [12 ft]. The number of steps taken in a straight line are counted for a maximum of 10 steps. (0) Severe impairment - Ambulates less than 4 steps heel to toe or cannot perform without assistance.  8.   GAIT WITH EYES CLOSED Instructions: Walk at your normal speed from here to the next mark (6 m [20 ft]) with your eyes closed. (2) Mild impairment - Walks 6 m (20 ft), uses assistive device, slower speed, mild gait deviations, deviates 15.24 -25.4 cm (6 -10 in) outside 30.48-cm (12-in) walkway width. Ambulates 6 m (20 ft) in less than 9 seconds but  greater than 7 seconds  9.   AMBULATING BACKWARDS Instructions: Walk backwards until I tell you to stop (2) Mild impairment - Walks 6 m (20 ft), uses assistive device, slower speed, mild gait deviations, deviates 15.24 -25.4 cm (6 -10 in) outside 30.48-cm (12-in) walkway width  10. STEPS Instructions: Walk up these stairs as you would at home (ie, using the rail if necessary). At the top turn around and walk down. (1) Moderate impairment-Two feet to a stair; must use rail.  Total 8/30   Interpretation of scores: Non-Specific Older Adults Cutoff Score: <=22/30 = risk of falls Parkinsons Disease Cutoff score <15/30= fall risk (Hoehn & Yahr 1-4)  Minimally Clinically Important Difference (MCID)  Stroke (acute, subacute, and chronic) = MDC: 4.2 points Vestibular (acute) = MDC: 6 points Community Dwelling Older Adults =  MCID: 4 points Parkinsons Disease  =  MDC: 4.3 points  (Academy of Neurologic Physical Therapy (nd). Functional Gait Assessment. Retrieved from https://www.neuropt.org/docs/default-source/cpgs/core-outcome-measures/function-gait-assessment-pocket-guide-proof9-(2).pdf?sfvrsn=b32f35043_0.)  CCURRENT PROSTHETIC WEAR ASSESSMENT: Patient is independent with: skin check, residual limb care, care of non-amputated limb, prosthetic cleaning, ply sock cleaning, proper wear schedule/adjustment, and proper weight-bearing schedule/adjustment Patient is dependent with: correct ply sock adjustment Donning prosthesis: Complete Independence Doffing prosthesis: Complete Independence Prosthetic wear tolerance: 8+ hours/day, 7 days/week Prosthetic weight bearing tolerance: 30-45 minutes Edema: minimal  Peripheral vision patient has about 5 deg of peripheral vision on left side and about 20 deg to his R side. Pt is only able to clearly see things when they are directly in fron of him. Pt educated on his current peripheral vision and strategies with head turn to ensure safety with obstacle  negotiation, environmental scanning and different levels of walking surfaces.   VITALS  Vitals:   03/28/24 1454  BP: (!) 142/70  Pulse: 74  TREATMENT DATE:   Self-care/home management/Ther Act/NMR  Assessed vitals in LUE (see above) and BP WNL.  SciFit multi-peaks level 5.0 for 8 minutes using BUE/BLEs for neural priming for reciprocal movement, dynamic cardiovascular warmup and increased amplitude of stepping. RPE of 1/10 following activity.   6 Blaze pods at ballet bar for improved visual scanning, LE coordination, single leg stability and reaching out of BOS. Pt requires CGA guarding and BUE support. Round 1:  w/4 pods set up in horizontal line on floor and 2 pods placed superolaterally to L and R side on mirror. Placed pods on random reach setting w/hit duration (60 hits) rather than time duration.  60 hits in 20s. Round 2:  same pod setup but attempted to focus on one color.  However, pt unable to differentiate between colors, so DC round.  Notable errors/deficits:  decreased visual scanning to L side.  At ballet bar, placed Squigz on mirror and had pt perform semi-tandem stance parallel to bar and rotate to retrieve squigz off mirror, x8 Squigz per side, for improved visual scanning, reaching out of BOS and stability w/narrow BOS. Pt more challenged when rotating to R side w/L foot forward, requiring min A to stabilize. Pt also required mod verbal cues to properly scan to find squigz, especially in superior field.    Gait pattern: step through pattern and lateral hip instability Distance walked: Various clinic distances  Assistive device utilized: Single point cane Level of assistance: Min A Comments: Pt requires min A to safely navigate clinic due to impaired vision, decreased visual scanning and improper fit of prosthetic on leg.       PATIENT  EDUCATION: Education details: continue HEP, continue wearing socks  Person educated: Patient Education method: Explanation Education comprehension: verbalized understanding and needs further education  HOME EXERCISE PROGRAM: Access Code: [facility] URL: [email or web address] Date: 03/07/2024 Prepared by: Marlon Elie Leppo  Exercises - Staggered Sit-to-Stand  - 1 x daily - 7 x weekly - 1-2 sets - 8-10 reps  ASSESSMENT:  CLINICAL IMPRESSION: Emphasis of skilled PT session on improved visual scanning, stability w/reaching out of BOS and single leg stability. Pt did wear socks today and noted improved stability w/gait. Encouraged pt continue wearing these at home and to clinic. Pt has improved slightly w/visual scanning but required mod verbal cues to scan superior field, often missing objects despite therapist's cues. Pt's mobility greatly limited by vision deficits and poor prosthetic management but will continue to address impairments in PT. Continue POC.   OBJECTIVE IMPAIRMENTS: Abnormal gait, decreased activity tolerance, decreased balance, decreased endurance, decreased mobility, difficulty walking, decreased ROM, decreased strength, increased edema, impaired flexibility, impaired sensation, impaired vision/preception, improper body mechanics, postural dysfunction, and prosthetic dependency .   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   REHAB POTENTIAL: Good  CLINICAL DECISION MAKING: Stable/uncomplicated  EVALUATION COMPLEXITY:  High   GOALS: Goals reviewed with patient? Yes  SHORT TERM GOALS: Target date: 03/30/24  Patient will be able to ambulate >500 feet with [place] cane and his prosthetic leg to improve short distance ambulation. Baseline: 230' Goal status: INITIAL  2.  Patient will demo compliance with HEP to start self managing his symptoms. Baseline: TBD  Goal status: INITIAL   LONG TERM GOALS: Target date: 04/27/24  Patient will demo gait speed of 0.80 m/s with [place] cane to improve walking speed and decrease fall risk.  Baseline: 0.55 m/s with prosthetic leg and [place] cane. Goal status: INITIAL  2.  Patient will demo FGA of >15/30 to reduce fall risk.  Baseline: 8/30 (eval) Goal status: INITIAL  3.  Patient will be able to ambulate on grass for 200 feet with st point cane, Baseline: not attempted Goal status: INITIAL  4.  Patient will be able to perform visual scanning to find objects in a busy environment with 100% accuracy to improve ability to navigate through grocery store and find items.  Baseline: relies on sister/family to help with shopping. Goal status: INITIAL     PLAN:  PT FREQUENCY: 2x/week  PT DURATION: 8 weeks  PLANNED INTERVENTIONS: [id number]- PT Re-evaluation, 97110-Therapeutic exercises, 97530- Therapeutic activity, [id number]- Neuromuscular re-education, 97535- Self Care, [id number]- Manual therapy, [phone]- Gait training, and Patient/Family education  PLAN FOR NEXT SESSION: Did he contact PCP? Did he bring socks?  Issue HEP, work on visual scanning, walking on different level surfaces. Weightbearing tolerance on LLE, eccentric quad control, obstacle navigation, step ups    Majed Pellegrin E Ellanor Feuerstein, PT, DPT 03/28/2024, 4:02 PM

## 2024-03-29 NOTE — Telephone Encounter (Signed)
 Requested Prescriptions   Pending Prescriptions Disp Refills   Continuous Glucose Sensor (DEXCOM G7 SENSOR) MISC [Pharmacy Med Name: DEXCOM G7 SENSOR] 9 each 0    Sig: USE AS DIRECTED, CHANGE SENSOR EVERY 10 DAYS

## 2024-03-30 ENCOUNTER — Ambulatory Visit: Admitting: Physical Therapy

## 2024-03-30 ENCOUNTER — Telehealth: Payer: Self-pay | Admitting: Physical Therapy

## 2024-03-30 NOTE — Telephone Encounter (Signed)
 Called and spoke to pt regarding no-show to scheduled PT appointment. Pt reports he thought his appointment was at a later time today. Pt confirmed next appointment date and time.   Raenell Mensing E Marshall Kampf, PT, DPT

## 2024-04-03 ENCOUNTER — Ambulatory Visit: Admitting: Physical Therapy

## 2024-04-04 ENCOUNTER — Ambulatory Visit: Admitting: Physical Therapy

## 2024-04-04 ENCOUNTER — Telehealth: Payer: Self-pay | Admitting: Physical Therapy

## 2024-04-04 VITALS — BP 150/77 | HR 71

## 2024-04-04 DIAGNOSIS — M6281 Muscle weakness (generalized): Secondary | ICD-10-CM

## 2024-04-04 DIAGNOSIS — R2681 Unsteadiness on feet: Secondary | ICD-10-CM

## 2024-04-04 DIAGNOSIS — R2689 Other abnormalities of gait and mobility: Secondary | ICD-10-CM | POA: Diagnosis not present

## 2024-04-04 NOTE — Telephone Encounter (Signed)
 Dr. Mercer,  Anthony Blanchard  is being seen by PT for imbalance. The patient would benefit from a low vision OT evaluation to provide low vision resources and improve independence.    If you agree, please place an order in [facility] workque in [facility] or fax the order to [phone].  Thank you,  [name], PT, DPT  [facility] [address] [place], [place]  [address] Phone:  [phone] Fax:  [phone]

## 2024-04-04 NOTE — Therapy (Signed)
 OUTPATIENT PHYSICAL THERAPY PROSTHETICS TREATMENT   Patient Name: Anthony Blanchard MRN: [id number] DOB:1975-12-04, 48 y.o., male Today's Date: 04/04/2024  PCP: Dr. Clotilda Single REFERRING PROVIDER: Single Clotilda SAUNDERS, MD  END OF SESSION:  PT End of Session - 04/04/24 1502     Visit Number 6    Number of Visits 13    Date for Recertification  04/27/24    Authorization Type [employer]    Authorization Time Period 03/02/24 - 05/31/24 10 PT visits    PT Start Time 1457   Pt arrived late   PT Stop Time 1538    PT Time Calculation (min) 41 min    Equipment Utilized During Treatment Gait belt    Activity Tolerance Patient tolerated treatment well    Behavior During Therapy [facility] for tasks assessed/performed             Past Medical History:  Diagnosis Date   Abnormal results of thyroid  function studies 08/16/2011   Chronic kidney disease    Coronary artery disease    Diabetes mellitus without complication (HCC)    Diabetic foot ulcers (HCC) 10/2019   GERD (gastroesophageal reflux disease)    Glaucoma    Hypertension    Overweight    Partial blindness    Past Surgical History:  Procedure Laterality Date   BONE BIOPSY Bilateral 11/03/2019   Procedure: BONE BIOPSY;  Surgeon: Burt Fus, DPM;  Location: [facility] OR;  Service: Podiatry;  Laterality: Bilateral;   CARDIAC CATHETERIZATION     EYE SURGERY     GRAFT APPLICATION Bilateral 11/03/2019   Procedure: GRAFT APPLICATION;  Surgeon: Burt Fus, DPM;  Location: [facility] OR;  Service: Podiatry;  Laterality: Bilateral;  Integra bilayer 8x10   INCISION AND DRAINAGE OF WOUND Bilateral 11/03/2019   Procedure: IRRIGATION AND DEBRIDEMENT WOUND;  Surgeon: Burt Fus, DPM;  Location: [facility] OR;  Service: Podiatry;  Laterality: Bilateral;   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   Patient Active Problem List   Diagnosis Date Noted   DKA (diabetic ketoacidosis) (HCC) 10/28/2019   Sepsis (HCC) 10/28/2019   Essential hypertension 10/28/2019    Hyperkalemia 10/28/2019   CKD (chronic kidney disease) stage 4, GFR 15-29 ml/min (HCC) 10/28/2019   Diabetic foot ulcer (HCC) 10/28/2019   DKA (diabetic ketoacidosis) (HCC) 10/28/2019   Hyperlipidemia 02/22/2019   Trigger middle finger of left hand 12/18/2018   CAD (coronary artery disease) 08/29/2017   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   Aftercare following organ transplant 12/12/2014   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   Immunosuppressed status 12/12/2014   LVH (left ventricular hypertrophy) due to hypertensive disease 12/12/2014   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/10/2014   Diabetes mellitus (HCC) 01/05/2011   Hypertension, benign 12/25/2009    ONSET DATE: 02/24/2024- date of referral  REFERRING DIAG: Z89.512 (ICD-10-CM) - Left below-knee amputee (HCC)  THERAPY DIAG:  Other abnormalities of gait and mobility  Muscle weakness (generalized)  Unsteadiness on feet  Rationale for Evaluation and Treatment: Rehabilitation  SUBJECTIVE:   SUBJECTIVE STATEMENT: Pt presents w/SPC, required min A to safely navigate through clinic. Denies falls or acute changes since last session. Is wearing one sock on LLE, unsure which ply.    Pt accompanied by: self  PERTINENT HISTORY:  diabetes mellitus, chronic kidney disease status post kidney transplant, movement of right eye complication of surgery, CAD, hypertension  PAIN:  Are you having pain? No  PRECAUTIONS: Fall, blind in R eye, severely impaired vision in L eye   RED FLAGS: None   WEIGHT  BEARING RESTRICTIONS: No  FALLS: Has patient fallen in last 6 months? No (last fall >2 years ago)  LIVING ENVIRONMENT: Lives with: lives alone Lives in: House/apartment Home Access: Stairs to enter Home layout: One level Stairs: Yes: 8 steps to enter the apartment, and can reach both rails Has following equipment at home: Single point cane and knee scooter .Uses knee scooter to go to the bathroom   PLOF: Requires assistive device for independence and  Needs assistance with homemaking  PATIENT GOALS: Want to be able to go on the incline and overall balance, walking on different terrain.  OBJECTIVE:  Note: Objective measures were completed at Evaluation unless otherwise noted.    COGNITION: Overall cognitive status: Within functional limits for tasks assessed    FUNCTIONAL TESTS:  10 meter walk test: 0.55 m/s (<0.50 indicates high fall risk, 0.50 to 0.80 m/s is household ambulator) Functional gait assessment:  FUNCTIONAL GAIT ASSESSMENT  Date: 03/02/24 Score  GAIT LEVEL SURFACE Instructions: Walk at your normal speed from here to the next mark (6 m) [20 ft]. (1) Moderate impairment-Walks 6 m (20 ft), slow speed, abnormal gait pattern, evidence for imbalance, or deviates 25.4 - 38.1 cm (10 -15 in) outside of the 30.48-cm (12-in) walkway width. Requires more than 7 seconds to ambulate 6 m (20 ft).  2.   CHANGE IN GAIT SPEED Instructions: Begin walking at your normal pace (for 1.5 m [5 ft]). When I tell you go, walk as fast as you can (for 1.5 m [5 ft]). When I tell you slow, walk as slowly as you can (for 1.5 m [5 ft]. (0) Severe impairment - Cannot change speeds, deviates greater than 38.1 cm (15 in) outside 30.48-cm (12-in) walkway width, or loses balance and has to reach for wall or be caught.  3.    GAIT WITH HORIZONTAL HEAD TURNS Instructions: Walk from here to the next mark 6 m (20 ft) away. Begin walking at your normal pace. Keep walking straight; after 3 steps, turn your head to the right and keep walking straight while looking to the right. After 3 more steps, turn your head to the left and keep walking straight while looking left. Continue alternating looking right and left. (0) Severe impairment-Performs task with severe disruption of gait (eg, staggers 38.1 cm [15 in] outside 30.48-cm (12-in) walkway width, loses balance, stops, or reaches for wall).  4.   GAIT WITH VERTICAL HEAD TURNS Instructions: Walk from here to the next  mark (6 m [20 ft]). Begin walking at your normal pace. Keep walking straight; after 3 steps, tip your head up and keep walking straight while looking up. After 3 more steps, tip your head down, keep walking straight while looking down. Continue  alternating looking up and down every 3 steps until you have completed 2 repetitions in each direction. (0) Severe impairment - Performs task with severe disruption of gait (eg, staggers 38.1 cm [15 in] outside 30.48-cm (12-in) walkway width, loses balance, stops, reaches for wall).  5.  GAIT AND PIVOT TURN Instructions: Begin with walking at your normal pace. When I tell you, turn and stop, turn as quickly as you can to face the opposite direction and stop. (1) Moderate impairment - Turns slowly, requires verbal cueing, or requires several small steps to catch balance following turn and stop  6.   STEP OVER OBSTACLE Instructions: Begin walking at your normal speed. When you come to the shoe box, step over it, not around it, and keep walking. (  1) Moderate impairment - Is able to step over one shoe box (11.43 cm [4.5 in] total height) but must slow down and adjust steps to clear box safely. May require verbal cueing.  7.   GAIT WITH NARROW BASE OF SUPPORT Instructions: Walk on the floor with arms folded across the chest, feet aligned heel to toe in tandem for a distance of 3.6 m [12 ft]. The number of steps taken in a straight line are counted for a maximum of 10 steps. (0) Severe impairment - Ambulates less than 4 steps heel to toe or cannot perform without assistance.  8.   GAIT WITH EYES CLOSED Instructions: Walk at your normal speed from here to the next mark (6 m [20 ft]) with your eyes closed. (2) Mild impairment - Walks 6 m (20 ft), uses assistive device, slower speed, mild gait deviations, deviates 15.24 -25.4 cm (6 -10 in) outside 30.48-cm (12-in) walkway width. Ambulates 6 m (20 ft) in less than 9 seconds but greater than 7 seconds  9.   AMBULATING  BACKWARDS Instructions: Walk backwards until I tell you to stop (2) Mild impairment - Walks 6 m (20 ft), uses assistive device, slower speed, mild gait deviations, deviates 15.24 -25.4 cm (6 -10 in) outside 30.48-cm (12-in) walkway width  10. STEPS Instructions: Walk up these stairs as you would at home (ie, using the rail if necessary). At the top turn around and walk down. (1) Moderate impairment-Two feet to a stair; must use rail.  Total 8/30   Interpretation of scores: Non-Specific Older Adults Cutoff Score: <=22/30 = risk of falls Parkinsons Disease Cutoff score <15/30= fall risk (Hoehn & Yahr 1-4)  Minimally Clinically Important Difference (MCID)  Stroke (acute, subacute, and chronic) = MDC: 4.2 points Vestibular (acute) = MDC: 6 points Community Dwelling Older Adults =  MCID: 4 points Parkinsons Disease  =  MDC: 4.3 points  (Academy of Neurologic Physical Therapy (nd). Functional Gait Assessment. Retrieved from https://www.neuropt.org/docs/default-source/cpgs/core-outcome-measures/function-gait-assessment-pocket-guide-proof9-(2).pdf?sfvrsn=b14f35043_0.)  CCURRENT PROSTHETIC WEAR ASSESSMENT: Patient is independent with: skin check, residual limb care, care of non-amputated limb, prosthetic cleaning, ply sock cleaning, proper wear schedule/adjustment, and proper weight-bearing schedule/adjustment Patient is dependent with: correct ply sock adjustment Donning prosthesis: Complete Independence Doffing prosthesis: Complete Independence Prosthetic wear tolerance: 8+ hours/day, 7 days/week Prosthetic weight bearing tolerance: 30-45 minutes Edema: minimal  Peripheral vision patient has about 5 deg of peripheral vision on left side and about 20 deg to his R side. Pt is only able to clearly see things when they are directly in fron of him. Pt educated on his current peripheral vision and strategies with head turn to ensure safety with obstacle negotiation, environmental scanning and  different levels of walking surfaces.   VITALS  Vitals:   04/04/24 1508  BP: (!) 150/77  Pulse: 71  TREATMENT DATE:   Self-care/home management/NMR  Assessed vitals in LUE (see above) and BP within limits for session.  Discussed services for the blind and low vision OT eval. Provided pt w/printed handout of DSS contact information and pt in agreement to pursue OT low vision evaluation. Therapist to contact PCP today.  The following were performed for improved visual scanning, balance w/head turns and vestibular input:  In corner on airex w/chair in front of pt for safety:  Standing horizontal head turns to hart chart (in inferior field), x2 minutes w/feet hip-width apart and in romberg stance. Pt required intermittent UE support to stabilize w/feet in romberg.  Progressed to standing horizontal head turns to hart chart (in superior field) w/feet in romberg, x4 minutes. Increased difficulty w/visual scanning in superior file.  In corner w/two red post-its in superior visual field on L and R side w/feet in romberg on ground. Added to HEP (see bolded below):  Standing horizontal head turns w/cues to fixate gaze to post-it prior to turning head to opposite side, x3 minutes.  Progressed to vertical head turns, x2 minutes.    Gait pattern: step through pattern and lateral hip instability Distance walked: Various clinic distances  Assistive device utilized: Single point cane Level of assistance: Min A Comments: Pt requires min A to safely navigate clinic due to impaired vision, decreased visual scanning and improper fit of prosthetic on leg.       PATIENT EDUCATION: Education details: updates to HEP, continue wearing socks on LLE, contacting services for the blind   Person educated: Patient Education method: Explanation, Demonstration, Verbal cues, and  Handouts Education comprehension: verbalized understanding, returned demonstration, verbal cues required, and needs further education  HOME EXERCISE PROGRAM: Access Code: [facility] URL: [email or web address] Date: 03/07/2024 Prepared by: Marlon Maurisa Tesmer  Exercises - Staggered Sit-to-Stand  - 1 x daily - 7 x weekly - 1-2 sets - 8-10 reps - Corner Balance Feet Together: Eyes Open With Head Turns (to target)  - 1 x daily - 7 x weekly - 3 sets - 10 reps  ASSESSMENT:  CLINICAL IMPRESSION: Session limited due to pt's late arrival. Emphasis of skilled PT session on pt education, visual scanning and improved vestibular input. Pt encouraged to contact DSS for blind services and therapist to obtain low vision OT referral today. Pt continues to demonstrate decreased visual scanning, especially to L superior field, and has lateral instability w/head turns. Pt will continue to benefit from skilled PT services for improved visual scanning and stability for reduced fall risk and improved independence Continue POC.   OBJECTIVE IMPAIRMENTS: Abnormal gait, decreased activity tolerance, decreased balance, decreased endurance, decreased mobility, difficulty walking, decreased ROM, decreased strength, increased edema, impaired flexibility, impaired sensation, impaired vision/preception, improper body mechanics, postural dysfunction, and prosthetic dependency .   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   REHAB POTENTIAL: Good  CLINICAL DECISION MAKING:  Stable/uncomplicated  EVALUATION COMPLEXITY: High   GOALS: Goals reviewed with patient? Yes  SHORT TERM GOALS: Target date: 03/30/24  Patient will be able to ambulate >500 feet with [place] cane and his prosthetic leg to improve short distance ambulation. Baseline: 230' Goal status: INITIAL  2.  Patient will demo compliance  with HEP to start self managing his symptoms. Baseline: TBD Goal status: INITIAL   LONG TERM GOALS: Target date: 04/27/24  Patient will demo gait speed of 0.80 m/s with [place] cane to improve walking speed and decrease fall risk.  Baseline: 0.55 m/s with prosthetic leg and [place] cane. Goal status: INITIAL  2.  Patient will demo FGA of >15/30 to reduce fall risk.  Baseline: 8/30 (eval) Goal status: INITIAL  3.  Patient will be able to ambulate on grass for 200 feet with st point cane, Baseline: not attempted Goal status: INITIAL  4.  Patient will be able to perform visual scanning to find objects in a busy environment with 100% accuracy to improve ability to navigate through grocery store and find items.  Baseline: relies on sister/family to help with shopping. Goal status: INITIAL     PLAN:  PT FREQUENCY: 2x/week  PT DURATION: 8 weeks  PLANNED INTERVENTIONS: [id number]- PT Re-evaluation, [id number]-Therapeutic exercises, 97530- Therapeutic activity, [id number]- Neuromuscular re-education, 97535- Self Care, [id number]- Manual therapy, [phone]- Gait training, and Patient/Family education  PLAN FOR NEXT SESSION: Did he contact DSS? Did he bring socks?  Issue HEP, work on visual scanning, walking on different level surfaces. Weightbearing tolerance on LLE, eccentric quad control, obstacle navigation, step ups    Meleni Delahunt E Tishina Lown, PT, DPT 04/04/2024, 3:52 PM

## 2024-04-04 NOTE — Progress Notes (Signed)
 Anthony Blanchard                                          MRN: [id number]   04/04/2024   The [facility] Quality Team Specialist reviewed this patient medical record for the purposes of chart review for care gap closure. The following were reviewed: abstraction for care gap closure-glycemic status assessment.    [facility] Quality Team

## 2024-04-06 ENCOUNTER — Ambulatory Visit: Admitting: Physical Therapy

## 2024-04-06 VITALS — BP 150/79 | HR 83

## 2024-04-06 DIAGNOSIS — M6281 Muscle weakness (generalized): Secondary | ICD-10-CM

## 2024-04-06 DIAGNOSIS — R2681 Unsteadiness on feet: Secondary | ICD-10-CM

## 2024-04-06 DIAGNOSIS — R2689 Other abnormalities of gait and mobility: Secondary | ICD-10-CM | POA: Diagnosis not present

## 2024-04-06 NOTE — Therapy (Signed)
 "  OUTPATIENT PHYSICAL THERAPY PROSTHETICS TREATMENT   Patient Name: Anthony Blanchard MRN: [id number] DOB:10-14-1975, 48 y.o., male Today's Date: 04/06/2024  PCP: Dr. Clotilda Single REFERRING PROVIDER: Single Clotilda SAUNDERS, MD  END OF SESSION:  PT End of Session - 04/06/24 1400     Visit Number 7    Number of Visits 13    Date for Recertification  04/27/24    Authorization Type [employer]    Authorization Time Period 03/02/24 - 05/31/24 10 PT visits    PT Start Time 1359    PT Stop Time 1442    PT Time Calculation (min) 43 min    Equipment Utilized During Treatment Gait belt    Activity Tolerance Patient tolerated treatment well    Behavior During Therapy [facility] for tasks assessed/performed              Past Medical History:  Diagnosis Date   Abnormal results of thyroid  function studies 08/16/2011   Chronic kidney disease    Coronary artery disease    Diabetes mellitus without complication (HCC)    Diabetic foot ulcers (HCC) 10/2019   GERD (gastroesophageal reflux disease)    Glaucoma    Hypertension    Overweight    Partial blindness    Past Surgical History:  Procedure Laterality Date   BONE BIOPSY Bilateral 11/03/2019   Procedure: BONE BIOPSY;  Surgeon: Burt Fus, DPM;  Location: [facility] OR;  Service: Podiatry;  Laterality: Bilateral;   CARDIAC CATHETERIZATION     EYE SURGERY     GRAFT APPLICATION Bilateral 11/03/2019   Procedure: GRAFT APPLICATION;  Surgeon: Burt Fus, DPM;  Location: [facility] OR;  Service: Podiatry;  Laterality: Bilateral;  Integra bilayer 8x10   INCISION AND DRAINAGE OF WOUND Bilateral 11/03/2019   Procedure: IRRIGATION AND DEBRIDEMENT WOUND;  Surgeon: Burt Fus, DPM;  Location: [facility] OR;  Service: Podiatry;  Laterality: Bilateral;   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   Patient Active Problem List   Diagnosis Date Noted   DKA (diabetic ketoacidosis) (HCC) 10/28/2019   Sepsis (HCC) 10/28/2019   Essential hypertension 10/28/2019   Hyperkalemia  10/28/2019   CKD (chronic kidney disease) stage 4, GFR 15-29 ml/min (HCC) 10/28/2019   Diabetic foot ulcer (HCC) 10/28/2019   DKA (diabetic ketoacidosis) (HCC) 10/28/2019   Hyperlipidemia 02/22/2019   Trigger middle finger of left hand 12/18/2018   CAD (coronary artery disease) 08/29/2017   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   Aftercare following organ transplant 12/12/2014   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   Immunosuppressed status 12/12/2014   LVH (left ventricular hypertrophy) due to hypertensive disease 12/12/2014   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 Denies falls or acute changes since last session. Not wearing any socks on LLE, forgot to put them on. Also forgot to wear his glasses today. States he called DSS and left a VM, will try again today   Pt accompanied by: self  PERTINENT HISTORY:  diabetes mellitus, chronic kidney disease status post kidney transplant, movement of right eye complication of surgery, CAD, hypertension  PAIN:  Are you having pain? No  PRECAUTIONS: Fall,  blind in R eye, severely impaired vision in L eye   RED FLAGS: None   WEIGHT BEARING RESTRICTIONS: No  FALLS: Has patient fallen in last 6 months? No (last fall >2 years ago)  LIVING ENVIRONMENT: Lives with: lives alone Lives in: House/apartment Home Access: Stairs to enter Home layout: One level Stairs: Yes: 8 steps to enter the apartment, and can reach both rails Has following equipment at home: Single point cane and knee scooter .Uses  knee scooter to go to the bathroom   PLOF: Requires assistive device for independence and Needs assistance with homemaking  PATIENT GOALS: Want to be able to go on the incline and overall balance, walking on different terrain.  OBJECTIVE:  Note: Objective measures were completed at Evaluation unless otherwise noted.    COGNITION: Overall cognitive status: Within functional limits for tasks assessed    FUNCTIONAL TESTS:  10 meter walk test: 0.55 m/s (<0.50 indicates high fall risk, 0.50 to 0.80 m/s is household ambulator) Functional gait assessment:  FUNCTIONAL GAIT ASSESSMENT  Date: 03/02/24 Score  GAIT LEVEL SURFACE Instructions: Walk at your normal speed from here to the next mark (6 m) [20 ft]. (1) Moderate impairment-Walks 6 m (20 ft), slow speed, abnormal gait pattern, evidence for imbalance, or deviates 25.4 - 38.1 cm (10 -15 in) outside of the 30.48-cm (12-in) walkway width. Requires more than 7 seconds to ambulate 6 m (20 ft).  2.   CHANGE IN GAIT SPEED Instructions: Begin walking at your normal pace (for 1.5 m [5 ft]). When I tell you go, walk as fast as you can (for 1.5 m [5 ft]). When I tell you slow, walk as slowly as you can (for 1.5 m [5 ft]. (0) Severe impairment - Cannot change speeds, deviates greater than 38.1 cm (15 in) outside 30.48-cm (12-in) walkway width, or loses balance and has to reach for wall or be caught.  3.    GAIT WITH HORIZONTAL HEAD TURNS Instructions: Walk from here to the next mark 6 m (20 ft) away. Begin walking at your normal pace. Keep walking straight; after 3 steps, turn your head to the right and keep walking straight while looking to the right. After 3 more steps, turn your head to the left and keep walking straight while looking left. Continue alternating looking right and left. (0) Severe impairment-Performs task with severe disruption of gait (eg, staggers 38.1 cm [15 in] outside 30.48-cm (12-in) walkway width, loses balance, stops, or  reaches for wall).  4.   GAIT WITH VERTICAL HEAD TURNS Instructions: Walk from here to the next mark (6 m [20 ft]). Begin walking at your normal pace. Keep walking straight; after 3 steps, tip your head up and keep walking straight while looking up. After 3 more steps, tip your head down, keep walking straight while looking down. Continue  alternating looking up and down every 3 steps until you have completed 2 repetitions in each direction. (0) Severe impairment - Performs task with severe disruption of gait (eg, staggers 38.1 cm [15 in] outside 30.48-cm (12-in) walkway width, loses balance, stops, reaches for wall).  5.  GAIT AND PIVOT TURN Instructions: Begin with walking at your normal pace. When I tell you, turn and stop, turn as quickly as you can to face the opposite direction and stop. (1) Moderate impairment - Turns slowly, requires verbal cueing, or requires several small steps to catch balance following turn and stop  6.   STEP OVER OBSTACLE Instructions: Begin walking at your  normal speed. When you come to the shoe box, step over it, not around it, and keep walking. (1) Moderate impairment - Is able to step over one shoe box (11.43 cm [4.5 in] total height) but must slow down and adjust steps to clear box safely. May require verbal cueing.  7.   GAIT WITH NARROW BASE OF SUPPORT Instructions: Walk on the floor with arms folded across the chest, feet aligned heel to toe in tandem for a distance of 3.6 m [12 ft]. The number of steps taken in a straight line are counted for a maximum of 10 steps. (0) Severe impairment - Ambulates less than 4 steps heel to toe or cannot perform without assistance.  8.   GAIT WITH EYES CLOSED Instructions: Walk at your normal speed from here to the next mark (6 m [20 ft]) with your eyes closed. (2) Mild impairment - Walks 6 m (20 ft), uses assistive device, slower speed, mild gait deviations, deviates 15.24 -25.4 cm (6 -10 in) outside 30.48-cm (12-in) walkway  width. Ambulates 6 m (20 ft) in less than 9 seconds but greater than 7 seconds  9.   AMBULATING BACKWARDS Instructions: Walk backwards until I tell you to stop (2) Mild impairment - Walks 6 m (20 ft), uses assistive device, slower speed, mild gait deviations, deviates 15.24 -25.4 cm (6 -10 in) outside 30.48-cm (12-in) walkway width  10. STEPS Instructions: Walk up these stairs as you would at home (ie, using the rail if necessary). At the top turn around and walk down. (1) Moderate impairment-Two feet to a stair; must use rail.  Total 8/30   Interpretation of scores: Non-Specific Older Adults Cutoff Score: <=22/30 = risk of falls Parkinsons Disease Cutoff score <15/30= fall risk (Hoehn & Yahr 1-4)  Minimally Clinically Important Difference (MCID)  Stroke (acute, subacute, and chronic) = MDC: 4.2 points Vestibular (acute) = MDC: 6 points Community Dwelling Older Adults =  MCID: 4 points Parkinsons Disease  =  MDC: 4.3 points  (Academy of Neurologic Physical Therapy (nd). Functional Gait Assessment. Retrieved from https://www.neuropt.org/docs/default-source/cpgs/core-outcome-measures/function-gait-assessment-pocket-guide-proof9-(2).pdf?sfvrsn=b8f35043_0.)  CCURRENT PROSTHETIC WEAR ASSESSMENT: Patient is independent with: skin check, residual limb care, care of non-amputated limb, prosthetic cleaning, ply sock cleaning, proper wear schedule/adjustment, and proper weight-bearing schedule/adjustment Patient is dependent with: correct ply sock adjustment Donning prosthesis: Complete Independence Doffing prosthesis: Complete Independence Prosthetic wear tolerance: 8+ hours/day, 7 days/week Prosthetic weight bearing tolerance: 30-45 minutes Edema: minimal  Peripheral vision patient has about 5 deg of peripheral vision on left side and about 20 deg to his R side. Pt is only able to clearly see things when they are directly in fron of him. Pt educated on his current peripheral vision and  strategies with head turn to ensure safety with obstacle negotiation, environmental scanning and different levels of walking surfaces.   VITALS  Vitals:   04/06/24 1402  BP: (!) 150/79  Pulse: 83  TREATMENT DATE:   Self-care/home management/NMR/ther Act  Assessed vitals in LUE (see above) and BP within limits for session.  SciFit multi-peaks level 8.5 for 8 minutes using BUE/BLEs for neural priming for reciprocal movement, dynamic cardiovascular warmup and increased amplitude of stepping.  6 Blaze pods on random setting for improved visual scanning, proximal stability and reaching out of BOS.  Performed on 2.5 minute intervals with 2 minute seated rest periods.  Pt requires SBA guarding. Performed without L prosthetic donned:  Round 1:  while quadruped on mat, placed 4 pods in horizontal line in front of pt and then single pod to each side.  44 hits. Round 2:  same pod setup but placed pt's BLEs on rockerboard in L/R direction (padded by airex).  41 hits. Round 3:  Placed one pod on BAPS board and 2 pods on 4 step in same orientation for added visual scanning. Pt on rockerboard in L/R direction.  49 hits. Notable errors/deficits:  Increased difficulty locating pod in superolateral L visual field.       Gait pattern: step through pattern and lateral hip instability Distance walked: 36' + Various clinic distances  Assistive device utilized: Single point cane Level of assistance: CGA Comments: Pt requires CGA to safely navigate clinic due to impaired vision, decreased visual scanning and improper fit of prosthetic on leg due to not wearing socks. Increased lateral instability on L due to shorter leg height.     PATIENT EDUCATION: Education details: Continue HEP, continue wearing socks on LLE, contacting services for the blind, plan to DC next week  Person  educated: Patient Education method: Explanation, Demonstration, and Verbal cues Education comprehension: verbalized understanding, returned demonstration, verbal cues required, and needs further education  HOME EXERCISE PROGRAM: Access Code: [facility] URL: [email or web address] Date: 03/07/2024 Prepared by: Marlon Anamari Galeas  Exercises - Staggered Sit-to-Stand  - 1 x daily - 7 x weekly - 1-2 sets - 8-10 reps - Corner Balance Feet Together: Eyes Open With Head Turns (to target)  - 1 x daily - 7 x weekly - 3 sets - 10 reps  ASSESSMENT:  CLINICAL IMPRESSION: Emphasis of skilled PT session on STG assessment, proximal stability and visual scanning. Pt has met 2 of 2 STGs, reporting independence w/HEP and ambulating >500' w/straight cane at [facility] level. Pt forgot to wear socks on LLE today, resulting in increased lateral instability on L side which pt acknowledged and reports he will be better about wearing socks. Pt continues to require verbal cues to perform visual scanning, especially in L superior field, but has improved since evaluation. Continue POC.   OBJECTIVE IMPAIRMENTS: Abnormal gait, decreased activity tolerance, decreased balance, decreased endurance, decreased mobility, difficulty walking, decreased ROM, decreased strength, increased edema, impaired flexibility, impaired sensation, impaired vision/preception, improper body mechanics, postural dysfunction, and prosthetic dependency .   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   REHAB POTENTIAL:  Good  CLINICAL DECISION MAKING: Stable/uncomplicated  EVALUATION COMPLEXITY: High   GOALS: Goals reviewed with patient? Yes  SHORT TERM GOALS: Target date: 03/30/24  Patient will be able to ambulate >500 feet with [place] cane and his prosthetic leg to improve short distance ambulation. Baseline: 230' Goal status: MET  2.  Patient will demo compliance with HEP to start self managing his symptoms. Baseline: TBD Goal status: MET   LONG TERM GOALS: Target date: 04/27/24  Patient will demo gait speed of 0.80 m/s with [place] cane to improve walking speed and decrease fall risk.  Baseline: 0.55 m/s with prosthetic leg and [place] cane. Goal status: INITIAL  2.  Patient will demo FGA of >15/30 to reduce fall risk.  Baseline: 8/30 (eval) Goal status: INITIAL  3.  Patient will be able to ambulate on grass for 200 feet with st point cane, Baseline: not attempted Goal status: INITIAL  4.  Patient will be able to perform visual scanning to find objects in a busy environment with 100% accuracy to improve ability to navigate through grocery store and find items.  Baseline: relies on sister/family to help with shopping. Goal status: INITIAL     PLAN:  PT FREQUENCY: 2x/week  PT DURATION: 8 weeks  PLANNED INTERVENTIONS: [id number]- PT Re-evaluation, 97110-Therapeutic exercises, 97530- Therapeutic activity, [id number]- Neuromuscular re-education, 97535- Self Care, [id number]- Manual therapy, [phone]- Gait training, and Patient/Family education  PLAN FOR NEXT SESSION: Did he contact DSS? Did he bring socks?  Issue HEP, work on visual scanning, walking on different level surfaces. Weightbearing tolerance on LLE, eccentric quad control, obstacle navigation, step ups    Marlon BRAVO Joseff Luckman, PT, DPT 04/06/2024, 2:48 PM    "

## 2024-04-09 ENCOUNTER — Ambulatory Visit: Admitting: Physical Therapy

## 2024-04-11 ENCOUNTER — Ambulatory Visit: Admitting: Physical Therapy

## 2024-04-11 VITALS — BP 166/82 | HR 78

## 2024-04-11 DIAGNOSIS — R2689 Other abnormalities of gait and mobility: Secondary | ICD-10-CM | POA: Diagnosis not present

## 2024-04-11 DIAGNOSIS — M6281 Muscle weakness (generalized): Secondary | ICD-10-CM

## 2024-04-11 DIAGNOSIS — R2681 Unsteadiness on feet: Secondary | ICD-10-CM

## 2024-04-11 NOTE — Therapy (Signed)
 "  OUTPATIENT PHYSICAL THERAPY PROSTHETICS TREATMENT - DISCHARGE SUMMARY    Patient Name: Anthony Blanchard MRN: [id number] DOB:1975-12-04, 48 y.o., male Today's Date: 04/11/2024  PCP: Dr. Clotilda Single REFERRING PROVIDER: Single Clotilda SAUNDERS, MD  PHYSICAL THERAPY DISCHARGE SUMMARY  Visits from Start of Care: 8  Current functional level related to goals / functional outcomes: Pt is mod I w/use of SPC in familiar environments, requires SBA and min cues to navigate busy/unfamiliar environments due to vision impairments    Remaining deficits: Impaired vision, decreased visual scanning abilities, high fall risk    Education / Equipment: HEP, plan to transition to low vision OT    Patient agrees to discharge. Patient goals were partially met. Patient is being discharged due to maximized PT potential and transition to low vision OT.   END OF SESSION:  PT End of Session - 04/11/24 0920     Visit Number 8    Number of Visits 13    Date for Recertification  04/27/24    Authorization Type [employer]    Authorization Time Period 03/02/24 - 05/31/24 10 PT visits    PT Start Time 0919    PT Stop Time 1007    PT Time Calculation (min) 48 min    Equipment Utilized During Treatment Gait belt    Activity Tolerance Patient tolerated treatment well    Behavior During Therapy [facility] for tasks assessed/performed               Past Medical History:  Diagnosis Date   Abnormal results of thyroid  function studies 08/16/2011   Chronic kidney disease    Coronary artery disease    Diabetes mellitus without complication (HCC)    Diabetic foot ulcers (HCC) 10/2019   GERD (gastroesophageal reflux disease)    Glaucoma    Hypertension    Overweight    Partial blindness    Past Surgical History:  Procedure Laterality Date   BONE BIOPSY Bilateral 11/03/2019   Procedure: BONE BIOPSY;  Surgeon: Burt Fus, DPM;  Location: [facility] OR;  Service: Podiatry;  Laterality: Bilateral;   CARDIAC  CATHETERIZATION     EYE SURGERY     GRAFT APPLICATION Bilateral 11/03/2019   Procedure: GRAFT APPLICATION;  Surgeon: Burt Fus, DPM;  Location: [facility] OR;  Service: Podiatry;  Laterality: Bilateral;  Integra bilayer 8x10   INCISION AND DRAINAGE OF WOUND Bilateral 11/03/2019   Procedure: IRRIGATION AND DEBRIDEMENT WOUND;  Surgeon: Burt Fus, DPM;  Location: [facility] OR;  Service: Podiatry;  Laterality: Bilateral;   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   Immunosuppressed status 12/12/2014   LVH (left ventricular hypertrophy) due to hypertensive disease 12/12/2014   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/10/2014   Diabetes mellitus (HCC) 01/05/2011   Hypertension, benign 12/25/2009    ONSET DATE: 02/24/2024- date of referral  REFERRING DIAG: Z89.512 (ICD-10-CM) - Left below-knee amputee (HCC)  THERAPY DIAG:  Other abnormalities of gait and mobility  Unsteadiness on  feet  Muscle weakness (generalized)  Rationale for Evaluation and Treatment: Rehabilitation  SUBJECTIVE:   SUBJECTIVE STATEMENT: Pt presents w/SPC, required min A (verbal cues) to safely navigate through clinic. Denies falls or acute changes since last session. Wearing single ply sock on LLE, reports he needs more socks on. Is in agreement to DC today.   Pt accompanied by: self  PERTINENT HISTORY:  diabetes mellitus, chronic kidney disease status post kidney  transplant, movement of right eye complication of surgery, CAD, hypertension  PAIN:  Are you having pain? No  PRECAUTIONS: Fall, blind in R eye, severely impaired vision in L eye   RED FLAGS: None   WEIGHT BEARING RESTRICTIONS: No  FALLS: Has patient fallen in last 6 months? No (last fall >2 years ago)  LIVING ENVIRONMENT: Lives with: lives alone Lives in: House/apartment Home Access: Stairs to enter Home layout: One level Stairs: Yes: 8 steps to enter the apartment, and can reach both rails Has following equipment at home: Single point cane and knee scooter .Uses knee scooter to go to the bathroom   PLOF: Requires assistive device for independence and Needs assistance with homemaking  PATIENT GOALS: Want to be able to go on the incline and overall balance, walking on different terrain.  OBJECTIVE:  Note: Objective measures were completed at Evaluation unless otherwise noted.    COGNITION: Overall cognitive status: Within functional limits for tasks assessed    FUNCTIONAL TESTS:  10 meter walk test: 0.55 m/s (<0.50 indicates high fall risk, 0.50 to 0.80 m/s is household ambulator) Functional gait assessment:  FUNCTIONAL GAIT ASSESSMENT  Date: 03/02/24 Score  GAIT LEVEL SURFACE Instructions: Walk at your normal speed from here to the next mark (6 m) [20 ft]. (1) Moderate impairment-Walks 6 m (20 ft), slow speed, abnormal gait pattern, evidence for imbalance, or deviates 25.4 - 38.1 cm (10 -15 in) outside of the 30.48-cm (12-in) walkway width. Requires more than 7 seconds to ambulate 6 m (20 ft).  2.   CHANGE IN GAIT SPEED Instructions: Begin walking at your normal pace (for 1.5 m [5 ft]). When I tell you go, walk as fast as you can (for 1.5 m [5 ft]). When I tell you slow, walk as slowly as you can (for 1.5 m [5 ft]. (0) Severe impairment - Cannot change speeds, deviates greater than 38.1 cm (15 in) outside 30.48-cm (12-in) walkway width, or loses balance and has to  reach for wall or be caught.  3.    GAIT WITH HORIZONTAL HEAD TURNS Instructions: Walk from here to the next mark 6 m (20 ft) away. Begin walking at your normal pace. Keep walking straight; after 3 steps, turn your head to the right and keep walking straight while looking to the right. After 3 more steps, turn your head to the left and keep walking straight while looking left. Continue alternating looking right and left. (0) Severe impairment-Performs task with severe disruption of gait (eg, staggers 38.1 cm [15 in] outside 30.48-cm (12-in) walkway width, loses balance, stops, or reaches for wall).  4.   GAIT WITH VERTICAL HEAD TURNS Instructions: Walk from here to the next mark (6 m [20 ft]). Begin walking at your normal pace. Keep walking straight; after 3 steps, tip your head up and keep walking straight while looking up. After 3 more steps, tip your head down, keep walking straight while looking down. Continue  alternating looking up and down every 3 steps until you have completed 2 repetitions in each direction. (  0) Severe impairment - Performs task with severe disruption of gait (eg, staggers 38.1 cm [15 in] outside 30.48-cm (12-in) walkway width, loses balance, stops, reaches for wall).  5.  GAIT AND PIVOT TURN Instructions: Begin with walking at your normal pace. When I tell you, turn and stop, turn as quickly as you can to face the opposite direction and stop. (1) Moderate impairment - Turns slowly, requires verbal cueing, or requires several small steps to catch balance following turn and stop  6.   STEP OVER OBSTACLE Instructions: Begin walking at your normal speed. When you come to the shoe box, step over it, not around it, and keep walking. (1) Moderate impairment - Is able to step over one shoe box (11.43 cm [4.5 in] total height) but must slow down and adjust steps to clear box safely. May require verbal cueing.  7.   GAIT WITH NARROW BASE OF SUPPORT Instructions: Walk on the floor with  arms folded across the chest, feet aligned heel to toe in tandem for a distance of 3.6 m [12 ft]. The number of steps taken in a straight line are counted for a maximum of 10 steps. (0) Severe impairment - Ambulates less than 4 steps heel to toe or cannot perform without assistance.  8.   GAIT WITH EYES CLOSED Instructions: Walk at your normal speed from here to the next mark (6 m [20 ft]) with your eyes closed. (2) Mild impairment - Walks 6 m (20 ft), uses assistive device, slower speed, mild gait deviations, deviates 15.24 -25.4 cm (6 -10 in) outside 30.48-cm (12-in) walkway width. Ambulates 6 m (20 ft) in less than 9 seconds but greater than 7 seconds  9.   AMBULATING BACKWARDS Instructions: Walk backwards until I tell you to stop (2) Mild impairment - Walks 6 m (20 ft), uses assistive device, slower speed, mild gait deviations, deviates 15.24 -25.4 cm (6 -10 in) outside 30.48-cm (12-in) walkway width  10. STEPS Instructions: Walk up these stairs as you would at home (ie, using the rail if necessary). At the top turn around and walk down. (1) Moderate impairment-Two feet to a stair; must use rail.  Total 8/30   Interpretation of scores: Non-Specific Older Adults Cutoff Score: <=22/30 = risk of falls Parkinsons Disease Cutoff score <15/30= fall risk (Hoehn & Yahr 1-4)  Minimally Clinically Important Difference (MCID)  Stroke (acute, subacute, and chronic) = MDC: 4.2 points Vestibular (acute) = MDC: 6 points Community Dwelling Older Adults =  MCID: 4 points Parkinsons Disease  =  MDC: 4.3 points  (Academy of Neurologic Physical Therapy (nd). Functional Gait Assessment. Retrieved from https://www.neuropt.org/docs/default-source/cpgs/core-outcome-measures/function-gait-assessment-pocket-guide-proof9-(2).pdf?sfvrsn=b89f35043_0.)  CCURRENT PROSTHETIC WEAR ASSESSMENT: Patient is independent with: skin check, residual limb care, care of non-amputated limb, prosthetic cleaning, ply sock cleaning,  proper wear schedule/adjustment, and proper weight-bearing schedule/adjustment Patient is dependent with: correct ply sock adjustment Donning prosthesis: Complete Independence Doffing prosthesis: Complete Independence Prosthetic wear tolerance: 8+ hours/day, 7 days/week Prosthetic weight bearing tolerance: 30-45 minutes Edema: minimal  Peripheral vision patient has about 5 deg of peripheral vision on left side and about 20 deg to his R side. Pt is only able to clearly see things when they are directly in fron of him. Pt educated on his current peripheral vision and strategies with head turn to ensure safety with obstacle negotiation, environmental scanning and different levels of walking surfaces.   VITALS  Vitals:   04/11/24 0924 04/11/24 0927  BP: (!) 184/84 (!) 166/82  Pulse: 81 78  TREATMENT DATE:   Self-care/home management/Physical Performance  Assessed vitals in LUE (see above) and systolic BP initially elevated beyond safe limits for therapy. Pt reports he did not take his BP medications this AM. After a few minutes of seated rest, pt's BP did reduce to be within limits for PT.  Lengthy discussion regarding importance of pt doing more for himself at home, such as placing his own grocery order or scanning for items at the store w/assistance from sister when needed. Pt acknowledges that he has gotten lazy and accepts help from others when he does not need it. Pt reports his sister does not like for him to place his own grocery order because he takes too long to do it, so encouraged pt to advocate for himself and do more for himself to promote independence and maintain strength. Pt verbalized agreement and understanding  Encouraged pt to call PCP and request order for low vision OT.     [facility] PT Assessment - 04/11/24 0942       Balance   Balance  Assessed Yes      Standardized Balance Assessment   Standardized Balance Assessment 10 meter walk test    10 Meter Walk 0.64 m/s w/SPC   52m over 15.72s w/SPC     Functional Gait  Assessment   Gait assessed  Yes    Gait Level Surface Walks 20 ft, slow speed, abnormal gait pattern, evidence for imbalance or deviates 10-15 in outside of the 12 in walkway width. Requires more than 7 sec to ambulate 20 ft.   9.84s   Change in Gait Speed Makes only minor adjustments to walking speed, or accomplishes a change in speed with significant gait deviations, deviates 10-15 in outside the 12 in walkway width, or changes speed but loses balance but is able to recover and continue walking.    Gait with Horizontal Head Turns Performs head turns smoothly with no change in gait. Deviates no more than 6 in outside 12 in walkway width    Gait with Vertical Head Turns Performs head turns with no change in gait. Deviates no more than 6 in outside 12 in walkway width.    Gait and Pivot Turn Pivot turns safely in greater than 3 sec and stops with no loss of balance, or pivot turns safely within 3 sec and stops with mild imbalance, requires small steps to catch balance.    Step Over Obstacle Is able to step over one shoe box (4.5 in total height) but must slow down and adjust steps to clear box safely. May require verbal cueing.    Gait with Narrow Base of Support Ambulates less than 4 steps heel to toe or cannot perform without assistance.    Gait with Eyes Closed Walks 20 ft, slow speed, abnormal gait pattern, evidence for imbalance, deviates 10-15 in outside 12 in walkway width. Requires more than 9 sec to ambulate 20 ft.   12.75s   Ambulating Backwards Walks 20 ft, uses assistive device, slower speed, mild gait deviations, deviates 6-10 in outside 12 in walkway width.    Steps Two feet to a stair, must use rail.    Total Score 15    FGA comment: 15/30, high fall risk   Used SPC        Discussed results of goals  and improvements that pt made on FGA. Encouraged pt to continue working on his HEP and doing more for himself at home. Pt states he has a list of goals  he would like to work on at home and feels good about his HEP and DC today. Encouraged pt to return in a few months if needed once he works on his goals at home.     Gait pattern: step through pattern and lateral hip instability Distance walked: Various clinic distances  Assistive device utilized: Single point cane Level of assistance: SBA Comments: Pt requires SBA to safely navigate clinic due to impaired vision, decreased visual scanning and improper fit of prosthetic on leg due to not wearing enough socks. Min verbal cues provided to assist pt in navigation    PATIENT EDUCATION: Education details: Continue HEP, importance of doing things for himself that he can safely do (placing grocery order, looking for items at grocery store). Goal results. Encouragement to call PCP and ask for low vision OT referral.  Person educated: Patient Education method: Explanation, Demonstration, and Verbal cues Education comprehension: verbalized understanding, returned demonstration, and verbal cues required  HOME EXERCISE PROGRAM: Access Code: [facility] URL: [email or web address] Date: 03/07/2024 Prepared by: Marlon Gianlucca Szymborski  Exercises - Staggered Sit-to-Stand  - 1 x daily - 7 x weekly - 1-2 sets - 8-10 reps - Corner Balance Feet Together: Eyes Open With Head Turns (to target)  - 1 x daily - 7 x weekly - 3 sets - 10 reps  ASSESSMENT:  CLINICAL IMPRESSION: Emphasis of skilled PT session on LTG assessment and DC from PT. Therapist DC 2 of 4 LTGs as they are not applicable or safe for pt due to vision impairments. Pt has met 1 of 2 LTGs, improving his score on FGA to 15/30 w/SPC, up from 8/30 on evaluation. Pt also improved his gait speed to 0.64 m/s, reducing his fall risk and improving his community ambulation ability, but not quite meeting  his LTG. Pt most limited by vision impairments but has improved his visual scanning ability and his overall safety/stability w/dynamic tasks. Pt acknowledges that he has gotten lazy with allowing others to do tasks for him that he is able to do himself, resulting in decreased strength and impaired ability to visually scan his environment, resulting in increased fall risk. However, pt has made significant improvements w/visual scanning in PT and will further benefit from low vision OT to improve independence. Pt to ask PCP for referral to low vision OT and may return to PT in future if mobility needs change.   OBJECTIVE IMPAIRMENTS: Abnormal gait, decreased activity tolerance, decreased balance, decreased endurance, decreased mobility, difficulty walking, decreased ROM, decreased strength, increased edema, impaired flexibility, impaired sensation, impaired vision/preception, improper body mechanics, postural dysfunction, and prosthetic dependency .   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   REHAB POTENTIAL: Good  CLINICAL DECISION MAKING: Stable/uncomplicated  EVALUATION COMPLEXITY: High   GOALS: Goals reviewed with patient? Yes  SHORT TERM GOALS: Target date: 03/30/24  Patient will be able to ambulate >500 feet with [place] cane and his prosthetic leg to improve short distance ambulation. Baseline: 230' Goal status: MET  2.  Patient will demo compliance with HEP to start self managing his symptoms. Baseline: TBD Goal status: MET   LONG TERM GOALS:  Target date: 04/27/24  Patient will demo gait speed of 0.80 m/s  with [place] cane to improve walking speed and decrease fall risk.  Baseline: 0.55 m/s with prosthetic leg and [place] cane.; 0.64 m/s w/SPC (12/24) Goal status: NOT MET   2.  Patient will demo FGA of >15/30 to reduce fall risk.  Baseline: 8/30 (eval); 15/30 (12/24) Goal status: MET   3.  Patient will be able to ambulate on grass for 200 feet with st point cane, Baseline: not attempted Goal status: DC  4.  Patient will be able to perform visual scanning to find objects in a busy environment with 100% accuracy to improve ability to navigate through grocery store and find items.  Baseline: relies on sister/family to help with shopping. Goal status: DC     PLAN:  PT FREQUENCY: 2x/week  PT DURATION: 8 weeks  PLANNED INTERVENTIONS: [id number]- PT Re-evaluation, 97110-Therapeutic exercises, 97530- Therapeutic activity, [id number]- Neuromuscular re-education, 97535- Self Care, [id number]- Manual therapy, [phone]- Gait training, and Patient/Family education    Marlon BRAVO Ryzen Deady, PT, DPT 04/11/2024, 10:15 AM    "

## 2024-04-13 ENCOUNTER — Other Ambulatory Visit: Payer: Self-pay | Admitting: Family Medicine

## 2024-04-13 DIAGNOSIS — H5461 Unqualified visual loss, right eye, normal vision left eye: Secondary | ICD-10-CM

## 2024-04-13 NOTE — Telephone Encounter (Signed)
 Referral placed.  Let me know if it is the correct one. I am OOO until 04/25/24. Thanks.

## 2024-04-16 DIAGNOSIS — Z940 Kidney transplant status: Principal | ICD-10-CM

## 2024-04-16 DIAGNOSIS — T8611 Kidney transplant rejection: Principal | ICD-10-CM

## 2024-04-25 ENCOUNTER — Encounter: Payer: Self-pay | Admitting: Family Medicine

## 2024-04-25 ENCOUNTER — Ambulatory Visit: Admitting: Family Medicine

## 2024-04-25 VITALS — BP 134/72 | HR 75 | Temp 98.7°F | Ht 69.0 in | Wt 239.8 lb

## 2024-04-25 DIAGNOSIS — E1169 Type 2 diabetes mellitus with other specified complication: Secondary | ICD-10-CM

## 2024-04-25 DIAGNOSIS — Z0189 Encounter for other specified special examinations: Secondary | ICD-10-CM

## 2024-04-25 DIAGNOSIS — Z1211 Encounter for screening for malignant neoplasm of colon: Secondary | ICD-10-CM | POA: Diagnosis not present

## 2024-04-25 DIAGNOSIS — Z89512 Acquired absence of left leg below knee: Secondary | ICD-10-CM

## 2024-04-25 DIAGNOSIS — R0981 Nasal congestion: Secondary | ICD-10-CM

## 2024-04-25 DIAGNOSIS — I10 Essential (primary) hypertension: Secondary | ICD-10-CM | POA: Diagnosis not present

## 2024-04-25 DIAGNOSIS — E1122 Type 2 diabetes mellitus with diabetic chronic kidney disease: Secondary | ICD-10-CM | POA: Diagnosis not present

## 2024-04-25 DIAGNOSIS — I2510 Atherosclerotic heart disease of native coronary artery without angina pectoris: Secondary | ICD-10-CM | POA: Diagnosis not present

## 2024-04-25 DIAGNOSIS — Z794 Long term (current) use of insulin: Secondary | ICD-10-CM | POA: Diagnosis not present

## 2024-04-25 DIAGNOSIS — H6122 Impacted cerumen, left ear: Secondary | ICD-10-CM | POA: Diagnosis not present

## 2024-04-25 DIAGNOSIS — N184 Chronic kidney disease, stage 4 (severe): Secondary | ICD-10-CM | POA: Diagnosis not present

## 2024-04-25 NOTE — Patient Instructions (Addendum)
 You can find Debrox eardrops over-the-counter to help with earwax removal.  You can also find diabetic-tussin over-the-counter to help with cough.   It will not raise your blood sugar.

## 2024-04-25 NOTE — Progress Notes (Unsigned)
 "  Established Patient Office Visit   Subjective  Patient ID: Anthony Blanchard, male    DOB: 15-Mar-1976  Age: 49 y.o. MRN: [id number]  Chief Complaint  Patient presents with   Medical Management of Chronic Issues    Patient came in today for 2 month follow-up for DM, CKD, and Blood pressure   Pt accompanied by his son.  Pt is a 49 year old male seen for follow-up.  Patient states developing a cold.  Endorses nasal congestion x 2 days.  Denies cough, sore throat, fever, chills, ear pain/pressure.  Nasal congestion  ***is a 49 year old male with diabetes and hypertension who presents for a follow-up visit. He is accompanied by his son.  He has been engaging in chair exercises provided by Medicare since his last visit but reports no weight loss and feels he has gained two pounds, attributing this to holiday eating and difficulty accessing the gym due to transportation issues. He is trying to improve his diet, which has resulted in better blood pressure control, now at 134/72 mmHg compared to 184/84 mmHg previously.  His blood sugar levels have been fluctuating, particularly in the last two days, possibly due to a cold. He has congestion and has been using cold medicine, which he later realized contains sugar. His blood sugar reached 289 mg/dL after administering 8 units of insulin  but decreased to 179 mg/dL by bedtime after taking Lantus . He is mindful of his diet, consuming baked chicken and cauliflower without starches.  He is managing a prosthetic leg and reports that it fits better with two socks, although he only has one sock on today due to laundry. He uses a stump shrinker at night.  He has been seeing a kidney doctor and had labs done at [facility] last month. His A1c was 8.4% in November, and he hopes it has improved with recent lifestyle changes. He is considering taking vitamins but is cautious due to his kidney issues. He has been using beetroot powder to help with blood pressure and  energy.    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   Immunosuppressed status 12/12/2014   LVH (left ventricular hypertrophy) due to hypertensive disease 12/12/2014   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/10/2014   Diabetes mellitus (HCC) 01/05/2011   Hypertension, benign 12/25/2009   Past Medical History:  Diagnosis Date   Abnormal results of thyroid  function studies 08/16/2011   Chronic kidney disease    Coronary artery disease    Diabetes mellitus without complication (HCC)    Diabetic foot ulcers (HCC) 10/2019   GERD (gastroesophageal reflux disease)    Glaucoma    Hypertension    Overweight    Partial blindness    Past Surgical History:  Procedure Laterality Date   BONE BIOPSY Bilateral 11/03/2019   Procedure: BONE BIOPSY;  Surgeon: Burt Fus, DPM;  Location: [facility] OR;  Service: Podiatry;  Laterality: Bilateral;   CARDIAC CATHETERIZATION     EYE SURGERY     GRAFT APPLICATION Bilateral  11/03/2019   Procedure: GRAFT APPLICATION;  Surgeon: Burt Fus, DPM;  Location: [facility] OR;  Service: Podiatry;  Laterality: Bilateral;  Integra bilayer 8x10   INCISION AND DRAINAGE OF WOUND Bilateral 11/03/2019   Procedure: IRRIGATION AND DEBRIDEMENT WOUND;  Surgeon: Burt Fus, DPM;  Location: [facility] OR;  Service: Podiatry;  Laterality: Bilateral;   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   Social History[1] Family History  Problem Relation Age of Onset   Diabetes Mother     Alcohol abuse Father    Allergies[2]  ROS Negative unless stated above    Objective:     BP 134/72 (BP Location: Left Arm, Patient Position: Sitting, Cuff Size: Large)   Pulse 75   Temp 98.7 F (37.1 C) (Oral)   Ht 5' 9 (1.753 m)   Wt 239 lb 12.8 oz (108.8 kg)   SpO2 95%   BMI 35.41 kg/m  BP Readings from Last 3 Encounters:  04/25/24 134/72  04/11/24 (!) 166/82  04/06/24 (!) 150/79   Wt Readings from Last 3 Encounters:  04/25/24 239 lb 12.8 oz (108.8 kg)  02/24/24 239 lb (108.4 kg)  03/23/23 254 lb 9.6 oz (115.5 kg)      Physical Exam Constitutional:      General: He is not in acute distress.    Appearance: Normal appearance.  HENT:     Head: Normocephalic and atraumatic.     Left Ear: There is impacted cerumen.     Nose: Nose normal.     Mouth/Throat:     Mouth: Mucous membranes are moist.  Cardiovascular:     Rate and Rhythm: Normal rate and regular rhythm.     Heart sounds: Normal heart sounds. No murmur heard.    No gallop.  Pulmonary:     Effort: Pulmonary effort is normal. No respiratory distress.     Breath sounds: Normal breath sounds. No wheezing, rhonchi or rales.  Musculoskeletal:     Comments: L BKA.  Prosthesis in place.  Skin:    General: Skin is warm and dry.  Neurological:     Mental Status: He is alert and oriented to person, place, and time.        02/24/2024    2:25 PM  Depression screen PHQ 2/9  Decreased Interest 0  Down, Depressed, Hopeless 0  PHQ - 2 Score 0  Altered sleeping 1  Tired, decreased energy 1  Change in appetite 1  Feeling bad or failure about yourself  1  Trouble concentrating 0  Moving slowly or fidgety/restless 0  Suicidal thoughts 0  PHQ-9 Score 4  Difficult doing work/chores Not difficult at all      02/24/2024    2:26 PM  GAD 7 : Generalized Anxiety Score  Nervous, Anxious, on Edge 1  Control/stop worrying 1  Worry too much - different things 1  Trouble relaxing 1  Restless 0  Easily  annoyed or irritable 0  Afraid - awful might happen 0  Total GAD 7 Score 4  Anxiety Difficulty Not difficult at all     No results found for any visits on 04/25/24.    Assessment & Plan:   Type 2 diabetes mellitus with other specified complication, with long-term current use of insulin  (HCC)  Essential hypertension  CVD (cardiovascular disease)  CKD (chronic kidney disease) stage 4, GFR 15-29 ml/min (HCC)  Colon cancer screening -     Ambulatory referral to Gastroenterology  Nasal congestion  Impacted cerumen of left ear  Left below-knee amputee (HCC)  Type 2 diabetes mellitus   Blood glucose levels are fluctuating, likely due to sugar in cold medicine, with an A1c of 8.4% indicating suboptimal control. Consider Safetussin for cold symptoms to avoid blood sugar spikes. Continue regular blood glucose monitoring and encourage dietary modifications.  Essential hypertension   Blood pressure has improved to 134/72 mmHg with dietary changes and weight management. Continue current dietary modifications and weight management efforts.  Chronic kidney disease, stage 4   Caution advised with vitamin intake due to kidney function. Continue monitoring kidney function through regular lab tests and avoid unnecessary vitamin supplements.  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 Vision issues noted. Continue using socks for prosthetic fitting as needed and contact the prosthetic provider if fitting issues arise before April.  General health maintenance   Discussed balanced diet, regular exercise, and potential benefits of beetroot powder for blood pressure control. Continue chair exercises and dietary improvements. Consider beetroot powder for blood pressure control. Colonoscopy ordered.  Return in about 4 months (around 08/23/2024).   Clotilda JONELLE Single, MD      [1]  Social History Tobacco Use   Smoking status: Never    Smokeless tobacco: Never  Vaping Use   Vaping status: Never Used  Substance Use Topics   Alcohol use: Not Currently   Drug use: Never  [2]  Allergies Allergen Reactions   Shellfish Allergy Anaphylaxis   Shellfish Protein-Containing Drug Products Anaphylaxis   Sulfa Antibiotics Anaphylaxis    Other reaction(s): Other (See Comments)   Iodinated Contrast Media Other (See Comments)    Other reaction(s): Other (see comments)   Iodine Other (See Comments)    Other reaction(s): Other (see comments)   "

## 2024-05-14 DIAGNOSIS — Z940 Kidney transplant status: Principal | ICD-10-CM

## 2024-05-14 DIAGNOSIS — T8611 Kidney transplant rejection: Secondary | ICD-10-CM

## 2024-05-17 ENCOUNTER — Ambulatory Visit: Admitting: Family Medicine

## 2024-05-17 DIAGNOSIS — Z0000 Encounter for general adult medical examination without abnormal findings: Secondary | ICD-10-CM

## 2024-05-17 NOTE — Patient Instructions (Addendum)
 I really enjoyed getting to talk with you today! I am available on Tuesdays and Thursdays for virtual visits if you have any questions or concerns, or if I can be of any further assistance.   CHECKLIST FROM ANNUAL WELLNESS VISIT:  -Follow up (please call to schedule if not scheduled after visit):   -yearly for annual wellness visit with primary care office  Here is a list of your preventive care/health maintenance measures and the plan for each if any are due:  PLAN For any measures below that may be due:    1. Can get vaccines at the pharmacy. Can get some at the office - just call to check and to schedule visit.  Please let us  know if you do so that we can update your record.    2. If you change your mind about colon cancer screening please let us  know.   3. Can do foot exam and labs at next in office visit.    3. Please have your eye doctor send the eye report to Dr. Mercer.   Health Maintenance  Topic Date Due   Hepatitis C Screening  Never done   DTaP/Tdap/Td (1 - Tdap) Never done   Hepatitis B Vaccines 19-59 Average Risk (1 of 3 - 19+ 3-dose series) Never done   Influenza Vaccine  11/18/2023   OPHTHALMOLOGY EXAM  12/02/2023   COVID-19 Vaccine (4 - 2025-26 season) 12/19/2023   Medicare Annual Wellness (AWV)  02/02/2024   Diabetic kidney evaluation - eGFR measurement  03/10/2024   FOOT EXAM  03/15/2024   Colonoscopy  05/17/2025 (Originally 03/14/2021)   Diabetic kidney evaluation - Urine ACR  08/23/2024   HEMOGLOBIN A1C  08/23/2024   Pneumococcal Vaccine (4 of 4 - PCV20 or PCV21) 03/14/2026   HPV VACCINES (No Doses Required) Completed   HIV Screening  Completed   Meningococcal B Vaccine  Aged Out    -See a dentist at least yearly  -Get your eyes checked and then per your eye specialist's recommendations  -Other issues addressed today:   -I have included below further information regarding a healthy whole foods based diet, physical activity guidelines for adults,  stress management and opportunities for social connections. I hope you find this information useful.   -----------------------------------------------------------------------------------------------------------------------------------------------------------------------------------------------------------------------------------------------------------    NUTRITION: -eat real food: lots of colorful vegetables (half the plate) and fruits -5-7 servings of vegetables and fruits per day (fresh or steamed is best), exp. 2 servings of vegetables with lunch and dinner and 2 servings of fruit per day. Berries and greens such as kale and collards are great choices.  -consume on a regular basis:  fresh fruits, fresh veggies, fish, nuts, seeds, healthy oils (such as olive oil, avocado oil), whole grains (make sure for bread/pasta/crackers/etc., that the first ingredient on label contains the word whole), legumes. -can eat small amounts of dairy and lean meat (no larger than the palm of your hand), but avoid processed meats such as ham, bacon, lunch meat, etc. -drink water -try to avoid fast food and pre-packaged foods, processed meat, ultra processed foods/beverages (donuts, candy, etc.) -most experts advise limiting sodium to < 2300mg  per day, should limit further is any chronic conditions such as high blood pressure, heart disease, diabetes, etc. The American Heart Association advised that < 1500mg  is is ideal -try to avoid foods/beverages that contain any ingredients with names you do not recognize  -try to avoid foods/beverages  with added sugar or sweeteners/sweets  -try to avoid sweet drinks (including diet  drinks): soda, juice, Gatorade, sweet tea, power drinks, diet drinks -try to avoid [place] rice, [place] bread, pasta (unless whole grain)  EXERCISE GUIDELINES FOR ADULTS: -if you wish to increase your physical activity, do so gradually and with the approval of your doctor -STOP and seek medical  care immediately if you have any chest pain, chest discomfort or trouble breathing when starting or increasing exercise  -move and stretch your body, legs, feet and arms when sitting for long periods -Physical activity guidelines for optimal health in adults: -get at least 150 minutes per week of moderate exercise (can talk, but not sing); this is about 20-30 minutes of sustained activity 5-7 days per week or two 10-15 minute episodes of sustained activity 5-7 days per week -do some muscle building/resistance training/strength training at least 2 days per week  -balance exercises 3+ days per week:   Stand somewhere where you have something sturdy to hold onto if you lose balance    1) lift up on toes, then back down, start with 5x per day and work up to 20x   2) stand and lift one leg straight out to the side so that foot is a few inches of the floor, start with 5x each side and work up to 20x each side   3) stand on one foot, start with 5 seconds each side and work up to 20 seconds on each side  If you need ideas or help with getting more active:  -Silver  sneakers https://tools.silversneakers.com  -Walk with a Doc: Http://www.duncan-williams.com/  -try to include resistance (weight lifting/strength building) and balance exercises twice per week: or the following link for ideas: http://castillo-powell.com/  buyducts.dk  STRESS MANAGEMENT: -can try meditating, or just sitting quietly with deep breathing while intentionally relaxing all parts of your body for 5 minutes daily -if you need further help with stress, anxiety or depression please follow up with your primary doctor or contact the wonderful folks at [facility]: [phone]  SOCIAL CONNECTIONS: -options in [place] if you wish to engage in more social and exercise related activities:  -Silver   sneakers https://tools.silversneakers.com  -Walk with a Doc: Http://www.duncan-williams.com/  -Check out the [facility] Active Adults 50+ section on the [place] of [employer] companies (hiking clubs, book clubs, cards and games, chess, exercise classes, aquatic classes and much more) - see the website for details: https://www.[place].gov/departments/parks-recreation/active-adults50  -YouTube has lots of exercise videos for different ages and abilities as well  -[facility] (a variety of indoor and outdoor inperson activities for adults). [phone]. [address]  -Virtual Online Classes (a variety of topics): see seniorplanet.org or call ([phone]  -consider volunteering at a school, hospice center, church, senior center or elsewhere

## 2024-05-17 NOTE — Progress Notes (Signed)
 " ----------------------------------------------------------------------------------------------------------------------------------------------------------------------------------------------------------------------  Because this visit was a virtual/telehealth visit, some criteria may be missing or patient reported. Any vitals not documented were not able to be obtained and vitals that have been documented are patient reported.    MEDICARE ANNUAL PREVENTIVE CARE VISIT WITH PROVIDER (Welcome to Medicare, initial annual wellness or annual wellness exam)  Virtual Visit via Video Note  I connected with Anthony Blanchard on 05/17/24  by phone and verified that I am speaking with the correct person using two identifiers.  Location patient: home, [place], [place] Location provider:work or home office Persons participating in the virtual visit: patient, provider  Concerns and/or follow up today: detailed intake and health/risks assessment completed on flow sheets and below- please see for details. Stable.   How often do you have a drink containing alcohol?never How many drinks containing alcohol do you have on a typical day when you are drinking?na How often do you have six or more drinks on one occasion?na Have you ever smoked?n Quit date if applicable? na  How many packs a day do/did you smoke? na Do you use smokeless tobacco?n Do you use an illicit drugs?n Do you feel safe at home?y Last dentist visit?doesn't go often - is looking for a dentist Last eye Exam and location? Goes next week   See HM section in Epic for other details of completed HM.    ROS: negative for report of fevers, unintentional weight loss, vision changes, vision loss, hearing loss or change, chest pain, sob, hemoptysis, melena, hematochezia, hematuria,bleeding or bruising  Patient-completed extensive health risk assessment - reviewed and discussed with the patient: See Health Risk Assessment completed with patient prior to the  visit either above or in recent phone note. This was reviewed in detailed with the patient today and appropriate recommendations, orders and referrals were placed as needed per Summary below and patient instructions.   Review of Medical History: -PMH, PSH, Family History and current specialty and care providers reviewed and updated and listed below   Patient Care Team: Mercer Clotilda SAUNDERS, MD as PCP - General (Family Medicine) O'Neal, Darryle Ned, MD as PCP - Cardiology (Cardiology)   Past Medical History:  Diagnosis Date   Abnormal results of thyroid  function studies 08/16/2011   Chronic kidney disease    Coronary artery disease    Diabetes mellitus without complication (HCC)    Diabetic foot ulcers (HCC) 10/2019   GERD (gastroesophageal reflux disease)    Glaucoma    Hypertension    Overweight    Partial blindness     Past Surgical History:  Procedure Laterality Date   BONE BIOPSY Bilateral 11/03/2019   Procedure: BONE BIOPSY;  Surgeon: Burt Fus, DPM;  Location: [facility] OR;  Service: Podiatry;  Laterality: Bilateral;   CARDIAC CATHETERIZATION     EYE SURGERY     GRAFT APPLICATION Bilateral 11/03/2019   Procedure: GRAFT APPLICATION;  Surgeon: Burt Fus, DPM;  Location: [facility] OR;  Service: Podiatry;  Laterality: Bilateral;  Integra bilayer 8x10   INCISION AND DRAINAGE OF WOUND Bilateral 11/03/2019   Procedure: IRRIGATION AND DEBRIDEMENT WOUND;  Surgeon: Burt Fus, DPM;  Location: [facility] OR;  Service: Podiatry;  Laterality: Bilateral;   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    Social History   Socioeconomic History   Marital status: Single    Spouse name: Not on file   Number of children: Not on file   Years of education: Not on file   Highest education level: Not on file  Occupational History  Not on file  Tobacco Use   Smoking status: Never   Smokeless tobacco: Never  Vaping Use   Vaping status: Never Used  Substance and Sexual Activity   Alcohol use: Not Currently    Drug use: Never   Sexual activity: Not on file  Other Topics Concern   Not on file  Social History Narrative   Not on file   Social Drivers of Health   Tobacco Use: Low Risk (04/25/2024)   Patient History    Smoking Tobacco Use: Never    Smokeless Tobacco Use: Never    Passive Exposure: Not on file  Financial Resource Strain: Not on file  Food Insecurity: Not on file  Transportation Needs: Not on file  Physical Activity: Insufficiently Active (05/17/2024)   Exercise Vital Sign    Days of Exercise per Week: 5 days    Minutes of Exercise per Session: 10 min  Stress: No Stress Concern Present (05/17/2024)   [employer] of Occupational Health - Occupational Stress Questionnaire    Feeling of Stress: Not at all  Social Connections: Unknown (05/17/2024)   Social Connection and Isolation Panel    Frequency of Communication with Friends and Family: More than three times a week    Frequency of Social Gatherings with Friends and Family: Twice a week    Attends Religious Services: More than 4 times per year    Active Member of [employer] or Organizations: No    Attends Banker Meetings: Never    Marital Status: Patient declined  Intimate Partner Violence: Unknown (07/24/2021)   Received from [facility]   HITS    Physically Hurt: Not on file    Insult or Talk Down To: Not on file    Threaten Physical Harm: Not on file    Scream or Curse: Not on file  Depression (PHQ2-9): Low Risk (05/17/2024)   Depression (PHQ2-9)    PHQ-2 Score: 0  Alcohol Screen: Not on file  Housing: Not on file  Utilities: Not on file  Health Literacy: Not on file    Family History  Problem Relation Age of Onset   Diabetes Mother    Alcohol abuse Father     Medications Ordered Prior to Encounter[1]  Allergies[2]     Physical Exam Vitals requested from patient and listed below if patient had equipment and was able to obtain at home for this virtual visit: There were no vitals filed for  this visit. Estimated body mass index is 35.41 kg/m as calculated from the following:   Height as of 04/25/24: 5' 9 (1.753 m).   Weight as of 04/25/24: 239 lb 12.8 oz (108.8 kg).  EKG (optional): deferred due to virtual visit  GENERAL: alert, oriented, no acute distress detected; full vision exam deferred due to pandemic and/or virtual encounter  HEENT: atraumatic, conjunttiva clear, no obvious abnormalities on inspection of external nose and ears  NECK: normal movements of the head and neck  LUNGS: on inspection no signs of respiratory distress, breathing rate appears normal, no obvious gross SOB, gasping or wheezing  CV: no obvious cyanosis  MS: moves all visible extremities without noticeable abnormality  PSYCH/NEURO: pleasant and cooperative, no obvious depression or anxiety, speech and thought processing grossly intact, Cognitive function grossly intact  Flowsheet Row Office Visit from [facility]/25/2024 [facility] Total Score 1        05/17/2024    3:23 PM 04/25/2024   11:04 AM 02/24/2024  2:25 PM  Depression screen PHQ 2/9  Decreased Interest 0 0 0  Down, Depressed, Hopeless 0 0 0  PHQ - 2 Score 0 0 0  Altered sleeping  0 1  Tired, decreased energy  0 1  Change in appetite  1 1  Feeling bad or failure about yourself   0 1  Trouble concentrating  0 0  Moving slowly or fidgety/restless  0 0  Suicidal thoughts  0 0  PHQ-9 Score  1 4  Difficult doing work/chores  Not difficult at all Not difficult at all       11/04/2019    9:00 PM 11/05/2019    8:30 AM 02/24/2024    2:25 PM 04/25/2024   11:04 AM 05/17/2024    3:06 PM  Fall Risk  Falls in the past year?   0 0 0  Was there an injury with Fall?   0  0 0  Fall Risk Category Calculator   0 0 0  (RETIRED) Patient Fall Risk Level Moderate fall risk  Moderate fall risk      Patient at Risk for Falls Due to     No Fall Risks  Fall risk Follow up   Falls evaluation completed  Falls  evaluation completed     Data saved with a previous flowsheet row definition     SUMMARY AND PLAN:  Encounter for Medicare annual wellness exam   Discussed applicable health maintenance/preventive health measures and advised and referred or ordered per patient preferences: -he declined colon cancer screening -discussed vaccines due recs/risks, advised he can get at the pharmacy, advised if he gets any to let Dr. Mercer know -advised foot exam and labs at next office visit Health Maintenance  Topic Date Due   Hepatitis C Screening  Never done   DTaP/Tdap/Td (1 - Tdap) Never done   Hepatitis B Vaccines 19-59 Average Risk (1 of 3 - 19+ 3-dose series) Never done   Influenza Vaccine  11/18/2023   OPHTHALMOLOGY EXAM  12/02/2023   COVID-19 Vaccine (4 - 2025-26 season) 12/19/2023   Medicare Annual Wellness (AWV)  02/02/2024   Diabetic kidney evaluation - eGFR measurement  03/10/2024   FOOT EXAM  03/15/2024   Colonoscopy  05/17/2025 (Originally 03/14/2021)   Diabetic kidney evaluation - Urine ACR  08/23/2024   HEMOGLOBIN A1C  08/23/2024   Pneumococcal Vaccine (4 of 4 - PCV20 or PCV21) 03/14/2026   HPV VACCINES (No Doses Required) Completed   HIV Screening  Completed   Meningococcal B Vaccine  Aged Out     Education and counseling on the following was provided based on the above review of health and a plan/checklist for the patient, along with additional information discussed, was provided for the patient in the patient instructions :  -Advised on importance of completing advanced directives, he declined more information -Advised and counseled on a healthy lifestyle - including the importance of a healthy diet, regular physical activity, social connections and stress management. -Reviewed patient's current diet. Advised and counseled on a whole foods based healthy diet. He seems interested in eating healthier and encouraged to eat more healthy veggies, less sweets and ultra-processed  drinks and foods and discussed how to spot these. A summary of a healthy diet was provided in the Patient Instructions.  -reviewed patient's current physical activity level and discussed exercise guidelines for adults. Discussed community resources and ideas for safe exercise at home to assist in meeting exercise guideline recommendations in a safe and healthy way.  -  Advise yearly dental visits at minimum and regular eye exams   Follow up: see patient instructions   Patient Instructions  I really enjoyed getting to talk with you today! I am available on Tuesdays and Thursdays for virtual visits if you have any questions or concerns, or if I can be of any further assistance.   CHECKLIST FROM ANNUAL WELLNESS VISIT:  -Follow up (please call to schedule if not scheduled after visit):   -yearly for annual wellness visit with primary care office  Here is a list of your preventive care/health maintenance measures and the plan for each if any are due:  PLAN For any measures below that may be due:    1. Can get vaccines at the pharmacy. Can get some at the office - just call to check and to schedule visit.  Please let us  know if you do so that we can update your record.    2. If you change your mind about colon cancer screening please let us  know.   3. Can do foot exam and labs at next in office visit.    3. Please have your eye doctor send the eye report to Dr. Mercer.   Health Maintenance  Topic Date Due   Hepatitis C Screening  Never done   DTaP/Tdap/Td (1 - Tdap) Never done   Hepatitis B Vaccines 19-59 Average Risk (1 of 3 - 19+ 3-dose series) Never done   Influenza Vaccine  11/18/2023   OPHTHALMOLOGY EXAM  12/02/2023   COVID-19 Vaccine (4 - 2025-26 season) 12/19/2023   Medicare Annual Wellness (AWV)  02/02/2024   Diabetic kidney evaluation - eGFR measurement  03/10/2024   FOOT EXAM  03/15/2024   Colonoscopy  05/17/2025 (Originally 03/14/2021)   Diabetic kidney evaluation - Urine  ACR  08/23/2024   HEMOGLOBIN A1C  08/23/2024   Pneumococcal Vaccine (4 of 4 - PCV20 or PCV21) 03/14/2026   HPV VACCINES (No Doses Required) Completed   HIV Screening  Completed   Meningococcal B Vaccine  Aged Out    -See a dentist at least yearly  -Get your eyes checked and then per your eye specialist's recommendations  -Other issues addressed today:   -I have included below further information regarding a healthy whole foods based diet, physical activity guidelines for adults, stress management and opportunities for social connections. I hope you find this information useful.   -----------------------------------------------------------------------------------------------------------------------------------------------------------------------------------------------------------------------------------------------------------    NUTRITION: -eat real food: lots of colorful vegetables (half the plate) and fruits -5-7 servings of vegetables and fruits per day (fresh or steamed is best), exp. 2 servings of vegetables with lunch and dinner and 2 servings of fruit per day. Berries and greens such as kale and collards are great choices.  -consume on a regular basis:  fresh fruits, fresh veggies, fish, nuts, seeds, healthy oils (such as olive oil, avocado oil), whole grains (make sure for bread/pasta/crackers/etc., that the first ingredient on label contains the word whole), legumes. -can eat small amounts of dairy and lean meat (no larger than the palm of your hand), but avoid processed meats such as ham, bacon, lunch meat, etc. -drink water -try to avoid fast food and pre-packaged foods, processed meat, ultra processed foods/beverages (donuts, candy, etc.) -most experts advise limiting sodium to < 2300mg  per day, should limit further is any chronic conditions such as high blood pressure, heart disease, diabetes, etc. The American Heart Association advised that < 1500mg  is is ideal -try to  avoid foods/beverages that contain any ingredients with names  you do not recognize  -try to avoid foods/beverages  with added sugar or sweeteners/sweets  -try to avoid sweet drinks (including diet drinks): soda, juice, Gatorade, sweet tea, power drinks, diet drinks -try to avoid [place] rice, [place] bread, pasta (unless whole grain)  EXERCISE GUIDELINES FOR ADULTS: -if you wish to increase your physical activity, do so gradually and with the approval of your doctor -STOP and seek medical care immediately if you have any chest pain, chest discomfort or trouble breathing when starting or increasing exercise  -move and stretch your body, legs, feet and arms when sitting for long periods -Physical activity guidelines for optimal health in adults: -get at least 150 minutes per week of moderate exercise (can talk, but not sing); this is about 20-30 minutes of sustained activity 5-7 days per week or two 10-15 minute episodes of sustained activity 5-7 days per week -do some muscle building/resistance training/strength training at least 2 days per week  -balance exercises 3+ days per week:   Stand somewhere where you have something sturdy to hold onto if you lose balance    1) lift up on toes, then back down, start with 5x per day and work up to 20x   2) stand and lift one leg straight out to the side so that foot is a few inches of the floor, start with 5x each side and work up to 20x each side   3) stand on one foot, start with 5 seconds each side and work up to 20 seconds on each side  If you need ideas or help with getting more active:  -Silver  sneakers https://tools.silversneakers.com  -Walk with a Doc: Http://www.duncan-williams.com/  -try to include resistance (weight lifting/strength building) and balance exercises twice per week: or the following link for  ideas: http://castillo-powell.com/  buyducts.dk  STRESS MANAGEMENT: -can try meditating, or just sitting quietly with deep breathing while intentionally relaxing all parts of your body for 5 minutes daily -if you need further help with stress, anxiety or depression please follow up with your primary doctor or contact the wonderful folks at [facility]: [phone]  SOCIAL CONNECTIONS: -options in [place] if you wish to engage in more social and exercise related activities:  -Silver  sneakers https://tools.silversneakers.com  -Walk with a Doc: Http://www.duncan-williams.com/  -Check out the [facility] Active Adults 50+ section on the [place] [employer] (hiking clubs, book clubs, cards and games, chess, exercise classes, aquatic classes and much more) - see the website for details: https://www.[place].gov/departments/parks-recreation/active-adults50  -YouTube has lots of exercise videos for different ages and abilities as well  -[facility] (a variety of indoor and outdoor inperson activities for adults). 5730289940. [address]  -Virtual Online Classes (a variety of topics): see seniorplanet.org or call [phone]  -consider volunteering at a school, hospice center, church, senior center or elsewhere            Chiquita JONELLE Cramp, DO     [1]  Current Outpatient Medications on File Prior to Visit  Medication Sig Dispense Refill   acetaminophen  (TYLENOL ) 500 MG tablet Take 500 mg by mouth daily.     aspirin  EC 81 MG tablet Take 81 mg by mouth.     atorvastatin  (LIPITOR) 40 MG tablet Take 1 tablet (40 mg total) by mouth daily. 90 tablet 3   Blood Glucose Monitoring Suppl (FIFTY50 GLUCOSE METER 2.0) w/Device KIT      Blood Glucose Monitoring Suppl DEVI 1 each by Does not apply route in the morning, at  noon, and at bedtime. May substitute  to any manufacturer covered by patient's insurance. 1 each 0   Continuous Glucose Sensor (DEXCOM G7 SENSOR) MISC USE AS DIRECTED, CHANGE SENSOR EVERY 10 DAYS 9 each 0   dorzolamide -timolol  (COSOPT ) 22.3-6.8 MG/ML ophthalmic solution Place 1 drop into the left eye 2 (two) times daily.      Glucagon  (BAQSIMI  ONE PACK) 3 MG/DOSE POWD Place 1 Device into the nose as needed (Low blood sugar with impaired consciousness). 2 each 3   insulin  glargine (LANTUS ) 100 UNIT/ML injection Inject 0.3 mLs (30 Units total) into the skin at bedtime. 10 mL 11   metoprolol  tartrate (LOPRESSOR ) 25 MG tablet Take 0.5 tablets (12.5 mg total) by mouth 2 (two) times daily. 30 tablet 1   metoprolol  tartrate (LOPRESSOR ) 50 MG tablet Take 50 mg by mouth daily. 1 and 1/2 tabs 2 times a day     moxifloxacin (VIGAMOX) 0.5 % ophthalmic solution Apply 1 drop to eye 4 (four) times daily.     mycophenolate  (MYFORTIC ) 180 MG EC tablet Take 540 mg by mouth 2 (two) times daily.     NOVOLOG  FLEXPEN 100 UNIT/ML FlexPen Inject 4-8 Units into the skin See admin instructions. 6 units at breakfast, 8 units at lunch and 6 units in the afternoon     omeprazole (PRILOSEC) 20 MG capsule Take 20 mg by mouth daily.      Semaglutide , 1 MG/DOSE, 4 MG/3ML SOPN Inject 1 mg as directed once a week. 3 mL 0   SIMBRINZA 1-0.2 % SUSP Place 1 drop into the left eye 2 (two) times daily.     tacrolimus  (PROGRAF ) 1 MG capsule Take 6 mg by mouth in the morning and at bedtime.      ULTICARE MICRO PEN NEEDLES 32G X 4 MM MISC      No current facility-administered medications on file prior to visit.  [2]  Allergies Allergen Reactions   Shellfish Allergy Anaphylaxis   Shellfish Protein-Containing Drug Products Anaphylaxis   Sulfa Antibiotics Anaphylaxis    Other reaction(s): Other (See Comments)   Iodinated Contrast Media Other (See Comments)    Other reaction(s): Other (see comments)   Iodine Other (See Comments)    Other reaction(s): Other (see comments)    "

## 2024-05-24 ENCOUNTER — Ambulatory Visit: Admitting: Cardiology

## 2024-05-24 ENCOUNTER — Encounter: Payer: Self-pay | Admitting: Cardiology

## 2024-05-24 VITALS — BP 122/82 | HR 75 | Ht 69.0 in | Wt 241.0 lb

## 2024-05-24 DIAGNOSIS — E118 Type 2 diabetes mellitus with unspecified complications: Secondary | ICD-10-CM | POA: Diagnosis not present

## 2024-05-24 DIAGNOSIS — I10 Essential (primary) hypertension: Secondary | ICD-10-CM | POA: Diagnosis not present

## 2024-05-24 DIAGNOSIS — Z794 Long term (current) use of insulin: Secondary | ICD-10-CM

## 2024-05-24 DIAGNOSIS — E1169 Type 2 diabetes mellitus with other specified complication: Secondary | ICD-10-CM

## 2024-05-24 DIAGNOSIS — I25118 Atherosclerotic heart disease of native coronary artery with other forms of angina pectoris: Secondary | ICD-10-CM

## 2024-05-24 NOTE — Patient Instructions (Addendum)
 Medication Instructions:  Continue all medications *If you need a refill on your cardiac medications before your next appointment, please call your pharmacy*  Lab Work: None ordered  Testing/Procedures:   Follow-Up: At [facility], you and your health needs are our priority.  As part of our continuing mission to provide you with exceptional heart care, our providers are all part of one team.  This team includes your primary Cardiologist (physician) and Advanced Practice Providers or APPs (Physician Assistants and Nurse Practitioners) who all work together to provide you with the care you need, when you need it.  Your next appointment:  1 year   Call in Nov to schedule Feb appointment     Provider:  Dr.Jordan   We recommend signing up for the patient portal called MyChart.  Sign up information is provided on this After Visit Summary.  MyChart is used to connect with patients for Virtual Visits (Telemedicine).  Patients are able to view lab/test results, encounter notes, upcoming appointments, etc.  Non-urgent messages can be sent to your provider as well.   To learn more about what you can do with MyChart, go to [email or web address].

## 2024-06-06 ENCOUNTER — Ambulatory Visit: Payer: Self-pay | Attending: Family Medicine | Admitting: Occupational Therapy

## 2024-06-07 ENCOUNTER — Ambulatory Visit: Admitting: Podiatry

## 2024-08-21 ENCOUNTER — Ambulatory Visit: Admitting: Endocrinology

## 2024-08-23 ENCOUNTER — Ambulatory Visit: Admitting: Family Medicine
